# Patient Record
Sex: Male | Born: 1945 | Race: White | Hispanic: No | Marital: Married | Smoking: Former smoker
Health system: Southern US, Community
[De-identification: ages and names within clinical notes are randomized; demographics above are authoritative.]

## PROBLEM LIST (undated history)

## (undated) DIAGNOSIS — I350 Nonrheumatic aortic (valve) stenosis: Secondary | ICD-10-CM

## (undated) DIAGNOSIS — E669 Obesity, unspecified: Secondary | ICD-10-CM

## (undated) DIAGNOSIS — R011 Cardiac murmur, unspecified: Secondary | ICD-10-CM

## (undated) DIAGNOSIS — E7800 Pure hypercholesterolemia, unspecified: Secondary | ICD-10-CM

## (undated) DIAGNOSIS — Z0000 Encounter for general adult medical examination without abnormal findings: Secondary | ICD-10-CM

## (undated) DIAGNOSIS — L821 Other seborrheic keratosis: Secondary | ICD-10-CM

## (undated) DIAGNOSIS — H9313 Tinnitus, bilateral: Secondary | ICD-10-CM

## (undated) DIAGNOSIS — I447 Left bundle-branch block, unspecified: Secondary | ICD-10-CM

## (undated) DIAGNOSIS — D696 Thrombocytopenia, unspecified: Secondary | ICD-10-CM

## (undated) DIAGNOSIS — L03116 Cellulitis of left lower limb: Secondary | ICD-10-CM

## (undated) DIAGNOSIS — G459 Transient cerebral ischemic attack, unspecified: Secondary | ICD-10-CM

## (undated) DIAGNOSIS — I10 Essential (primary) hypertension: Secondary | ICD-10-CM

## (undated) DIAGNOSIS — M4820 Kissing spine, site unspecified: Secondary | ICD-10-CM

## (undated) DIAGNOSIS — R600 Localized edema: Secondary | ICD-10-CM

## (undated) DIAGNOSIS — Z23 Encounter for immunization: Secondary | ICD-10-CM

## (undated) DIAGNOSIS — M545 Low back pain: Secondary | ICD-10-CM

## (undated) DIAGNOSIS — I872 Venous insufficiency (chronic) (peripheral): Secondary | ICD-10-CM

## (undated) HISTORY — DX: Encounter for immunization: Z23

## (undated) HISTORY — DX: Left bundle-branch block, unspecified: I44.7

## (undated) HISTORY — DX: Essential (primary) hypertension: I10

## (undated) HISTORY — DX: Transient cerebral ischemic attack, unspecified: G45.9

## (undated) HISTORY — DX: Encounter for general adult medical examination without abnormal findings: Z00.00

## (undated) HISTORY — DX: Tinnitus, bilateral: H93.13

## (undated) HISTORY — PX: ESOPHAGOGASTRODUODENOSCOPY: SHX1529

## (undated) HISTORY — DX: Venous insufficiency (chronic) (peripheral): I87.2

## (undated) HISTORY — DX: Thrombocytopenia, unspecified: D69.6

## (undated) HISTORY — DX: Other seborrheic keratosis: L82.1

## (undated) HISTORY — DX: Pure hypercholesterolemia, unspecified: E78.00

## (undated) HISTORY — PX: CORONARY ANGIOPLASTY: SHX604

## (undated) HISTORY — DX: Cardiac murmur, unspecified: R01.1

## (undated) HISTORY — PX: CHOLECYSTECTOMY: SHX55

## (undated) HISTORY — DX: Cellulitis of left lower limb: L03.116

## (undated) HISTORY — DX: Localized edema: R60.0

## (undated) HISTORY — DX: Kissing spine, site unspecified: M48.20

## (undated) HISTORY — DX: Low back pain: M54.5

## (undated) HISTORY — DX: Nonrheumatic aortic (valve) stenosis: I35.0

## (undated) HISTORY — DX: Obesity, unspecified: E66.9

## (undated) HISTORY — PX: CARDIAC CATHETERIZATION: SHX172

---

## 2015-05-24 DIAGNOSIS — E7800 Pure hypercholesterolemia, unspecified: Secondary | ICD-10-CM

## 2015-05-24 HISTORY — DX: Pure hypercholesterolemia, unspecified: E78.00

## 2015-08-10 DIAGNOSIS — I350 Nonrheumatic aortic (valve) stenosis: Secondary | ICD-10-CM | POA: Insufficient documentation

## 2015-08-10 DIAGNOSIS — E66812 Obesity, class 2: Secondary | ICD-10-CM

## 2015-08-10 DIAGNOSIS — I872 Venous insufficiency (chronic) (peripheral): Secondary | ICD-10-CM

## 2015-08-10 DIAGNOSIS — I351 Nonrheumatic aortic (valve) insufficiency: Secondary | ICD-10-CM

## 2015-08-10 DIAGNOSIS — E669 Obesity, unspecified: Secondary | ICD-10-CM

## 2015-08-10 HISTORY — DX: Obesity, class 2: E66.812

## 2015-08-10 HISTORY — DX: Venous insufficiency (chronic) (peripheral): I87.2

## 2015-08-10 HISTORY — DX: Nonrheumatic aortic (valve) stenosis: I35.0

## 2015-08-10 HISTORY — DX: Nonrheumatic aortic (valve) insufficiency: I35.1

## 2015-08-10 HISTORY — DX: Obesity, unspecified: E66.9

## 2015-08-13 DIAGNOSIS — I10 Essential (primary) hypertension: Secondary | ICD-10-CM

## 2015-08-13 HISTORY — DX: Essential (primary) hypertension: I10

## 2015-08-22 DIAGNOSIS — M545 Low back pain: Secondary | ICD-10-CM | POA: Insufficient documentation

## 2015-08-22 DIAGNOSIS — M4820 Kissing spine, site unspecified: Secondary | ICD-10-CM

## 2015-08-22 DIAGNOSIS — M5459 Other low back pain: Secondary | ICD-10-CM | POA: Insufficient documentation

## 2015-08-22 DIAGNOSIS — R600 Localized edema: Secondary | ICD-10-CM

## 2015-08-22 DIAGNOSIS — I447 Left bundle-branch block, unspecified: Secondary | ICD-10-CM | POA: Insufficient documentation

## 2015-08-22 DIAGNOSIS — H9313 Tinnitus, bilateral: Secondary | ICD-10-CM

## 2015-08-22 HISTORY — DX: Localized edema: R60.0

## 2015-08-22 HISTORY — DX: Kissing spine, site unspecified: M48.20

## 2015-08-22 HISTORY — DX: Other low back pain: M54.59

## 2015-08-22 HISTORY — DX: Tinnitus, bilateral: H93.13

## 2015-08-22 HISTORY — DX: Left bundle-branch block, unspecified: I44.7

## 2016-06-02 DIAGNOSIS — Z0000 Encounter for general adult medical examination without abnormal findings: Secondary | ICD-10-CM

## 2016-06-02 HISTORY — DX: Encounter for general adult medical examination without abnormal findings: Z00.00

## 2016-12-19 DIAGNOSIS — G459 Transient cerebral ischemic attack, unspecified: Secondary | ICD-10-CM | POA: Insufficient documentation

## 2016-12-19 HISTORY — DX: Transient cerebral ischemic attack, unspecified: G45.9

## 2017-03-27 DIAGNOSIS — Z23 Encounter for immunization: Secondary | ICD-10-CM

## 2017-03-27 DIAGNOSIS — L821 Other seborrheic keratosis: Secondary | ICD-10-CM

## 2017-03-27 HISTORY — DX: Other seborrheic keratosis: L82.1

## 2017-03-27 HISTORY — DX: Encounter for immunization: Z23

## 2017-04-29 DIAGNOSIS — R011 Cardiac murmur, unspecified: Secondary | ICD-10-CM | POA: Insufficient documentation

## 2017-04-29 DIAGNOSIS — L03116 Cellulitis of left lower limb: Secondary | ICD-10-CM

## 2017-04-29 DIAGNOSIS — D696 Thrombocytopenia, unspecified: Secondary | ICD-10-CM

## 2017-04-29 HISTORY — DX: Cardiac murmur, unspecified: R01.1

## 2017-04-29 HISTORY — DX: Thrombocytopenia, unspecified: D69.6

## 2017-04-29 HISTORY — DX: Cellulitis of left lower limb: L03.116

## 2017-05-04 ENCOUNTER — Encounter: Payer: Self-pay | Admitting: Cardiology

## 2017-05-04 ENCOUNTER — Ambulatory Visit: Payer: Medicare Other | Admitting: Cardiology

## 2017-05-04 VITALS — BP 150/86 | HR 53 | Ht 69.0 in | Wt 262.0 lb

## 2017-05-04 DIAGNOSIS — R0609 Other forms of dyspnea: Secondary | ICD-10-CM

## 2017-05-04 DIAGNOSIS — R011 Cardiac murmur, unspecified: Secondary | ICD-10-CM

## 2017-05-04 DIAGNOSIS — E785 Hyperlipidemia, unspecified: Secondary | ICD-10-CM

## 2017-05-04 DIAGNOSIS — R001 Bradycardia, unspecified: Secondary | ICD-10-CM

## 2017-05-04 DIAGNOSIS — I10 Essential (primary) hypertension: Secondary | ICD-10-CM | POA: Diagnosis not present

## 2017-05-04 DIAGNOSIS — I447 Left bundle-branch block, unspecified: Secondary | ICD-10-CM

## 2017-05-04 DIAGNOSIS — R0600 Dyspnea, unspecified: Secondary | ICD-10-CM

## 2017-05-04 HISTORY — DX: Dyspnea, unspecified: R06.00

## 2017-05-04 HISTORY — DX: Other forms of dyspnea: R06.09

## 2017-05-04 MED ORDER — PRAVASTATIN SODIUM 20 MG PO TABS: 20.0000 mg | ORAL_TABLET | Freq: Every evening | ORAL | 3 refills | Status: DC

## 2017-05-04 MED ORDER — CARVEDILOL 12.5 MG PO TABS: 6.2500 mg | ORAL_TABLET | Freq: Two times a day (BID) | ORAL | 3 refills | Status: DC

## 2017-05-04 NOTE — Progress Notes (Signed)
Cardiology Office Note:    Date:  05/04/2017   ID:  Craig Lawson, DOB [birth year], MRN [id number]  PCP:  Tarri Fuller, MD  Cardiologist:  Norman Herrlich, MD   Referring MD: Tarri Fuller, MD  ASSESSMENT:    1. Murmur   2. Bradycardia   3. LBBB (left bundle branch block)   4. Essential hypertension   5. DOE (dyspnea on exertion)   6. Hyperlipidemia, unspecified hyperlipidemia type    PLAN:    In order of problems listed above:  1. His exam is consistent with aortic stenosis moderate and there is discordance on the echocardiogram 2016 mild stenosis in September 2018 no aortic stenosis.  Because of symptoms he will undergo repeat echocardiogram. 2. He is symptomatic with exercise intolerance he has resting bradycardia heart rate of 49 his beta-blocker dose will be decreased by 50% and plan a Holter monitor in 2 weeks to assess heart rate.  If he remains bradycardic we will need to consider discontinuing a beta-blocker 3. At risk for left ventricular dysfunction CAD echocardiogram myocardial perfusion study ordered. 4. Discontinue Coreg 5. Ischemia evaluation is ordered with his exertional shortness of breath and exercise intolerance 6. I will switch him to a low-dose of a low intensity statin pravastatin daily with his severe residual elevated LDL on once weekly low-dose atorvastatin.  Next appointment 4 weeks   Medication Adjustments/Labs and Tests Ordered: Current medicines are reviewed at length with the patient today.  Concerns regarding medicines are outlined above.  Orders Placed This Encounter  Procedures  . Myocardial Perfusion Imaging  . Holter monitor - 48 hour  . EKG 12-Lead  . ECHOCARDIOGRAM COMPLETE   Meds ordered this encounter  Medications  . carvedilol (COREG) 12.5 MG tablet    Sig: Take 0.5 tablets (6.25 mg total) by mouth 2 (two) times daily with a meal.    Dispense:  60 tablet    Refill:  3  . pravastatin (PRAVACHOL) 20 MG tablet    Sig: Take 1  tablet (20 mg total) by mouth every evening.    Dispense:  30 tablet    Refill:  3     Chief Complaint  Patient presents with  . Establish Care    Aortic regurgitation  4 weeks  History of Present Illness:    Craig Lawson is a 72 y.o. male with a history of hypertension, LBBB and mild AR who is being seen today for the evaluation of valvular disease at the request of Tarri Fuller, MD.His echo in 2016 showed AV gradient, mild AS  He relates that he had a home visit with a nurse practitioner through his insurance she commented that his heart rate was slow and that his oxygen saturation was 90%.  He previously seen a cardiologist at [facility] 2016 and 2017 for mild aortic stenosis.  An echocardiogram in the last year at [facility] showed no gradient.  He relates he short of breath or more than usual activities such as climbing stairs but no edema orthopnea PND chest pain no recent palpitation or syncope.  He was admitted to the hospital in September for what was called TIA with transient aphasia and relates that afterwards he had a brief episode with dizziness that was similar but was brief and did not seek medical attention.  He has no history of congenital or rheumatic heart disease he thinks about 10 years ago he had a stress test performed Past Medical History:  Diagnosis Date  .  Aortic stenosis, mild 08/10/2015  . Bilateral leg edema 08/22/2015  . Cellulitis of left lower extremity 04/29/2017  . Essential hypertension 08/13/2015  . Heart murmur 04/29/2017  . Kissing, osteophytes 08/22/2015  . LBBB (left bundle branch block) 08/22/2015  . Lumbar facet joint pain 08/22/2015  . Medicare annual wellness visit, subsequent 06/02/2016  . Need for vaccination for Strep pneumoniae 03/27/2017  . Obesity, Class II, BMI 35-39.9 08/10/2015  . Pure hypercholesterolemia 05/24/2015  . Seborrheic keratoses 03/27/2017   Overview:  Of the right forehead.  . Thrombocytopenia (HCC) 04/29/2017    . TIA (transient ischemic attack) 12/19/2016  . 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 Venous insufficiency 08/10/2015    Past Surgical History:  Procedure Laterality Date  . CHOLECYSTECTOMY    . ESOPHAGOGASTRODUODENOSCOPY      Current Medications: Current Meds  Medication Sig  . aspirin 325 MG tablet Take by mouth.  . carvedilol (COREG) 12.5 MG tablet Take 0.5 tablets (6.25 mg total) by mouth 2 (two) times daily with a meal.  . hydrochlorothiazide (HYDRODIURIL) 25 MG tablet Take 25 mg by mouth.  . Multiple Vitamin (MULTIVITAMIN) tablet Take 1 tablet by mouth daily.  . [DISCONTINUED] atorvastatin (LIPITOR) 10 MG tablet Take one statin tab by mouth weekly.  . [DISCONTINUED] carvedilol (COREG) 12.5 MG tablet Take by mouth.     Allergies:   Statins he was unable to tolerate a high dose of a high intensity statin  Social History   Socioeconomic History  . Marital status: Married    Spouse name: None  . Number of children: None  . Years of education: None  . Highest education level: None  Social Needs  . Financial resource strain: None  . Food insecurity - worry: None  . Food insecurity - inability: None  . Transportation needs - medical: None  . Transportation needs - non-medical: None  Occupational History  . None  Tobacco Use  . Smoking status: Former Games developermoker  . Smokeless tobacco: Never Used  Substance and Sexual Activity  . Alcohol use: Yes    Alcohol/week: 4.2 oz    Types: 7 Glasses of wine per week  . Drug use: No  . Sexual activity: None  Other Topics Concern  . None  Social History Narrative  . None     Family History: The patient's family history includes Cancer in his mother; Diabetes in his brother and sister; Hypertension in his brother.  ROS:   Review of Systems  Constitution: Negative.  HENT: Negative.   Eyes: Negative.   Cardiovascular: Positive for dyspnea on exertion.  Respiratory: Positive for shortness of breath.   Endocrine: Negative.    Hematologic/Lymphatic: Negative.   Skin: Negative.   Musculoskeletal: Positive for back pain.  Gastrointestinal: Negative.   Genitourinary: Negative.   Neurological: Negative.   Psychiatric/Behavioral: Negative.   Allergic/Immunologic: Negative.    Please see the history of present illness.     All other systems reviewed and are negative.  EKGs/Labs/Other Studies Reviewed:    The following studies were reviewed today:  Echo TTE 12/20/16 EF 50-55% mild AR. AV Peak Velocity: 136 cm  Carotid duplex  12/19/16: Summary  1-39% stenosis bilaterally in the internal carotid arteries. No significant plaque noted.  MRI brain 12/19/16:  Result Impression  1. No acute intracranial infarct or other process identified. 2. Mild cerebral white matter changes, most likely related to mild chronic small vessel ischemic disease.    EKG:  EKG is  ordered today.  The  ekg ordered today demonstrates SRTH 49 BPM LBBB  Recent Labs: Bili 1.4, CMP otherwise nornmal No results found for requested labs within last 8760 hours.  Recent Lipid Panel 03/27/17: Chol 208, HDL 37, LDL 171 No results found for: CHOL, TRIG, HDL, CHOLHDL, VLDL, LDLCALC, LDLDIRECT  Physical Exam:    VS:  BP (!) 150/86 (BP Location: Left Arm, Patient Position: Sitting, Cuff Size: Normal)   Pulse (!) 53   Ht 5\' 9"  (1.753 m)   Wt 262 lb (118.8 kg)   SpO2 98%   BMI 38.69 kg/m     Wt Readings from Last 3 Encounters:  05/04/17 262 lb (118.8 kg)     GEN:  Well nourished, well developed in no acute distress HEENT: Normal NECK: No JVD; No carotid bruits LYMPHATICS: No lymphadenopathy CARDIAC: 3/6 SEM mid peaking, S2 split, rumble, radiated to carotids bilaterally no AR and full carotid upstroke RRR, no murmurs, rubs, gallops RESPIRATORY:  Clear to auscultation without rales, wheezing or rhonchi  ABDOMEN: Soft, non-tender, non-distended MUSCULOSKELETAL:  No edema; No deformity  SKIN: Warm and dry NEUROLOGIC:  Alert and  oriented x 3 PSYCHIATRIC:  Normal affect     Signed, Norman Herrlich, MD  05/04/2017 2:30 PM    [facility]

## 2017-05-04 NOTE — Patient Instructions (Addendum)
Medication Instructions:  Your physician has recommended you make the following change in your medication:  STOP atorvastatin START pravastatin 20 mg daily  DECREASE carvedilol to 6.25 mg twice daily  Labwork: None  Testing/Procedures: You had an EKG today.  Your physician has requested that you have an echocardiogram. Echocardiography is a painless test that uses sound waves to create images of your heart. It provides your doctor with information about the size and shape of your heart and how well your heart's chambers and valves are working. This procedure takes approximately one hour. There are no restrictions for this procedure.  Your physician has recommended that you wear a holter monitor. Holter monitors are medical devices that record the heart's electrical activity. Doctors most often use these monitors to diagnose arrhythmias. Arrhythmias are problems with the speed or rhythm of the heartbeat. The monitor is a small, portable device. You can wear one while you do your normal daily activities. This is usually used to diagnose what is causing palpitations/syncope (passing out). 48 hours.  Your physician has requested that you have a lexiscan myoview. For further information please visit [email or web address]. Please follow instruction sheet, as given.  Follow-Up: Your physician recommends that you schedule a follow-up appointment in: 4 weeks.  Any Other Special Instructions Will Be Listed Below (If Applicable).     If you need a refill on your cardiac medications before your next appointment, please call your pharmacy.

## 2017-05-11 ENCOUNTER — Telehealth (HOSPITAL_COMMUNITY): Payer: Self-pay | Admitting: *Deleted

## 2017-05-11 NOTE — Telephone Encounter (Signed)
Patient given detailed instructions per Myocardial Perfusion Study Information Sheet for the test on 05/13/17 at 0945. Patient notified to arrive 15 minutes early and that it is imperative to arrive on time for appointment to keep from having the test rescheduled.  If you need to cancel or reschedule your appointment, please call the office within 24 hours of your appointment. . Patient verbalized understanding.Craig Lawson    

## 2017-05-13 ENCOUNTER — Other Ambulatory Visit: Payer: Self-pay

## 2017-05-13 ENCOUNTER — Ambulatory Visit (HOSPITAL_COMMUNITY): Payer: Medicare Other | Attending: Cardiology

## 2017-05-13 ENCOUNTER — Ambulatory Visit (HOSPITAL_BASED_OUTPATIENT_CLINIC_OR_DEPARTMENT_OTHER): Payer: Medicare Other

## 2017-05-13 VITALS — Ht 69.0 in | Wt 262.0 lb

## 2017-05-13 DIAGNOSIS — R011 Cardiac murmur, unspecified: Secondary | ICD-10-CM | POA: Diagnosis not present

## 2017-05-13 DIAGNOSIS — R0609 Other forms of dyspnea: Secondary | ICD-10-CM

## 2017-05-13 DIAGNOSIS — R079 Chest pain, unspecified: Secondary | ICD-10-CM

## 2017-05-13 DIAGNOSIS — I10 Essential (primary) hypertension: Secondary | ICD-10-CM

## 2017-05-13 DIAGNOSIS — I350 Nonrheumatic aortic (valve) stenosis: Secondary | ICD-10-CM | POA: Insufficient documentation

## 2017-05-13 LAB — MYOCARDIAL PERFUSION IMAGING
LV dias vol: 182 mL (ref 62–150)
LV sys vol: 111 mL
Peak HR: 65 {beats}/min
RATE: 0.34
Rest HR: 47 {beats}/min
SDS: 4
SRS: 5
SSS: 9
TID: 1.06

## 2017-05-13 LAB — ECHOCARDIOGRAM COMPLETE
HEIGHTINCHES: 69 in
WEIGHTICAEL: 4192 [oz_av]

## 2017-05-13 MED ORDER — TECHNETIUM TC 99M TETROFOSMIN IV KIT
32.7000 | PACK | Freq: Once | INTRAVENOUS | Status: AC | PRN
  Administered 2017-05-13: 32.7 via INTRAVENOUS
  Filled 2017-05-13: qty 33

## 2017-05-13 MED ORDER — REGADENOSON 0.4 MG/5ML IV SOLN
0.4000 mg | Freq: Once | INTRAVENOUS | Status: AC
  Administered 2017-05-13: 0.4 mg via INTRAVENOUS

## 2017-05-13 MED ORDER — TECHNETIUM TC 99M TETROFOSMIN IV KIT
11.0000 | PACK | Freq: Once | INTRAVENOUS | Status: AC | PRN
  Administered 2017-05-13: 11 via INTRAVENOUS
  Filled 2017-05-13: qty 11

## 2017-05-18 ENCOUNTER — Ambulatory Visit: Payer: Medicare Other

## 2017-05-18 DIAGNOSIS — R001 Bradycardia, unspecified: Secondary | ICD-10-CM

## 2017-05-31 NOTE — Progress Notes (Signed)
Cardiology Office Note:    Date:  06/01/2017   ID:  [name], DOB 08/19/1945, MRN [id number]  PCP:  Tarri Fuller, MD  Cardiologist:  Norman Herrlich, MD    Referring MD: Tarri Fuller, MD    ASSESSMENT:    1. Bradycardia   2. Mild aortic stenosis   3. Mild aortic regurgitation   4. Essential hypertension   5. LBBB (left bundle branch block)    PLAN:    In order of problems listed above:  1. His Holter monitor confirms relative bradycardia he has malaise fatigue exercise intolerance 1 minute he has begin withdrawing carvedilol and switching to alternative therapy for hypertension.  In the past he relates blood pressure is controlled and felt well and ACE inhibitor prior to a cough.  He was start a high intensity ARB return for BP check and BMP in 1 week see me in 4 weeks.   2. Stable on recent echocardiogram 3. Stable and recent echocardiogram 4. Poorly controlled, continue HCTZ add ARB check BP and blood pressure in 1 week and attempt to withdraw and reduce his beta-blocker with malaise fatigue and exercise intolerance 5. Stable his myocardial perfusion study shows no ischemia echocardiogram shows normal left ventricular function.   Next appointment: 4 weeks   Medication Adjustments/Labs and Tests Ordered: Current medicines are reviewed at length with the patient today.  Concerns regarding medicines are outlined above.  No orders of the defined types were placed in this encounter.  No orders of the defined types were placed in this encounter.   Chief Complaint  Patient presents with  . Follow-up    after cardiac testing  . Bradycardia    LBBB AS and AR    History of Present Illness:    Craig Lawson is a 72 y.o. male with a hx of hyperlipidemia, hypertension, LBBB, sinus bradycardia  and mild As and mild AR last seen one month ago.. Compliance with diet, lifestyle and medications: Yes He continues to have malaise fatigue exercise intolerance and shortness of  breath when he walks more than a quarter of a mile.  Previously did well with lisinopril but developed a cough will continue to decrease and try to just discontinue his beta-blocker at a high intensity ARB for blood pressure control recheck blood pressure BMP in 1 week see me in 4 weeks and then afterwards of hypertension is at target and feeling well see yearly for his mild aortic valve disease.  He is intolerant of statins including low intensity with muscle pain and weakness. Past Medical History:  Diagnosis Date  . Aortic stenosis, mild 08/10/2015  . Bilateral leg edema 08/22/2015  . Cellulitis of left lower extremity 04/29/2017  . Essential hypertension 08/13/2015  . Heart murmur 04/29/2017  . Kissing, osteophytes 08/22/2015  . LBBB (left bundle branch block) 08/22/2015  . Lumbar facet joint pain 08/22/2015  . Medicare annual wellness visit, subsequent 06/02/2016  . Need for vaccination for Strep pneumoniae 03/27/2017  . Obesity, Class II, BMI 35-39.9 08/10/2015  . Pure hypercholesterolemia 05/24/2015  . Seborrheic keratoses 03/27/2017   Overview:  Of the right forehead.  . Thrombocytopenia (HCC) 04/29/2017  . TIA (transient ischemic attack) 12/19/2016  . 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 Venous insufficiency 08/10/2015    Past Surgical History:  Procedure Laterality Date  . CHOLECYSTECTOMY    . ESOPHAGOGASTRODUODENOSCOPY      Current Medications: Current Meds  Medication Sig  . aspirin 325 MG tablet Take 325 mg by mouth  daily.   . carvedilol (COREG) 12.5 MG tablet Take 0.5 tablets (6.25 mg total) by mouth 2 (two) times daily with a meal.  . hydrochlorothiazide (HYDRODIURIL) 25 MG tablet Take 25 mg by mouth daily.   . Multiple Vitamin (MULTIVITAMIN) tablet Take 1 tablet by mouth daily.     Allergies:   Statins   Social History   Socioeconomic History  . Marital status: Married    Spouse name: None  . Number of children: None  . Years of education: None  . Highest education level: None    Social Needs  . Financial resource strain: None  . Food insecurity - worry: None  . Food insecurity - inability: None  . Transportation needs - medical: None  . Transportation needs - non-medical: None  Occupational History  . None  Tobacco Use  . Smoking status: Former Games developer  . Smokeless tobacco: Never Used  Substance and Sexual Activity  . Alcohol use: Yes    Alcohol/week: 4.2 oz    Types: 7 Glasses of wine per week  . Drug use: No  . Sexual activity: None  Other Topics Concern  . None  Social History Narrative  . None     Family History: The patient's family history includes Cancer in his mother; Diabetes in his brother and sister; Hypertension in his brother. ROS:   Please see the history of present illness.    All other systems reviewed and are negative.  EKGs/Labs/Other Studies Reviewed:    The following studies were reviewed today:  Echo TTE 05/13/17:  - Left ventricle: The cavity size was normal. Wall thickness was   increased in a pattern of moderate LVH. Systolic function was   normal. The estimated ejection fraction was in the range of 60%   to 65%. Wall motion was normal; there were no regional wall   motion abnormalities. Doppler parameters are consistent with   abnormal left ventricular relaxation (grade 1 diastolic   dysfunction). - Aortic valve: Mildly to moderately calcified annulus. Moderately   thickened, moderately calcified leaflets. There was mild   stenosis. There was trivial regurgitation.  MPI:  Perfusion Summary Small anterseptal infarct in setting of LBBB no ischemia Diffuse hypokinesis EF estimated at 39% suggest echo/MRI correlation  Overall Study Impression Myocardial perfusion is abnormal. This is an intermediate risk study. Overall left ventricular systolic function was abnormal. LV cavity size is mildly enlarged. Nuclear stress EF: 39%. The left ventricular ejection fraction is moderately decreased (30-44%).    Holter  monitor: Date of test:                 05/18/2017 Duration of test:           48 hours Indication:                    Bradycardia Ordering physician:       Dr. Watt Climes Interpreting physician:   Dr. Dulce Sellar Baseline rhythm: Sinus rhythm wide QRS 160 ms appears left bundle branch block Minimum heart rate 43 average heart rate 53 maximal heart rate 95 bpm Atrial arrhythmia: 309 single APCs are seen with 3 brief runs and rates of 90-104 bpm the longest 10 beats in duration.  There were no episodes of atrial fibrillation Ventricular arrhythmia: 28 single PVCs Conduction abnormality: Sinus bradycardia slowest sustained rate 46 bpm.  There are no episodes of sinus node or AV nodal block QT interval: Normal 467 ms Symptoms: None Conclusion: Sinus bradycardia left bundle branch block  and occasional atrial and ventricular premature beats.  Recent Labs: No results found for requested labs within last 8760 hours.  Recent Lipid Panel No results found for: CHOL, TRIG, HDL, CHOLHDL, VLDL, LDLCALC, LDLDIRECT  Physical Exam:    VS:  BP (!) 168/92 (BP Location: Right Arm, Patient Position: Sitting, Cuff Size: Large)   Pulse (!) 51   Ht 5\' 9"  (1.753 m)   Wt 260 lb (117.9 kg)   SpO2 97%   BMI 38.40 kg/m     Wt Readings from Last 3 Encounters:  06/01/17 260 lb (117.9 kg)  05/13/17 262 lb (118.8 kg)  05/04/17 262 lb (118.8 kg)     GEN:  Well nourished, well developed in no acute distress HEENT: Normal NECK: No JVD; No carotid bruits LYMPHATICS: No lymphadenopathy CARDIAC: RRR, no murmurs, rubs, gallops RESPIRATORY:  Clear to auscultation without rales, wheezing or rhonchi  ABDOMEN: Soft, non-tender, non-distended MUSCULOSKELETAL:  No edema; No deformity  SKIN: Warm and dry NEUROLOGIC:  Alert and oriented x 3 PSYCHIATRIC:  Normal affect    Signed, Norman HerrlichBrian Feras Gardella, MD  06/01/2017 4:35 PM    [facility]

## 2017-06-01 ENCOUNTER — Ambulatory Visit: Payer: Medicare Other | Admitting: Cardiology

## 2017-06-01 ENCOUNTER — Encounter: Payer: Self-pay | Admitting: Cardiology

## 2017-06-01 VITALS — BP 168/92 | HR 51 | Ht 69.0 in | Wt 260.0 lb

## 2017-06-01 DIAGNOSIS — I350 Nonrheumatic aortic (valve) stenosis: Secondary | ICD-10-CM | POA: Insufficient documentation

## 2017-06-01 DIAGNOSIS — I351 Nonrheumatic aortic (valve) insufficiency: Secondary | ICD-10-CM

## 2017-06-01 DIAGNOSIS — I447 Left bundle-branch block, unspecified: Secondary | ICD-10-CM | POA: Diagnosis not present

## 2017-06-01 DIAGNOSIS — R001 Bradycardia, unspecified: Secondary | ICD-10-CM | POA: Insufficient documentation

## 2017-06-01 DIAGNOSIS — I10 Essential (primary) hypertension: Secondary | ICD-10-CM | POA: Diagnosis not present

## 2017-06-01 HISTORY — DX: Nonrheumatic aortic (valve) stenosis: I35.0

## 2017-06-01 HISTORY — DX: Bradycardia, unspecified: R00.1

## 2017-06-01 MED ORDER — TELMISARTAN 40 MG PO TABS: 40.0000 mg | ORAL_TABLET | Freq: Every day | ORAL | 2 refills | Status: DC

## 2017-06-01 MED ORDER — CARVEDILOL 12.5 MG PO TABS: 6.2500 mg | ORAL_TABLET | Freq: Every day | ORAL | 3 refills | Status: DC

## 2017-06-01 NOTE — Patient Instructions (Signed)
Medication Instructions:  Your physician has recommended you make the following change in your medication:  Reduce: carvedilol 12.5mg  to 0.5 tablet daily=6.25mg  daily Start: Telmisartan 40mg  one tablet daily  Labwork: Your physician recommends that you return for lab work in: 1 week-BMP   Testing/Procedures: NONE  Follow-Up: Your physician recommends that you schedule a follow-up appointment in: 4 weeks  Return to office for BP check and BMP in one week.   Any Other Special Instructions Will Be Listed Below (If Applicable).     If you need a refill on your cardiac medications before your next appointment, please call your pharmacy.

## 2017-06-01 NOTE — Addendum Note (Signed)
Addended by: Lamona CurlOUTH, Umar Patmon H on: 06/01/2017 04:54 PM   Modules accepted: Orders

## 2017-06-08 ENCOUNTER — Ambulatory Visit (INDEPENDENT_AMBULATORY_CARE_PROVIDER_SITE_OTHER): Payer: Medicare Other | Admitting: Cardiology

## 2017-06-08 VITALS — BP 152/82 | HR 59

## 2017-06-08 DIAGNOSIS — I10 Essential (primary) hypertension: Secondary | ICD-10-CM | POA: Diagnosis not present

## 2017-06-08 NOTE — Progress Notes (Signed)
Pt comes to the office today for BP and BMP check per Dr Dulce SellarMunley after recent medication changes.  Pt was advised on 06-01-17 to reduce carvedilol 12.5 mg one tablet everyday to 6.25mg  0.5 tablet daily and to start telmisartan 40mg  one tablet daily. Pt has been checking his BP at home, and states that he hasn't seen much improvement since medication changes have been made.  Pt does not have any complaints at the time of appt.   Per Dr Kirtland BouchardK pt should have BMP drawn today and if labs are good he will increase telmisartan. Pt verbalized understanding.

## 2017-06-09 ENCOUNTER — Encounter: Payer: Self-pay | Admitting: Cardiology

## 2017-06-09 LAB — BASIC METABOLIC PANEL
BUN/Creatinine Ratio: 14 (ref 10–24)
BUN: 12 mg/dL (ref 8–27)
CALCIUM: 9.5 mg/dL (ref 8.6–10.2)
CHLORIDE: 97 mmol/L (ref 96–106)
CO2: 26 mmol/L (ref 20–29)
Creatinine, Ser: 0.86 mg/dL (ref 0.76–1.27)
GFR calc Af Amer: 100 mL/min/{1.73_m2} (ref >59)
GFR, EST NON AFRICAN AMERICAN: 87 mL/min/{1.73_m2} (ref >59)
Glucose: 97 mg/dL (ref 65–99)
POTASSIUM: 4.3 mmol/L (ref 3.5–5.2)
Sodium: 139 mmol/L (ref 134–144)

## 2017-06-10 ENCOUNTER — Telehealth: Payer: Self-pay

## 2017-06-10 MED ORDER — TELMISARTAN 40 MG PO TABS: 40.0000 mg | ORAL_TABLET | Freq: Two times a day (BID) | ORAL | 2 refills | Status: DC

## 2017-06-10 NOTE — Telephone Encounter (Signed)
Pt notified of normal lab results.  Pt advised to increase telmisartan 40mg  to 80mg  daily.  Pt will monitor BP at home, and keep flup appt as planned for 06-29-17.  Pt verbalized understanding.

## 2017-06-29 ENCOUNTER — Encounter: Payer: Self-pay | Admitting: Cardiology

## 2017-06-29 ENCOUNTER — Ambulatory Visit: Payer: Medicare Other | Admitting: Cardiology

## 2017-06-29 VITALS — BP 150/70 | HR 76 | Ht 69.0 in | Wt 261.1 lb

## 2017-06-29 DIAGNOSIS — I10 Essential (primary) hypertension: Secondary | ICD-10-CM | POA: Diagnosis not present

## 2017-06-29 DIAGNOSIS — R001 Bradycardia, unspecified: Secondary | ICD-10-CM | POA: Diagnosis not present

## 2017-06-29 MED ORDER — DILTIAZEM HCL ER COATED BEADS 240 MG PO CP24: 240.0000 mg | ORAL_CAPSULE | Freq: Every day | ORAL | 3 refills | Status: DC

## 2017-06-29 NOTE — Progress Notes (Signed)
Cardiology Office Note:    Date:  06/29/2017   ID:  Craig Lawson, DOB 10/07/1945, MRN [id number]  PCP:  Tarri FullerEscajeda, Richard, MD  Cardiologist:  Norman HerrlichBrian Rosaline Ezekiel, MD    Referring MD: Tarri FullerEscajeda, Richard, MD    ASSESSMENT:    1. Essential hypertension   2. Bradycardia    PLAN:    In order of problems listed above:  1. BP is not at target on withdrawal carvedilol with his bradycardia weakness place him on low-dose Cardizem continue to monitor home blood pressure and heart rate and plan to see back in the office in 1 year and follow-up for his valvular heart disease. 2. Improved heart rates are greater than 55-60 bpm   Next appointment: One year   Medication Adjustments/Labs and Tests Ordered: Current medicines are reviewed at length with the patient today.  Concerns regarding medicines are outlined above.  No orders of the defined types were placed in this encounter.  No orders of the defined types were placed in this encounter.   Chief Complaint  Patient presents with  . Hypertension  . Follow-up    with bradycardia    History of Present Illness:    Craig Lawson is a 72 y.o. male with a hx of hyperlipidemia, hypertension, LBBB, sinus bradycardia  and mild As and mild AR last seen 06/01/17.  ASSESSMENT:    06/01/17   1. Bradycardia   2. Mild aortic stenosis   3. Mild aortic regurgitation   4. Essential hypertension   5. LBBB (left bundle branch block)    PLAN:    1.   His Holter monitor confirms relative bradycardia he has malaise fatigue exercise intolerance and he has begin withdrawing carvedilol and switching to alternative therapy for hypertension.  In the past he relates blood pressure is controlled and felt well and ACE inhibitor prior to a cough.  He was start a high intensity ARB return for BP check and BMP in 1 week see me in 4 weeks.   3. Stable on recent echocardiogram 4. Stable and recent echocardiogram 5. Poorly controlled, continue HCTZ add ARB check BP and  blood pressure in 1 week and attempt to withdraw and reduce his beta-blocker with malaise fatigue and exercise intolerance 6. Stable his myocardial perfusion study shows no ischemia echocardiogram shows normal left ventricular function.   Compliance with diet, lifestyle and medications: yes Some blood pressures with a small cuff which is 8 mmHg higher than a large cuff in my office runs 150-160.  He feels improved heart rates have been above 55-60 bpm and will plan to discontinue Cardizem and place him on a calcium channel blocker in the fast past he could not tolerate amlodipine with edema and will use a low-dose Cardizem which is rate less sinus node suppressant  THAN carvedilol.  He has not had exercise intolerance chest pain palpitation or syncope. Past Medical History:  Diagnosis Date  . Aortic stenosis, mild 08/10/2015  . Bilateral leg edema 08/22/2015  . Cellulitis of left lower extremity 04/29/2017  . Essential hypertension 08/13/2015  . Heart murmur 04/29/2017  . Kissing, osteophytes 08/22/2015  . LBBB (left bundle branch block) 08/22/2015  . Lumbar facet joint pain 08/22/2015  . Medicare annual wellness visit, subsequent 06/02/2016  . Need for vaccination for Strep pneumoniae 03/27/2017  . Obesity, Class II, BMI 35-39.9 08/10/2015  . Pure hypercholesterolemia 05/24/2015  . Seborrheic keratoses 03/27/2017   Overview:  Of the right forehead.  . Thrombocytopenia (HCC) 04/29/2017  . TIA (  transient ischemic attack) 12/19/2016  . 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 Venous insufficiency 08/10/2015    Past Surgical History:  Procedure Laterality Date  . CHOLECYSTECTOMY    . ESOPHAGOGASTRODUODENOSCOPY      Current Medications: No outpatient medications have been marked as taking for the 06/29/17 encounter (Office Visit) with Baldo Daub, MD.     Allergies:   Statins   Social History   Socioeconomic History  . Marital status: Married    Spouse name: Not on file  . Number of children: Not on  file  . Years of education: Not on file  . Highest education level: Not on file  Social Needs  . Financial resource strain: Not on file  . Food insecurity - worry: Not on file  . Food insecurity - inability: Not on file  . Transportation needs - medical: Not on file  . Transportation needs - non-medical: Not on file  Occupational History  . Not on file  Tobacco Use  . Smoking status: Former Games developer  . Smokeless tobacco: Never Used  Substance and Sexual Activity  . Alcohol use: Yes    Alcohol/week: 4.2 oz    Types: 7 Glasses of wine per week  . Drug use: No  . Sexual activity: Not on file  Other Topics Concern  . Not on file  Social History Narrative  . Not on file     Family History: The patient's family history includes Cancer in his mother; Diabetes in his brother and sister; Hypertension in his brother. ROS:   Please see the history of present illness.    All other systems reviewed and are negative.  EKGs/Labs/Other Studies Reviewed:    The following studies were reviewed todaY  Recent Labs: 06/08/2017: BUN 12; Creatinine, Ser 0.86; Potassium 4.3; Sodium 139  Recent Lipid Panel No results found for: CHOL, TRIG, HDL, CHOLHDL, VLDL, LDLCALC, LDLDIRECT  Physical Exam:    VS:  There were no vitals taken for this visit.    Wt Readings from Last 3 Encounters:  06/01/17 260 lb (117.9 kg)  05/13/17 262 lb (118.8 kg)  05/04/17 262 lb (118.8 kg)     GEN:  Well nourished, well developed in no acute distress HEENT: Normal NECK: No JVD; No carotid bruits LYMPHATICS: No lymphadenopathy CARDIAC: RRR, no murmurs, rubs, gallops RESPIRATORY:  Clear to auscultation without rales, wheezing or rhonchi  ABDOMEN: Soft, non-tender, non-distended MUSCULOSKELETAL:  No edema; No deformity  SKIN: Warm and dry NEUROLOGIC:  Alert and oriented x 3 PSYCHIATRIC:  Normal affect    Signed, Norman Herrlich, MD  06/29/2017 3:44 PM    [facility]

## 2017-06-29 NOTE — Patient Instructions (Signed)
Medication Instructions:  Your physician has recommended you make the following change in your medication:  STOP carvedilol (Coreg) START diltiazem (Cardizem) 240 mg daily  Labwork: None  Testing/Procedures: None  Follow-Up: Your physician wants you to follow-up in: 1 year. You will receive a reminder letter in the mail two months in advance. If you don't receive a letter, please call our office to schedule the follow-up appointment.  Any Other Special Instructions Will Be Listed Below (If Applicable).     If you need a refill on your cardiac medications before your next appointment, please call your pharmacy.

## 2017-06-30 ENCOUNTER — Telehealth: Payer: Self-pay | Admitting: Cardiology

## 2017-06-30 MED ORDER — TELMISARTAN 40 MG PO TABS: 40.0000 mg | ORAL_TABLET | Freq: Two times a day (BID) | ORAL | 3 refills | Status: DC

## 2017-06-30 NOTE — Telephone Encounter (Signed)
Refill sent for telmisartan 40 mg twice daily to [facility] for 90 day supply. No further questions.

## 2017-06-30 NOTE — Telephone Encounter (Signed)
Patient has several questions regarding the Telmasartin 40mg  and he is concerned about the next time he gets filled and Dr Dulce SellarMunley doubled up on the dose and now he will be out sooner and using up #90 refill so can we please call in new directions and new script to the [facility] mail order pharmacy this time!! Please call patient .

## 2017-10-22 DIAGNOSIS — I2510 Atherosclerotic heart disease of native coronary artery without angina pectoris: Secondary | ICD-10-CM

## 2017-10-22 HISTORY — DX: Atherosclerotic heart disease of native coronary artery without angina pectoris: I25.10

## 2017-10-22 HISTORY — DX: Morbid (severe) obesity due to excess calories: E66.01

## 2017-10-30 DIAGNOSIS — Z8673 Personal history of transient ischemic attack (TIA), and cerebral infarction without residual deficits: Secondary | ICD-10-CM

## 2017-10-30 HISTORY — DX: Personal history of transient ischemic attack (TIA), and cerebral infarction without residual deficits: Z86.73

## 2017-11-30 DIAGNOSIS — I214 Non-ST elevation (NSTEMI) myocardial infarction: Secondary | ICD-10-CM | POA: Insufficient documentation

## 2017-11-30 DIAGNOSIS — E789 Disorder of lipoprotein metabolism, unspecified: Secondary | ICD-10-CM | POA: Insufficient documentation

## 2017-11-30 DIAGNOSIS — Z789 Other specified health status: Secondary | ICD-10-CM

## 2017-11-30 HISTORY — DX: Other specified health status: Z78.9

## 2017-11-30 HISTORY — DX: Disorder of lipoprotein metabolism, unspecified: E78.9

## 2017-11-30 HISTORY — DX: Non-ST elevation (NSTEMI) myocardial infarction: I21.4

## 2017-12-02 ENCOUNTER — Other Ambulatory Visit: Payer: Self-pay

## 2017-12-02 DIAGNOSIS — G4733 Obstructive sleep apnea (adult) (pediatric): Secondary | ICD-10-CM

## 2017-12-02 DIAGNOSIS — Z9989 Dependence on other enabling machines and devices: Secondary | ICD-10-CM

## 2017-12-02 HISTORY — DX: Obstructive sleep apnea (adult) (pediatric): G47.33

## 2017-12-02 HISTORY — DX: Dependence on other enabling machines and devices: Z99.89

## 2017-12-03 ENCOUNTER — Encounter: Payer: Self-pay | Admitting: Cardiology

## 2017-12-03 ENCOUNTER — Ambulatory Visit: Payer: Medicare Other | Admitting: Cardiology

## 2017-12-03 VITALS — BP 148/74 | HR 48 | Ht 69.0 in | Wt 251.0 lb

## 2017-12-03 DIAGNOSIS — I351 Nonrheumatic aortic (valve) insufficiency: Secondary | ICD-10-CM

## 2017-12-03 DIAGNOSIS — E7800 Pure hypercholesterolemia, unspecified: Secondary | ICD-10-CM

## 2017-12-03 DIAGNOSIS — I447 Left bundle-branch block, unspecified: Secondary | ICD-10-CM

## 2017-12-03 DIAGNOSIS — I350 Nonrheumatic aortic (valve) stenosis: Secondary | ICD-10-CM

## 2017-12-03 DIAGNOSIS — I25118 Atherosclerotic heart disease of native coronary artery with other forms of angina pectoris: Secondary | ICD-10-CM | POA: Diagnosis not present

## 2017-12-03 DIAGNOSIS — I10 Essential (primary) hypertension: Secondary | ICD-10-CM | POA: Diagnosis not present

## 2017-12-03 DIAGNOSIS — I214 Non-ST elevation (NSTEMI) myocardial infarction: Secondary | ICD-10-CM

## 2017-12-03 MED ORDER — ASPIRIN EC 81 MG PO TBEC: 81.0000 mg | DELAYED_RELEASE_TABLET | Freq: Every day | ORAL | 3 refills | Status: DC

## 2017-12-03 MED ORDER — DILTIAZEM HCL ER COATED BEADS 240 MG PO CP24: 240.0000 mg | ORAL_CAPSULE | ORAL | 3 refills | Status: DC

## 2017-12-03 MED ORDER — CLOPIDOGREL BISULFATE 75 MG PO TABS: ORAL_TABLET | ORAL | 3 refills | Status: DC

## 2017-12-03 NOTE — Patient Instructions (Addendum)
Medication Instructions:  Your physician has recommended you make the following change in your medication:   STOP Brilinta on 01/19/2018  START Plavix 75 mg on 01/19/2018: The first two days of taking this medication, take 2 tablets (150 mg) each day. After the first two days, take 1 tablet (75 mg) daily.  DECREASE aspirin 81 mg daily  DECREASE diltiazem 240mg  daily to every other day  Labwork: Your physician recommends that you return for lab work today: CMP.   Testing/Procedures: You had an EKG today.   Follow-Up: Your physician wants you to follow-up in: 3 months. You will receive a reminder letter in the mail two months in advance. If you don't receive a letter, please call our office to schedule the follow-up appointment.   If you need a refill on your cardiac medications before your next appointment, please call your pharmacy.   Thank you for choosing [facility]! Mady GemmaCatherine Lockhart, RN [phone]     Clopidogrel tablets What is this medicine? CLOPIDOGREL (kloh PID oh grel) helps to prevent blood clots. This medicine is used to prevent heart attack, stroke, or other vascular events in people who are at high risk. This medicine may be used for other purposes; ask your health care provider or pharmacist if you have questions. COMMON BRAND NAME(S): Plavix What should I tell my health care provider before I take this medicine? They need to know if you have any of the following conditions: -bleeding disorders -bleeding in the brain -having surgery -history of stomach bleeding -an unusual or allergic reaction to clopidogrel, other medicines, foods, dyes, or preservatives -pregnant or trying to get pregnant -breast-feeding How should I use this medicine? Take this medicine by mouth with a glass of water. Follow the directions on the prescription label. You may take this medicine with or without food. If it upsets your stomach, take it with food. Take your medicine at  regular intervals. Do not take it more often than directed. Do not stop taking except on your doctor's advice. A special MedGuide will be given to you by the pharmacist with each prescription and refill. Be sure to read this information carefully each time. Talk to your pediatrician regarding the use of this medicine in children. Special care may be needed. Overdosage: If you think you have taken too much of this medicine contact a poison control center or emergency room at once. NOTE: This medicine is only for you. Do not share this medicine with others. What if I miss a dose? If you miss a dose, take it as soon as you can. If it is almost time for your next dose, take only that dose. Do not take double or extra doses. What may interact with this medicine? Do not take this medicine with the following medications: -dasabuvir; ombitasvir; paritaprevir; ritonavir -defibrotide This medicine may also interact with the following medications: -antiviral medicines for HIV or AIDS -aspirin -certain medicines for depression like citalopram, fluoxetine, fluvoxamine -certain medicines for fungal infections like ketoconazole, fluconazole, voriconazole -certain medicines for seizures like felbamate, oxcarbazepine, phenytoin -certain medicines for stomach problems like cimetidine, omeprazole, esomeprazole -certain medicines that treat or prevent blood clots like warfarin, enoxaparin, dalteparin, apixaban, dabigatran, rivaroxaban, ticlopidine -chloramphenicol -cilostazol -fluvastatin -isoniazid -modafinil -nicardipine -NSAIDS, medicines for pain and inflammation, like ibuprofen or naproxen -quinine -repaglinide -tamoxifen -tolbutamide -topiramate -torsemide This list may not describe all possible interactions. Give your health care provider a list of all the medicines, herbs, non-prescription drugs, or dietary supplements you use. Also tell them  if you smoke, drink alcohol, or use illegal drugs.  Some items may interact with your medicine. What should I watch for while using this medicine? Visit your doctor or health care professional for regular check ups. Do not stop taking your medicine unless your doctor tells you to. Notify your doctor or health care professional and seek emergency treatment if you develop breathing problems; changes in vision; chest pain; severe, sudden headache; pain, swelling, warmth in the leg; trouble speaking; sudden numbness or weakness of the face, arm or leg. These can be signs that your condition has gotten worse. If you are going to have surgery or dental work, tell your doctor or health care professional that you are taking this medicine. Certain genetic factors may reduce the effect of this medicine. Your doctor may use genetic tests to determine treatment. What side effects may I notice from receiving this medicine? Side effects that you should report to your doctor or health care professional as soon as possible: -allergic reactions like skin rash, itching or hives, swelling of the face, lips, or tongue -signs and symptoms of bleeding such as bloody or black, tarry stools; red or dark-brown urine; spitting up blood or brown material that looks like coffee grounds; red spots on the skin; unusual bruising or bleeding from the eye, gums, or nose -signs and symptoms of a blood clot such as breathing problems; changes in vision; chest pain; severe, sudden headache; pain, swelling, warmth in the leg; trouble speaking; sudden numbness or weakness of the face, arm or leg Side effects that usually do not require medical attention (report to your doctor or health care professional if they continue or are bothersome): -constipation -diarrhea -headache -upset stomach This list may not describe all possible side effects. Call your doctor for medical advice about side effects. You may report side effects to FDA at 1-800-FDA-1088. Where should I keep my medicine? Keep  out of the reach of children. Store at room temperature of 59 to 86 degrees F (15 to 30 degrees C). Throw away any unused medicine after the expiration date. NOTE: This sheet is a summary. It may not cover all possible information. If you have questions about this medicine, talk to your doctor, pharmacist, or health care provider.  2018 Elsevier/Gold Standard (2015-01-11 10:00:44)

## 2017-12-03 NOTE — Progress Notes (Signed)
Cardiology Office Note:    Date:  12/03/2017   ID:  Craig Lawson, DOB September 30, 1945, MRN [id number]  PCP:  Tarri Fuller, MD  Cardiologist:  Norman Herrlich, MD    Referring MD: Tarri Fuller, MD    ASSESSMENT:    1. Coronary artery disease of native artery of native heart with stable angina pectoris (HCC)   2. Mild aortic stenosis   3. Mild aortic regurgitation   4. Essential hypertension   5. LBBB (left bundle branch block)   6. Non-ST elevation myocardial infarction (NSTEMI), subendocardial infarction, subsequent episode of care (HCC)   7. Pure hypercholesterolemia    PLAN:    In order of problems listed above:  1. Stable after recent non-ST segment elevation MI and multivessel PCI.  At this time I would not advise elective bypass surgery will continue Brilinta for 3 months and because of financial stress we will transition to clopidogrel with loading.  He will decrease his aspirin 81 mg daily I will discuss with our CT surgeons at the next section meeting I feel confident that [facility] can meet the needs of the patient [employer] religion undergoing heart surgery.  I offered cardiac rehabilitation he declined he is already going [facility] the exercise program [facility] 2. Stable plan to recheck echocardiogram January 2020 3. See above same plan 4. Stable blood pressure target 5. Stable pattern 6. Recent PCI and stent continue dual antiplatelet therapy 7. Check CMP and lipid profile goal LDL less than 70   Next appointment: 3 months   Medication Adjustments/Labs and Tests Ordered: Current medicines are reviewed at length with the patient today.  Concerns regarding medicines are outlined above.  No orders of the defined types were placed in this encounter.  No orders of the defined types were placed in this encounter.   No chief complaint on file.   History of Present Illness:    Craig Lawson is a 72 y.o. male with a hx of hyperlipidemia,  hypertension, LBBB, sinus bradycardia  and mild As and mild AR  last seen by me 06/29/2017.  At that time because of bradycardia and malaise his beta-blocker dose was decreased and withdrawn.  In the interim he was admitted to [facility] 10/19/2017 with chest pain non-ST segment elevation MI and underwent left heart catheterization which showed severe stenosis of both marginal branch left circumflex and posterior descending artery right coronary artery with PCI and stent of each with drug-eluting stent.  Chart review peak troponin 0 0.185 approximately 4-5 times top normal renal function was normal CBC was normal  Compliance with diet, lifestyle and medications: Yes  He returns to my care seeking opinion regarding advisability of coronary bypass surgery electively and if need his hospitals that are familiar with [employer] and cardiac surgery.  Since his interventions he has had no angina dyspnea palpitation or syncope.  He recently had intervention for marginal posterior descending artery drug-eluting stent.  He is having difficulty affording Brilinta.  Past Medical History:  Diagnosis Date  . Aortic stenosis, mild 08/10/2015  . Bilateral leg edema 08/22/2015  . Cellulitis of left lower extremity 04/29/2017  . Essential hypertension 08/13/2015  . Heart murmur 04/29/2017  . Kissing, osteophytes 08/22/2015  . LBBB (left bundle branch block) 08/22/2015  . Lumbar facet joint pain 08/22/2015  . Medicare annual wellness visit, subsequent 06/02/2016  . Need for vaccination for Strep pneumoniae 03/27/2017  . Obesity, Class II, BMI 35-39.9 08/10/2015  .  Pure hypercholesterolemia 05/24/2015  . Seborrheic keratoses 03/27/2017   Overview:  Of the right forehead.  . Thrombocytopenia (HCC) 04/29/2017  . TIA (transient ischemic attack) 12/19/2016  . 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 Venous insufficiency 08/10/2015    Past Surgical History:  Procedure Laterality Date  . CHOLECYSTECTOMY     . ESOPHAGOGASTRODUODENOSCOPY      Current Medications: No outpatient medications have been marked as taking for the 12/03/17 encounter (Appointment) with Baldo DaubMunley, Jae Bruck J, MD.     Allergies:   Statins   Social History   Socioeconomic History  . Marital status: Married    Spouse name: Not on file  . Number of children: Not on file  . Years of education: Not on file  . Highest education level: Not on file  Occupational History  . Not on file  Social Needs  . Financial resource strain: Not on file  . Food insecurity:    Worry: Not on file    Inability: Not on file  . Transportation needs:    Medical: Not on file    Non-medical: Not on file  Tobacco Use  . Smoking status: Former Games developermoker  . Smokeless tobacco: Never Used  Substance and Sexual Activity  . Alcohol use: Yes    Alcohol/week: 7.0 standard drinks    Types: 7 Glasses of wine per week  . Drug use: No  . Sexual activity: Not on file  Lifestyle  . Physical activity:    Days per week: Not on file    Minutes per session: Not on file  . Stress: Not on file  Relationships  . Social connections:    Talks on phone: Not on file    Gets together: Not on file    Attends religious service: Not on file    Active member of club or organization: Not on file    Attends meetings of clubs or organizations: Not on file    Relationship status: Not on file  Other Topics Concern  . Not on file  Social History Narrative  . Not on file     Family History: The patient's family history includes Cancer in his mother; Diabetes in his brother and sister; Hypertension in his brother. ROS:   Please see the history of present illness.    All other systems reviewed and are negative.  EKGs/Labs/Other Studies Reviewed:    The following studies were reviewed today:  EKG:  EKG ordered today.  The ekg ordered today demonstrates sinus rhythm no ischemic changes Cardiogram 0 10/21/2017 left ventricular function is described as grossly  normal EF 50 to 55% mild aortic stenosis and mild mitral regurgitation. Recent Labs: 06/08/2017: BUN 12; Creatinine, Ser 0.86; Potassium 4.3; Sodium 139  Recent Lipid Panel   11/02/2017 cholesterol 182 HDL 44 LDL cholesterol 143 No results found for: CHOL, TRIG, HDL, CHOLHDL, VLDL, LDLCALC, LDLDIRECT  Physical Exam:    VS:  There were no vitals taken for this visit.    Wt Readings from Last 3 Encounters:  06/29/17 261 lb 1.9 oz (118.4 kg)  06/01/17 260 lb (117.9 kg)  05/13/17 262 lb (118.8 kg)     GEN:  Well nourished, well developed in no acute distress HEENT: Normal NECK: No JVD; No carotid bruits LYMPHATICS: No lymphadenopathy CARDIAC: RRR, no murmurs, rubs, gallops RESPIRATORY:  Clear to auscultation without rales, wheezing or rhonchi  ABDOMEN: Soft, non-tender, non-distended MUSCULOSKELETAL:  No edema; No deformity  SKIN: Warm and dry NEUROLOGIC:  Alert and oriented  x 3 PSYCHIATRIC:  Normal affect    Signed, Norman HerrlichBrian Adolph Clutter, MD  12/03/2017 8:44 AM    [facility]

## 2017-12-04 ENCOUNTER — Other Ambulatory Visit: Payer: Self-pay | Admitting: *Deleted

## 2017-12-04 DIAGNOSIS — E782 Mixed hyperlipidemia: Secondary | ICD-10-CM | POA: Insufficient documentation

## 2017-12-04 DIAGNOSIS — E7800 Pure hypercholesterolemia, unspecified: Secondary | ICD-10-CM

## 2017-12-04 HISTORY — DX: Mixed hyperlipidemia: E78.2

## 2017-12-04 LAB — COMPREHENSIVE METABOLIC PANEL
ALBUMIN: 4.8 g/dL (ref 3.5–4.8)
ALT: 33 IU/L (ref 0–44)
AST: 23 IU/L (ref 0–40)
Albumin/Globulin Ratio: 1.8 (ref 1.2–2.2)
Alkaline Phosphatase: 33 IU/L — ABNORMAL LOW (ref 39–117)
BUN / CREAT RATIO: 14 (ref 10–24)
BUN: 14 mg/dL (ref 8–27)
Bilirubin Total: 0.6 mg/dL (ref 0.0–1.2)
CALCIUM: 9.9 mg/dL (ref 8.6–10.2)
CO2: 27 mmol/L (ref 20–29)
CREATININE: 1 mg/dL (ref 0.76–1.27)
Chloride: 100 mmol/L (ref 96–106)
GFR calc Af Amer: 86 mL/min/{1.73_m2} (ref >59)
GFR, EST NON AFRICAN AMERICAN: 74 mL/min/{1.73_m2} (ref >59)
GLOBULIN, TOTAL: 2.6 g/dL (ref 1.5–4.5)
Glucose: 59 mg/dL — ABNORMAL LOW (ref 65–99)
Potassium: 4.3 mmol/L (ref 3.5–5.2)
SODIUM: 141 mmol/L (ref 134–144)
TOTAL PROTEIN: 7.4 g/dL (ref 6.0–8.5)

## 2017-12-04 NOTE — Progress Notes (Signed)
lipi

## 2017-12-05 LAB — LIPID PANEL
CHOL/HDL RATIO: 4.4 ratio (ref 0.0–5.0)
Cholesterol, Total: 200 mg/dL — ABNORMAL HIGH (ref 100–199)
HDL: 45 mg/dL (ref >39)
LDL CALC: 119 mg/dL — AB (ref 0–99)
Triglycerides: 180 mg/dL — ABNORMAL HIGH (ref 0–149)
VLDL Cholesterol Cal: 36 mg/dL (ref 5–40)

## 2017-12-05 LAB — SPECIMEN STATUS REPORT

## 2017-12-07 ENCOUNTER — Telehealth: Payer: Self-pay

## 2017-12-07 DIAGNOSIS — I25118 Atherosclerotic heart disease of native coronary artery with other forms of angina pectoris: Secondary | ICD-10-CM

## 2017-12-07 DIAGNOSIS — E7800 Pure hypercholesterolemia, unspecified: Secondary | ICD-10-CM

## 2017-12-07 MED ORDER — EZETIMIBE 10 MG PO TABS: 10.0000 mg | ORAL_TABLET | Freq: Every day | ORAL | 1 refills | Status: DC

## 2017-12-07 NOTE — Telephone Encounter (Signed)
Patient advised of lipid results with elevated LDL per Dr Dulce SellarMunley. Patient advised to start Zetia 10mg  one tablet daily, and recheck lipids in one month.   Rx sent to pharmacy.  Lab ordered entered.  Patient verbalized understanding, and agrees to plan.

## 2017-12-07 NOTE — Telephone Encounter (Signed)
-----   Message from Baldo DaubBrian J Munley, MD sent at 12/05/2017  9:42 AM EDT ----- LDL is very elevated  Add zetia to statin   Recheck 32month

## 2018-03-05 NOTE — Progress Notes (Signed)
Cardiology Office Note:    Date:  03/08/2018   ID:  Craig Lawson, DOB Sep 02, 1945, MRN [id number]  PCP:  Tarri Fuller, MD  Cardiologist:  Norman Herrlich, MD    Referring MD: Tarri Fuller, MD    ASSESSMENT:    1. Coronary artery disease of native artery of native heart with stable angina pectoris (HCC)   2. Essential hypertension   3. LBBB (left bundle branch block)   4. Mild aortic stenosis   5. Pure hypercholesterolemia   6. Bradycardia    PLAN:    In order of problems listed above:  1. CAD is stable he is having no anginal discomfort compliant with dual antiplatelet therapy and would continue the same.  He is under the impression he need an echocardiogram I looked at his records and it was just done in July at this time I do not think he requires an ischemia evaluation either perfusion imaging or repeat coronary angiogram. 2. Stable repeat blood pressure by me 130/80 3. Stable on recent EKG 4. Stable clinical 5. Continue with statin recheck lipids  6. Stable   Next appointment: 6 months   Medication Adjustments/Labs and Tests Ordered: Current medicines are reviewed at length with the patient today.  Concerns regarding medicines are outlined above.  No orders of the defined types were placed in this encounter.  No orders of the defined types were placed in this encounter.   Chief Complaint  Patient presents with  . Follow-up  . Coronary Artery Disease  . Hypertension  . Hyperlipidemia    History of Present Illness:    Craig Lawson is a 72 y.o. male with a hx of hyperlipidemia, hypertension, LBBB, sinus bradycardia  and mild As and mild AR  last seen 12/03/17.  In the interim he was admitted to [facility] 10/19/2017 with chest pain non-ST segment elevation MI and underwent left heart catheterization which showed severe stenosis of both marginal branch left circumflex and posterior descending artery right coronary artery with PCI  and stent of each with drug-eluting stent.  Chart review peak troponin 0 0.185 approximately 4-5 times top normal renal function was normal CBC was normal   Compliance with diet, lifestyle and medications: Yes    Unfortunately been having heel pain and is not exercising at the same level he is taken both aspirin and clopidogrel as dual antiplatelet therapy and is taken Zetia for lipid-lowering we will recheck his statins today he has had no angina dyspnea palpitation or syncope. Past Medical History:  Diagnosis Date  . Aortic stenosis, mild 08/10/2015  . Bilateral leg edema 08/22/2015  . Cellulitis of left lower extremity 04/29/2017  . Essential hypertension 08/13/2015  . Heart murmur 04/29/2017  . Kissing, osteophytes 08/22/2015  . LBBB (left bundle branch block) 08/22/2015  . Lumbar facet joint pain 08/22/2015  . Medicare annual wellness visit, subsequent 06/02/2016  . Need for vaccination for Strep pneumoniae 03/27/2017  . Obesity, Class II, BMI 35-39.9 08/10/2015  . Pure hypercholesterolemia 05/24/2015  . Seborrheic keratoses 03/27/2017   Overview:  Of the right forehead.  . Thrombocytopenia (HCC) 04/29/2017  . TIA (transient ischemic attack) 12/19/2016  . 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 Venous insufficiency 08/10/2015    Past Surgical History:  Procedure Laterality Date  . CARDIAC CATHETERIZATION    . CHOLECYSTECTOMY    . CORONARY ANGIOPLASTY    . ESOPHAGOGASTRODUODENOSCOPY      Current Medications: Current Meds  Medication Sig  . aspirin  EC 81 MG tablet Take 1 tablet (81 mg total) by mouth daily.  . clopidogrel (PLAVIX) 75 MG tablet Take 75 mg by mouth daily.  Marland Kitchen. diltiazem (CARDIZEM CD) 240 MG 24 hr capsule Take 1 capsule (240 mg total) by mouth every other day.  . ezetimibe (ZETIA) 10 MG tablet Take 1 tablet (10 mg total) by mouth daily.  . hydrochlorothiazide (HYDRODIURIL) 25 MG tablet Take 25 mg by mouth daily.   Marland Kitchen. lisinopril (PRINIVIL,ZESTRIL) 20 MG tablet Take 20 mg by mouth daily.        Allergies:   Statins; Isosorbide; and Latex   Social History   Socioeconomic History  . Marital status: Married    Spouse name: Not on file  . Number of children: Not on file  . Years of education: Not on file  . Highest education level: Not on file  Occupational History  . Not on file  Social Needs  . Financial resource strain: Not on file  . Food insecurity:    Worry: Not on file    Inability: Not on file  . Transportation needs:    Medical: Not on file    Non-medical: Not on file  Tobacco Use  . Smoking status: Former Games developermoker  . Smokeless tobacco: Never Used  Substance and Sexual Activity  . Alcohol use: Yes    Alcohol/week: 7.0 standard drinks    Types: 7 Glasses of wine per week  . Drug use: No  . Sexual activity: Not on file  Lifestyle  . Physical activity:    Days per week: Not on file    Minutes per session: Not on file  . Stress: Not on file  Relationships  . Social connections:    Talks on phone: Not on file    Gets together: Not on file    Attends religious service: Not on file    Active member of club or organization: Not on file    Attends meetings of clubs or organizations: Not on file    Relationship status: Not on file  Other Topics Concern  . Not on file  Social History Narrative  . Not on file     Family History: The patient's family history includes Cancer in his mother; Diabetes in his brother and sister; Hypertension in his brother. ROS:   Please see the history of present illness.    All other systems reviewed and are negative.  EKGs/Labs/Other Studies Reviewed:    The following studies were reviewed today:   Recent Labs: 12/03/2017: ALT 33; BUN 14; Creatinine, Ser 1.00; Potassium 4.3; Sodium 141  Recent Lipid Panel    Component Value Date/Time   CHOL 200 (H) 12/03/2017 1443   TRIG 180 (H) 12/03/2017 1443   HDL 45 12/03/2017 1443   CHOLHDL 4.4 12/03/2017 1443   LDLCALC 119 (H) 12/03/2017 1443    Physical Exam:    VS:   BP (!) 148/80 (BP Location: Right Arm, Patient Position: Sitting, Cuff Size: Large)   Pulse (!) 58   Ht 5\' 9"  (1.753 m)   Wt 253 lb (114.8 kg)   SpO2 95%   BMI 37.36 kg/m     Wt Readings from Last 3 Encounters:  03/08/18 253 lb (114.8 kg)  12/03/17 251 lb (113.9 kg)  06/29/17 261 lb 1.9 oz (118.4 kg)     GEN:  Well nourished, well developed in no acute distress HEENT: Normal NECK: No JVD; No carotid bruits LYMPHATICS: No lymphadenopathy CARDIAC: RRR, no murmurs, rubs, gallops  RESPIRATORY:  Clear to auscultation without rales, wheezing or rhonchi  ABDOMEN: Soft, non-tender, non-distended MUSCULOSKELETAL:  No edema; No deformity  SKIN: Warm and dry NEUROLOGIC:  Alert and oriented x 3 PSYCHIATRIC:  Normal affect    Signed, Norman Herrlich, MD  03/08/2018 8:53 AM    [facility]

## 2018-03-08 ENCOUNTER — Ambulatory Visit (INDEPENDENT_AMBULATORY_CARE_PROVIDER_SITE_OTHER): Payer: Medicare Other | Admitting: Cardiology

## 2018-03-08 ENCOUNTER — Encounter: Payer: Self-pay | Admitting: Cardiology

## 2018-03-08 VITALS — BP 148/80 | HR 58 | Ht 69.0 in | Wt 253.0 lb

## 2018-03-08 DIAGNOSIS — R001 Bradycardia, unspecified: Secondary | ICD-10-CM

## 2018-03-08 DIAGNOSIS — I447 Left bundle-branch block, unspecified: Secondary | ICD-10-CM | POA: Diagnosis not present

## 2018-03-08 DIAGNOSIS — I25118 Atherosclerotic heart disease of native coronary artery with other forms of angina pectoris: Secondary | ICD-10-CM | POA: Diagnosis not present

## 2018-03-08 DIAGNOSIS — E7800 Pure hypercholesterolemia, unspecified: Secondary | ICD-10-CM

## 2018-03-08 DIAGNOSIS — I350 Nonrheumatic aortic (valve) stenosis: Secondary | ICD-10-CM | POA: Diagnosis not present

## 2018-03-08 DIAGNOSIS — I10 Essential (primary) hypertension: Secondary | ICD-10-CM

## 2018-03-08 MED ORDER — NITROGLYCERIN 0.4 MG SL SUBL: 0.4000 mg | SUBLINGUAL_TABLET | SUBLINGUAL | 6 refills | Status: DC | PRN

## 2018-03-08 NOTE — Patient Instructions (Addendum)
Medication Instructions:  Your physician has recommended you make the following change in your medication:   START: Nitroglycerin 0.4 mg 1 tablet sublingual (under the tongue) as needed for chest pains.  When having chest pain, stop what you are doing and sit down. Take 1 nitro, wait 5 minutes. Still having chest pain, take 1 nitro, wait 5 minutes. Still having chest pain, take 1 nitro, dial 911. Total of 3 nitro in 15 minutes.   Dr Dulce Sellar would like you to get 150 minutes of activity a week.   If you need a refill on your cardiac medications before your next appointment, please call your pharmacy.   Lab work: You will have lab work today:  Lipid If you have labs (blood work) drawn today and your tests are completely normal, you will receive your results only by: Marland Kitchen MyChart Message (if you have MyChart) OR . A paper copy in the mail If you have any lab test that is abnormal or we need to change your treatment, we will call you to review the results.  Testing/Procedures: none  Follow-Up: At [facility], you and your health needs are our priority.  As part of our continuing mission to provide you with exceptional heart care, we have created designated Provider Care Teams.  These Care Teams include your primary Cardiologist (physician) and Advanced Practice Providers (APPs -  Physician Assistants and Nurse Practitioners) who all work together to provide you with the care you need, when you need it. You will need a follow up appointment in 6 months.  Please call our office 2 months in advance to schedule this appointment.    Nitroglycerin sublingual tablets What is this medicine? NITROGLYCERIN (nye troe GLI ser in) is a type of vasodilator. It relaxes blood vessels, increasing the blood and oxygen supply to your heart. This medicine is used to relieve chest pain caused by angina. It is also used to prevent chest pain before activities like climbing stairs, going outdoors in cold weather,  or sexual activity. This medicine may be used for other purposes; ask your health care provider or pharmacist if you have questions. COMMON BRAND NAME(S): Nitroquick, Nitrostat, Nitrotab What should I tell my health care provider before I take this medicine? They need to know if you have any of these conditions: -anemia -head injury, recent stroke, or bleeding in the brain -liver disease -previous heart attack -an unusual or allergic reaction to nitroglycerin, other medicines, foods, dyes, or preservatives -pregnant or trying to get pregnant -breast-feeding How should I use this medicine? Take this medicine by mouth as needed. At the first sign of an angina attack (chest pain or tightness) place one tablet under your tongue. You can also take this medicine 5 to 10 minutes before an event likely to produce chest pain. Follow the directions on the prescription label. Let the tablet dissolve under the tongue. Do not swallow whole. Replace the dose if you accidentally swallow it. It will help if your mouth is not dry. Saliva around the tablet will help it to dissolve more quickly. Do not eat or drink, smoke or chew tobacco while a tablet is dissolving. If you are not better within 5 minutes after taking ONE dose of nitroglycerin, call 9-1-1 immediately to seek emergency medical care. Do not take more than 3 nitroglycerin tablets over 15 minutes. If you take this medicine often to relieve symptoms of angina, your doctor or health care professional may provide you with different instructions to manage your symptoms.  If symptoms do not go away after following these instructions, it is important to call 9-1-1 immediately. Do not take more than 3 nitroglycerin tablets over 15 minutes. Talk to your pediatrician regarding the use of this medicine in children. Special care may be needed. Overdosage: If you think you have taken too much of this medicine contact a poison control center or emergency room at  once. NOTE: This medicine is only for you. Do not share this medicine with others. What if I miss a dose? This does not apply. This medicine is only used as needed. What may interact with this medicine? Do not take this medicine with any of the following medications: -certain migraine medicines like ergotamine and dihydroergotamine (DHE) -medicines used to treat erectile dysfunction like sildenafil, tadalafil, and vardenafil -riociguat This medicine may also interact with the following medications: -alteplase -aspirin -heparin -medicines for high blood pressure -medicines for mental depression -other medicines used to treat angina -phenothiazines like chlorpromazine, mesoridazine, prochlorperazine, thioridazine This list may not describe all possible interactions. Give your health care provider a list of all the medicines, herbs, non-prescription drugs, or dietary supplements you use. Also tell them if you smoke, drink alcohol, or use illegal drugs. Some items may interact with your medicine. What should I watch for while using this medicine? Tell your doctor or health care professional if you feel your medicine is no longer working. Keep this medicine with you at all times. Sit or lie down when you take your medicine to prevent falling if you feel dizzy or faint after using it. Try to remain calm. This will help you to feel better faster. If you feel dizzy, take several deep breaths and lie down with your feet propped up, or bend forward with your head resting between your knees. You may get drowsy or dizzy. Do not drive, use machinery, or do anything that needs mental alertness until you know how this drug affects you. Do not stand or sit up quickly, especially if you are an older patient. This reduces the risk of dizzy or fainting spells. Alcohol can make you more drowsy and dizzy. Avoid alcoholic drinks. Do not treat yourself for coughs, colds, or pain while you are taking this medicine  without asking your doctor or health care professional for advice. Some ingredients may increase your blood pressure. What side effects may I notice from receiving this medicine? Side effects that you should report to your doctor or health care professional as soon as possible: -blurred vision -dry mouth -skin rash -sweating -the feeling of extreme pressure in the head -unusually weak or tired Side effects that usually do not require medical attention (report to your doctor or health care professional if they continue or are bothersome): -flushing of the face or neck -headache -irregular heartbeat, palpitations -nausea, vomiting This list may not describe all possible side effects. Call your doctor for medical advice about side effects. You may report side effects to FDA at 1-800-FDA-1088. Where should I keep my medicine? Keep out of the reach of children. Store at room temperature between 20 and 25 degrees C (68 and 77 degrees F). Store in Retail buyeroriginal container. Protect from light and moisture. Keep tightly closed. Throw away any unused medicine after the expiration date. NOTE: This sheet is a summary. It may not cover all possible information. If you have questions about this medicine, talk to your doctor, pharmacist, or health care provider.  2018 Elsevier/Gold Standard (2013-02-03 17:57:36)

## 2018-03-09 LAB — LIPID PANEL
CHOLESTEROL TOTAL: 192 mg/dL (ref 100–199)
Chol/HDL Ratio: 5.1 ratio — ABNORMAL HIGH (ref 0.0–5.0)
HDL: 38 mg/dL — ABNORMAL LOW (ref >39)
LDL Calculated: 116 mg/dL — ABNORMAL HIGH (ref 0–99)
Triglycerides: 188 mg/dL — ABNORMAL HIGH (ref 0–149)
VLDL CHOLESTEROL CAL: 38 mg/dL (ref 5–40)

## 2018-03-11 ENCOUNTER — Telehealth: Payer: Self-pay

## 2018-03-11 NOTE — Telephone Encounter (Signed)
-----   Message from Baldo DaubBrian J Munley, MD sent at 03/09/2018  8:17 AM EST ----- Not at [employer], will he try a less potent statin with zetia, pravastatin 20 mg day or if not refer to lipid clinic for PCSK9 repatha 140 mg every 2 weeks

## 2018-03-11 NOTE — Telephone Encounter (Signed)
Patients wife Randa EvensJoAnne notified of lab results and recommendations from Dr Dulce SellarMunley. She will speak with patient and he will call back with his plan.  Will await patients return call.

## 2018-03-12 MED ORDER — PRAVASTATIN SODIUM 20 MG PO TABS: 20.0000 mg | ORAL_TABLET | Freq: Every day | ORAL | 3 refills | Status: DC

## 2018-03-12 NOTE — Telephone Encounter (Signed)
Spoke with patient regarding medications. Patient wishes to try pravastatin 20 mg daily in addition to zetia 10 mg daily. Patient is very hesitant to try another statin as he has developed muscle aches in the past but is agreeable. Advised patient to contact our office with any concerns. Patient does not have any interest in repatha. Prescription has been sent. No further questions.

## 2018-09-09 ENCOUNTER — Ambulatory Visit: Payer: Medicare Other | Admitting: Cardiology

## 2018-09-17 ENCOUNTER — Telehealth: Payer: Self-pay | Admitting: Cardiology

## 2018-09-17 NOTE — Telephone Encounter (Signed)
Virtual Visit Pre-Appointment Phone Call  "(Name), I am calling you today to discuss your upcoming appointment. We are currently trying to limit exposure to the virus that causes COVID-19 by seeing patients at home rather than in the office."  1. "What is the BEST phone number to call the day of the visit?" - include this in appointment notes  2. Do you have or have access to (through a family member/friend) a smartphone with video capability that we can use for your visit?" a. If yes - list this number in appt notes as cell (if different from BEST phone #) and list the appointment type as a VIDEO visit in appointment notes b. If no - list the appointment type as a PHONE visit in appointment notes  3. Confirm consent - "In the setting of the current Covid19 crisis, you are scheduled for a (phone or video) visit with your provider on (date) at (time).  Just as we do with many in-office visits, in order for you to participate in this visit, we must obtain consent.  If you'd like, I can send this to your mychart (if signed up) or email for you to review.  Otherwise, I can obtain your verbal consent now.  All virtual visits are billed to your insurance company just like a normal visit would be.  By agreeing to a virtual visit, we'd like you to understand that the technology does not allow for your provider to perform an examination, and thus may limit your provider's ability to fully assess your condition. If your provider identifies any concerns that need to be evaluated in person, we will make arrangements to do so.  Finally, though the technology is pretty good, we cannot assure that it will always work on either your or our end, and in the setting of a video visit, we may have to convert it to a phone-only visit.  In either situation, we cannot ensure that we have a secure connection.  Are you willing to proceed?" STAFF: Did the patient verbally acknowledge consent to telehealth visit? Document  YES/NO here: YES  4. Advise patient to be prepared - "Two hours prior to your appointment, go ahead and check your blood pressure, pulse, oxygen saturation, and your weight (if you have the equipment to check those) and write them all down. When your visit starts, your provider will ask you for this information. If you have an Apple Watch or Kardia device, please plan to have heart rate information ready on the day of your appointment. Please have a pen and paper handy nearby the day of the visit as well."  5. Give patient instructions for MyChart download to smartphone OR Doximity/Doxy.me as below if video visit (depending on what platform provider is using)  6. Inform patient they will receive a phone call 15 minutes prior to their appointment time (may be from unknown caller ID) so they should be prepared to answer    TELEPHONE CALL NOTE  Craig Lawson has been deemed a candidate for a follow-up tele-health visit to limit community exposure during the Covid-19 pandemic. I spoke with the patient via phone to ensure availability of phone/video source, confirm preferred email & phone number, and discuss instructions and expectations.  I reminded Craig RosserOscar Harbach to be prepared with any vital sign and/or heart rhythm information that could potentially be obtained via home monitoring, at the time of his visit. I reminded Craig RosserOscar Tryon to expect a phone call prior to his visit.  Samella Parr 09/17/2018 1:46 PM   INSTRUCTIONS FOR DOWNLOADING THE MYCHART APP TO SMARTPHONE  - The patient must first make sure to have activated MyChart and know their login information - If Apple, go to [employer] and type in MyChart in the search bar and download the app. If Android, ask patient to go to [employer] and type in [place] in the search bar and download the app. The app is free but as with any other app downloads, their phone may require them to verify saved payment information or Apple/Android password.  -  The patient will need to then log into the app with their MyChart username and password, and select [facility] as their healthcare provider to link the account. When it is time for your visit, go to the MyChart app, find appointments, and click Begin Video Visit. Be sure to Select Allow for your device to access the Microphone and Camera for your visit. You will then be connected, and your provider will be with you shortly.  **If they have any issues connecting, or need assistance please contact MyChart service desk [phone])**  **If using a computer, in order to ensure the best quality for their visit they will need to use either of the following Internet Browsers: [employer] or Google Chrome**  IF USING DOXIMITY or DOXY.ME - The patient will receive a link just prior to their visit by text.     FULL LENGTH CONSENT FOR TELE-HEALTH VISIT   I hereby voluntarily request, consent and authorize [facility] and its employed or contracted physicians, physician assistants, nurse practitioners or other licensed health care professionals (the Practitioner), to provide me with telemedicine health care services (the Services") as deemed necessary by the treating Practitioner. I acknowledge and consent to receive the Services by the Practitioner via telemedicine. I understand that the telemedicine visit will involve communicating with the Practitioner through live audiovisual communication technology and the disclosure of certain medical information by electronic transmission. I acknowledge that I have been given the opportunity to request an in-person assessment or other available alternative prior to the telemedicine visit and am voluntarily participating in the telemedicine visit.  I understand that I have the right to withhold or withdraw my consent to the use of telemedicine in the course of my care at any time, without affecting my right to future care or treatment, and that the  Practitioner or I may terminate the telemedicine visit at any time. I understand that I have the right to inspect all information obtained and/or recorded in the course of the telemedicine visit and may receive copies of available information for a reasonable fee.  I understand that some of the potential risks of receiving the Services via telemedicine include:   Delay or interruption in medical evaluation due to technological equipment failure or disruption;  Information transmitted may not be sufficient (e.g. poor resolution of images) to allow for appropriate medical decision making by the Practitioner; and/or   In rare instances, security protocols could fail, causing a breach of personal health information.  Furthermore, I acknowledge that it is my responsibility to provide information about my medical history, conditions and care that is complete and accurate to the best of my ability. I acknowledge that Practitioner's advice, recommendations, and/or decision may be based on factors not within their control, such as incomplete or inaccurate data provided by me or distortions of diagnostic images or specimens that may result from electronic transmissions. I understand that the practice  of medicine is not an Chief Strategy Officer and that Practitioner makes no warranties or guarantees regarding treatment outcomes. I acknowledge that I will receive a copy of this consent concurrently upon execution via email to the email address I last provided but may also request a printed copy by calling the office of [facility].    I understand that my insurance will be billed for this visit.   I have read or had this consent read to me.  I understand the contents of this consent, which adequately explains the benefits and risks of the Services being provided via telemedicine.   I have been provided ample opportunity to ask questions regarding this consent and the Services and have had my questions answered to my  satisfaction.  I give my informed consent for the services to be provided through the use of telemedicine in my medical care  By participating in this telemedicine visit I agree to the above.

## 2018-09-19 NOTE — Progress Notes (Signed)
Virtual Visit via Telephone Note   This visit type was conducted due to national recommendations for restrictions regarding the COVID-19 Pandemic (e.g. social distancing) in an effort to limit this patient's exposure and mitigate transmission in our community.  Due to his co-morbid illnesses, this patient is at least at moderate risk for complications without adequate follow up.  This format is felt to be most appropriate for this patient at this time.  The patient did not have access to video technology/had technical difficulties with video requiring transitioning to audio format only (telephone).  All issues noted in this document were discussed and addressed.  No physical exam could be performed with this format.  Please refer to the patient's chart for his  consent to telehealth for [facility].   Date:  09/20/2018   ID:  Craig Lawson, DOB [birth year], MRN [id number]  Patient Location: Home Provider Location: Office  PCP:  Rubie Maid, MD  Cardiologist:  Shirlee More, MD  Electrophysiologist:  None   Evaluation Performed:  Follow-Up Visit  Chief Complaint:  FU for CAD  History of Present Illness:    Rockford Leinen is a 73 y.o. male with a history of CAD nonSTEMI 10/20/27 with PCI and stent of OM and RCA last seen by me 03/08/18.  Labs 08/31/18 via Care Everywhere show:  Total cholesterol 179 Triglycerides 166 HDL 40 LDL 132  K 4.2 Creatinine 0.85 GFR 86  The patient does not have symptoms concerning for COVID-19 infection (fever, chills, cough, or new shortness of breath).   He is poorly statin intolerant presently is taking pravastatin 1 day a week he tolerates Zetia residual LDL is above 100 and I asked him to increase his statin to 2 times a week and is hesitant to accept PCSK9 due to finances.  He has had no angina syncope shortness of breath but he has intermittent palpitations several days a week.  For further evaluation will utilize a 1 week ZIO monitor.  He is  approaching the one-year anniversary of his PCI multivessel I have asked to remain on long-term dual antiplatelet therapy.  He is having no angina on current medical treatment   Past Medical History:  Diagnosis Date  . Aortic stenosis, mild 08/10/2015  . Bilateral leg edema 08/22/2015  . Cellulitis of left lower extremity 04/29/2017  . Essential hypertension 08/13/2015  . Heart murmur 04/29/2017  . Kissing, osteophytes 08/22/2015  . LBBB (left bundle branch block) 08/22/2015  . Lumbar facet joint pain 08/22/2015  . Medicare annual wellness visit, subsequent 06/02/2016  . Need for vaccination for Strep pneumoniae 03/27/2017  . Obesity, Class II, BMI 35-39.9 08/10/2015  . Pure hypercholesterolemia 05/24/2015  . Seborrheic keratoses 03/27/2017   Overview:  Of the right forehead.  . Thrombocytopenia ([place]) 04/29/2017  . TIA (transient ischemic attack) 12/19/2016  . 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 Venous insufficiency 08/10/2015   Past Surgical History:  Procedure Laterality Date  . CARDIAC CATHETERIZATION    . CHOLECYSTECTOMY    . CORONARY ANGIOPLASTY    . ESOPHAGOGASTRODUODENOSCOPY       Current Meds  Medication Sig  . aspirin EC 81 MG tablet Take 1 tablet (81 mg total) by mouth daily.  . clopidogrel (PLAVIX) 75 MG tablet Take 75 mg by mouth daily.  Marland Kitchen diltiazem (CARDIZEM CD) 240 MG 24 hr capsule Take 1 capsule (240 mg total) by mouth every other day.  . ezetimibe (ZETIA) 10 MG tablet Take 1 tablet (10 mg total) by mouth  daily.  . hydrochlorothiazide (HYDRODIURIL) 25 MG tablet Take 25 mg by mouth daily.   Marland Kitchen lisinopril (PRINIVIL,ZESTRIL) 20 MG tablet Take 20 mg by mouth daily.   . nitroGLYCERIN (NITROSTAT) 0.4 MG SL tablet Place 1 tablet (0.4 mg total) under the tongue every 5 (five) minutes as needed for chest pain.  . pravastatin (PRAVACHOL) 20 MG tablet Take 1 tablet (20 mg total) by mouth daily. (Patient taking differently: Take 20 mg by mouth. Takes one time a week)     Allergies:   Statins;  Isosorbide; and Latex   Social History   Tobacco Use  . Smoking status: Former Research scientist (life sciences)  . Smokeless tobacco: Never Used  Substance Use Topics  . Alcohol use: Yes    Alcohol/week: 7.0 standard drinks    Types: 7 Glasses of wine per week  . Drug use: No     Family Hx: The patient's family history includes Cancer in his mother; Diabetes in his brother and sister; Hypertension in his brother.  ROS:   Please see the history of present illness.     All other systems reviewed and are negative.   Prior CV studies:   The following studies were reviewed today:  TTE 10/21/17:  Summary The left ventricle was not well visualized. Grossly normal left ventricular function. Ejection fraction estimated at 50-55%. Mild to moderate concentric left ventricular hypertrophy Mild aortic stenosis. Mild mitral regurgitation. Aortic gradient higher than last report P/M 21/14 mm Hg.  Left heart cath 10/20/17: Coronary angio. Diagnostic and therapeutic Summary: Severe stenosis of the Marginal Branch, PDA Interventional Summary Successful PCI / 2.25 X 23 Xience Drug Eluting Stent of the mid Posterior Descending Coronary Artery. Successful PCI / 3.25 X 18 Xience Drug Eluting Stent of the proximal 2nd Obtuse Marginal Coronary Artery. Interventional Recommendations Medical therapy for CAD Risk factor reduction, Angina Dual anti-platelet therapy with Ticagrelor and Aspirin 81 mg for at least 6 months is recommended. Angiographic Findings Cardiac Arteries and Lesion Findings LMCA: Lesion on LMCA: 5% stenosis 6 mm length . LAD: Lesion on Prox LAD: 70% stenosis 23 mm length . Lesion on 1st Diag: 60% stenosis 10 mm length . LCx: Lesion on 2nd Ob Marg: Proximal subsection.95% stenosis 16 mm length reduced to 0%. Pre procedure TIMI III flow was noted. Post Procedure TIMI III flow was present. Good run off was present. The lesion was diagnosed as Moderate Risk (B). The lesion was heavily  calcified.The lesion showed mild angulation. Devices used - Abbott 0.014" x 190cm BMW J-Tip PTCI Guidewire. Number of passes: 1. - 2.25 mm X 15 mm Trek RX. 1 inflation(s) to a max pressure of: 10 atm. - 3.0 mm X 15 mm Trek RX. 2 inflation(s) to a max pressure of: 12 atm. - 2.75x60m [place] Euphora. 1 inflation(s) to a max pressure of: 16 atm. - 3.25 mm x 18 mm Xience Sierra DES Stent. (Primary Device)Length: 18 mm. 2 inflation(s) to a max pressure of: 14 atm. Lesion on Prox CX: 30% stenosis 11 mm length . RCA: Lesion on R PDA: Proximal subsection.99% stenosis 21 mm length reduced to 0%. Pre procedure TIMI I flow was noted. Post Procedure TIMI III flow was present. Good run off was present. The lesion was diagnosed as High Risk (C). Devices used - 2UconDes Stent. (Primary Device)Length: 23 mm. Ramus: Lesion on Ramus: 90% stenosis 7 mm length .Poor run off was present. Comments:The vessel is small in caliber.   Labs/Other Tests and Data Reviewed:  EKG:  No ECG reviewed.  Recent Labs: 12/03/2017: ALT 33; BUN 14; Creatinine, Ser 1.00; Potassium 4.3; Sodium 141   Recent Lipid Panel Lab Results  Component Value Date/Time   CHOL 192 03/08/2018 09:37 AM   TRIG 188 (H) 03/08/2018 09:37 AM   HDL 38 (L) 03/08/2018 09:37 AM   CHOLHDL 5.1 (H) 03/08/2018 09:37 AM   LDLCALC 116 (H) 03/08/2018 09:37 AM    Wt Readings from Last 3 Encounters:  09/20/18 253 lb (114.8 kg)  03/08/18 253 lb (114.8 kg)  12/03/17 251 lb (113.9 kg)     Objective:    Vital Signs:  Ht 5' 9" (1.753 m)   Wt 253 lb (114.8 kg)   BMI 37.36 kg/m    VITAL SIGNS:  reviewed GEN:  no acute distress  ASSESSMENT & PLAN:    1. Coronary artery disease stable on current medical treatment after multivessel PCI July 2019 continue current therapy including his dual antiplatelet and lipid-lowering with a minimal dose of statin and Zetia.  At this time I do not think he requires an ischemia evaluation on [place] Heart Association class I 2. Mild aortic stenosis and regurgitation consider follow-up echocardiogram in 1 year 3. Hypertension stable blood pressure last week with his PCP 120/79 continue diuretic ACE calcium channel blocker 4. Left bundle branch block check EKG next visit 5. Hyperlipidemia poorly controlled he will increase his statin and if he remains greater than 100 revisit the issue of PCSK9 inhibition  COVID-19 Education: The signs and symptoms of COVID-19 were discussed with the patient and how to seek care for testing (follow up with PCP or arrange E-visit).  The importance of social distancing was discussed today.  Time:   Today, I have spent 20 minutes with the patient with telehealth technology discussing the above problems.     Medication Adjustments/Labs and Tests Ordered: Current medicines are reviewed at length with the patient today.  Concerns regarding medicines are outlined above.   Tests Ordered: No orders of the defined types were placed in this encounter.   Medication Changes: No orders of the defined types were placed in this encounter.   Disposition:  Follow up in 6 week(s)  Signed, Shirlee More, MD  09/20/2018 1:42 PM    [facility]

## 2018-09-20 ENCOUNTER — Encounter: Payer: Self-pay | Admitting: Cardiology

## 2018-09-20 ENCOUNTER — Other Ambulatory Visit: Payer: Self-pay

## 2018-09-20 ENCOUNTER — Telehealth (INDEPENDENT_AMBULATORY_CARE_PROVIDER_SITE_OTHER): Payer: Medicare Other | Admitting: Cardiology

## 2018-09-20 VITALS — Ht 69.0 in | Wt 253.0 lb

## 2018-09-20 DIAGNOSIS — I350 Nonrheumatic aortic (valve) stenosis: Secondary | ICD-10-CM

## 2018-09-20 DIAGNOSIS — I25118 Atherosclerotic heart disease of native coronary artery with other forms of angina pectoris: Secondary | ICD-10-CM

## 2018-09-20 DIAGNOSIS — I351 Nonrheumatic aortic (valve) insufficiency: Secondary | ICD-10-CM

## 2018-09-20 DIAGNOSIS — E7800 Pure hypercholesterolemia, unspecified: Secondary | ICD-10-CM

## 2018-09-20 DIAGNOSIS — Z7189 Other specified counseling: Secondary | ICD-10-CM

## 2018-09-20 DIAGNOSIS — I447 Left bundle-branch block, unspecified: Secondary | ICD-10-CM

## 2018-09-20 DIAGNOSIS — R002 Palpitations: Secondary | ICD-10-CM

## 2018-09-20 DIAGNOSIS — I10 Essential (primary) hypertension: Secondary | ICD-10-CM

## 2018-09-20 MED ORDER — PRAVASTATIN SODIUM 20 MG PO TABS: ORAL_TABLET | ORAL | 2 refills | Status: DC

## 2018-09-20 NOTE — Patient Instructions (Addendum)
Medication Instructions:  CHANGE: Pravastatin to 20 mg two times per week  If you need a refill on your cardiac medications before your next appointment, please call your pharmacy.   Lab work: None If you have labs (blood work) drawn today and your tests are completely normal, you will receive your results only by: Marland Kitchen MyChart Message (if you have MyChart) OR . A paper copy in the mail If you have any lab test that is abnormal or we need to change your treatment, we will call you to review the results.  Testing/Procedures: Your physician has recommended that you wear a ZIO monitor. ZIO monitors are medical devices that record the heart's electrical activity. Doctors most often use these monitors to diagnose arrhythmias. Arrhythmias are problems with the speed or rhythm of the heartbeat. The monitor is a small, portable device. You can wear one while you do your normal daily activities. This is usually used to diagnose what is causing palpitations/syncope (passing out).  WEAR 7 days  Follow-Up: At [facility], you and your health needs are our priority.  As part of our continuing mission to provide you with exceptional heart care, we have created designated Provider Care Teams.  These Care Teams include your primary Cardiologist (physician) and Advanced Practice Providers (APPs -  Physician Assistants and Nurse Practitioners) who all work together to provide you with the care you need, when you need it. You will need a follow up appointment in 6 weeks.  Any Other Special Instructions Will Be Listed Below (If Applicable).

## 2018-10-06 ENCOUNTER — Telehealth: Payer: Self-pay | Admitting: Cardiology

## 2018-10-06 NOTE — Telephone Encounter (Signed)
Patient states on 09/20/2018  Dr Bettina Gavia said he was going to have a monitor sent to him to wear for 7 days but he has nt received it.  Can you check the status of this monitor and call the patient back [phone]

## 2018-10-06 NOTE — Telephone Encounter (Signed)
Spoke with pt about order for monitor. Pt was never enrolled for monitor which was ordered on 6/1 so I verified address and enrolled pt today.

## 2018-10-12 ENCOUNTER — Other Ambulatory Visit (INDEPENDENT_AMBULATORY_CARE_PROVIDER_SITE_OTHER): Payer: Medicare Other

## 2018-10-12 ENCOUNTER — Other Ambulatory Visit: Payer: Self-pay | Admitting: Cardiology

## 2018-10-12 DIAGNOSIS — R002 Palpitations: Secondary | ICD-10-CM

## 2018-10-13 NOTE — Telephone Encounter (Signed)
Plavix refill sent

## 2018-10-14 ENCOUNTER — Telehealth: Payer: Self-pay | Admitting: Cardiology

## 2018-10-15 ENCOUNTER — Telehealth: Payer: Self-pay | Admitting: Cardiology

## 2018-10-15 NOTE — Telephone Encounter (Signed)
Heart monitor company called to say patient fell and his monitor came off. He could not get it to stay on so he mailed back to company. He got three days of the monitor done though.

## 2018-11-08 NOTE — Progress Notes (Signed)
Cardiology Office Note:    Date:  11/09/2018   ID:  Craig Lawson, DOB 08-09-1945, MRN [id number]  PCP:  Craig Maid, MD  Cardiologist:  Craig More, MD    Referring MD: Craig Maid, MD    ASSESSMENT:    1. Coronary artery disease of native artery of native heart with stable angina pectoris ([place])   2. Essential hypertension   3. Pure hypercholesterolemia   4. LBBB (left bundle branch block)   5. Mild aortic stenosis   6. Mild aortic regurgitation   7. Sinus bradycardia    PLAN:    In order of problems listed above:  1. Stable CAD continue medical treatment he had multivessel PCI a year ago continue dual antiplatelet and check myocardial perfusion study for residual ischemia.  If he had high risk markers and favor repeat left heart catheterization and appropriate revascularization 2. Stable hypertension with sinus bradycardia discontinue rate limiting calcium channel blocker start ARB 3. Recheck lipid profile he takes a minimum dose low intensity statin and Zetia and he declines PCSK9 4. Stable pattern today's EKG 5. He has known aortic valve disease recheck echocardiogram 6. Discontinue Cardizem   Next appointment: 6 months   Medication Adjustments/Labs and Tests Ordered: Current medicines are reviewed at length with the patient today.  Concerns regarding medicines are outlined above.  No orders of the defined types were placed in this encounter.  No orders of the defined types were placed in this encounter.   No chief complaint on file.   History of Present Illness:    Craig Lawson is a 73 y.o. male with a hx of CAD nonSTEMI 10/20/27 with PCI and stent of OM and RCA mild AS/AR, hypertesnion, hyperlipidemia and LBBB  last seen 09/20/2018.  Unfortunately with COVID-19 is less active and still walks daily with his wife.  He has had no exertional angina dyspnea syncope or TIA he is bothered with home heart rates less than 50 bpm and palpitation at times today  sinus bradycardia 49.  His rate limiting calcium channel blocker and switch to an ARB for antihypertensive therapy recheck renal function and lipid profile.  Again discussed PCSK9 therapy and he declines predominantly due to cost.  With his underlying multivessel CAD and aortic valve disease he will undergo myocardial perfusion study echocardiogram.  He will continue dual antiplatelet therapy has had no bleeding complication Compliance with diet, lifestyle and medications: Yes Past Medical History:  Diagnosis Date  . Aortic stenosis, mild 08/10/2015  . Bilateral leg edema 08/22/2015  . Cellulitis of left lower extremity 04/29/2017  . Essential hypertension 08/13/2015  . Heart murmur 04/29/2017  . Kissing, osteophytes 08/22/2015  . LBBB (left bundle branch block) 08/22/2015  . Lumbar facet joint pain 08/22/2015  . Medicare annual wellness visit, subsequent 06/02/2016  . Need for vaccination for Strep pneumoniae 03/27/2017  . Obesity, Class II, BMI 35-39.9 08/10/2015  . Pure hypercholesterolemia 05/24/2015  . Seborrheic keratoses 03/27/2017   Overview:  Of the right forehead.  . Thrombocytopenia ([place]) 04/29/2017  . TIA (transient ischemic attack) 12/19/2016  . 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 Venous insufficiency 08/10/2015    Past Surgical History:  Procedure Laterality Date  . CARDIAC CATHETERIZATION    . CHOLECYSTECTOMY    . CORONARY ANGIOPLASTY    . ESOPHAGOGASTRODUODENOSCOPY      Current Medications: Current Meds  Medication Sig  . aspirin EC 81 MG tablet Take 1 tablet (81 mg total) by mouth daily.  . clopidogrel (PLAVIX)  75 MG tablet Take 75 mg by mouth daily.  Marland Kitchen. diltiazem (CARDIZEM CD) 240 MG 24 hr capsule Take 1 capsule (240 mg total) by mouth every other day.  . ezetimibe (ZETIA) 10 MG tablet Take 1 tablet (10 mg total) by mouth daily.  . hydrochlorothiazide (HYDRODIURIL) 25 MG tablet Take 25 mg by mouth daily.   Marland Kitchen. lisinopril (PRINIVIL,ZESTRIL) 20 MG tablet Take 20 mg by mouth daily.   .  nitroGLYCERIN (NITROSTAT) 0.4 MG SL tablet Place 1 tablet (0.4 mg total) under the tongue every 5 (five) minutes as needed for chest pain.  . pravastatin (PRAVACHOL) 20 MG tablet Take 2 times per week     Allergies:   Statins, Isosorbide, and Latex   Social History   Socioeconomic History  . Marital status: Married    Spouse name: Not on file  . Number of children: Not on file  . Years of education: Not on file  . Highest education level: Not on file  Occupational History  . Not on file  Social Needs  . Financial resource strain: Not on file  . Food insecurity    Worry: Not on file    Inability: Not on file  . Transportation needs    Medical: Not on file    Non-medical: Not on file  Tobacco Use  . Smoking status: Former Smoker    Types: Cigarettes    Quit date: 1973    Years since quitting: 47.5  . Smokeless tobacco: Never Used  Substance and Sexual Activity  . Alcohol use: Yes    Alcohol/week: 7.0 standard drinks    Types: 7 Glasses of wine per week    Comment: per week  . Drug use: No  . Sexual activity: Not on file  Lifestyle  . Physical activity    Days per week: Not on file    Minutes per session: Not on file  . Stress: Not on file  Relationships  . Social Musicianconnections    Talks on phone: Not on file    Gets together: Not on file    Attends religious service: Not on file    Active member of club or organization: Not on file    Attends meetings of clubs or organizations: Not on file    Relationship status: Not on file  Other Topics Concern  . Not on file  Social History Narrative  . Not on file     Family History: The patient's family history includes Cancer in his mother; Diabetes in his brother and sister; Hypertension in his brother. ROS:   Please see the history of present illness.    All other systems reviewed and are negative.  EKGs/Labs/Other Studies Reviewed:    The following studies were reviewed today:  EKG:  EKG ordered today and  personally reviewed.  The ekg ordered today demonstrates sinus bradycardia 49 BPM left bundle branch block  Labs 08/31/18 via Care Everywhere show:  Total cholesterol 179 Triglycerides 166 HDL 40 LDL 132 K 4.2 Creatinine 0.85 GFR 86  Recent Lipid Panel    Component Value Date/Time   CHOL 192 03/08/2018 0937   TRIG 188 (H) 03/08/2018 0937   HDL 38 (L) 03/08/2018 0937   CHOLHDL 5.1 (H) 03/08/2018 0937   LDLCALC 116 (H) 03/08/2018 16100937    Physical Exam:    VS:  BP 134/74 (BP Location: Right Arm, Patient Position: Sitting, Cuff Size: Large)   Pulse (!) 49   Temp 98.2 F (36.8 C)   Ht  5\' 9"  (1.753 m)   Wt 255 lb 12.8 oz (116 kg)   SpO2 96%   BMI 37.78 kg/m     Wt Readings from Last 3 Encounters:  11/09/18 255 lb 12.8 oz (116 kg)  09/20/18 253 lb (114.8 kg)  03/08/18 253 lb (114.8 kg)     GEN:  Well nourished, well developed in no acute distress HEENT: Normal NECK: No JVD; No carotid bruits LYMPHATICS: No lymphadenopathy CARDIAC: RRR, grade 2/6 systolic ejection murmur aortic area mid peaking S2 normal murmurs, rubs, gallops RESPIRATORY:  Clear to auscultation without rales, wheezing or rhonchi  ABDOMEN: Soft, non-tender, non-distended MUSCULOSKELETAL:  No edema; No deformity  SKIN: Warm and dry NEUROLOGIC:  Alert and oriented x 3 PSYCHIATRIC:  Normal affect    Signed, Norman HerrlichBrian Asuna Peth, MD  11/09/2018 10:24 AM    [facility]

## 2018-11-09 ENCOUNTER — Ambulatory Visit (INDEPENDENT_AMBULATORY_CARE_PROVIDER_SITE_OTHER): Payer: Medicare Other | Admitting: Cardiology

## 2018-11-09 ENCOUNTER — Encounter: Payer: Self-pay | Admitting: *Deleted

## 2018-11-09 ENCOUNTER — Other Ambulatory Visit: Payer: Self-pay

## 2018-11-09 ENCOUNTER — Encounter: Payer: Self-pay | Admitting: Cardiology

## 2018-11-09 VITALS — BP 134/74 | HR 49 | Temp 98.2°F | Ht 69.0 in | Wt 255.8 lb

## 2018-11-09 DIAGNOSIS — E7800 Pure hypercholesterolemia, unspecified: Secondary | ICD-10-CM | POA: Diagnosis not present

## 2018-11-09 DIAGNOSIS — I351 Nonrheumatic aortic (valve) insufficiency: Secondary | ICD-10-CM

## 2018-11-09 DIAGNOSIS — I25118 Atherosclerotic heart disease of native coronary artery with other forms of angina pectoris: Secondary | ICD-10-CM | POA: Diagnosis not present

## 2018-11-09 DIAGNOSIS — I447 Left bundle-branch block, unspecified: Secondary | ICD-10-CM

## 2018-11-09 DIAGNOSIS — I10 Essential (primary) hypertension: Secondary | ICD-10-CM

## 2018-11-09 DIAGNOSIS — R001 Bradycardia, unspecified: Secondary | ICD-10-CM

## 2018-11-09 DIAGNOSIS — I350 Nonrheumatic aortic (valve) stenosis: Secondary | ICD-10-CM

## 2018-11-09 MED ORDER — LOSARTAN POTASSIUM 50 MG PO TABS: 50.0000 mg | ORAL_TABLET | Freq: Every day | ORAL | 2 refills | Status: DC

## 2018-11-09 NOTE — Addendum Note (Signed)
Addended by: Particia Nearing B on: 11/09/2018 10:48 AM   Modules accepted: Orders

## 2018-11-09 NOTE — Patient Instructions (Addendum)
Medication Instructions:  Your physician recommends that you continue on your current medications as directed. Please refer to the Current Medication list given to you today.  If you need a refill on your cardiac medications before your next appointment, please call your pharmacy.   Lab work: Your physician recommends that you return for lab work in: TODAY CMP   If you have labs (blood work) drawn today and your tests are completely normal, you will receive your results only by: Marland Kitchen. MyChart Message (if you have MyChart) OR . A paper copy in the mail If you have any lab test that is abnormal or we need to change your treatment, we will call you to review the results.  Testing/Procedures: Your physician has requested that you have a lexiscan myoview. For further information please visit [email or web address]. Please follow instruction sheet, as given.  Your physician has requested that you have an echocardiogram. Echocardiography is a painless test that uses sound waves to create images of your heart. It provides your doctor with information about the size and shape of your heart and how well your heart's chambers and valves are working. This procedure takes approximately one hour. There are no restrictions for this procedure.    Follow-Up: At [facility], you and your health needs are our priority.  As part of our continuing mission to provide you with exceptional heart care, we have created designated Provider Care Teams.  These Care Teams include your primary Cardiologist (physician) and Advanced Practice Providers (APPs -  Physician Assistants and Nurse Practitioners) who all work together to provide you with the care you need, when you need it. You will need a follow up appointment in 3 months.  Any Other Special Instructions Will Be Listed Below (If Applicable).   Cardiac Nuclear Scan A cardiac nuclear scan is a test that is done to check the flow of blood to your heart. It is done  when you are resting and when you are exercising. The test looks for problems such as:  Not enough blood reaching a portion of the heart.  The heart muscle not working as it should. You may need this test if:  You have heart disease.  You have had lab results that are not normal.  You have had heart surgery or a balloon procedure to open up blocked arteries (angioplasty).  You have chest pain.  You have shortness of breath. In this test, a special dye (tracer) is put into your bloodstream. The tracer will travel to your heart. A camera will then take pictures of your heart to see how the tracer moves through your heart. This test is usually done at a hospital and takes 2-4 hours. Tell a doctor about:  Any allergies you have.  All medicines you are taking, including vitamins, herbs, eye drops, creams, and over-the-counter medicines.  Any problems you or family members have had with anesthetic medicines.  Any blood disorders you have.  Any surgeries you have had.  Any medical conditions you have.  Whether you are pregnant or may be pregnant. What are the risks? Generally, this is a safe test. However, problems may occur, such as:  Serious chest pain and heart attack. This is only a risk if the stress portion of the test is done.  Rapid heartbeat.  A feeling of warmth in your chest. This feeling usually does not last long.  Allergic reaction to the tracer. What happens before the test?  Ask your doctor about changing or stopping your  normal medicines. This is important.  Follow instructions from your doctor about what you cannot eat or drink.  Remove your jewelry on the day of the test. What happens during the test?  An IV tube will be inserted into one of your veins.  Your doctor will give you a small amount of tracer through the IV tube.  You will wait for 20-40 minutes while the tracer moves through your bloodstream.  Your heart will be monitored with an  electrocardiogram (ECG).  You will lie down on an exam table.  Pictures of your heart will be taken for about 15-20 minutes.  You may also have a stress test. For this test, one of these things may be done: ? You will be asked to exercise on a treadmill or a stationary bike. ? You will be given medicines that will make your heart work harder. This is done if you are unable to exercise.  When blood flow to your heart has peaked, a tracer will again be given through the IV tube.  After 20-40 minutes, you will get back on the exam table. More pictures will be taken of your heart.  Depending on the tracer that is used, more pictures may need to be taken 3-4 hours later.  Your IV tube will be removed when the test is over. The test may vary among doctors and hospitals. What happens after the test?  Ask your doctor: ? Whether you can return to your normal schedule, including diet, activities, and medicines. ? Whether you should drink more fluids. This will help to remove the tracer from your body. Drink enough fluid to keep your pee (urine) pale yellow.  Ask your doctor, or the department that is doing the test: ? When will my results be ready? ? How will I get my results? Summary  A cardiac nuclear scan is a test that is done to check the flow of blood to your heart.  Tell your doctor whether you are pregnant or may be pregnant.  Before the test, ask your doctor about changing or stopping your normal medicines. This is important.  Ask your doctor whether you can return to your normal activities. You may be asked to drink more fluids. This information is not intended to replace advice given to you by your health care provider. Make sure you discuss any questions you have with your health care provider. Document Released: 09/21/2017 Document Revised: 07/28/2018 Document Reviewed: 09/21/2017 Elsevier Patient Education  [place].   Echocardiogram An echocardiogram is a  procedure that uses painless sound waves (ultrasound) to produce an image of the heart. Images from an echocardiogram can provide important information about:  Signs of coronary artery disease (CAD).  Aneurysm detection. An aneurysm is a weak or damaged part of an artery wall that bulges out from the normal force of blood pumping through the body.  Heart size and shape. Changes in the size or shape of the heart can be associated with certain conditions, including heart failure, aneurysm, and CAD.  Heart muscle function.  Heart valve function.  Signs of a past heart attack.  Fluid buildup around the heart.  Thickening of the heart muscle.  A tumor or infectious growth around the heart valves. Tell a health care provider about:  Any allergies you have.  All medicines you are taking, including vitamins, herbs, eye drops, creams, and over-the-counter medicines.  Any blood disorders you have.  Any surgeries you have had.  Any medical conditions you  have.  Whether you are pregnant or may be pregnant. What are the risks? Generally, this is a safe procedure. However, problems may occur, including:  Allergic reaction to dye (contrast) that may be used during the procedure. What happens before the procedure? No specific preparation is needed. You may eat and drink normally. What happens during the procedure?   An IV tube may be inserted into one of your veins.  You may receive contrast through this tube. A contrast is an injection that improves the quality of the pictures from your heart.  A gel will be applied to your chest.  A wand-like tool (transducer) will be moved over your chest. The gel will help to transmit the sound waves from the transducer.  The sound waves will harmlessly bounce off of your heart to allow the heart images to be captured in real-time motion. The images will be recorded on a computer. The procedure may vary among health care providers and hospitals.  What happens after the procedure?  You may return to your normal, everyday life, including diet, activities, and medicines, unless your health care provider tells you not to do that. Summary  An echocardiogram is a procedure that uses painless sound waves (ultrasound) to produce an image of the heart.  Images from an echocardiogram can provide important information about the size and shape of your heart, heart muscle function, heart valve function, and fluid buildup around your heart.  You do not need to do anything to prepare before this procedure. You may eat and drink normally.  After the echocardiogram is completed, you may return to your normal, everyday life, unless your health care provider tells you not to do that. This information is not intended to replace advice given to you by your health care provider. Make sure you discuss any questions you have with your health care provider. Document Released: 04/04/2000 Document Revised: 07/29/2018 Document Reviewed: 05/10/2016 Elsevier Patient Education  2020 [employer]

## 2018-11-10 ENCOUNTER — Telehealth: Payer: Self-pay

## 2018-11-10 DIAGNOSIS — E785 Hyperlipidemia, unspecified: Secondary | ICD-10-CM

## 2018-11-10 DIAGNOSIS — I25118 Atherosclerotic heart disease of native coronary artery with other forms of angina pectoris: Secondary | ICD-10-CM

## 2018-11-10 LAB — COMPREHENSIVE METABOLIC PANEL
ALT: 32 IU/L (ref 0–44)
AST: 25 IU/L (ref 0–40)
Albumin/Globulin Ratio: 2.1 (ref 1.2–2.2)
Albumin: 4.6 g/dL (ref 3.7–4.7)
Alkaline Phosphatase: 26 IU/L — ABNORMAL LOW (ref 39–117)
BUN/Creatinine Ratio: 18 (ref 10–24)
BUN: 16 mg/dL (ref 8–27)
Bilirubin Total: 0.7 mg/dL (ref 0.0–1.2)
CO2: 22 mmol/L (ref 20–29)
Calcium: 9.7 mg/dL (ref 8.6–10.2)
Chloride: 99 mmol/L (ref 96–106)
Creatinine, Ser: 0.91 mg/dL (ref 0.76–1.27)
GFR calc Af Amer: 96 mL/min/{1.73_m2} (ref >59)
GFR calc non Af Amer: 83 mL/min/{1.73_m2} (ref >59)
Globulin, Total: 2.2 g/dL (ref 1.5–4.5)
Glucose: 99 mg/dL (ref 65–99)
Potassium: 4.5 mmol/L (ref 3.5–5.2)
Sodium: 141 mmol/L (ref 134–144)
Total Protein: 6.8 g/dL (ref 6.0–8.5)

## 2018-11-10 NOTE — Telephone Encounter (Signed)
-----   Message from Richardo Priest, MD sent at 11/10/2018  7:44 AM EDT ----- Normal or stable result  He should also have a lipid profile significantly run off the specimen

## 2018-11-10 NOTE — Telephone Encounter (Signed)
Patient notified that CMP results are normal per Dr Bettina Gavia, and that he would like to add lipid profile.  Patient agrees and verbalized understanding. Lipid added.

## 2018-11-11 ENCOUNTER — Other Ambulatory Visit (HOSPITAL_BASED_OUTPATIENT_CLINIC_OR_DEPARTMENT_OTHER): Payer: Medicare Other

## 2018-11-11 ENCOUNTER — Other Ambulatory Visit: Payer: Self-pay

## 2018-11-11 ENCOUNTER — Ambulatory Visit (HOSPITAL_BASED_OUTPATIENT_CLINIC_OR_DEPARTMENT_OTHER)
Admission: RE | Admit: 2018-11-11 | Discharge: 2018-11-11 | Disposition: A | Discharge status: Home / Self Care | Payer: Medicare Other | Source: Ambulatory Visit | Attending: Cardiology | Admitting: Cardiology

## 2018-11-11 DIAGNOSIS — I351 Nonrheumatic aortic (valve) insufficiency: Secondary | ICD-10-CM | POA: Diagnosis present

## 2018-11-11 DIAGNOSIS — I350 Nonrheumatic aortic (valve) stenosis: Secondary | ICD-10-CM

## 2018-11-11 NOTE — Progress Notes (Signed)
  Echocardiogram 2D Echocardiogram has been performed.  Craig Lawson 11/11/2018, 1:55 PM

## 2018-11-16 ENCOUNTER — Telehealth: Payer: Self-pay

## 2018-11-16 DIAGNOSIS — E785 Hyperlipidemia, unspecified: Secondary | ICD-10-CM

## 2018-11-16 NOTE — Telephone Encounter (Signed)
Patient informed that lipid results were not able to be added on to CMP as there was not enough blood from original draw.  Patient will return to office for lipids to be drawn.  Patient verbalized understanding and agreed to plan.

## 2018-11-17 LAB — LIPID PANEL
Chol/HDL Ratio: 5.3 ratio — ABNORMAL HIGH (ref 0.0–5.0)
Cholesterol, Total: 189 mg/dL (ref 100–199)
HDL: 36 mg/dL — ABNORMAL LOW (ref >39)
LDL Calculated: 82 mg/dL (ref 0–99)
Triglycerides: 353 mg/dL — ABNORMAL HIGH (ref 0–149)
VLDL Cholesterol Cal: 71 mg/dL — ABNORMAL HIGH (ref 5–40)

## 2018-11-22 ENCOUNTER — Encounter (HOSPITAL_COMMUNITY): Payer: Medicare Other

## 2018-11-22 MED ORDER — ICOSAPENT ETHYL 1 G PO CAPS: 1.0000 | ORAL_CAPSULE | Freq: Two times a day (BID) | ORAL | 0 refills | Status: DC

## 2018-11-22 NOTE — Telephone Encounter (Signed)
Called patient and discussed lipid panel results. Patient informed that Dr. Bettina Gavia advised he start taking vascepa 1 gram twice daily. Patient is agreeable and verbalized understanding. Prescription sent to [facility] as requested. Patient will return to the [facility] office for repeat lab work 6 weeks after starting vascepa. No appointment needed, patient will fast beforehand. No further questions.

## 2018-11-22 NOTE — Addendum Note (Signed)
Addended by: Austin Miles on: 11/22/2018 02:11 PM   Modules accepted: Orders

## 2018-11-30 ENCOUNTER — Telehealth (HOSPITAL_COMMUNITY): Payer: Self-pay | Admitting: *Deleted

## 2018-11-30 NOTE — Telephone Encounter (Signed)
Patient given detailed instructions per Myocardial Perfusion Study Information Sheet for the test on 12/07/18. Patient notified to arrive 15 minutes early and that it is imperative to arrive on time for appointment to keep from having the test rescheduled.  If you need to cancel or reschedule your appointment, please call the office within 24 hours of your appointment. . Patient verbalized understanding.Maureen Duesing Jacqueline    

## 2018-12-03 LAB — LIPID PANEL

## 2018-12-03 LAB — SPECIMEN STATUS REPORT

## 2018-12-07 ENCOUNTER — Ambulatory Visit (INDEPENDENT_AMBULATORY_CARE_PROVIDER_SITE_OTHER): Payer: Medicare Other

## 2018-12-07 ENCOUNTER — Other Ambulatory Visit: Payer: Self-pay

## 2018-12-07 DIAGNOSIS — I25118 Atherosclerotic heart disease of native coronary artery with other forms of angina pectoris: Secondary | ICD-10-CM

## 2018-12-07 LAB — MYOCARDIAL PERFUSION IMAGING
LV dias vol: 120 mL (ref 62–150)
LV sys vol: 58 mL
Peak HR: 72 {beats}/min
Rest HR: 50 {beats}/min
SDS: 0
SRS: 9
SSS: 9
TID: 1.08

## 2018-12-07 MED ORDER — TECHNETIUM TC 99M TETROFOSMIN IV KIT
32.7000 | PACK | Freq: Once | INTRAVENOUS | Status: AC | PRN
  Administered 2018-12-07: 32.7 via INTRAVENOUS

## 2018-12-07 MED ORDER — REGADENOSON 0.4 MG/5ML IV SOLN
0.4000 mg | Freq: Once | INTRAVENOUS | Status: AC
  Administered 2018-12-07: 0.4 mg via INTRAVENOUS

## 2018-12-07 MED ORDER — TECHNETIUM TC 99M TETROFOSMIN IV KIT
9.8000 | PACK | Freq: Once | INTRAVENOUS | Status: AC | PRN
  Administered 2018-12-07: 9.8 via INTRAVENOUS

## 2019-01-10 ENCOUNTER — Other Ambulatory Visit: Payer: Self-pay | Admitting: Cardiology

## 2019-01-12 ENCOUNTER — Other Ambulatory Visit: Payer: Self-pay | Admitting: Cardiology

## 2019-01-15 LAB — LIPID PANEL
Chol/HDL Ratio: 5.1 ratio — ABNORMAL HIGH (ref 0.0–5.0)
Cholesterol, Total: 182 mg/dL (ref 100–199)
HDL: 36 mg/dL — ABNORMAL LOW (ref >39)
LDL Chol Calc (NIH): 110 mg/dL — ABNORMAL HIGH (ref 0–99)
Triglycerides: 208 mg/dL — ABNORMAL HIGH (ref 0–149)
VLDL Cholesterol Cal: 36 mg/dL (ref 5–40)

## 2019-02-11 ENCOUNTER — Other Ambulatory Visit: Payer: Self-pay | Admitting: Cardiology

## 2019-02-14 ENCOUNTER — Telehealth: Payer: Self-pay | Admitting: Cardiology

## 2019-02-14 ENCOUNTER — Other Ambulatory Visit: Payer: Self-pay

## 2019-02-14 ENCOUNTER — Telehealth: Payer: Medicare Other | Admitting: Family

## 2019-02-14 NOTE — Telephone Encounter (Signed)
Patient informed that diltiazem was discontinued by Dr. Bettina Gavia during his last office visit on 11/09/2018 and replaced with losartan 50 mg daily due to sinus bradycardia. Patient advised to continue taking lisinopril 20 mg daily and losartan 50 mg daily. Patient verbalized understanding.   Patient states that he was never called for his virtual visit scheduled with Laurann Montana, NP today at 2:45 pm. Patient has been rescheduled for an office visit with Dr. Bettina Gavia on Friday, 02/18/2019, at 8:05 am in the [place] office. Patient is agreeable. No further questions.

## 2019-02-14 NOTE — Telephone Encounter (Signed)
Patient on losartan and lisinipril, should he be on both and what about his diltiazem?

## 2019-02-17 NOTE — Progress Notes (Signed)
Cardiology Office Note:    Date:  02/18/2019   ID:  Craig Lawson, DOB 11-10-1945, MRN [id number]  PCP:  Tarri Fuller, MD  Cardiologist:  Norman Herrlich, MD    Referring MD: Tarri Fuller, MD    ASSESSMENT:    1. Coronary artery disease of native artery of native heart with stable angina pectoris (HCC)   2. Essential hypertension   3. Pure hypercholesterolemia   4. Mild aortic stenosis    PLAN:    In order of problems listed above:  1. Stable CAD more than 1 year from PCI and stent.  I asked her to remain on dual antiplatelet therapy reviewed his myocardial perfusion study that is reassuring. 2. Stable hypertension continue current treatment 3. Stable he is on a complex regimen including Zetia and a minimum dose of pravastatin he tolerates I think clinically has had a good response we discussed the option of PCSK9 but he is concerned about financial limitations and his isolated case and pleased with the outcome continue the same 4. Stable aortic stenosis remains mild   Next appointment: 6 months   Medication Adjustments/Labs and Tests Ordered: Current medicines are reviewed at length with the patient today.  Concerns regarding medicines are outlined above.  No orders of the defined types were placed in this encounter.  No orders of the defined types were placed in this encounter.   Chief Complaint  Patient presents with  . Follow-up    for  . Coronary Artery Disease  . Hyperlipidemia  . Aortic Stenosis    History of Present Illness:    Craig Lawson is a 73 y.o. male with a hx of  CAD nonSTEMI 10/20/27 with PCI and stent of OM and RCA mild AS/AR, hypertension, hyperlipidemia and LBBB  last seen 11/08/2017.  He is poorly statin intolerant and takes a combination of low-dose pravastatin 2 days/week and Zetia. Compliance with diet, lifestyle and medications: Yes  [name] was seen by me in the office he has some complaints of dysesthesia that could be neuropathy  offered him referral to neurology for nerve conduction studies perhaps spine imaging and he declines.  He is careful with COVID-19 practices all the precautionary measures he has had no exposure and has had no edema shortness of breath chest pain orthopnea palpitation or syncope.  I reviewed his myocardial perfusion study and echocardiogram with the patient showing stable CAD and aortic stenosis  Cardiac cath 11/01/2018: Diagnostic Summary Severe stenosis of the Marginal Branch, PDA Interventional Summary Successful PCI / 2.25 X 23 Xience Drug Eluting Stent of the mid Posterior Descending Coronary Artery. Successful PCI / 3.25 X 18 Xience Drug Eluting Stent of the proximal 2nd Obtuse Marginal Coronary Artery. Cardiac Arteries and Lesion Findings LMCA: Lesion on LMCA: 5% stenosis 6 mm length . LAD: Lesion on Prox LAD: 70% stenosis 23 mm length . Lesion on 1st Diag: 60% stenosis 10 mm length . LCx: Lesion on 2nd Ob Marg: Proximal subsection.95% stenosis 16 mm lengthreduced to 0%. Pre procedure TIMI III flow was noted. Post Procedure TIMI III flow was present. Good run off was present. The lesion was diagnosed as Moderate Risk (B). The lesion was heavily calcified.The lesion showed mildangulation.  Echo, 11/11/2018:  1. The left ventricle has normal systolic function, with an ejection fraction of 55-60%. The cavity size was normal. There is moderate concentric left ventricular hypertrophy. Left ventricular diastolic Doppler parameters are consistent with impaired  relaxation. No evidence of left ventricular regional wall motion abnormalities.  2. The right ventricle has normal systolic function. The cavity was normal. There is no increase in right ventricular wall thickness.  3. The mitral valve is degenerative. There is mild mitral annular calcification present. No evidence of mitral valve stenosis with a calculated valve area of 1.36 cm.  4. The aortic valve is tricuspid. Mild  thickening of the aortic valve. Mild calcification of the aortic valve. Aortic valve regurgitation is mild by color flow Doppler. Mild stenosis of the aortic valve.  5. The aorta is normal in size and structure.  6. The aortic root, ascending aorta and aortic arch are normal in size and structure.  MPI, 12/07/2018: Study Highlights  The left ventricular ejection fraction is mildly decreased (45-54%).  Nuclear stress EF: 51%.  There was no ST segment deviation noted during stress.  The study is normal.  This is a low risk study.     Past Medical History:  Diagnosis Date  . Aortic stenosis, mild 08/10/2015  . Bilateral leg edema 08/22/2015  . Cellulitis of left lower extremity 04/29/2017  . Essential hypertension 08/13/2015  . Heart murmur 04/29/2017  . Kissing, osteophytes 08/22/2015  . LBBB (left bundle branch block) 08/22/2015  . Lumbar facet joint pain 08/22/2015  . Medicare annual wellness visit, subsequent 06/02/2016  . Need for vaccination for Strep pneumoniae 03/27/2017  . Obesity, Class II, BMI 35-39.9 08/10/2015  . Pure hypercholesterolemia 05/24/2015  . Seborrheic keratoses 03/27/2017   Overview:  Of the right forehead.  . Thrombocytopenia (HCC) 04/29/2017  . TIA (transient ischemic attack) 12/19/2016  . 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 Venous insufficiency 08/10/2015    Past Surgical History:  Procedure Laterality Date  . CARDIAC CATHETERIZATION    . CHOLECYSTECTOMY    . CORONARY ANGIOPLASTY    . ESOPHAGOGASTRODUODENOSCOPY      Current Medications: No outpatient medications have been marked as taking for the 02/18/19 encounter (Office Visit) with Baldo DaubMunley, Brian J, MD.     Allergies:   Statins, Isosorbide, and Latex   Social History   Socioeconomic History  . Marital status: Married    Spouse name: Not on file  . Number of children: Not on file  . Years of education: Not on file  . Highest education level: Not on file  Occupational History  . Not on file  Social  Needs  . Financial resource strain: Not on file  . Food insecurity    Worry: Not on file    Inability: Not on file  . Transportation needs    Medical: Not on file    Non-medical: Not on file  Tobacco Use  . Smoking status: Former Smoker    Types: Cigarettes    Quit date: 1973    Years since quitting: 47.8  . Smokeless tobacco: Never Used  Substance and Sexual Activity  . Alcohol use: Yes    Alcohol/week: 7.0 standard drinks    Types: 7 Glasses of wine per week    Comment: per week  . Drug use: No  . Sexual activity: Not on file  Lifestyle  . Physical activity    Days per week: Not on file    Minutes per session: Not on file  . Stress: Not on file  Relationships  . Social Musicianconnections    Talks on phone: Not on file    Gets together: Not on file    Attends religious service: Not on file    Active member of club or organization: Not on file  Attends meetings of clubs or organizations: Not on file    Relationship status: Not on file  Other Topics Concern  . Not on file  Social History Narrative  . Not on file     Family History: The patient's family history includes Cancer in his mother; Diabetes in his brother and sister; Hypertension in his brother. ROS:   Please see the history of present illness.    All other systems reviewed and are negative.  EKGs/Labs/Other Studies Reviewed:    The following studies were reviewed today:    Recent Labs: 11/09/2018: ALT 32; BUN 16; Creatinine, Ser 0.91; Potassium 4.5; Sodium 141  Recent Lipid Panel    Component Value Date/Time   CHOL 182 01/14/2019 1030   TRIG 208 (H) 01/14/2019 1030   HDL 36 (L) 01/14/2019 1030   CHOLHDL 5.1 (H) 01/14/2019 1030   LDLCALC 110 (H) 01/14/2019 1030    Physical Exam:    VS:  Ht 5\' 9"  (1.753 m)   Wt 255 lb (115.7 kg)   BMI 37.66 kg/m     Wt Readings from Last 3 Encounters:  02/18/19 255 lb (115.7 kg)  12/07/18 255 lb (115.7 kg)  11/09/18 255 lb 12.8 oz (116 kg)     GEN:  Well  nourished, well developed in no acute distress HEENT: Normal NECK: No JVD; No carotid bruits LYMPHATICS: No lymphadenopathy CARDIAC: RRR, no murmurs, rubs, gallops RESPIRATORY:  Clear to auscultation without rales, wheezing or rhonchi  ABDOMEN: Soft, non-tender, non-distended MUSCULOSKELETAL:  No edema; No deformity  SKIN: Warm and dry NEUROLOGIC:  Alert and oriented x 3 PSYCHIATRIC:  Normal affect    Signed, Shirlee More, MD  02/18/2019 8:14 AM    [place]

## 2019-02-18 ENCOUNTER — Ambulatory Visit (INDEPENDENT_AMBULATORY_CARE_PROVIDER_SITE_OTHER): Payer: Medicare Other | Admitting: Cardiology

## 2019-02-18 ENCOUNTER — Other Ambulatory Visit: Payer: Self-pay

## 2019-02-18 ENCOUNTER — Encounter: Payer: Self-pay | Admitting: Cardiology

## 2019-02-18 VITALS — BP 120/80 | HR 46 | Ht 69.0 in | Wt 255.0 lb

## 2019-02-18 DIAGNOSIS — I10 Essential (primary) hypertension: Secondary | ICD-10-CM

## 2019-02-18 DIAGNOSIS — I25118 Atherosclerotic heart disease of native coronary artery with other forms of angina pectoris: Secondary | ICD-10-CM | POA: Diagnosis not present

## 2019-02-18 DIAGNOSIS — E7800 Pure hypercholesterolemia, unspecified: Secondary | ICD-10-CM | POA: Diagnosis not present

## 2019-02-18 DIAGNOSIS — I350 Nonrheumatic aortic (valve) stenosis: Secondary | ICD-10-CM | POA: Diagnosis not present

## 2019-02-18 NOTE — Patient Instructions (Signed)
Medication Instructions:  Your physician recommends that you continue on your current medications as directed. Please refer to the Current Medication list given to you today.  *If you need a refill on your cardiac medications before your next appointment, please call your pharmacy*  Lab Work: None  If you have labs (blood work) drawn today and your tests are completely normal, you will receive your results only by: . MyChart Message (if you have MyChart) OR . A paper copy in the mail If you have any lab test that is abnormal or we need to change your treatment, we will call you to review the results.  Testing/Procedures: None  Follow-Up: At [facility], you and your health needs are our priority.  As part of our continuing mission to provide you with exceptional heart care, we have created designated Provider Care Teams.  These Care Teams include your primary Cardiologist (physician) and Advanced Practice Providers (APPs -  Physician Assistants and Nurse Practitioners) who all work together to provide you with the care you need, when you need it.  Your next appointment:   6 month(s)  The format for your next appointment:   In Person  Provider:   Brian Munley, MD   

## 2019-02-22 NOTE — Telephone Encounter (Signed)
Please advise if patient can take losartan and lisinopril together as prescribed. Thanks!

## 2019-02-22 NOTE — Telephone Encounter (Signed)
Please call patient, pharmacy states his losartan should not be taken with lisinipril.

## 2019-02-23 MED ORDER — LOSARTAN POTASSIUM 50 MG PO TABS: 50.0000 mg | ORAL_TABLET | Freq: Every day | ORAL | 1 refills | Status: DC

## 2019-02-23 NOTE — Telephone Encounter (Signed)
No stop lisinopril

## 2019-02-23 NOTE — Telephone Encounter (Signed)
Patient informed to stop taking lisinopril and continue taking losartan 50 mg daily per Dr. Bettina Gavia. Refill for losartan has been sent to [facility] as requested. Patient verbalized understanding. No further questions.

## 2019-02-23 NOTE — Addendum Note (Signed)
Addended by: Austin Miles on: 02/23/2019 11:54 AM   Modules accepted: Orders

## 2019-03-16 ENCOUNTER — Other Ambulatory Visit: Payer: Self-pay | Admitting: Cardiology

## 2019-04-01 ENCOUNTER — Other Ambulatory Visit: Payer: Self-pay | Admitting: Cardiology

## 2019-07-01 ENCOUNTER — Telehealth: Payer: Self-pay | Admitting: Cardiology

## 2019-07-01 ENCOUNTER — Other Ambulatory Visit: Payer: Self-pay | Admitting: Cardiology

## 2019-07-01 NOTE — Telephone Encounter (Signed)
Craig Lawson I cannot add anybody on Monday I am likely going to have to cancel my appointments as I have a urgent dental in the afternoon

## 2019-07-01 NOTE — Telephone Encounter (Signed)
Pt c/o Shortness Of Breath: STAT if SOB developed within the last 24 hours or pt is noticeably SOB on the phone  1. Are you currently SOB (can you hear that pt is SOB on the phone)? no  2. How long have you been experiencing SOB? About 3 weeks   3. Are you SOB when sitting or when up moving around? Up and moving around. It will resolve itself once he sits and rests for a while  4. Are you currently experiencing any other symptoms? Dizziness when he is SOB, fatigueBurning sensation when he breathes   The pt had stents put in about 1.5 years ago

## 2019-07-01 NOTE — Telephone Encounter (Signed)
Returned call to Pt.  Made appt with Dr. Servando Salina on March 16 at 8:20 am.  Pt appreciative of appt.    Gave phone number to office [phone] advised on call 24 hours a day if Pt has any further concerns.

## 2019-07-01 NOTE — Telephone Encounter (Signed)
Returned call to Pt.  Per Pt he is having worsening shortness of breath with exertion.  States if he walks very slow he can walk a little ways before he has SOB.  States if he walks briskly he has to stop immediately to catch his breath.  He also is having chest burning with the sob.  Per Pt these symptoms are similar to when he was found to need stents during previous catheterization.  Pt states he was told during his last cath that he "needed surgery" but because he is a Jehovah's Witness and cannot except blood products he declined surgery and instead accepted some stents.  Will forward to Dr. Marya Amsler to schedule Pt office visit.

## 2019-07-05 ENCOUNTER — Other Ambulatory Visit: Payer: Self-pay

## 2019-07-05 ENCOUNTER — Encounter: Payer: Self-pay | Admitting: Cardiology

## 2019-07-05 ENCOUNTER — Ambulatory Visit (INDEPENDENT_AMBULATORY_CARE_PROVIDER_SITE_OTHER): Payer: Medicare Other | Admitting: Cardiology

## 2019-07-05 VITALS — BP 124/82 | HR 51 | Ht 69.0 in | Wt 258.0 lb

## 2019-07-05 DIAGNOSIS — E669 Obesity, unspecified: Secondary | ICD-10-CM

## 2019-07-05 DIAGNOSIS — I25118 Atherosclerotic heart disease of native coronary artery with other forms of angina pectoris: Secondary | ICD-10-CM

## 2019-07-05 DIAGNOSIS — R0602 Shortness of breath: Secondary | ICD-10-CM | POA: Diagnosis not present

## 2019-07-05 DIAGNOSIS — I10 Essential (primary) hypertension: Secondary | ICD-10-CM | POA: Diagnosis not present

## 2019-07-05 DIAGNOSIS — Z9989 Dependence on other enabling machines and devices: Secondary | ICD-10-CM

## 2019-07-05 DIAGNOSIS — G459 Transient cerebral ischemic attack, unspecified: Secondary | ICD-10-CM | POA: Diagnosis not present

## 2019-07-05 DIAGNOSIS — I350 Nonrheumatic aortic (valve) stenosis: Secondary | ICD-10-CM

## 2019-07-05 DIAGNOSIS — R600 Localized edema: Secondary | ICD-10-CM

## 2019-07-05 DIAGNOSIS — G4733 Obstructive sleep apnea (adult) (pediatric): Secondary | ICD-10-CM

## 2019-07-05 MED ORDER — FUROSEMIDE 20 MG PO TABS: 20.0000 mg | ORAL_TABLET | Freq: Every day | ORAL | 0 refills | Status: DC

## 2019-07-05 MED ORDER — RANOLAZINE ER 500 MG PO TB12: 500.0000 mg | ORAL_TABLET | Freq: Two times a day (BID) | ORAL | 1 refills | Status: DC

## 2019-07-05 MED ORDER — POTASSIUM CHLORIDE CRYS ER 20 MEQ PO TBCR: 20.0000 meq | EXTENDED_RELEASE_TABLET | Freq: Every day | ORAL | 0 refills | Status: DC

## 2019-07-05 NOTE — Patient Instructions (Signed)
Medication Instructions:  Your physician has recommended you make the following change in your medication:    START: Lasix 20 mg daily (for next 2 weeks)  START: Potassium 20 meq daily (for next 2 weeks)  START: Ranexa 500 mg twice daily   *If you need a refill on your cardiac medications before your next appointment, please call your pharmacy*   Lab Work: Your physician recommends that you return for lab work today: bmp, mg, cbc, bnp, lipids   If you have labs (blood work) drawn today and your tests are completely normal, you will receive your results only by: Marland Kitchen MyChart Message (if you have MyChart) OR . A paper copy in the mail If you have any lab test that is abnormal or we need to change your treatment, we will call you to review the results.   Testing/Procedures: Your physician has requested that you have an echocardiogram. Echocardiography is a painless test that uses sound waves to create images of your heart. It provides your doctor with information about the size and shape of your heart and how well your heart's chambers and valves are working. This procedure takes approximately one hour. There are no restrictions for this procedure.     Follow-Up: At [facility], you and your health needs are our priority.  As part of our continuing mission to provide you with exceptional heart care, we have created designated Provider Care Teams.  These Care Teams include your primary Cardiologist (physician) and Advanced Practice Providers (APPs -  Physician Assistants and Nurse Practitioners) who all work together to provide you with the care you need, when you need it.  We recommend signing up for the patient portal called "MyChart".  Sign up information is provided on this After Visit Summary.  MyChart is used to connect with patients for Virtual Visits (Telemedicine).  Patients are able to view lab/test results, encounter notes, upcoming appointments, etc.  Non-urgent messages can be  sent to your provider as well.   To learn more about what you can do with MyChart, go to [email or web address].    Your next appointment:   2 week(s)  The format for your next appointment:   In Person  Provider:   Berniece Salines, DO   Other Instructions   Echocardiogram An echocardiogram is a procedure that uses painless sound waves (ultrasound) to produce an image of the heart. Images from an echocardiogram can provide important information about:  Signs of coronary artery disease (CAD).  Aneurysm detection. An aneurysm is a weak or damaged part of an artery wall that bulges out from the normal force of blood pumping through the body.  Heart size and shape. Changes in the size or shape of the heart can be associated with certain conditions, including heart failure, aneurysm, and CAD.  Heart muscle function.  Heart valve function.  Signs of a past heart attack.  Fluid buildup around the heart.  Thickening of the heart muscle.  A tumor or infectious growth around the heart valves. Tell a health care provider about:  Any allergies you have.  All medicines you are taking, including vitamins, herbs, eye drops, creams, and over-the-counter medicines.  Any blood disorders you have.  Any surgeries you have had.  Any medical conditions you have.  Whether you are pregnant or may be pregnant. What are the risks? Generally, this is a safe procedure. However, problems may occur, including:  Allergic reaction to dye (contrast) that may be used during the procedure. What happens before  the procedure? No specific preparation is needed. You may eat and drink normally. What happens during the procedure?   An IV tube may be inserted into one of your veins.  You may receive contrast through this tube. A contrast is an injection that improves the quality of the pictures from your heart.  A gel will be applied to your chest.  A wand-like tool (transducer) will be moved over  your chest. The gel will help to transmit the sound waves from the transducer.  The sound waves will harmlessly bounce off of your heart to allow the heart images to be captured in real-time motion. The images will be recorded on a computer. The procedure may vary among health care providers and hospitals. What happens after the procedure?  You may return to your normal, everyday life, including diet, activities, and medicines, unless your health care provider tells you not to do that. Summary  An echocardiogram is a procedure that uses painless sound waves (ultrasound) to produce an image of the heart.  Images from an echocardiogram can provide important information about the size and shape of your heart, heart muscle function, heart valve function, and fluid buildup around your heart.  You do not need to do anything to prepare before this procedure. You may eat and drink normally.  After the echocardiogram is completed, you may return to your normal, everyday life, unless your health care provider tells you not to do that. This information is not intended to replace advice given to you by your  health care provider. Make sure you discuss any questions you have  with your health care provider. Document Revised: 07/29/2018 Document Reviewed: 05/10/2016 Elsevier Patient Education  [place].  Ranolazine tablets, extended release What is this medicine? RANOLAZINE (ra NOE la zeen) is a heart medicine. It is used to treat chronic chest pain (angina). This medicine must be taken regularly. It will not relieve an acute episode of chest pain. This medicine may be used for other purposes; ask your health care provider or pharmacist if you have questions. COMMON BRAND NAME(S): Ranexa What should I tell my health care provider before I take this medicine? They need to know if you have any of these conditions:  heart disease  irregular heartbeat  kidney disease  liver disease  low  levels of potassium or magnesium in the blood  an unusual or allergic reaction to ranolazine, other medicines, foods, dyes, or preservatives  pregnant or trying to get pregnant  breast-feeding How should I use this medicine? Take this medicine by mouth with a glass of water. Follow the directions on the prescription label. Do not cut, crush, or chew this medicine. Take with or without food. Do not take this medication with grapefruit juice. Take your doses at regular intervals. Do not take your medicine more often then directed. Talk to your pediatrician regarding the use of this medicine in children. Special care may be needed. Overdosage: If you think you have taken too much of this medicine contact a poison control center or emergency room at once. NOTE: This medicine is only for you. Do not share this medicine with others. What if I miss a dose? If you miss a dose, take it as soon as you can. If it is almost time for your next dose, take only that dose. Do not take double or extra doses. What may interact with this medicine? Do not take this medicine with any of the following medications:  antivirals for  HIV or AIDS  cerivastatin  certain antibiotics like chloramphenicol, clarithromycin, dalfopristin; quinupristin, isoniazid, rifabutin, rifampin, rifapentine  certain medicines used for cancer like imatinib, nilotinib  certain medicines for fungal infections like fluconazole, itraconazole, ketoconazole, posaconazole, voriconazole  certain medicines for irregular heart beat like dronedarone  certain medicines for seizures like carbamazepine, fosphenytoin, oxcarbazepine, phenobarbital, phenytoin  cisapride  conivaptan  cyclosporine  grapefruit or grapefruit juice  lumacaftor; ivacaftor  nefazodone  pimozide  quinacrine  St John's wort  thioridazine This medicine may also interact with the following medications:  alfuzosin  certain medicines for depression,  anxiety, or psychotic disturbances like bupropion, citalopram, fluoxetine, fluphenazine, paroxetine, perphenazine, risperidone, sertraline, trifluoperazine  certain medicines for cholesterol like atorvastatin, lovastatin, simvastatin  certain medicines for stomach problems like octreotide, palonosetron, prochlorperazine  eplerenone  ergot alkaloids like dihydroergotamine, ergonovine, ergotamine, methylergonovine  metformin  nicardipine  other medicines that prolong the QT interval (cause an abnormal heart rhythm) like dofetilide, ziprasidone  sirolimus  tacrolimus This list may not describe all possible interactions. Give your health care provider a list of all the medicines, herbs, non-prescription drugs, or dietary supplements you use. Also tell them if you smoke, drink alcohol, or use illegal drugs. Some items may interact with your medicine. What should I watch for while using this medicine? Visit your doctor for regular check ups. Tell your doctor or healthcare professional if your symptoms do not start to get better or if they get worse. This medicine will not relieve an acute attack of angina or chest pain. This medicine can change your heart rhythm. Your health care provider may check your heart rhythm by ordering an electrocardiogram (ECG) while you are taking this medicine. You may get drowsy or dizzy. Do not drive, use machinery, or do anything that needs mental alertness until you know how this medicine affects you. Do not stand or sit up quickly, especially if you are an older patient. This reduces the risk of dizzy or fainting spells. Alcohol may interfere with the effect of this medicine. Avoid alcoholic drinks. If you are scheduled for any medical or dental procedure, tell your healthcare provider that you are taking this medicine. This medicine can interact with other medicines used during surgery. What side effects may I notice from receiving this medicine? Side effects  that you should report to your doctor or health care professional as soon as possible:  allergic reactions like skin rash, itching or hives, swelling of the face, lips, or tongue  breathing problems  changes in vision  fast, irregular or pounding heartbeat  feeling faint or lightheaded, falls  low or high blood pressure  numbness or tingling feelings  ringing in the ears  tremor or shakiness  slow heartbeat (fewer than 50 beats per minute)  swelling of the legs or feet Side effects that usually do not require medical attention (report to your doctor or health care professional if they continue or are bothersome):  constipation  drowsy  dry mouth  headache  nausea or vomiting  stomach upset This list may not describe all possible side effects. Call your doctor for medical advice about side effects. You may report side effects to FDA at 1-800-FDA-1088. Where should I keep my medicine? Keep out of the reach of children. Store at room temperature between 15 and 30 degrees C (59 and 86 degrees F). Throw away any unused medicine after the expiration date. NOTE: This sheet is a summary. It may not cover all possible information. If you have questions  about this medicine, talk to your doctor, pharmacist, or health care provider.  2020 Elsevier/Gold Standard (2018-03-30 09:18:49)  Furosemide Oral Tablets What is this medicine? FUROSEMIDE (fyoor [place] se mide) is a diuretic. It helps you make more urine and to lose salt and excess water from your body. It treats swelling from heart, kidney, or liver disease. It also treats high blood pressure. This medicine may be used for other purposes; ask your health care provider or pharmacist if you have questions. COMMON BRAND NAME(S): Active-Medicated Specimen Kit, Delone, Diuscreen, Lasix, RX Specimen Collection Kit, Specimen Collection Kit, URINX Medicated Specimen Collection What should I tell my health care provider before I take this  medicine? They need to know if you have any of these conditions:  abnormal blood electrolytes  diarrhea or vomiting  gout  heart disease  kidney disease, small amounts of urine, or difficulty passing urine  liver disease  thyroid disease  an unusual or allergic reaction to furosemide, sulfa drugs, other medicines, foods, dyes, or preservatives  pregnant or trying to get pregnant  breast-feeding How should I use this medicine? Take this drug by mouth. Take it as directed on the prescription label at the same time every day. You can take it with or without food. If it upsets your stomach, take it with food. Keep taking it unless your health care provider tells you to stop. Talk to your health care provider about the use of this drug in children. Special care may be needed. Overdosage: If you think you have taken too much of this medicine contact a poison control center or emergency room at once. NOTE: This medicine is only for you. Do not share this medicine with others. What if I miss a dose? If you miss a dose, take it as soon as you can. If it is almost time for your next dose, take only that dose. Do not take double or extra doses. What may interact with this medicine?  aspirin and aspirin-like medicines  certain antibiotics  chloral hydrate  cisplatin  cyclosporine  digoxin  diuretics  laxatives  lithium  medicines for blood pressure  medicines that relax muscles for surgery  methotrexate  NSAIDs, medicines for pain and inflammation like ibuprofen, naproxen, or indomethacin  phenytoin  steroid medicines like prednisone or cortisone  sucralfate  thyroid hormones This list may not describe all possible interactions. Give your health care provider a list of all the medicines, herbs, non-prescription drugs, or dietary supplements you use. Also tell them if you smoke, drink alcohol, or use illegal drugs. Some items may interact with your medicine. What  should I watch for while using this medicine? Visit your doctor or health care provider for regular checks on your progress. Check your blood pressure regularly. Ask your doctor or health care provider what your blood pressure should be, and when you should contact him or her. If you are a diabetic, check your blood sugar as directed. This medicine may cause serious skin reactions. They can happen weeks to months after starting the medicine. Contact your health care provider right away if you notice fevers or flu-like symptoms with a rash. The rash may be red or purple and then turn into blisters or peeling of the skin. Or, you might notice a red rash with swelling of the face, lips or lymph nodes in your neck or under your arms. You may need to be on a special diet while taking this medicine. Check with your doctor. Also, ask how  many glasses of fluid you need to drink a day. You must not get dehydrated. You may get drowsy or dizzy. Do not drive, use machinery, or do anything that needs mental alertness until you know how this drug affects you. Do not stand or sit up quickly, especially if you are an older patient. This reduces the risk of dizzy or fainting spells. Alcohol can make you more drowsy and dizzy. Avoid alcoholic drinks. This medicine can make you more sensitive to the sun. Keep out of the sun. If you cannot avoid being in the sun, wear protective clothing and use sunscreen. Do not use sun lamps or tanning beds/booths. What side effects may I notice from receiving this medicine? Side effects that you should report to your doctor or health care professional as soon as possible:  blood in urine or stools  dry mouth  fever or chills  hearing loss or ringing in the ears  irregular heartbeat  muscle pain or weakness, cramps  rash, fever, and swollen lymph nodes  redness, blistering, peeling or loosening of the skin, including inside the mouth  skin rash  stomach upset, pain, or  nausea  tingling or numbness in the hands or feet  unusually weak or tired  vomiting or diarrhea  yellowing of the eyes or skin Side effects that usually do not require medical attention (report to your doctor or health care professional if they continue or are bothersome):  headache  loss of appetite  unusual bleeding or bruising This list may not describe all possible side effects. Call your doctor for medical advice about side effects. You may report side effects to FDA at 1-800-FDA-1088. Where should I keep my medicine? Keep out of the reach of children and pets. Store at room temperature between 20 and 25 degrees C (68 and 77 degrees F). Protect from light and moisture. Keep the container tightly closed. Throw away any unused drug after the expiration date. NOTE: This sheet is a summary. It may not cover all possible information. If you have questions about this medicine, talk to your doctor, pharmacist, or health care provider.  2020 Elsevier/Gold Standard (2018-11-23 18:01:32)

## 2019-07-05 NOTE — Progress Notes (Signed)
Cardiology Office Note:    Date:  07/05/2019   ID:  [name], DOB 04-25-1945, MRN [id number]  PCP:  Rubie Maid, MD  Cardiologist:  Shirlee More, MD  Electrophysiologist:  None   Referring MD: Rubie Maid, MD   Chief Complaint  Patient presents with  . Shortness of Breath    History of Present Illness:    Demetri Goshert is a 74 y.o. male with a hx of CAD nonSTEMI 10/20/27 with PCI and stent of OM and RCAmild AS/AR, hypertension, hyperlipidemia and LBBB  last seen on October 30,2020 by Dr. Bettina Gavia.  At the time of his visit he was stable from a coronary disease standpoint.  No changes were made in his medication.  The patient requested to be seen today as he has been experiencing significant shortness of breath with exertion.  He tells me since he started giving him these he feels that he has a safety.  He notes that he not karaoke at home and once he starts taking his Felisa Bonier he gets which is new for him.  Patient tells me that he has also been experiencing intermittent chest pain with his shortness of breath.  He notes that it has not been bad enough for him to take because whenever he experiences a dull sensation which is substernal once he rests it improves.  No other complaints at this time.  Past Medical History:  Diagnosis Date  . Aortic stenosis, mild 08/10/2015  . Bilateral leg edema 08/22/2015  . Bradycardia 06/01/2017  . Cellulitis of left lower extremity 04/29/2017  . Coronary artery disease involving native coronary artery 10/22/2017  . DOE (dyspnea on exertion) 05/04/2017  . Essential hypertension 08/13/2015  . Heart murmur 04/29/2017  . History of transient ischemic attack (TIA) 10/30/2017  . Kissing, osteophytes 08/22/2015  . LBBB (left bundle branch block) 08/22/2015  . Lipid disorder 11/30/2017  . Lumbar facet joint pain 08/22/2015  . Medicare annual wellness visit, subsequent 06/02/2016  . Mild aortic regurgitation 08/10/2015  . Mild aortic stenosis 06/01/2017  . Morbid  obesity ([place]) 10/22/2017  . Murmur 04/29/2017  . Need for vaccination for Strep pneumoniae 03/27/2017  . Non-ST elevation myocardial infarction (NSTEMI), subendocardial infarction, subsequent episode of care ([place]) 11/30/2017  . Obesity, Class II, BMI 35-39.9 08/10/2015  . OSA on CPAP 12/02/2017  . Pure hypercholesterolemia 05/24/2015  . Seborrheic keratoses 03/27/2017   Overview:  Of the right forehead.  . Statin intolerance 11/30/2017  . Thrombocytopenia ([place]) 04/29/2017  . TIA (transient ischemic attack) 12/19/2016  . 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 Venous insufficiency 08/10/2015    Past Surgical History:  Procedure Laterality Date  . CARDIAC CATHETERIZATION    . CHOLECYSTECTOMY    . CORONARY ANGIOPLASTY    . ESOPHAGOGASTRODUODENOSCOPY      Current Medications: Current Meds  Medication Sig  . aspirin EC 81 MG tablet Take 1 tablet (81 mg total) by mouth daily.  . clopidogrel (PLAVIX) 75 MG tablet TAKE 1 TABLET BY MOUTH  DAILY  . ezetimibe (ZETIA) 10 MG tablet Take 1 tablet (10 mg total) by mouth daily.  . hydrochlorothiazide (HYDRODIURIL) 25 MG tablet Take 25 mg by mouth daily.   Marland Kitchen losartan (COZAAR) 50 MG tablet TAKE 1 TABLET BY MOUTH  DAILY  . nitroGLYCERIN (NITROSTAT) 0.4 MG SL tablet Place 1 tablet (0.4 mg total) under the tongue every 5 (five) minutes as needed for chest pain.  . pravastatin (PRAVACHOL) 20 MG tablet TAKE 1 TABLET BY MOUTH 2  TIMES PER WEEK     Allergies:   Statins, Isosorbide, and Latex   Social History   Socioeconomic History  . Marital status: Married    Spouse name: Not on file  . Number of children: Not on file  . Years of education: Not on file  . Highest education level: Not on file  Occupational History  . Not on file  Tobacco Use  . Smoking status: Former Smoker    Types: Cigarettes    Quit date: 1973    Years since quitting: 48.2  . Smokeless tobacco: Never Used  Substance and Sexual Activity  . Alcohol use: Yes    Alcohol/week: 7.0 standard  drinks    Types: 7 Glasses of wine per week    Comment: per week  . Drug use: No  . Sexual activity: Not on file  Other Topics Concern  . Not on file  Social History Narrative  . Not on file   Social Determinants of Health   Financial Resource Strain:   . Difficulty of Paying Living Expenses:   Food Insecurity:   . Worried About Charity fundraiser in the Last Year:   . Arboriculturist in the Last Year:   Transportation Needs:   . Film/video editor (Medical):   Marland Kitchen Lack of Transportation (Non-Medical):   Physical Activity:   . Days of Exercise per Week:   . Minutes of Exercise per Session:   Stress:   . Feeling of Stress :   Social Connections:   . Frequency of Communication with Friends and Family:   . Frequency of Social Gatherings with Friends and Family:   . Attends Religious Services:   . Active Member of Clubs or Organizations:   . Attends Archivist Meetings:   Marland Kitchen Marital Status:      Family History: The patient's family history includes Cancer in his mother; Diabetes in his brother and sister; Hypertension in his brother.  ROS:   Review of Systems  Constitution: Negative for decreased appetite, fever and weight gain.  HENT: Negative for congestion, ear discharge, hoarse voice and sore throat.   Eyes: Negative for discharge, redness, vision loss in right eye and visual halos.  Cardiovascular: Reports chest pain and dyspnea on exertion.  Negative for leg swelling, orthopnea and palpitations.  Respiratory: Negative for cough, hemoptysis, shortness of breath and snoring.   Endocrine: Negative for heat intolerance and polyphagia.  Hematologic/Lymphatic: Negative for bleeding problem. Does not bruise/bleed easily.  Skin: Negative for flushing, nail changes, rash and suspicious lesions.  Musculoskeletal: Negative for arthritis, joint pain, muscle cramps, myalgias, neck pain and stiffness.  Gastrointestinal: Negative for abdominal pain, bowel incontinence,  diarrhea and excessive appetite.  Genitourinary: Negative for decreased libido, genital sores and incomplete emptying.  Neurological: Negative for brief paralysis, focal weakness, headaches and loss of balance.  Psychiatric/Behavioral: Negative for altered mental status, depression and suicidal ideas.  Allergic/Immunologic: Negative for HIV exposure and persistent infections.    EKGs/Labs/Other Studies Reviewed:    The following studies were reviewed today:   EKG:  The ekg ordered today demonstrates sinus bradycardia, heart rate 51 bpm, normal deviation with left bundle branch blocks seen compared to EKG done in October 2020.  Cardiac cath 11/01/2018: Diagnostic Summary Severe stenosis of the Marginal Branch, PDA Interventional Summary Successful PCI / 2.25 X 23 Xience Drug Eluting Stent of the mid Posterior Descending Coronary Artery. Successful PCI / 3.25 X 18 Xience Drug Eluting Stent of the  proximal 2nd Obtuse Marginal Coronary Artery. Cardiac Arteries and Lesion Findings LMCA: Lesion on LMCA: 5% stenosis 6 mm length . LAD: Lesion on Prox LAD: 70% stenosis 23 mm length . Lesion on 1st Diag: 60% stenosis 10 mm length . LCx: Lesion on 2nd Ob Marg: Proximal subsection.95% stenosis 16 mm lengthreduced to 0%. Pre procedure TIMI III flow was noted. Post Procedure TIMI III flow was present. Good run off was present. The lesion was diagnosed as Moderate Risk (B). The lesion was heavily calcified.The lesion showed mildangulation.  Echo, 11/11/2018: 1. The left ventricle has normal systolic function, with an ejection fraction of 55-60%. The cavity size was normal. There is moderate concentric left ventricular hypertrophy. Left ventricular diastolic Doppler parameters are consistent with impaired  relaxation. No evidence of left ventricular regional wall motion abnormalities. 2. The right ventricle has normal systolic function. The cavity was normal. There is no increase in  right ventricular wall thickness. 3. The mitral valve is degenerative. There is mild mitral annular calcification present. No evidence of mitral valve stenosis with a calculated valve area of 1.36 cm. 4. The aortic valve is tricuspid. Mild thickening of the aortic valve. Mild calcification of the aortic valve. Aortic valve regurgitation is mild by color flow Doppler. Mild stenosis of the aortic valve. 5. The aorta is normal in size and structure. 6. The aortic root, ascending aorta and aortic arch are normal in size and structure.  MPI, 12/07/2018: Study Highlights  The left ventricular ejection fraction is mildly decreased (45-54%).  Nuclear stress EF: 51%.  There was no ST segment deviation noted during stress.  The study is normal.  This is a low risk study.     Recent Labs: 11/09/2018: ALT 32; BUN 16; Creatinine, Ser 0.91; Potassium 4.5; Sodium 141  Recent Lipid Panel    Component Value Date/Time   CHOL 182 01/14/2019 1030   TRIG 208 (H) 01/14/2019 1030   HDL 36 (L) 01/14/2019 1030   CHOLHDL 5.1 (H) 01/14/2019 1030   LDLCALC 110 (H) 01/14/2019 1030    Physical Exam:    VS:  BP 124/82 (BP Location: Left Arm, Patient Position: Sitting, Cuff Size: Normal)   Pulse (!) 51   Ht _0  (1.753 m)   Wt 258 lb (117 kg)   SpO2 94%   BMI 38.10 kg/m     Wt Readings from Last 3 Encounters:  07/05/19 258 lb (117 kg)  02/18/19 255 lb (115.7 kg)  12/07/18 255 lb (115.7 kg)     GEN: Well nourished, well developed in no acute distress HEENT: Normal NECK: No JVD; No carotid bruits LYMPHATICS: No lymphadenopathy CARDIAC: S1S2 noted,RRR,4/6 mid-to-late ejection systolic murmur radiating to bilateral carotids, rubs, gallops RESPIRATORY:  Clear to auscultation without rales, wheezing or rhonchi  ABDOMEN: Soft, non-tender, non-distended, +bowel sounds, no guarding. EXTREMITIES: bilateral +1 pretibial edema, No cyanosis, no clubbing MUSCULOSKELETAL:  No deformity  SKIN: Warm  and dry NEUROLOGIC:  Alert and oriented x 3, non-focal PSYCHIATRIC:  Normal affect, good insight  ASSESSMENT:    1. Coronary artery disease of native artery of native heart with stable angina pectoris ([place])   2. Shortness of breath   3. Essential hypertension   4. TIA (transient ischemic attack)   5. Mild aortic stenosis   6. OSA on CPAP   7. Bilateral leg edema   8. Obesity, Class II, BMI 35-39.9    PLAN:    Shortness of breath-this could be multifactorial with his clinical exam showing bilateral +  1 leg edema I am going to start the patient on Lasix 20 mg daily for the next 2 weeks.  In addition blood work will be performed today to include BMP, BNP and CBC.  Repeat echocardiogram will be scheduled to assess his mild aortic stenosis for any progression as his physical exam today suggest a mid-to-late ejection systolic murmur.   With his history of coronary artery disease status post PCI feels good experiencing intermittent chest pain which is concerning for angina I am going to start the patient on Ranexa 500 mg twice daily, beta-blocker but he is significantly bradycardic.  Continue his aspirin 81 mg daily, and Plavix 75 mg daily.  His recent stress test which was done in August 2020 showed no evidence of ischemia.  He will follow up in 2 weeks and if he is not getting any relief and continues to have worsening symptoms- will continue pursue a left heart catheterization.   Hyperlipidemia-his established regimen is Zetia and pravastatin will continue this for now.  Repeat lipid profile.  Obesity-the patient understands the need to lose weight with diet and exercise. We have discussed specific strategies for this.  OSA- continue with cpap.  The patient is in agreement with the above plan. The patient left the office in stable condition.  The patient will follow up in 2 weeks with Dr. Bettina Gavia.   Medication Adjustments/Labs and Tests Ordered: Current medicines are reviewed at length with  the patient today.  Concerns regarding medicines are outlined above.  Orders Placed This Encounter  Procedures  . Basic metabolic panel  . Magnesium  . CBC  . Pro b natriuretic peptide (BNP)  . Lipid panel  . EKG 12-Lead  . ECHOCARDIOGRAM COMPLETE   Meds ordered this encounter  Medications  . furosemide (LASIX) 20 MG tablet    Sig: Take 1 tablet (20 mg total) by mouth daily.    Dispense:  14 tablet    Refill:  0  . potassium chloride SA (KLOR-CON) 20 MEQ tablet    Sig: Take 1 tablet (20 mEq total) by mouth daily.    Dispense:  14 tablet    Refill:  0  . ranolazine (RANEXA) 500 MG 12 hr tablet    Sig: Take 1 tablet (500 mg total) by mouth 2 (two) times daily.    Dispense:  60 tablet    Refill:  1    Patient Instructions  Medication Instructions:  Your physician has recommended you make the following change in your medication:    START: Lasix 20 mg daily (for next 2 weeks)  START: Potassium 20 meq daily (for next 2 weeks)  START: Ranexa 500 mg twice daily   *If you need a refill on your cardiac medications before your next appointment, please call your pharmacy*   Lab Work: Your physician recommends that you return for lab work today: bmp, mg, cbc, bnp, lipids   If you have labs (blood work) drawn today and your tests are completely normal, you will receive your results only by: Marland Kitchen MyChart Message (if you have MyChart) OR . A paper copy in the mail If you have any lab test that is abnormal or we need to change your treatment, we will call you to review the results.   Testing/Procedures: Your physician has requested that you have an echocardiogram. Echocardiography is a painless test that uses sound waves to create images of your heart. It provides your doctor with information about the size and shape of your heart  and how well your heart's chambers and valves are working. This procedure takes approximately one hour. There are no restrictions for this  procedure.     Follow-Up: At [facility], you and your health needs are our priority.  As part of our continuing mission to provide you with exceptional heart care, we have created designated Provider Care Teams.  These Care Teams include your primary Cardiologist (physician) and Advanced Practice Providers (APPs -  Physician Assistants and Nurse Practitioners) who all work together to provide you with the care you need, when you need it.  We recommend signing up for the patient portal called "MyChart".  Sign up information is provided on this After Visit Summary.  MyChart is used to connect with patients for Virtual Visits (Telemedicine).  Patients are able to view lab/test results, encounter notes, upcoming appointments, etc.  Non-urgent messages can be sent to your provider as well.   To learn more about what you can do with MyChart, go to [email or web address].    Your next appointment:   2 week(s)  The format for your next appointment:   In Person  Provider:   Berniece Salines, DO   Other Instructions   Echocardiogram An echocardiogram is a procedure that uses painless sound waves (ultrasound) to produce an image of the heart. Images from an echocardiogram can provide important information about:  Signs of coronary artery disease (CAD).  Aneurysm detection. An aneurysm is a weak or damaged part of an artery wall that bulges out from the normal force of blood pumping through the body.  Heart size and shape. Changes in the size or shape of the heart can be associated with certain conditions, including heart failure, aneurysm, and CAD.  Heart muscle function.  Heart valve function.  Signs of a past heart attack.  Fluid buildup around the heart.  Thickening of the heart muscle.  A tumor or infectious growth around the heart valves. Tell a health care provider about:  Any allergies you have.  All medicines you are taking, including vitamins, herbs, eye drops, creams,  and over-the-counter medicines.  Any blood disorders you have.  Any surgeries you have had.  Any medical conditions you have.  Whether you are pregnant or may be pregnant. What are the risks? Generally, this is a safe procedure. However, problems may occur, including:  Allergic reaction to dye (contrast) that may be used during the procedure. What happens before the procedure? No specific preparation is needed. You may eat and drink normally. What happens during the procedure?   An IV tube may be inserted into one of your veins.  You may receive contrast through this tube. A contrast is an injection that improves the quality of the pictures from your heart.  A gel will be applied to your chest.  A wand-like tool (transducer) will be moved over your chest. The gel will help to transmit the sound waves from the transducer.  The sound waves will harmlessly bounce off of your heart to allow the heart images to be captured in real-time motion. The images will be recorded on a computer. The procedure may vary among health care providers and hospitals. What happens after the procedure?  You may return to your normal, everyday life, including diet, activities, and medicines, unless your health care provider tells you not to do that. Summary  An echocardiogram is a procedure that uses painless sound waves (ultrasound) to produce an image of the heart.  Images from an echocardiogram can provide important  information about the size and shape of your heart, heart muscle function, heart valve function, and fluid buildup around your heart.  You do not need to do anything to prepare before this procedure. You may eat and drink normally.  After the echocardiogram is completed, you may return to your normal, everyday life, unless your health care provider tells you not to do that. This information is not intended to replace advice given to you by your  health care provider. Make sure you  discuss any questions you have  with your health care provider. Document Revised: 07/29/2018 Document Reviewed: 05/10/2016 Elsevier Patient Education  [place].  Ranolazine tablets, extended release What is this medicine? RANOLAZINE (ra NOE la zeen) is a heart medicine. It is used to treat chronic chest pain (angina). This medicine must be taken regularly. It will not relieve an acute episode of chest pain. This medicine may be used for other purposes; ask your health care provider or pharmacist if you have questions. COMMON BRAND NAME(S): Ranexa What should I tell my health care provider before I take this medicine? They need to know if you have any of these conditions:  heart disease  irregular heartbeat  kidney disease  liver disease  low levels of potassium or magnesium in the blood  an unusual or allergic reaction to ranolazine, other medicines, foods, dyes, or preservatives  pregnant or trying to get pregnant  breast-feeding How should I use this medicine? Take this medicine by mouth with a glass of water. Follow the directions on the prescription label. Do not cut, crush, or chew this medicine. Take with or without food. Do not take this medication with grapefruit juice. Take your doses at regular intervals. Do not take your medicine more often then directed. Talk to your pediatrician regarding the use of this medicine in children. Special care may be needed. Overdosage: If you think you have taken too much of this medicine contact a poison control center or emergency room at once. NOTE: This medicine is only for you. Do not share this medicine with others. What if I miss a dose? If you miss a dose, take it as soon as you can. If it is almost time for your next dose, take only that dose. Do not take double or extra doses. What may interact with this medicine? Do not take this medicine with any of the following medications:  antivirals for HIV or  AIDS  cerivastatin  certain antibiotics like chloramphenicol, clarithromycin, dalfopristin; quinupristin, isoniazid, rifabutin, rifampin, rifapentine  certain medicines used for cancer like imatinib, nilotinib  certain medicines for fungal infections like fluconazole, itraconazole, ketoconazole, posaconazole, voriconazole  certain medicines for irregular heart beat like dronedarone  certain medicines for seizures like carbamazepine, fosphenytoin, oxcarbazepine, phenobarbital, phenytoin  cisapride  conivaptan  cyclosporine  grapefruit or grapefruit juice  lumacaftor; ivacaftor  nefazodone  pimozide  quinacrine  St John's wort  thioridazine This medicine may also interact with the following medications:  alfuzosin  certain medicines for depression, anxiety, or psychotic disturbances like bupropion, citalopram, fluoxetine, fluphenazine, paroxetine, perphenazine, risperidone, sertraline, trifluoperazine  certain medicines for cholesterol like atorvastatin, lovastatin, simvastatin  certain medicines for stomach problems like octreotide, palonosetron, prochlorperazine  eplerenone  ergot alkaloids like dihydroergotamine, ergonovine, ergotamine, methylergonovine  metformin  nicardipine  other medicines that prolong the QT interval (cause an abnormal heart rhythm) like dofetilide, ziprasidone  sirolimus  tacrolimus This list may not describe all possible interactions. Give your health care provider a list of all  the medicines, herbs, non-prescription drugs, or dietary supplements you use. Also tell them if you smoke, drink alcohol, or use illegal drugs. Some items may interact with your medicine. What should I watch for while using this medicine? Visit your doctor for regular check ups. Tell your doctor or healthcare professional if your symptoms do not start to get better or if they get worse. This medicine will not relieve an acute attack of angina or chest  pain. This medicine can change your heart rhythm. Your health care provider may check your heart rhythm by ordering an electrocardiogram (ECG) while you are taking this medicine. You may get drowsy or dizzy. Do not drive, use machinery, or do anything that needs mental alertness until you know how this medicine affects you. Do not stand or sit up quickly, especially if you are an older patient. This reduces the risk of dizzy or fainting spells. Alcohol may interfere with the effect of this medicine. Avoid alcoholic drinks. If you are scheduled for any medical or dental procedure, tell your healthcare provider that you are taking this medicine. This medicine can interact with other medicines used during surgery. What side effects may I notice from receiving this medicine? Side effects that you should report to your doctor or health care professional as soon as possible:  allergic reactions like skin rash, itching or hives, swelling of the face, lips, or tongue  breathing problems  changes in vision  fast, irregular or pounding heartbeat  feeling faint or lightheaded, falls  low or high blood pressure  numbness or tingling feelings  ringing in the ears  tremor or shakiness  slow heartbeat (fewer than 50 beats per minute)  swelling of the legs or feet Side effects that usually do not require medical attention (report to your doctor or health care professional if they continue or are bothersome):  constipation  drowsy  dry mouth  headache  nausea or vomiting  stomach upset This list may not describe all possible side effects. Call your doctor for medical advice about side effects. You may report side effects to FDA at 1-800-FDA-1088. Where should I keep my medicine? Keep out of the reach of children. Store at room temperature between 15 and 30 degrees C (59 and 86 degrees F). Throw away any unused medicine after the expiration date. NOTE: This sheet is a summary. It may not  cover all possible information. If you have questions about this medicine, talk to your doctor, pharmacist, or health care provider.  2020 Elsevier/Gold Standard (2018-03-30 09:18:49)  Furosemide Oral Tablets What is this medicine? FUROSEMIDE (fyoor [place] se mide) is a diuretic. It helps you make more urine and to lose salt and excess water from your body. It treats swelling from heart, kidney, or liver disease. It also treats high blood pressure. This medicine may be used for other purposes; ask your health care provider or pharmacist if you have questions. COMMON BRAND NAME(S): Active-Medicated Specimen Kit, Delone, Diuscreen, Lasix, RX Specimen Collection Kit, Specimen Collection Kit, URINX Medicated Specimen Collection What should I tell my health care provider before I take this medicine? They need to know if you have any of these conditions:  abnormal blood electrolytes  diarrhea or vomiting  gout  heart disease  kidney disease, small amounts of urine, or difficulty passing urine  liver disease  thyroid disease  an unusual or allergic reaction to furosemide, sulfa drugs, other medicines, foods, dyes, or preservatives  pregnant or trying to get pregnant  breast-feeding How  should I use this medicine? Take this drug by mouth. Take it as directed on the prescription label at the same time every day. You can take it with or without food. If it upsets your stomach, take it with food. Keep taking it unless your health care provider tells you to stop. Talk to your health care provider about the use of this drug in children. Special care may be needed. Overdosage: If you think you have taken too much of this medicine contact a poison control center or emergency room at once. NOTE: This medicine is only for you. Do not share this medicine with others. What if I miss a dose? If you miss a dose, take it as soon as you can. If it is almost time for your next dose, take only that dose. Do  not take double or extra doses. What may interact with this medicine?  aspirin and aspirin-like medicines  certain antibiotics  chloral hydrate  cisplatin  cyclosporine  digoxin  diuretics  laxatives  lithium  medicines for blood pressure  medicines that relax muscles for surgery  methotrexate  NSAIDs, medicines for pain and inflammation like ibuprofen, naproxen, or indomethacin  phenytoin  steroid medicines like prednisone or cortisone  sucralfate  thyroid hormones This list may not describe all possible interactions. Give your health care provider a list of all the medicines, herbs, non-prescription drugs, or dietary supplements you use. Also tell them if you smoke, drink alcohol, or use illegal drugs. Some items may interact with your medicine. What should I watch for while using this medicine? Visit your doctor or health care provider for regular checks on your progress. Check your blood pressure regularly. Ask your doctor or health care provider what your blood pressure should be, and when you should contact him or her. If you are a diabetic, check your blood sugar as directed. This medicine may cause serious skin reactions. They can happen weeks to months after starting the medicine. Contact your health care provider right away if you notice fevers or flu-like symptoms with a rash. The rash may be red or purple and then turn into blisters or peeling of the skin. Or, you might notice a red rash with swelling of the face, lips or lymph nodes in your neck or under your arms. You may need to be on a special diet while taking this medicine. Check with your doctor. Also, ask how many glasses of fluid you need to drink a day. You must not get dehydrated. You may get drowsy or dizzy. Do not drive, use machinery, or do anything that needs mental alertness until you know how this drug affects you. Do not stand or sit up quickly, especially if you are an older patient. This  reduces the risk of dizzy or fainting spells. Alcohol can make you more drowsy and dizzy. Avoid alcoholic drinks. This medicine can make you more sensitive to the sun. Keep out of the sun. If you cannot avoid being in the sun, wear protective clothing and use sunscreen. Do not use sun lamps or tanning beds/booths. What side effects may I notice from receiving this medicine? Side effects that you should report to your doctor or health care professional as soon as possible:  blood in urine or stools  dry mouth  fever or chills  hearing loss or ringing in the ears  irregular heartbeat  muscle pain or weakness, cramps  rash, fever, and swollen lymph nodes  redness, blistering, peeling or loosening of the  skin, including inside the mouth  skin rash  stomach upset, pain, or nausea  tingling or numbness in the hands or feet  unusually weak or tired  vomiting or diarrhea  yellowing of the eyes or skin Side effects that usually do not require medical attention (report to your doctor or health care professional if they continue or are bothersome):  headache  loss of appetite  unusual bleeding or bruising This list may not describe all possible side effects. Call your doctor for medical advice about side effects. You may report side effects to FDA at 1-800-FDA-1088. Where should I keep my medicine? Keep out of the reach of children and pets. Store at room temperature between 20 and 25 degrees C (68 and 77 degrees F). Protect from light and moisture. Keep the container tightly closed. Throw away any unused drug after the expiration date. NOTE: This sheet is a summary. It may not cover all possible information. If you have questions about this medicine, talk to your doctor, pharmacist, or health care provider.  2020 [employer]/Gold Standard (2018-11-23 18:01:32)      Adopting a Healthy Lifestyle.  Know what a healthy weight is for you (roughly BMI <25) and aim to maintain this    Aim for 7+ servings of fruits and vegetables daily   65-80+ fluid ounces of water or unsweet tea for healthy kidneys   Limit to max 1 drink of alcohol per day; avoid smoking/tobacco   Limit animal fats in diet for cholesterol and heart health - choose grass fed whenever available   Avoid highly processed foods, and foods high in saturated/trans fats   Aim for low stress - take time to unwind and care for your mental health   Aim for 150 min of moderate intensity exercise weekly for heart health, and weights twice weekly for bone health   Aim for 7-9 hours of sleep daily   When it comes to diets, agreement about the perfect plan isnt easy to find, even among the experts. Experts at the [employer] developed an idea known as the Healthy Eating Plate. Just imagine a plate divided into logical, healthy portions.   The emphasis is on diet quality:   Load up on vegetables and fruits - one-half of your plate: Aim for color and variety, and remember that potatoes dont count.   Go for whole grains - one-quarter of your plate: Whole wheat, barley, wheat berries, quinoa, oats, brown rice, and foods made with them. If you want pasta, go with whole wheat pasta.   Protein power - one-quarter of your plate: Fish, chicken, beans, and nuts are all healthy, versatile protein sources. Limit red meat.   The diet, however, does go beyond the plate, offering a few other suggestions.   Use healthy plant oils, such as olive, canola, soy, corn, sunflower and peanut. Check the labels, and avoid partially hydrogenated oil, which have unhealthy trans fats.   If youre thirsty, drink water. Coffee and tea are good in moderation, but skip sugary drinks and limit milk and dairy products to one or two daily servings.   The type of carbohydrate in the diet is more important than the amount. Some sources of carbohydrates, such as vegetables, fruits, whole grains, and beans-are healthier than  others.   Finally, stay active  Signed, Berniece Salines, DO  07/05/2019 9:29 AM    [facility]

## 2019-07-06 LAB — MAGNESIUM: Magnesium: 2 mg/dL (ref 1.6–2.3)

## 2019-07-06 LAB — LIPID PANEL
Chol/HDL Ratio: 4.4 ratio (ref 0.0–5.0)
Cholesterol, Total: 189 mg/dL (ref 100–199)
HDL: 43 mg/dL (ref >39)
LDL Chol Calc (NIH): 118 mg/dL — ABNORMAL HIGH (ref 0–99)
Triglycerides: 156 mg/dL — ABNORMAL HIGH (ref 0–149)
VLDL Cholesterol Cal: 28 mg/dL (ref 5–40)

## 2019-07-06 LAB — CBC
Hematocrit: 51 % (ref 37.5–51.0)
Hemoglobin: 17.6 g/dL (ref 13.0–17.7)
MCH: 31.2 pg (ref 26.6–33.0)
MCHC: 34.5 g/dL (ref 31.5–35.7)
MCV: 90 fL (ref 79–97)
Platelets: 150 10*3/uL (ref 150–450)
RBC: 5.65 x10E6/uL (ref 4.14–5.80)
RDW: 14.1 % (ref 11.6–15.4)
WBC: 6.6 10*3/uL (ref 3.4–10.8)

## 2019-07-06 LAB — BASIC METABOLIC PANEL
BUN/Creatinine Ratio: 15 (ref 10–24)
BUN: 13 mg/dL (ref 8–27)
CO2: 26 mmol/L (ref 20–29)
Calcium: 9.7 mg/dL (ref 8.6–10.2)
Chloride: 99 mmol/L (ref 96–106)
Creatinine, Ser: 0.88 mg/dL (ref 0.76–1.27)
GFR calc Af Amer: 97 mL/min/{1.73_m2} (ref >59)
GFR calc non Af Amer: 84 mL/min/{1.73_m2} (ref >59)
Glucose: 91 mg/dL (ref 65–99)
Potassium: 3.9 mmol/L (ref 3.5–5.2)
Sodium: 140 mmol/L (ref 134–144)

## 2019-07-06 LAB — PRO B NATRIURETIC PEPTIDE: NT-Pro BNP: 128 pg/mL (ref 0–486)

## 2019-07-25 ENCOUNTER — Ambulatory Visit: Payer: Medicare Other | Admitting: Cardiology

## 2019-07-25 ENCOUNTER — Other Ambulatory Visit: Payer: Self-pay

## 2019-07-25 ENCOUNTER — Encounter: Payer: Self-pay | Admitting: Cardiology

## 2019-07-25 VITALS — BP 136/84 | HR 56 | Ht 69.0 in | Wt 255.0 lb

## 2019-07-25 DIAGNOSIS — I25118 Atherosclerotic heart disease of native coronary artery with other forms of angina pectoris: Secondary | ICD-10-CM | POA: Diagnosis not present

## 2019-07-25 DIAGNOSIS — I200 Unstable angina: Secondary | ICD-10-CM | POA: Diagnosis not present

## 2019-07-25 DIAGNOSIS — G459 Transient cerebral ischemic attack, unspecified: Secondary | ICD-10-CM | POA: Diagnosis not present

## 2019-07-25 DIAGNOSIS — I10 Essential (primary) hypertension: Secondary | ICD-10-CM | POA: Diagnosis not present

## 2019-07-25 DIAGNOSIS — E669 Obesity, unspecified: Secondary | ICD-10-CM

## 2019-07-25 NOTE — Addendum Note (Signed)
Addended by: Thomasene Ripple on: 07/25/2019 09:48 PM   Modules accepted: Orders, SmartSet

## 2019-07-25 NOTE — Progress Notes (Signed)
Cardiology Office Note:    Date:  07/25/2019   ID:  Craig Lawson, DOB 07/07/1945, MRN [id number]  PCP:  Escajeda, Richard, MD  Cardiologist:  Brian Munley, MD  Electrophysiologist:  None   Referring MD: Escajeda, Richard, MD   Chief Complaint  Patient presents with  . Follow-up    History of Present Illness:     Craig Lawsonis a 74 y.o.malewith a hx of CAD nonSTEMI 10/20/27 with PCI and stent of OM and RCAmild AS/AR, hypertension, hyperlipidemia and LBBB. I did see the patient 1 July 05, 2019 at that time he reported that he has been experiencing significant shortness of breath on exertion.  He noted that he did experience some intermittent chest pain with this.  He described it as a dull sensation of left-sided chest pain which started every time he exerted himself and then resolves with rest. At that visit, I started the patient on antianginal with renaxa, due to bilateral leg edema started lasix with potassium supplement and ask him to follow up in 2 weeks. A TTE was also ordered to assess his Aortic stenosis.   Today he reports that he has been taking his medication as prescribed and feels that he is getting worse. He stated " it feels similar to when I get my last stent. I am worried as I do not want to end up like the last time when I started to experienced symptoms sitting". No other complaints at this time.    Past Medical History:  Diagnosis Date  . Aortic stenosis, mild 08/10/2015  . Bilateral leg edema 08/22/2015  . Bradycardia 06/01/2017  . Cellulitis of left lower extremity 04/29/2017  . Coronary artery disease involving native coronary artery 10/22/2017  . DOE (dyspnea on exertion) 05/04/2017  . Essential hypertension 08/13/2015  . Heart murmur 04/29/2017  . History of transient ischemic attack (TIA) 10/30/2017  . Kissing, osteophytes 08/22/2015  . LBBB (left bundle branch block) 08/22/2015  . Lipid disorder 11/30/2017  . Lumbar facet joint pain 08/22/2015  . Medicare annual  wellness visit, subsequent 06/02/2016  . Mild aortic regurgitation 08/10/2015  . Mild aortic stenosis 06/01/2017  . Morbid obesity (HCC) 10/22/2017  . Murmur 04/29/2017  . Need for vaccination for Strep pneumoniae 03/27/2017  . Non-ST elevation myocardial infarction (NSTEMI), subendocardial infarction, subsequent episode of care (HCC) 11/30/2017  . Obesity, Class II, BMI 35-39.9 08/10/2015  . OSA on CPAP 12/02/2017  . Pure hypercholesterolemia 05/24/2015  . Seborrheic keratoses 03/27/2017   Overview:  Of the right forehead.  . Statin intolerance 11/30/2017  . Thrombocytopenia (HCC) 04/29/2017  . TIA (transient ischemic attack) 12/19/2016  . 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 Venous insufficiency 08/10/2015    Past Surgical History:  Procedure Laterality Date  . CARDIAC CATHETERIZATION    . CHOLECYSTECTOMY    . CORONARY ANGIOPLASTY    . ESOPHAGOGASTRODUODENOSCOPY      Current Medications: Current Meds  Medication Sig  . aspirin EC 81 MG tablet Take 1 tablet (81 mg total) by mouth daily.  . clopidogrel (PLAVIX) 75 MG tablet TAKE 1 TABLET BY MOUTH  DAILY  . ezetimibe (ZETIA) 10 MG tablet Take 1 tablet (10 mg total) by mouth daily.  . furosemide (LASIX) 20 MG tablet Take 1 tablet (20 mg total) by mouth daily.  . hydrochlorothiazide (HYDRODIURIL) 25 MG tablet Take 25 mg by mouth daily.   . losartan (COZAAR) 50 MG tablet TAKE 1 TABLET BY MOUTH  DAILY  . nitroGLYCERIN (NITROSTAT) 0.4 MG   SL tablet Place 1 tablet (0.4 mg total) under the tongue every 5 (five) minutes as needed for chest pain.  . potassium chloride SA (KLOR-CON) 20 MEQ tablet Take 1 tablet (20 mEq total) by mouth daily.  . pravastatin (PRAVACHOL) 20 MG tablet TAKE 1 TABLET BY MOUTH 2  TIMES PER WEEK  . ranolazine (RANEXA) 500 MG 12 hr tablet Take 1 tablet (500 mg total) by mouth 2 (two) times daily.     Allergies:   Statins, Isosorbide, and Latex   Social History   Socioeconomic History  . Marital status: Married    Spouse name:  Not on file  . Number of children: Not on file  . Years of education: Not on file  . Highest education level: Not on file  Occupational History  . Not on file  Tobacco Use  . Smoking status: Former Smoker    Types: Cigarettes    Quit date: 1973    Years since quitting: 48.2  . Smokeless tobacco: Never Used  Substance and Sexual Activity  . Alcohol use: Yes    Alcohol/week: 7.0 standard drinks    Types: 7 Glasses of wine per week    Comment: per week  . Drug use: No  . Sexual activity: Not on file  Other Topics Concern  . Not on file  Social History Narrative  . Not on file   Social Determinants of Health   Financial Resource Strain:   . Difficulty of Paying Living Expenses:   Food Insecurity:   . Worried About Programme researcher, broadcasting/film/video in the Last Year:   . Barista in the Last Year:   Transportation Needs:   . Freight forwarder (Medical):   Marland Kitchen Lack of Transportation (Non-Medical):   Physical Activity:   . Days of Exercise per Week:   . Minutes of Exercise per Session:   Stress:   . Feeling of Stress :   Social Connections:   . Frequency of Communication with Friends and Family:   . Frequency of Social Gatherings with Friends and Family:   . Attends Religious Services:   . Active Member of Clubs or Organizations:   . Attends Banker Meetings:   Marland Kitchen Marital Status:      Family History: The patient's family history includes Cancer in his mother; Diabetes in his brother and sister; Hypertension in his brother.  ROS:   Review of Systems  Constitution: Negative for decreased appetite, fever and weight gain.  HENT: Negative for congestion, ear discharge, hoarse voice and sore throat.   Eyes: Negative for discharge, redness, vision loss in right eye and visual halos.  Cardiovascular: Reports chest pain, dyspnea on exertion.  Negative for, leg swelling, orthopnea and palpitations.  Respiratory: Negative for cough, hemoptysis, shortness of breath and  snoring.   Endocrine: Negative for heat intolerance and polyphagia.  Hematologic/Lymphatic: Negative for bleeding problem. Does not bruise/bleed easily.  Skin: Negative for flushing, nail changes, rash and suspicious lesions.  Musculoskeletal: Negative for arthritis, joint pain, muscle cramps, myalgias, neck pain and stiffness.  Gastrointestinal: Negative for abdominal pain, bowel incontinence, diarrhea and excessive appetite.  Genitourinary: Negative for decreased libido, genital sores and incomplete emptying.  Neurological: Negative for brief paralysis, focal weakness, headaches and loss of balance.  Psychiatric/Behavioral: Negative for altered mental status, depression and suicidal ideas.  Allergic/Immunologic: Negative for HIV exposure and persistent infections.    EKGs/Labs/Other Studies Reviewed:    The following studies were reviewed today:  EKG:  None today  Cardiac cath 11/01/2018: Diagnostic Summary Severe stenosis of the Marginal Branch, PDA Interventional Summary Successful PCI / 2.25 X 23 Xience Drug Eluting Stent of the mid Posterior Descending Coronary Artery. Successful PCI / 3.25 X 18 Xience Drug Eluting Stent of the proximal 2nd Obtuse Marginal Coronary Artery. Cardiac Arteries and Lesion Findings LMCA: Lesion on LMCA: 5% stenosis 6 mm length . LAD: Lesion on Prox LAD: 70% stenosis 23 mm length . Lesion on 1st Diag: 60% stenosis 10 mm length . LCx: Lesion on 2nd Ob Marg: Proximal subsection.95% stenosis 16 mm lengthreduced to 0%. Pre procedure TIMI III flow was noted. Post Procedure TIMI III flow was present. Good run off was present. The lesion was diagnosed as Moderate Risk (B). The lesion was heavily calcified.The lesion showed mildangulation.  Echo, 11/11/2018: 1. The left ventricle has normal systolic function, with an ejection fraction of 55-60%. The cavity size was normal. There is moderate concentric left ventricular hypertrophy. Left  ventricular diastolic Doppler parameters are consistent with impaired  relaxation. No evidence of left ventricular regional wall motion abnormalities. 2. The right ventricle has normal systolic function. The cavity was normal. There is no increase in right ventricular wall thickness. 3. The mitral valve is degenerative. There is mild mitral annular calcification present. No evidence of mitral valve stenosiswith a calculated valve area of 1.36 cm. 4. The aortic valve is tricuspid. Mild thickening of the aortic valve. Mild calcification of the aortic valve. Aortic valve regurgitation is mild by color flow Doppler. Mild stenosis of the aortic valve. 5. The aorta is normal in size and structure. 6. The aortic root, ascending aorta and aortic arch are normal in size and structure.  MPI, 12/07/2018: Study Highlights  The left ventricular ejection fraction is mildly decreased (45-54%).  Nuclear stress EF: 51%.  There was no ST segment deviation noted during stress.  The study is normal.  This is a low risk study.     Recent Labs: 11/09/2018: ALT 32 07/05/2019: BUN 13; Creatinine, Ser 0.88; Hemoglobin 17.6; Magnesium 2.0; NT-Pro BNP 128; Platelets 150; Potassium 3.9; Sodium 140  Recent Lipid Panel    Component Value Date/Time   CHOL 189 07/05/2019 0853   TRIG 156 (H) 07/05/2019 0853   HDL 43 07/05/2019 0853   CHOLHDL 4.4 07/05/2019 0853   LDLCALC 118 (H) 07/05/2019 0853    Physical Exam:    VS:  BP 136/84 (BP Location: Right Arm, Patient Position: Sitting, Cuff Size: Normal)   Pulse (!) 56   Ht 5\' 9"  (1.753 m)   Wt 255 lb (115.7 kg)   SpO2 94%   BMI 37.66 kg/m     Wt Readings from Last 3 Encounters:  07/25/19 255 lb (115.7 kg)  07/05/19 258 lb (117 kg)  02/18/19 255 lb (115.7 kg)     GEN: Well nourished, well developed in no acute distress HEENT: Normal NECK: No JVD; No carotid bruits LYMPHATICS: No lymphadenopathy CARDIAC: S1S2 noted,RRR, no murmurs, rubs,  gallops RESPIRATORY:  Clear to auscultation without rales, wheezing or rhonchi  ABDOMEN: Soft, non-tender, non-distended, +bowel sounds, no guarding. EXTREMITIES: No edema, No cyanosis, no clubbing MUSCULOSKELETAL:  No deformity  SKIN: Warm and dry NEUROLOGIC:  Alert and oriented x 3, non-focal PSYCHIATRIC:  Normal affect, good insight  ASSESSMENT:    1. Coronary artery disease of native artery of native heart with stable angina pectoris ([facility])   2. Unstable angina ([facility])   3. Essential hypertension   4. TIA (transient  ischemic attack)   5. Obesity, Class II, BMI 35-39.9    PLAN:     1. He is not responding to the antianginal and getting worse. I would like to assess for progression of CAD.  At this time I want to pursue an left heart catheterization.  The patient understands that risks include but are not limited to stroke (1 in 1000), death (1 in 1000), kidney failure [usually temporary] (1 in 500), bleeding (1 in 200), allergic reaction [possibly serious] (1 in 200), and agrees to proceed.  2. Hypertension - I will continue his current antihypertensive regimen.   3. Hyperlipidemia - continue current statin dose with zetia.  4. Obesity - continue lifestyle modification - limited with exercise due to shortness of breath and chest pain.   5. TIA - continue with aspirin and statin.   The patient is in agreement with the above plan. The patient left the office in stable condition.  The patient will follow up in 2 weeks.   Medication Adjustments/Labs and Tests Ordered: Current medicines are reviewed at length with the patient today.  Concerns regarding medicines are outlined above.  Orders Placed This Encounter  Procedures  . Basic metabolic panel  . CBC  . ECHOCARDIOGRAM COMPLETE   No orders of the defined types were placed in this encounter.   Patient Instructions  Medication Instructions:  Your physician recommends that you continue on your current medications as  directed. Please refer to the Current Medication list given to you today.  *If you need a refill on your cardiac medications before your next appointment, please call your pharmacy*   Lab Work: TODAY:  BMET & CBC  If you have labs (blood work) drawn today and your tests are completely normal, you will receive your results only by: Marland Kitchen MyChart Message (if you have MyChart) OR . A paper copy in the mail If you have any lab test that is abnormal or we need to change your treatment, we will call you to review the results.   Testing/Procedures: Your physician has requested that you have a cardiac catheterization. Cardiac catheterization is used to diagnose and/or treat various heart conditions. Doctors may recommend this procedure for a number of different reasons. The most common reason is to evaluate chest pain. Chest pain can be a symptom of coronary artery disease (CAD), and cardiac catheterization can show whether plaque is narrowing or blocking your heart's arteries. This procedure is also used to evaluate the valves, as well as measure the blood flow and oxygen levels in different parts of your heart. For further information please visit [email or web address] Please follow instruction sheet, BELOW.     [facility] AT [place] [address] [place] [address] Dept: [phone] Loc: [phone]  Roxie Gueye  07/25/2019  You are scheduled for a Cardiac Catheterization on Thursday, April 8 with Dr. Lance Muss.  1. Please arrive at the [facility] (Main Entrance A) at [facility]: [address] [place], [place] [address] at 5:30 AM (This time is two hours before your procedure to ensure your preparation). Free valet parking service is available.   Special note: Every effort is made to have your procedure done on time. Please understand that emergencies sometimes delay scheduled procedures.  2. Diet: Do  not eat solid foods after midnight.  The patient may have clear liquids until 5am upon the day of the procedure.  3. Labs: You will need to have blood  drawn on TODAY  4. Medication instructions in preparation for your procedure:   Contrast Allergy: No  GO TO [facility] ON TOMORROW, 07/26/2019 FOR COVID TESTING AT 11:45.  ONCE YOU HAVE BEEN SWABBED, YOU NEED TO RETURN HOME AND QUARANTINE UNTIL AFTER YOUR PROCEDURE.  Stop taking, Lasix (Furosemide)  and HTCZ (Hydrochlorothiazide) Thursday, April 8,    On the morning of your procedure, take your Plavix/Clopidogrel and any morning medicines NOT listed above.  You may use sips of water.  5. Plan for one night stay--bring personal belongings. 6. Bring a current list of your medications and current insurance cards. 7. You MUST have a responsible person to drive you home. 8. Someone MUST be with you the first 24 hours after you arrive home or your discharge will be delayed. 9. Please wear clothes that are easy to get on and off and wear slip-on shoes.  Thank you for allowing us to care for you!   -- [facility]     Follow-Up: At [facility], you and your health needs are our priority.  As part of our continuing mission to provide you with exceptional heart care, we have created designated Provider Care Teams.  These Care Teams include your primary Cardiologist (physician) and Advanced Practice Providers (APPs -  Physician Assistants and Nurse Practitioners) who all work together to provide you with the care you need, when you need it.  We recommend signing up for the patient portal called "MyChart".  Sign up information is provided on this After Visit Summary.  MyChart is used to connect with patients for Virtual Visits (Telemedicine).  Patients are able to view lab/test results, encounter notes, upcoming appointments, etc.  Non-urgent messages can be sent to your provider as well.   To learn more  about what you can do with MyChart, go to [email or web address].    Your next appointment:   2 week(s)  08/12/19 AT 2:15  The format for your next appointment:   In Person  Provider:   Thomasene RippleKardie Odilia Damico, DO   Other Instructions     Adopting a Healthy Lifestyle.  Know what a healthy weight is for you (roughly BMI <25) and aim to maintain this   Aim for 7+ servings of fruits and vegetables daily   65-80+ fluid ounces of water or unsweet tea for healthy kidneys   Limit to max 1 drink of alcohol per day; avoid smoking/tobacco   Limit animal fats in diet for cholesterol and heart health - choose grass fed whenever available   Avoid highly processed foods, and foods high in saturated/trans fats   Aim for low stress - take time to unwind and care for your mental health   Aim for 150 min of moderate intensity exercise weekly for heart health, and weights twice weekly for bone health   Aim for 7-9 hours of sleep daily   When it comes to diets, agreement about the perfect plan isnt easy to find, even among the experts. Experts at the [employer] developed an idea known as the Healthy Eating Plate. Just imagine a plate divided into logical, healthy portions.   The emphasis is on diet quality:   Load up on vegetables and fruits - one-half of your plate: Aim for color and variety, and remember that potatoes dont count.   Go for whole grains - one-quarter of your plate: Whole wheat, barley, wheat berries, quinoa, oats, brown rice, and foods made with them. If you  want pasta, go with whole wheat pasta.   Protein power - one-quarter of your plate: Fish, chicken, beans, and nuts are all healthy, versatile protein sources. Limit red meat.   The diet, however, does go beyond the plate, offering a few other suggestions.   Use healthy plant oils, such as olive, canola, soy, corn, sunflower and peanut. Check the labels, and avoid partially hydrogenated oil, which have  unhealthy trans fats.   If youre thirsty, drink water. Coffee and tea are good in moderation, but skip sugary drinks and limit milk and dairy products to one or two daily servings.   The type of carbohydrate in the diet is more important than the amount. Some sources of carbohydrates, such as vegetables, fruits, whole grains, and beans-are healthier than others.   Finally, stay active  Signed, Thomasene Ripple, DO  07/25/2019 9:44 PM    [facility]

## 2019-07-25 NOTE — H&P (View-Only) (Signed)
Cardiology Office Note:    Date:  07/25/2019   ID:  Craig Lawson, DOB 07/10/1945, MRN [id number]  PCP:  Escajeda, Richard, MD  Cardiologist:  Brian Munley, MD  Electrophysiologist:  None   Referring MD: Escajeda, Richard, MD   Chief Complaint  Patient presents with  . Follow-up    History of Present Illness:     Craig Lawsonis a 74 y.o.malewith a hx of CAD nonSTEMI 10/20/27 with PCI and stent of OM and RCAmild AS/AR, hypertension, hyperlipidemia and LBBB. I did see the patient 1 July 05, 2019 at that time he reported that he has been experiencing significant shortness of breath on exertion.  He noted that he did experience some intermittent chest pain with this.  He described it as a dull sensation of left-sided chest pain which started every time he exerted himself and then resolves with rest. At that visit, I started the patient on antianginal with renaxa, due to bilateral leg edema started lasix with potassium supplement and ask him to follow up in 2 weeks. A TTE was also ordered to assess his Aortic stenosis.   Today he reports that he has been taking his medication as prescribed and feels that he is getting worse. He stated " it feels similar to when I get my last stent. I am worried as I do not want to end up like the last time when I started to experienced symptoms sitting". No other complaints at this time.    Past Medical History:  Diagnosis Date  . Aortic stenosis, mild 08/10/2015  . Bilateral leg edema 08/22/2015  . Bradycardia 06/01/2017  . Cellulitis of left lower extremity 04/29/2017  . Coronary artery disease involving native coronary artery 10/22/2017  . DOE (dyspnea on exertion) 05/04/2017  . Essential hypertension 08/13/2015  . Heart murmur 04/29/2017  . History of transient ischemic attack (TIA) 10/30/2017  . Kissing, osteophytes 08/22/2015  . LBBB (left bundle branch block) 08/22/2015  . Lipid disorder 11/30/2017  . Lumbar facet joint pain 08/22/2015  . Medicare annual  wellness visit, subsequent 06/02/2016  . Mild aortic regurgitation 08/10/2015  . Mild aortic stenosis 06/01/2017  . Morbid obesity (HCC) 10/22/2017  . Murmur 04/29/2017  . Need for vaccination for Strep pneumoniae 03/27/2017  . Non-ST elevation myocardial infarction (NSTEMI), subendocardial infarction, subsequent episode of care (HCC) 11/30/2017  . Obesity, Class II, BMI 35-39.9 08/10/2015  . OSA on CPAP 12/02/2017  . Pure hypercholesterolemia 05/24/2015  . Seborrheic keratoses 03/27/2017   Overview:  Of the right forehead.  . Statin intolerance 11/30/2017  . Thrombocytopenia (HCC) 04/29/2017  . TIA (transient ischemic attack) 12/19/2016  . 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 Venous insufficiency 08/10/2015    Past Surgical History:  Procedure Laterality Date  . CARDIAC CATHETERIZATION    . CHOLECYSTECTOMY    . CORONARY ANGIOPLASTY    . ESOPHAGOGASTRODUODENOSCOPY      Current Medications: Current Meds  Medication Sig  . aspirin EC 81 MG tablet Take 1 tablet (81 mg total) by mouth daily.  . clopidogrel (PLAVIX) 75 MG tablet TAKE 1 TABLET BY MOUTH  DAILY  . ezetimibe (ZETIA) 10 MG tablet Take 1 tablet (10 mg total) by mouth daily.  . furosemide (LASIX) 20 MG tablet Take 1 tablet (20 mg total) by mouth daily.  . hydrochlorothiazide (HYDRODIURIL) 25 MG tablet Take 25 mg by mouth daily.   . losartan (COZAAR) 50 MG tablet TAKE 1 TABLET BY MOUTH  DAILY  . nitroGLYCERIN (NITROSTAT) 0.4 MG   SL tablet Place 1 tablet (0.4 mg total) under the tongue every 5 (five) minutes as needed for chest pain.  . potassium chloride SA (KLOR-CON) 20 MEQ tablet Take 1 tablet (20 mEq total) by mouth daily.  . pravastatin (PRAVACHOL) 20 MG tablet TAKE 1 TABLET BY MOUTH 2  TIMES PER WEEK  . ranolazine (RANEXA) 500 MG 12 hr tablet Take 1 tablet (500 mg total) by mouth 2 (two) times daily.     Allergies:   Statins, Isosorbide, and Latex   Social History   Socioeconomic History  . Marital status: Married    Spouse name:  Not on file  . Number of children: Not on file  . Years of education: Not on file  . Highest education level: Not on file  Occupational History  . Not on file  Tobacco Use  . Smoking status: Former Smoker    Types: Cigarettes    Quit date: 1973    Years since quitting: 48.2  . Smokeless tobacco: Never Used  Substance and Sexual Activity  . Alcohol use: Yes    Alcohol/week: 7.0 standard drinks    Types: 7 Glasses of wine per week    Comment: per week  . Drug use: No  . Sexual activity: Not on file  Other Topics Concern  . Not on file  Social History Narrative  . Not on file   Social Determinants of Health   Financial Resource Strain:   . Difficulty of Paying Living Expenses:   Food Insecurity:   . Worried About Programme researcher, broadcasting/film/video in the Last Year:   . Barista in the Last Year:   Transportation Needs:   . Freight forwarder (Medical):   Marland Kitchen Lack of Transportation (Non-Medical):   Physical Activity:   . Days of Exercise per Week:   . Minutes of Exercise per Session:   Stress:   . Feeling of Stress :   Social Connections:   . Frequency of Communication with Friends and Family:   . Frequency of Social Gatherings with Friends and Family:   . Attends Religious Services:   . Active Member of Clubs or Organizations:   . Attends Banker Meetings:   Marland Kitchen Marital Status:      Family History: The patient's family history includes Cancer in his mother; Diabetes in his brother and sister; Hypertension in his brother.  ROS:   Review of Systems  Constitution: Negative for decreased appetite, fever and weight gain.  HENT: Negative for congestion, ear discharge, hoarse voice and sore throat.   Eyes: Negative for discharge, redness, vision loss in right eye and visual halos.  Cardiovascular: Reports chest pain, dyspnea on exertion.  Negative for, leg swelling, orthopnea and palpitations.  Respiratory: Negative for cough, hemoptysis, shortness of breath and  snoring.   Endocrine: Negative for heat intolerance and polyphagia.  Hematologic/Lymphatic: Negative for bleeding problem. Does not bruise/bleed easily.  Skin: Negative for flushing, nail changes, rash and suspicious lesions.  Musculoskeletal: Negative for arthritis, joint pain, muscle cramps, myalgias, neck pain and stiffness.  Gastrointestinal: Negative for abdominal pain, bowel incontinence, diarrhea and excessive appetite.  Genitourinary: Negative for decreased libido, genital sores and incomplete emptying.  Neurological: Negative for brief paralysis, focal weakness, headaches and loss of balance.  Psychiatric/Behavioral: Negative for altered mental status, depression and suicidal ideas.  Allergic/Immunologic: Negative for HIV exposure and persistent infections.    EKGs/Labs/Other Studies Reviewed:    The following studies were reviewed today:  EKG:  None today  Cardiac cath 11/01/2018: Diagnostic Summary Severe stenosis of the Marginal Branch, PDA Interventional Summary Successful PCI / 2.25 X 23 Xience Drug Eluting Stent of the mid Posterior Descending Coronary Artery. Successful PCI / 3.25 X 18 Xience Drug Eluting Stent of the proximal 2nd Obtuse Marginal Coronary Artery. Cardiac Arteries and Lesion Findings LMCA: Lesion on LMCA: 5% stenosis 6 mm length . LAD: Lesion on Prox LAD: 70% stenosis 23 mm length . Lesion on 1st Diag: 60% stenosis 10 mm length . LCx: Lesion on 2nd Ob Marg: Proximal subsection.95% stenosis 16 mm lengthreduced to 0%. Pre procedure TIMI III flow was noted. Post Procedure TIMI III flow was present. Good run off was present. The lesion was diagnosed as Moderate Risk (B). The lesion was heavily calcified.The lesion showed mildangulation.  Echo, 11/11/2018: 1. The left ventricle has normal systolic function, with an ejection fraction of 55-60%. The cavity size was normal. There is moderate concentric left ventricular hypertrophy. Left  ventricular diastolic Doppler parameters are consistent with impaired  relaxation. No evidence of left ventricular regional wall motion abnormalities. 2. The right ventricle has normal systolic function. The cavity was normal. There is no increase in right ventricular wall thickness. 3. The mitral valve is degenerative. There is mild mitral annular calcification present. No evidence of mitral valve stenosiswith a calculated valve area of 1.36 cm. 4. The aortic valve is tricuspid. Mild thickening of the aortic valve. Mild calcification of the aortic valve. Aortic valve regurgitation is mild by color flow Doppler. Mild stenosis of the aortic valve. 5. The aorta is normal in size and structure. 6. The aortic root, ascending aorta and aortic arch are normal in size and structure.  MPI, 12/07/2018: Study Highlights  The left ventricular ejection fraction is mildly decreased (45-54%).  Nuclear stress EF: 51%.  There was no ST segment deviation noted during stress.  The study is normal.  This is a low risk study.     Recent Labs: 11/09/2018: ALT 32 07/05/2019: BUN 13; Creatinine, Ser 0.88; Hemoglobin 17.6; Magnesium 2.0; NT-Pro BNP 128; Platelets 150; Potassium 3.9; Sodium 140  Recent Lipid Panel    Component Value Date/Time   CHOL 189 07/05/2019 0853   TRIG 156 (H) 07/05/2019 0853   HDL 43 07/05/2019 0853   CHOLHDL 4.4 07/05/2019 0853   LDLCALC 118 (H) 07/05/2019 0853    Physical Exam:    VS:  BP 136/84 (BP Location: Right Arm, Patient Position: Sitting, Cuff Size: Normal)   Pulse (!) 56   Ht 5\' 9"  (1.753 m)   Wt 255 lb (115.7 kg)   SpO2 94%   BMI 37.66 kg/m     Wt Readings from Last 3 Encounters:  07/25/19 255 lb (115.7 kg)  07/05/19 258 lb (117 kg)  02/18/19 255 lb (115.7 kg)     GEN: Well nourished, well developed in no acute distress HEENT: Normal NECK: No JVD; No carotid bruits LYMPHATICS: No lymphadenopathy CARDIAC: S1S2 noted,RRR, no murmurs, rubs,  gallops RESPIRATORY:  Clear to auscultation without rales, wheezing or rhonchi  ABDOMEN: Soft, non-tender, non-distended, +bowel sounds, no guarding. EXTREMITIES: No edema, No cyanosis, no clubbing MUSCULOSKELETAL:  No deformity  SKIN: Warm and dry NEUROLOGIC:  Alert and oriented x 3, non-focal PSYCHIATRIC:  Normal affect, good insight  ASSESSMENT:    1. Coronary artery disease of native artery of native heart with stable angina pectoris ([facility])   2. Unstable angina ([facility])   3. Essential hypertension   4. TIA (transient  ischemic attack)   5. Obesity, Class II, BMI 35-39.9    PLAN:     1. He is not responding to the antianginal and getting worse. I would like to assess for progression of CAD.  At this time I want to pursue an left heart catheterization.  The patient understands that risks include but are not limited to stroke (1 in 1000), death (1 in 1000), kidney failure [usually temporary] (1 in 500), bleeding (1 in 200), allergic reaction [possibly serious] (1 in 200), and agrees to proceed.  2. Hypertension - I will continue his current antihypertensive regimen.   3. Hyperlipidemia - continue current statin dose with zetia.  4. Obesity - continue lifestyle modification - limited with exercise due to shortness of breath and chest pain.   5. TIA - continue with aspirin and statin.   The patient is in agreement with the above plan. The patient left the office in stable condition.  The patient will follow up in 2 weeks.   Medication Adjustments/Labs and Tests Ordered: Current medicines are reviewed at length with the patient today.  Concerns regarding medicines are outlined above.  Orders Placed This Encounter  Procedures  . Basic metabolic panel  . CBC  . ECHOCARDIOGRAM COMPLETE   No orders of the defined types were placed in this encounter.   Patient Instructions  Medication Instructions:  Your physician recommends that you continue on your current medications as  directed. Please refer to the Current Medication list given to you today.  *If you need a refill on your cardiac medications before your next appointment, please call your pharmacy*   Lab Work: TODAY:  BMET & CBC  If you have labs (blood work) drawn today and your tests are completely normal, you will receive your results only by: Marland Kitchen MyChart Message (if you have MyChart) OR . A paper copy in the mail If you have any lab test that is abnormal or we need to change your treatment, we will call you to review the results.   Testing/Procedures: Your physician has requested that you have a cardiac catheterization. Cardiac catheterization is used to diagnose and/or treat various heart conditions. Doctors may recommend this procedure for a number of different reasons. The most common reason is to evaluate chest pain. Chest pain can be a symptom of coronary artery disease (CAD), and cardiac catheterization can show whether plaque is narrowing or blocking your heart's arteries. This procedure is also used to evaluate the valves, as well as measure the blood flow and oxygen levels in different parts of your heart. For further information please visit [email or web address] Please follow instruction sheet, BELOW.     [facility] [place] [address] [place] [address] Dept: [phone] Loc: [phone]  Roxie Gueye  07/25/2019  You are scheduled for a Cardiac Catheterization on Thursday, April 8 with Dr. Lance Muss.  1. Please arrive at the [facility] (Main Entrance A) at [facility]: [address] [place], [place] [address] at 5:30 AM (This time is two hours before your procedure to ensure your preparation). Free valet parking service is available.   Special note: Every effort is made to have your procedure done on time. Please understand that emergencies sometimes delay scheduled procedures.  2. Diet: Do  not eat solid foods after midnight.  The patient may have clear liquids until 5am upon the day of the procedure.  3. Labs: You will need to have blood  drawn on TODAY  4. Medication instructions in preparation for your procedure:   Contrast Allergy: No  GO TO [facility] ON TOMORROW, 07/26/2019 FOR COVID TESTING AT 11:45.  ONCE YOU HAVE BEEN SWABBED, YOU NEED TO RETURN HOME AND QUARANTINE UNTIL AFTER YOUR PROCEDURE.  Stop taking, Lasix (Furosemide)  and HTCZ (Hydrochlorothiazide) Thursday, April 8,    On the morning of your procedure, take your Plavix/Clopidogrel and any morning medicines NOT listed above.  You may use sips of water.  5. Plan for one night stay--bring personal belongings. 6. Bring a current list of your medications and current insurance cards. 7. You MUST have a responsible person to drive you home. 8. Someone MUST be with you the first 24 hours after you arrive home or your discharge will be delayed. 9. Please wear clothes that are easy to get on and off and wear slip-on shoes.  Thank you for allowing us to care for you!   -- [facility]     Follow-Up: At [facility], you and your health needs are our priority.  As part of our continuing mission to provide you with exceptional heart care, we have created designated Provider Care Teams.  These Care Teams include your primary Cardiologist (physician) and Advanced Practice Providers (APPs -  Physician Assistants and Nurse Practitioners) who all work together to provide you with the care you need, when you need it.  We recommend signing up for the patient portal called "MyChart".  Sign up information is provided on this After Visit Summary.  MyChart is used to connect with patients for Virtual Visits (Telemedicine).  Patients are able to view lab/test results, encounter notes, upcoming appointments, etc.  Non-urgent messages can be sent to your provider as well.   To learn more  about what you can do with MyChart, go to [email or web address].    Your next appointment:   2 week(s)  08/12/19 AT 2:15  The format for your next appointment:   In Person  Provider:   Thomasene RippleKardie Zamya Culhane, DO   Other Instructions     Adopting a Healthy Lifestyle.  Know what a healthy weight is for you (roughly BMI <25) and aim to maintain this   Aim for 7+ servings of fruits and vegetables daily   65-80+ fluid ounces of water or unsweet tea for healthy kidneys   Limit to max 1 drink of alcohol per day; avoid smoking/tobacco   Limit animal fats in diet for cholesterol and heart health - choose grass fed whenever available   Avoid highly processed foods, and foods high in saturated/trans fats   Aim for low stress - take time to unwind and care for your mental health   Aim for 150 min of moderate intensity exercise weekly for heart health, and weights twice weekly for bone health   Aim for 7-9 hours of sleep daily   When it comes to diets, agreement about the perfect plan isnt easy to find, even among the experts. Experts at the [employer] developed an idea known as the Healthy Eating Plate. Just imagine a plate divided into logical, healthy portions.   The emphasis is on diet quality:   Load up on vegetables and fruits - one-half of your plate: Aim for color and variety, and remember that potatoes dont count.   Go for whole grains - one-quarter of your plate: Whole wheat, barley, wheat berries, quinoa, oats, brown rice, and foods made with them. If you  want pasta, go with whole wheat pasta.   Protein power - one-quarter of your plate: Fish, chicken, beans, and nuts are all healthy, versatile protein sources. Limit red meat.   The diet, however, does go beyond the plate, offering a few other suggestions.   Use healthy plant oils, such as olive, canola, soy, corn, sunflower and peanut. Check the labels, and avoid partially hydrogenated oil, which have  unhealthy trans fats.   If youre thirsty, drink water. Coffee and tea are good in moderation, but skip sugary drinks and limit milk and dairy products to one or two daily servings.   The type of carbohydrate in the diet is more important than the amount. Some sources of carbohydrates, such as vegetables, fruits, whole grains, and beans-are healthier than others.   Finally, stay active  Signed, Thomasene Ripple, DO  07/25/2019 9:44 PM    [facility]

## 2019-07-25 NOTE — H&P (View-Only) (Signed)
Cardiology Office Note:    Date:  07/25/2019   ID:  Craig Lawson, DOB 12/18/1945, MRN [id number]  PCP:  Tarri Fuller, MD  Cardiologist:  Norman Herrlich, MD  Electrophysiologist:  None   Referring MD: Tarri Fuller, MD   Chief Complaint  Patient presents with  . Follow-up    History of Present Illness:     Craig Lawson a 74 y.o.malewith a hx of CAD nonSTEMI 10/20/27 with PCI and stent of OM and RCAmild AS/AR, hypertension, hyperlipidemia and LBBB. I did see the patient 1 July 05, 2019 at that time he reported that he has been experiencing significant shortness of breath on exertion.  He noted that he did experience some intermittent chest pain with this.  He described it as a dull sensation of left-sided chest pain which started every time he exerted himself and then resolves with rest. At that visit, I started the patient on antianginal with renaxa, due to bilateral leg edema started lasix with potassium supplement and ask him to follow up in 2 weeks. A TTE was also ordered to assess his Aortic stenosis.   Today he reports that he has been taking his medication as prescribed and feels that he is getting worse. He stated " it feels similar to when I get my last stent. I am worried as I do not want to end up like the last time when I started to experienced symptoms sitting". No other complaints at this time.    Past Medical History:  Diagnosis Date  . Aortic stenosis, mild 08/10/2015  . Bilateral leg edema 08/22/2015  . Bradycardia 06/01/2017  . Cellulitis of left lower extremity 04/29/2017  . Coronary artery disease involving native coronary artery 10/22/2017  . DOE (dyspnea on exertion) 05/04/2017  . Essential hypertension 08/13/2015  . Heart murmur 04/29/2017  . History of transient ischemic attack (TIA) 10/30/2017  . Kissing, osteophytes 08/22/2015  . LBBB (left bundle branch block) 08/22/2015  . Lipid disorder 11/30/2017  . Lumbar facet joint pain 08/22/2015  . Medicare annual  wellness visit, subsequent 06/02/2016  . Mild aortic regurgitation 08/10/2015  . Mild aortic stenosis 06/01/2017  . Morbid obesity (HCC) 10/22/2017  . Murmur 04/29/2017  . Need for vaccination for Strep pneumoniae 03/27/2017  . Non-ST elevation myocardial infarction (NSTEMI), subendocardial infarction, subsequent episode of care (HCC) 11/30/2017  . Obesity, Class II, BMI 35-39.9 08/10/2015  . OSA on CPAP 12/02/2017  . Pure hypercholesterolemia 05/24/2015  . Seborrheic keratoses 03/27/2017   Overview:  Of the right forehead.  . Statin intolerance 11/30/2017  . Thrombocytopenia (HCC) 04/29/2017  . TIA (transient ischemic attack) 12/19/2016  . 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 Venous insufficiency 08/10/2015    Past Surgical History:  Procedure Laterality Date  . CARDIAC CATHETERIZATION    . CHOLECYSTECTOMY    . CORONARY ANGIOPLASTY    . ESOPHAGOGASTRODUODENOSCOPY      Current Medications: Current Meds  Medication Sig  . aspirin EC 81 MG tablet Take 1 tablet (81 mg total) by mouth daily.  . clopidogrel (PLAVIX) 75 MG tablet TAKE 1 TABLET BY MOUTH  DAILY  . ezetimibe (ZETIA) 10 MG tablet Take 1 tablet (10 mg total) by mouth daily.  . furosemide (LASIX) 20 MG tablet Take 1 tablet (20 mg total) by mouth daily.  . hydrochlorothiazide (HYDRODIURIL) 25 MG tablet Take 25 mg by mouth daily.   Marland Kitchen losartan (COZAAR) 50 MG tablet TAKE 1 TABLET BY MOUTH  DAILY  . nitroGLYCERIN (NITROSTAT) 0.4 MG  SL tablet Place 1 tablet (0.4 mg total) under the tongue every 5 (five) minutes as needed for chest pain.  . potassium chloride SA (KLOR-CON) 20 MEQ tablet Take 1 tablet (20 mEq total) by mouth daily.  . pravastatin (PRAVACHOL) 20 MG tablet TAKE 1 TABLET BY MOUTH 2  TIMES PER WEEK  . ranolazine (RANEXA) 500 MG 12 hr tablet Take 1 tablet (500 mg total) by mouth 2 (two) times daily.     Allergies:   Statins, Isosorbide, and Latex   Social History   Socioeconomic History  . Marital status: Married    Spouse name:  Not on file  . Number of children: Not on file  . Years of education: Not on file  . Highest education level: Not on file  Occupational History  . Not on file  Tobacco Use  . Smoking status: Former Smoker    Types: Cigarettes    Quit date: 1973    Years since quitting: 48.2  . Smokeless tobacco: Never Used  Substance and Sexual Activity  . Alcohol use: Yes    Alcohol/week: 7.0 standard drinks    Types: 7 Glasses of wine per week    Comment: per week  . Drug use: No  . Sexual activity: Not on file  Other Topics Concern  . Not on file  Social History Narrative  . Not on file   Social Determinants of Health   Financial Resource Strain:   . Difficulty of Paying Living Expenses:   Food Insecurity:   . Worried About Programme researcher, broadcasting/film/video in the Last Year:   . Barista in the Last Year:   Transportation Needs:   . Freight forwarder (Medical):   Marland Kitchen Lack of Transportation (Non-Medical):   Physical Activity:   . Days of Exercise per Week:   . Minutes of Exercise per Session:   Stress:   . Feeling of Stress :   Social Connections:   . Frequency of Communication with Friends and Family:   . Frequency of Social Gatherings with Friends and Family:   . Attends Religious Services:   . Active Member of Clubs or Organizations:   . Attends Banker Meetings:   Marland Kitchen Marital Status:      Family History: The patient's family history includes Cancer in his mother; Diabetes in his brother and sister; Hypertension in his brother.  ROS:   Review of Systems  Constitution: Negative for decreased appetite, fever and weight gain.  HENT: Negative for congestion, ear discharge, hoarse voice and sore throat.   Eyes: Negative for discharge, redness, vision loss in right eye and visual halos.  Cardiovascular: Reports chest pain, dyspnea on exertion.  Negative for, leg swelling, orthopnea and palpitations.  Respiratory: Negative for cough, hemoptysis, shortness of breath and  snoring.   Endocrine: Negative for heat intolerance and polyphagia.  Hematologic/Lymphatic: Negative for bleeding problem. Does not bruise/bleed easily.  Skin: Negative for flushing, nail changes, rash and suspicious lesions.  Musculoskeletal: Negative for arthritis, joint pain, muscle cramps, myalgias, neck pain and stiffness.  Gastrointestinal: Negative for abdominal pain, bowel incontinence, diarrhea and excessive appetite.  Genitourinary: Negative for decreased libido, genital sores and incomplete emptying.  Neurological: Negative for brief paralysis, focal weakness, headaches and loss of balance.  Psychiatric/Behavioral: Negative for altered mental status, depression and suicidal ideas.  Allergic/Immunologic: Negative for HIV exposure and persistent infections.    EKGs/Labs/Other Studies Reviewed:    The following studies were reviewed today:  EKG:  None today  Cardiac cath 11/01/2018: Diagnostic Summary Severe stenosis of the Marginal Branch, PDA Interventional Summary Successful PCI / 2.25 X 23 Xience Drug Eluting Stent of the mid Posterior Descending Coronary Artery. Successful PCI / 3.25 X 18 Xience Drug Eluting Stent of the proximal 2nd Obtuse Marginal Coronary Artery. Cardiac Arteries and Lesion Findings LMCA: Lesion on LMCA: 5% stenosis 6 mm length . LAD: Lesion on Prox LAD: 70% stenosis 23 mm length . Lesion on 1st Diag: 60% stenosis 10 mm length . LCx: Lesion on 2nd Ob Marg: Proximal subsection.95% stenosis 16 mm lengthreduced to 0%. Pre procedure TIMI III flow was noted. Post Procedure TIMI III flow was present. Good run off was present. The lesion was diagnosed as Moderate Risk (B). The lesion was heavily calcified.The lesion showed mildangulation.  Echo, 11/11/2018: 1. The left ventricle has normal systolic function, with an ejection fraction of 55-60%. The cavity size was normal. There is moderate concentric left ventricular hypertrophy. Left  ventricular diastolic Doppler parameters are consistent with impaired  relaxation. No evidence of left ventricular regional wall motion abnormalities. 2. The right ventricle has normal systolic function. The cavity was normal. There is no increase in right ventricular wall thickness. 3. The mitral valve is degenerative. There is mild mitral annular calcification present. No evidence of mitral valve stenosiswith a calculated valve area of 1.36 cm. 4. The aortic valve is tricuspid. Mild thickening of the aortic valve. Mild calcification of the aortic valve. Aortic valve regurgitation is mild by color flow Doppler. Mild stenosis of the aortic valve. 5. The aorta is normal in size and structure. 6. The aortic root, ascending aorta and aortic arch are normal in size and structure.  MPI, 12/07/2018: Study Highlights  The left ventricular ejection fraction is mildly decreased (45-54%).  Nuclear stress EF: 51%.  There was no ST segment deviation noted during stress.  The study is normal.  This is a low risk study.     Recent Labs: 11/09/2018: ALT 32 07/05/2019: BUN 13; Creatinine, Ser 0.88; Hemoglobin 17.6; Magnesium 2.0; NT-Pro BNP 128; Platelets 150; Potassium 3.9; Sodium 140  Recent Lipid Panel    Component Value Date/Time   CHOL 189 07/05/2019 0853   TRIG 156 (H) 07/05/2019 0853   HDL 43 07/05/2019 0853   CHOLHDL 4.4 07/05/2019 0853   LDLCALC 118 (H) 07/05/2019 0853    Physical Exam:    VS:  BP 136/84 (BP Location: Right Arm, Patient Position: Sitting, Cuff Size: Normal)   Pulse (!) 56   Ht 5\' 9"  (1.753 m)   Wt 255 lb (115.7 kg)   SpO2 94%   BMI 37.66 kg/m     Wt Readings from Last 3 Encounters:  07/25/19 255 lb (115.7 kg)  07/05/19 258 lb (117 kg)  02/18/19 255 lb (115.7 kg)     GEN: Well nourished, well developed in no acute distress HEENT: Normal NECK: No JVD; No carotid bruits LYMPHATICS: No lymphadenopathy CARDIAC: S1S2 noted,RRR, no murmurs, rubs,  gallops RESPIRATORY:  Clear to auscultation without rales, wheezing or rhonchi  ABDOMEN: Soft, non-tender, non-distended, +bowel sounds, no guarding. EXTREMITIES: No edema, No cyanosis, no clubbing MUSCULOSKELETAL:  No deformity  SKIN: Warm and dry NEUROLOGIC:  Alert and oriented x 3, non-focal PSYCHIATRIC:  Normal affect, good insight  ASSESSMENT:    1. Coronary artery disease of native artery of native heart with stable angina pectoris ([place])   2. Unstable angina ([place])   3. Essential hypertension   4. TIA (transient  ischemic attack)   5. Obesity, Class II, BMI 35-39.9    PLAN:     1. He is not responding to the antianginal and getting worse. I would like to assess for progression of CAD.  At this time I want to pursue an left heart catheterization.  The patient understands that risks include but are not limited to stroke (1 in 1000), death (1 in 1000), kidney failure [usually temporary] (1 in 500), bleeding (1 in 200), allergic reaction [possibly serious] (1 in 200), and agrees to proceed.  2. Hypertension - I will continue his current antihypertensive regimen.   3. Hyperlipidemia - continue current statin dose with zetia.  4. Obesity - continue lifestyle modification - limited with exercise due to shortness of breath and chest pain.   5. TIA - continue with aspirin and statin.   The patient is in agreement with the above plan. The patient left the office in stable condition.  The patient will follow up in 2 weeks.   Medication Adjustments/Labs and Tests Ordered: Current medicines are reviewed at length with the patient today.  Concerns regarding medicines are outlined above.  Orders Placed This Encounter  Procedures  . Basic metabolic panel  . CBC  . ECHOCARDIOGRAM COMPLETE   No orders of the defined types were placed in this encounter.   Patient Instructions  Medication Instructions:  Your physician recommends that you continue on your current medications as  directed. Please refer to the Current Medication list given to you today.  *If you need a refill on your cardiac medications before your next appointment, please call your pharmacy*   Lab Work: TODAY:  BMET & CBC  If you have labs (blood work) drawn today and your tests are completely normal, you will receive your results only by: Marland Kitchen MyChart Message (if you have MyChart) OR . A paper copy in the mail If you have any lab test that is abnormal or we need to change your treatment, we will call you to review the results.   Testing/Procedures: Your physician has requested that you have a cardiac catheterization. Cardiac catheterization is used to diagnose and/or treat various heart conditions. Doctors may recommend this procedure for a number of different reasons. The most common reason is to evaluate chest pain. Chest pain can be a symptom of coronary artery disease (CAD), and cardiac catheterization can show whether plaque is narrowing or blocking your heart's arteries. This procedure is also used to evaluate the valves, as well as measure the blood flow and oxygen levels in different parts of your heart. For further information please visit [email or web address] Please follow instruction sheet, BELOW.     [facility] CARDIOVASCULAR DIVISION [facility] AT [place] [address] [place] [address] Dept: [phone] Loc: [phone]  Roxie Gueye  07/25/2019  You are scheduled for a Cardiac Catheterization on Thursday, April 8 with Dr. Lance Muss.  1. Please arrive at the [facility] (Main Entrance A) at [facility]: [address] [place], [place] [address] at 5:30 AM (This time is two hours before your procedure to ensure your preparation). Free valet parking service is available.   Special note: Every effort is made to have your procedure done on time. Please understand that emergencies sometimes delay scheduled procedures.  2. Diet: Do  not eat solid foods after midnight.  The patient may have clear liquids until 5am upon the day of the procedure.  3. Labs: You will need to have blood  drawn on TODAY  4. Medication instructions in preparation for your procedure:   Contrast Allergy: No  GO TO [facility] ON TOMORROW, 07/26/2019 FOR COVID TESTING AT 11:45.  ONCE YOU HAVE BEEN SWABBED, YOU NEED TO RETURN HOME AND QUARANTINE UNTIL AFTER YOUR PROCEDURE.  Stop taking, Lasix (Furosemide)  and HTCZ (Hydrochlorothiazide) Thursday, April 8,    On the morning of your procedure, take your Plavix/Clopidogrel and any morning medicines NOT listed above.  You may use sips of water.  5. Plan for one night stay--bring personal belongings. 6. Bring a current list of your medications and current insurance cards. 7. You MUST have a responsible person to drive you home. 8. Someone MUST be with you the first 24 hours after you arrive home or your discharge will be delayed. 9. Please wear clothes that are easy to get on and off and wear slip-on shoes.  Thank you for allowing us to care for you!   -- [facility]     Follow-Up: At [facility], you and your health needs are our priority.  As part of our continuing mission to provide you with exceptional heart care, we have created designated Provider Care Teams.  These Care Teams include your primary Cardiologist (physician) and Advanced Practice Providers (APPs -  Physician Assistants and Nurse Practitioners) who all work together to provide you with the care you need, when you need it.  We recommend signing up for the patient portal called "MyChart".  Sign up information is provided on this After Visit Summary.  MyChart is used to connect with patients for Virtual Visits (Telemedicine).  Patients are able to view lab/test results, encounter notes, upcoming appointments, etc.  Non-urgent messages can be sent to your provider as well.   To learn more  about what you can do with MyChart, go to [email or web address].    Your next appointment:   2 week(s)  08/12/19 AT 2:15  The format for your next appointment:   In Person  Provider:   Thomasene RippleKardie Uriyah Massimo, DO   Other Instructions     Adopting a Healthy Lifestyle.  Know what a healthy weight is for you (roughly BMI <25) and aim to maintain this   Aim for 7+ servings of fruits and vegetables daily   65-80+ fluid ounces of water or unsweet tea for healthy kidneys   Limit to max 1 drink of alcohol per day; avoid smoking/tobacco   Limit animal fats in diet for cholesterol and heart health - choose grass fed whenever available   Avoid highly processed foods, and foods high in saturated/trans fats   Aim for low stress - take time to unwind and care for your mental health   Aim for 150 min of moderate intensity exercise weekly for heart health, and weights twice weekly for bone health   Aim for 7-9 hours of sleep daily   When it comes to diets, agreement about the perfect plan isnt easy to find, even among the experts. Experts at the [employer] developed an idea known as the Healthy Eating Plate. Just imagine a plate divided into logical, healthy portions.   The emphasis is on diet quality:   Load up on vegetables and fruits - one-half of your plate: Aim for color and variety, and remember that potatoes dont count.   Go for whole grains - one-quarter of your plate: Whole wheat, barley, wheat berries, quinoa, oats, brown rice, and foods made with them. If you  want pasta, go with whole wheat pasta.   Protein power - one-quarter of your plate: Fish, chicken, beans, and nuts are all healthy, versatile protein sources. Limit red meat.   The diet, however, does go beyond the plate, offering a few other suggestions.   Use healthy plant oils, such as olive, canola, soy, corn, sunflower and peanut. Check the labels, and avoid partially hydrogenated oil, which have  unhealthy trans fats.   If youre thirsty, drink water. Coffee and tea are good in moderation, but skip sugary drinks and limit milk and dairy products to one or two daily servings.   The type of carbohydrate in the diet is more important than the amount. Some sources of carbohydrates, such as vegetables, fruits, whole grains, and beans-are healthier than others.   Finally, stay active  Signed, Thomasene Ripple, DO  07/25/2019 9:44 PM    [facility]

## 2019-07-25 NOTE — Patient Instructions (Signed)
Medication Instructions:  Your physician recommends that you continue on your current medications as directed. Please refer to the Current Medication list given to you today.  *If you need a refill on your cardiac medications before your next appointment, please call your pharmacy*   Lab Work: TODAY:  BMET & CBC  If you have labs (blood work) drawn today and your tests are completely normal, you will receive your results only by: Marland Kitchen MyChart Message (if you have MyChart) OR . A paper copy in the mail If you have any lab test that is abnormal or we need to change your treatment, we will call you to review the results.   Testing/Procedures: Your physician has requested that you have a cardiac catheterization. Cardiac catheterization is used to diagnose and/or treat various heart conditions. Doctors may recommend this procedure for a number of different reasons. The most common reason is to evaluate chest pain. Chest pain can be a symptom of coronary artery disease (CAD), and cardiac catheterization can show whether plaque is narrowing or blocking your heart's arteries. This procedure is also used to evaluate the valves, as well as measure the blood flow and oxygen levels in different parts of your heart. For further information please visit [email or web address] Please follow instruction sheet, BELOW.     [facility] CARDI[facility]ASCULAR DIVISION [facility] AT [place] [address] [place] [address] Dept: [phone] Loc: [phone]  Craig Lawson  07/25/2019  You are scheduled for a Cardiac Catheterization on Thursday, April 8 with Dr. Lance Muss.  1. Please arrive at the [facility] (Main Entrance A) at [facility]: [address] [place], [place] [address] at 5:30 AM (This time is two hours before your procedure to ensure your preparation). Free valet parking service is available.   Special note: Every effort is made to have your procedure  done on time. Please understand that emergencies sometimes delay scheduled procedures.  2. Diet: Do not eat solid foods after midnight.  The patient may have clear liquids until 5am upon the day of the procedure.  3. Labs: You will need to have blood drawn on TODAY  4. Medication instructions in preparation for your procedure:   Contrast Allergy: No  GO TO [facility] ON TOMORROW, 07/26/2019 FOR COVID TESTING AT 11:45.  ONCE YOU HAVE BEEN SWABBED, YOU NEED TO RETURN HOME AND QUARANTINE UNTIL AFTER YOUR PROCEDURE.  Stop taking, Lasix (Furosemide)  and HTCZ (Hydrochlorothiazide) Thursday, April 8,    On the morning of your procedure, take your Plavix/Clopidogrel and any morning medicines NOT listed above.  You may use sips of water.  5. Plan for one night stay--bring personal belongings. 6. Bring a current list of your medications and current insurance cards. 7. You MUST have a responsible person to drive you home. 8. Someone MUST be with you the first 24 hours after you arrive home or your discharge will be delayed. 9. Please wear clothes that are easy to get on and off and wear slip-on shoes.  Thank you for allowing [place] to care for you!   -- [facility]     Follow-Up: At [facility], you and your health needs are our priority.  As part of our continuing mission to provide you with exceptional heart care, we have created designated Provider Care Teams.  These Care Teams include your primary Cardiologist (physician) and Advanced Practice Providers (APPs -  Physician Assistants and Nurse Practitioners) who all work  together to provide you with the care you need, when you need it.  We recommend signing up for the patient portal called "MyChart".  Sign up information is provided on this After Visit Summary.  MyChart is used to connect with patients for Virtual Visits (Telemedicine).  Patients are able to view lab/test results, encounter notes,  upcoming appointments, etc.  Non-urgent messages can be sent to your provider as well.   To learn more about what you can do with MyChart, go to [email or web address].    Your next appointment:   2 week(s)  08/12/19 AT 2:15  The format for your next appointment:   In Person  Provider:   Berniece Salines, DO   Other Instructions

## 2019-07-26 ENCOUNTER — Other Ambulatory Visit (HOSPITAL_COMMUNITY)
Admission: RE | Admit: 2019-07-26 | Discharge: 2019-07-26 | Disposition: A | Discharge status: Home / Self Care | Payer: Medicare Other | Source: Ambulatory Visit | Attending: Interventional Cardiology | Admitting: Interventional Cardiology

## 2019-07-26 DIAGNOSIS — Z20822 Contact with and (suspected) exposure to covid-19: Secondary | ICD-10-CM | POA: Diagnosis not present

## 2019-07-26 DIAGNOSIS — Z01812 Encounter for preprocedural laboratory examination: Secondary | ICD-10-CM | POA: Diagnosis present

## 2019-07-26 LAB — CBC
Hematocrit: 48.8 % (ref 37.5–51.0)
Hemoglobin: 17.3 g/dL (ref 13.0–17.7)
MCH: 31.7 pg (ref 26.6–33.0)
MCHC: 35.5 g/dL (ref 31.5–35.7)
MCV: 90 fL (ref 79–97)
Platelets: 166 10*3/uL (ref 150–450)
RBC: 5.45 x10E6/uL (ref 4.14–5.80)
RDW: 14 % (ref 11.6–15.4)
WBC: 7.5 10*3/uL (ref 3.4–10.8)

## 2019-07-26 LAB — BASIC METABOLIC PANEL
BUN/Creatinine Ratio: 12 (ref 10–24)
BUN: 12 mg/dL (ref 8–27)
CO2: 22 mmol/L (ref 20–29)
Calcium: 10 mg/dL (ref 8.6–10.2)
Chloride: 101 mmol/L (ref 96–106)
Creatinine, Ser: 0.97 mg/dL (ref 0.76–1.27)
GFR calc Af Amer: 88 mL/min/{1.73_m2} (ref >59)
GFR calc non Af Amer: 76 mL/min/{1.73_m2} (ref >59)
Glucose: 77 mg/dL (ref 65–99)
Potassium: 4.3 mmol/L (ref 3.5–5.2)
Sodium: 142 mmol/L (ref 134–144)

## 2019-07-26 LAB — SARS CORONAVIRUS 2 (TAT 6-24 HRS): SARS Coronavirus 2: NEGATIVE

## 2019-07-27 ENCOUNTER — Telehealth: Payer: Self-pay | Admitting: *Deleted

## 2019-07-27 NOTE — Telephone Encounter (Signed)
Pt contacted pre-catheterization scheduled at [facility] for: Thursday July 28, 2019 7:30 AM Verified arrival time and place: [facility] Main Entrance A [facility]) at: 5:30 AM   No solid food after midnight prior to cath, clear liquids until 5 AM day of procedure.  Hold: Lasix-AM of procedure HCTZ-HS prior to procedure KCl-pt states not currently taking  Except hold medications AM meds can be  taken pre-cath with sip of water including: ASA 81 mg Plavix 75 mg  Confirmed patient has responsible adult to drive home post procedure and observe 24 hours after arriving home: yes  Currently, due to Covid-19 pandemic, only one person will be allowed with patient. Must be the same person for patient's entire stay and will be required to wear a mask. They will be asked to wait in the waiting room for the duration of the patient's stay.  Patients are required to wear a mask when they enter the hospital.     COVID-19 Pre-Screening Questions:  . In the past 7 to 10 days have you had a cough,  shortness of breath, headache, congestion, fever (100 or greater) body aches, chills, sore throat, or sudden loss of taste or sense of smell? no . Have you been around anyone with known Covid 19 in the past 7-10 days? no . Have you been around anyone who is awaiting Covid 19 test results in the past 7 to 10 days? no . Have you been around anyone who has mentioned symptoms of Covid 19 within the past 7 to 10 days? no   Reviewed procedure/mask/visitor instructions, COVID-19 screening questions with patient.

## 2019-07-28 ENCOUNTER — Other Ambulatory Visit: Payer: Self-pay

## 2019-07-28 ENCOUNTER — Encounter (HOSPITAL_COMMUNITY)
Admission: RE | Disposition: A | Discharge status: Home / Self Care | Payer: Medicare Other | Source: Home / Self Care | Attending: Interventional Cardiology

## 2019-07-28 ENCOUNTER — Ambulatory Visit (HOSPITAL_COMMUNITY)
Admission: RE | Admit: 2019-07-28 | Discharge: 2019-07-29 | Disposition: A | Discharge status: Home / Self Care | Payer: Medicare Other | Attending: Interventional Cardiology | Admitting: Interventional Cardiology

## 2019-07-28 DIAGNOSIS — I25110 Atherosclerotic heart disease of native coronary artery with unstable angina pectoris: Secondary | ICD-10-CM | POA: Insufficient documentation

## 2019-07-28 DIAGNOSIS — I351 Nonrheumatic aortic (valve) insufficiency: Secondary | ICD-10-CM | POA: Diagnosis present

## 2019-07-28 DIAGNOSIS — I200 Unstable angina: Secondary | ICD-10-CM

## 2019-07-28 DIAGNOSIS — I350 Nonrheumatic aortic (valve) stenosis: Secondary | ICD-10-CM | POA: Insufficient documentation

## 2019-07-28 DIAGNOSIS — Z87891 Personal history of nicotine dependence: Secondary | ICD-10-CM | POA: Insufficient documentation

## 2019-07-28 DIAGNOSIS — Z9861 Coronary angioplasty status: Secondary | ICD-10-CM | POA: Diagnosis present

## 2019-07-28 DIAGNOSIS — I25118 Atherosclerotic heart disease of native coronary artery with other forms of angina pectoris: Secondary | ICD-10-CM | POA: Diagnosis not present

## 2019-07-28 DIAGNOSIS — E785 Hyperlipidemia, unspecified: Secondary | ICD-10-CM | POA: Insufficient documentation

## 2019-07-28 DIAGNOSIS — G4733 Obstructive sleep apnea (adult) (pediatric): Secondary | ICD-10-CM | POA: Insufficient documentation

## 2019-07-28 DIAGNOSIS — Z79899 Other long term (current) drug therapy: Secondary | ICD-10-CM | POA: Insufficient documentation

## 2019-07-28 DIAGNOSIS — I447 Left bundle-branch block, unspecified: Secondary | ICD-10-CM | POA: Insufficient documentation

## 2019-07-28 DIAGNOSIS — Z8673 Personal history of transient ischemic attack (TIA), and cerebral infarction without residual deficits: Secondary | ICD-10-CM | POA: Insufficient documentation

## 2019-07-28 DIAGNOSIS — I252 Old myocardial infarction: Secondary | ICD-10-CM | POA: Diagnosis not present

## 2019-07-28 DIAGNOSIS — I2510 Atherosclerotic heart disease of native coronary artery without angina pectoris: Secondary | ICD-10-CM | POA: Diagnosis present

## 2019-07-28 DIAGNOSIS — I872 Venous insufficiency (chronic) (peripheral): Secondary | ICD-10-CM | POA: Insufficient documentation

## 2019-07-28 DIAGNOSIS — I10 Essential (primary) hypertension: Secondary | ICD-10-CM | POA: Diagnosis not present

## 2019-07-28 DIAGNOSIS — Z7982 Long term (current) use of aspirin: Secondary | ICD-10-CM | POA: Insufficient documentation

## 2019-07-28 DIAGNOSIS — E7800 Pure hypercholesterolemia, unspecified: Secondary | ICD-10-CM | POA: Insufficient documentation

## 2019-07-28 DIAGNOSIS — Z955 Presence of coronary angioplasty implant and graft: Secondary | ICD-10-CM

## 2019-07-28 DIAGNOSIS — Z6836 Body mass index (BMI) 36.0-36.9, adult: Secondary | ICD-10-CM | POA: Diagnosis not present

## 2019-07-28 HISTORY — PX: INTRAVASCULAR ULTRASOUND/IVUS: CATH118244

## 2019-07-28 HISTORY — PX: CORONARY BALLOON ANGIOPLASTY: CATH118233

## 2019-07-28 HISTORY — DX: Atherosclerotic heart disease of native coronary artery without angina pectoris: I25.10

## 2019-07-28 HISTORY — PX: LEFT HEART CATH AND CORONARY ANGIOGRAPHY: CATH118249

## 2019-07-28 LAB — POCT ACTIVATED CLOTTING TIME
Activated Clotting Time: 296 seconds
Activated Clotting Time: 235 seconds
Activated Clotting Time: 307 seconds

## 2019-07-28 LAB — NO BLOOD PRODUCTS

## 2019-07-28 SURGERY — LEFT HEART CATH AND CORONARY ANGIOGRAPHY
Anesthesia: LOCAL

## 2019-07-28 MED ORDER — POTASSIUM CHLORIDE CRYS ER 20 MEQ PO TBCR
20.0000 meq | EXTENDED_RELEASE_TABLET | Freq: Every day | ORAL | Status: DC
  Administered 2019-07-28 – 2019-07-29 (×2): 20 meq via ORAL
  Filled 2019-07-28 (×2): qty 1

## 2019-07-28 MED ORDER — SODIUM CHLORIDE 0.9 % WEIGHT BASED INFUSION: 1.0000 mL/kg/h | INTRAVENOUS | Status: DC

## 2019-07-28 MED ORDER — ASPIRIN EC 81 MG PO TBEC
81.0000 mg | DELAYED_RELEASE_TABLET | Freq: Every day | ORAL | Status: DC
  Administered 2019-07-29: 11:00:00 81 mg via ORAL
  Filled 2019-07-28: qty 1

## 2019-07-28 MED ORDER — CLOPIDOGREL BISULFATE 75 MG PO TABS: 75.0000 mg | ORAL_TABLET | Freq: Every day | ORAL | Status: DC

## 2019-07-28 MED ORDER — LIDOCAINE HCL (PF) 1 % IJ SOLN
INTRAMUSCULAR | Status: AC
  Filled 2019-07-28: qty 30

## 2019-07-28 MED ORDER — SODIUM CHLORIDE 0.9 % IV SOLN: 250.0000 mL | INTRAVENOUS | Status: DC | PRN

## 2019-07-28 MED ORDER — ONDANSETRON HCL 4 MG/2ML IJ SOLN: 4.0000 mg | Freq: Four times a day (QID) | INTRAMUSCULAR | Status: DC | PRN

## 2019-07-28 MED ORDER — HYDRALAZINE HCL 20 MG/ML IJ SOLN: 10.0000 mg | INTRAMUSCULAR | Status: AC | PRN

## 2019-07-28 MED ORDER — FENTANYL CITRATE (PF) 100 MCG/2ML IJ SOLN
INTRAMUSCULAR | Status: AC
  Filled 2019-07-28: qty 2

## 2019-07-28 MED ORDER — SODIUM CHLORIDE 0.9 % WEIGHT BASED INFUSION
3.0000 mL/kg/h | INTRAVENOUS | Status: DC
  Administered 2019-07-28: 06:00:00 3 mL/kg/h via INTRAVENOUS

## 2019-07-28 MED ORDER — MIDAZOLAM HCL 2 MG/2ML IJ SOLN
INTRAMUSCULAR | Status: DC | PRN
  Administered 2019-07-28 (×3): 1 mg via INTRAVENOUS
  Administered 2019-07-28: 2 mg via INTRAVENOUS

## 2019-07-28 MED ORDER — ASPIRIN 81 MG PO CHEW: 81.0000 mg | CHEWABLE_TABLET | Freq: Every day | ORAL | Status: DC

## 2019-07-28 MED ORDER — HEPARIN (PORCINE) IN NACL 1000-0.9 UT/500ML-% IV SOLN
INTRAVENOUS | Status: DC | PRN
  Administered 2019-07-28 (×2): 500 mL

## 2019-07-28 MED ORDER — NITROGLYCERIN 0.4 MG SL SUBL: 0.4000 mg | SUBLINGUAL_TABLET | SUBLINGUAL | Status: DC | PRN

## 2019-07-28 MED ORDER — LIDOCAINE HCL (PF) 1 % IJ SOLN
INTRAMUSCULAR | Status: DC | PRN
  Administered 2019-07-28: 2 mL

## 2019-07-28 MED ORDER — ACETAMINOPHEN 325 MG PO TABS: 650.0000 mg | ORAL_TABLET | ORAL | Status: DC | PRN

## 2019-07-28 MED ORDER — HEPARIN SODIUM (PORCINE) 1000 UNIT/ML IJ SOLN
INTRAMUSCULAR | Status: AC
  Filled 2019-07-28: qty 1

## 2019-07-28 MED ORDER — SODIUM CHLORIDE 0.9% FLUSH: 3.0000 mL | INTRAVENOUS | Status: DC | PRN

## 2019-07-28 MED ORDER — MIDAZOLAM HCL 2 MG/2ML IJ SOLN
INTRAMUSCULAR | Status: AC
  Filled 2019-07-28: qty 2

## 2019-07-28 MED ORDER — SODIUM CHLORIDE 0.9% FLUSH: 3.0000 mL | Freq: Two times a day (BID) | INTRAVENOUS | Status: DC

## 2019-07-28 MED ORDER — IOHEXOL 350 MG/ML SOLN
INTRAVENOUS | Status: DC | PRN
  Administered 2019-07-28: 10:00:00 130 mL

## 2019-07-28 MED ORDER — LOSARTAN POTASSIUM 50 MG PO TABS
50.0000 mg | ORAL_TABLET | Freq: Every day | ORAL | Status: DC
  Administered 2019-07-28: 50 mg via ORAL
  Filled 2019-07-28: qty 1

## 2019-07-28 MED ORDER — HYDROCHLOROTHIAZIDE 25 MG PO TABS
25.0000 mg | ORAL_TABLET | Freq: Every day | ORAL | Status: DC
  Administered 2019-07-28: 25 mg via ORAL
  Filled 2019-07-28: qty 1

## 2019-07-28 MED ORDER — FUROSEMIDE 20 MG PO TABS
20.0000 mg | ORAL_TABLET | Freq: Every day | ORAL | Status: DC
  Administered 2019-07-28 – 2019-07-29 (×2): 20 mg via ORAL
  Filled 2019-07-28 (×2): qty 1

## 2019-07-28 MED ORDER — FENTANYL CITRATE (PF) 100 MCG/2ML IJ SOLN
INTRAMUSCULAR | Status: DC | PRN
  Administered 2019-07-28 (×4): 25 ug via INTRAVENOUS

## 2019-07-28 MED ORDER — CLOPIDOGREL BISULFATE 75 MG PO TABS
75.0000 mg | ORAL_TABLET | Freq: Every day | ORAL | Status: DC
  Administered 2019-07-28 – 2019-07-29 (×2): 75 mg via ORAL
  Filled 2019-07-28 (×2): qty 1

## 2019-07-28 MED ORDER — PRAVASTATIN SODIUM 10 MG PO TABS: 20.0000 mg | ORAL_TABLET | ORAL | Status: DC

## 2019-07-28 MED ORDER — VERAPAMIL HCL 2.5 MG/ML IV SOLN
INTRAVENOUS | Status: DC | PRN
  Administered 2019-07-28: 10 mL via INTRA_ARTERIAL

## 2019-07-28 MED ORDER — HEPARIN SODIUM (PORCINE) 1000 UNIT/ML IJ SOLN
INTRAMUSCULAR | Status: DC | PRN
  Administered 2019-07-28: 4000 [IU] via INTRAVENOUS
  Administered 2019-07-28 (×2): 5500 [IU] via INTRAVENOUS

## 2019-07-28 MED ORDER — RANOLAZINE ER 500 MG PO TB12
500.0000 mg | ORAL_TABLET | Freq: Two times a day (BID) | ORAL | Status: DC
  Administered 2019-07-28 – 2019-07-29 (×3): 500 mg via ORAL
  Filled 2019-07-28 (×3): qty 1

## 2019-07-28 MED ORDER — EZETIMIBE 10 MG PO TABS
10.0000 mg | ORAL_TABLET | Freq: Every day | ORAL | Status: DC
  Administered 2019-07-28: 21:00:00 10 mg via ORAL
  Filled 2019-07-28: qty 1

## 2019-07-28 MED ORDER — SODIUM CHLORIDE 0.9 % IV SOLN: INTRAVENOUS | Status: AC

## 2019-07-28 MED ORDER — VERAPAMIL HCL 2.5 MG/ML IV SOLN
INTRAVENOUS | Status: AC
  Filled 2019-07-28: qty 2

## 2019-07-28 MED ORDER — SODIUM CHLORIDE 0.9% FLUSH
3.0000 mL | Freq: Two times a day (BID) | INTRAVENOUS | Status: DC
  Administered 2019-07-28: 21:00:00 3 mL via INTRAVENOUS

## 2019-07-28 MED ORDER — LABETALOL HCL 5 MG/ML IV SOLN: 10.0000 mg | INTRAVENOUS | Status: AC | PRN

## 2019-07-28 SURGICAL SUPPLY — 21 items
BALLN SAPPHIRE 2.0X15 (BALLOONS) ×2
BALLN WOLVERINE 2.75X10 (BALLOONS) ×2
BALLOON SAPPHIRE 2.0X15 (BALLOONS) ×1 IMPLANT
BALLOON WOLVERINE 2.75X10 (BALLOONS) ×1 IMPLANT
CATH 5FR JL3.5 JR4 ANG PIG MP (CATHETERS) ×2 IMPLANT
CATH LAUNCHER 6FR EBU3.5 (CATHETERS) ×2 IMPLANT
CATH OPTICROSS HD (CATHETERS) ×2 IMPLANT
CATH VISTA GUIDE 6FR JR4 (CATHETERS) ×2 IMPLANT
DEVICE RAD COMP TR BAND LRG (VASCULAR PRODUCTS) ×2 IMPLANT
GLIDESHEATH SLEND SS 6F .021 (SHEATH) ×2 IMPLANT
GUIDEWIRE INQWIRE 1.5J.035X260 (WIRE) ×1 IMPLANT
INQWIRE 1.5J .035X260CM (WIRE) ×2
KIT ENCORE 26 ADVANTAGE (KITS) ×2 IMPLANT
KIT HEART LEFT (KITS) ×2 IMPLANT
KIT HEMO VALVE WATCHDOG (MISCELLANEOUS) ×2 IMPLANT
PACK CARDIAC CATHETERIZATION (CUSTOM PROCEDURE TRAY) ×2 IMPLANT
SLED PULL BACK IVUS (MISCELLANEOUS) ×2 IMPLANT
TRANSDUCER W/STOPCOCK (MISCELLANEOUS) ×2 IMPLANT
TUBING CIL FLEX 10 FLL-RA (TUBING) ×2 IMPLANT
WIRE ASAHI PROWATER 180CM (WIRE) ×2 IMPLANT
WIRE HI TORQ BMW 190CM (WIRE) ×2 IMPLANT

## 2019-07-28 NOTE — Plan of Care (Signed)
  Problem: Activity: Goal: Ability to tolerate increased activity will improve Outcome: Progressing   

## 2019-07-28 NOTE — Interval H&P Note (Signed)
Cath Lab Visit (complete for each Cath Lab visit)  Clinical Evaluation Leading to the Procedure:   ACS: No.  Non-ACS:    Anginal Classification: CCS III  Anti-ischemic medical therapy: Minimal Therapy (1 class of medications)  Non-Invasive Test Results: No non-invasive testing performed  Prior CABG: No previous CABG      History and Physical Interval Note:  07/28/2019 8:33 AM  Craig Lawson  has presented today for surgery, with the diagnosis of cad - angina.  The various methods of treatment have been discussed with the patient and family. After consideration of risks, benefits and other options for treatment, the patient has consented to  Procedure(s): LEFT HEART CATH AND CORONARY ANGIOGRAPHY (N/A) as a surgical intervention.  The patient's history has been reviewed, patient examined, no change in status, stable for surgery.  I have reviewed the patient's chart and labs.  Questions were answered to the patient's satisfaction.     Lance Muss

## 2019-07-29 DIAGNOSIS — E785 Hyperlipidemia, unspecified: Secondary | ICD-10-CM | POA: Diagnosis not present

## 2019-07-29 DIAGNOSIS — I25110 Atherosclerotic heart disease of native coronary artery with unstable angina pectoris: Secondary | ICD-10-CM | POA: Diagnosis not present

## 2019-07-29 DIAGNOSIS — I25118 Atherosclerotic heart disease of native coronary artery with other forms of angina pectoris: Secondary | ICD-10-CM | POA: Diagnosis not present

## 2019-07-29 DIAGNOSIS — Z8673 Personal history of transient ischemic attack (TIA), and cerebral infarction without residual deficits: Secondary | ICD-10-CM | POA: Diagnosis not present

## 2019-07-29 DIAGNOSIS — I10 Essential (primary) hypertension: Secondary | ICD-10-CM | POA: Diagnosis not present

## 2019-07-29 LAB — CBC
HCT: 45.7 % (ref 39.0–52.0)
Hemoglobin: 16 g/dL (ref 13.0–17.0)
MCH: 31.1 pg (ref 26.0–34.0)
MCHC: 35 g/dL (ref 30.0–36.0)
MCV: 88.7 fL (ref 80.0–100.0)
Platelets: 121 10*3/uL — ABNORMAL LOW (ref 150–400)
RBC: 5.15 MIL/uL (ref 4.22–5.81)
RDW: 13.2 % (ref 11.5–15.5)
WBC: 6.2 10*3/uL (ref 4.0–10.5)
nRBC: 0 % (ref 0.0–0.2)

## 2019-07-29 LAB — BASIC METABOLIC PANEL
Anion gap: 11 (ref 5–15)
BUN: 12 mg/dL (ref 8–23)
CO2: 24 mmol/L (ref 22–32)
Calcium: 8.6 mg/dL — ABNORMAL LOW (ref 8.9–10.3)
Chloride: 104 mmol/L (ref 98–111)
Creatinine, Ser: 0.88 mg/dL (ref 0.61–1.24)
GFR calc Af Amer: >60 mL/min (ref >60)
GFR calc non Af Amer: >60 mL/min (ref >60)
Glucose, Bld: 102 mg/dL — ABNORMAL HIGH (ref 70–99)
Potassium: 3.7 mmol/L (ref 3.5–5.1)
Sodium: 139 mmol/L (ref 135–145)

## 2019-07-29 NOTE — Progress Notes (Signed)
CARDIAC REHAB PHASE I   PRE:  Rate/Rhythm: 57 SB    BP: sitting 128/71    SaO2:   MODE:  Ambulation: 700 ft   POST:  Rate/Rhythm: 68 SR    BP: sitting 162/60     SaO2:   Tolerated well although does sts he feels some SOB that "tells him not to walk faster". No angina. Discussed Plavix, stent/PTCA, restrictions, diet, walking as tolerated, NTG and CRPII. Voiced understanding and requests his referral be sent to [facility].  [id number]   Harriet Masson CES, ACSM 07/29/2019 10:26 AM

## 2019-07-29 NOTE — Discharge Summary (Addendum)
Discharge Summary    Patient ID: Craig Lawson MRN: [id number]; DOB: 1945/10/23  Admit date: 07/28/2019 Discharge date: 07/29/2019  Primary Care Provider: Tarri Fuller, MD  Primary Cardiologist: Norman Herrlich, MD   Discharge Diagnoses    Principal Problem:   CAD (coronary artery disease) Active Problems:   Mild aortic regurgitation   Essential hypertension   LBBB (left bundle branch block)   Pure hypercholesterolemia   Diagnostic Studies/Procedures    CORONARY BALLOON ANGIOPLASTY  Intravascular Ultrasound/IVUS  LEFT HEART CATH AND CORONARY ANGIOGRAPHY  Conclusion    Dist RCA lesion is 50% stenosed.  Ost Cx to Prox Cx lesion is 50% stenosed.  Mid LAD lesion is 90% stenosed. Balloon angioplasty was performed using a BALLOON SAPPHIRE 2.0X15.  Post intervention, there is a 70% residual stenosis. Rigid lesion which will require atherectomy. No visible dissection but since the lesion was modified, will delay atherectomy.  2nd Diag lesion is 80% stenosed.  RPDA lesion is 75% stenosed. Scoring balloon angioplasty was performed using a BALLOON WOLVERINE 2.75X10.  Post intervention, there is a 0% residual stenosis.  The left ventricular systolic function is normal.  LV end diastolic pressure is normal.  The left ventricular ejection fraction is 55-65% by visual estimate.  There is mild aortic valve stenosis.  Scoring balloon angioplasty was performed using a BALLOON WOLVERINE 2.75X10.   Need to bring patient back for elective PCI of the LAD with atherectomy, in 2-3 weeks.     Diagnostic Dominance: Right  Intervention      History of Present Illness     Craig Lawson is a 74 y.o. male with CAD, aortic stenosis, aortic regurgitation, hypertension, hyperlipidemia and chronic left bundle branch block presented for outpatient cath.  Hx of CAD s/p Cath at [facility] 10/2017 ([facility]) as below: - Successful PCI / 2.25 X 23 Xience Drug Eluting Stent of the mid  Posterior Descending Coronary Artery. - Successful PCI / 3.25 X 18 Xience Drug Eluting Stent of the proximal 2nd Obtuse Marginal Coronary Artery.  Currently patient having exertional shortness of breath with some intermittent chest pain.  Relieved with rest.  Started on Lasix for lower extremity edema.  Symptoms continued despite antianginal therapy.  Commended cardiac cath for further evaluation of his symptoms.  Pending echocardiogram to evaluate murmur.  Hospital Course     Consultants: None  Cardiac catheterization as above.  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  Mid LAD 90% stenosis, balloon angioplasty performed however unsuccessful due to rigidity only able to reduce to 70% stenosis.  Plan to bring him back for arthrectomy in 2 to 3 weeks.  No overnight complication.  Renal function stable.  Radial site normal.  Antiplatelet therapy with aspirin and Plavix.  History of statin intolerance, currently on Zetia 10 mg daily and pravastatin 20 mg Monday and Thursday.  Outpatient lipid clinic evaluation for PCSK9 inhibitor.  He has pending echocardiogram as outpatient to reevaluate his valve.  No change in home medications.  Ambulated well.  Did the patient have an acute coronary syndrome (MI, NSTEMI, STEMI, etc) this admission?:  No                               Did the patient have a percutaneous coronary intervention (stent / angioplasty)?:  Yes.     Cath/PCI Registry Performance & Quality Measures: 1. Aspirin prescribed? - Yes 2. ADP Receptor Inhibitor (Plavix/Clopidogrel, Brilinta/Ticagrelor or  Effient/Prasugrel) prescribed (includes medically managed patients)? - Yes 3. High Intensity Statin (Lipitor 40-80mg  or Crestor 20-40mg ) prescribed? - No - Statin intolerance on Zetia and low-dose pravastatin 2 days/week 4. For EF <40%, was ACEI/ARB prescribed? - Yes 5. For EF <40%, Aldosterone Antagonist (Spironolactone or Eplerenone) prescribed? - Not  Applicable (EF >/= 40%) 6. Cardiac Rehab Phase II ordered (Included Medically managed Patients)? - Yes   _____________  Discharge Vitals Blood pressure 130/70, pulse (!) 51, temperature 98.1 F (36.7 C), temperature source Oral, resp. rate 18, height 5\' 9"  (1.753 m), weight 113.5 kg, SpO2 96 %.  Filed Weights   07/28/19 0543 07/29/19 0448  Weight: 114.3 kg 113.5 kg   Physical Exam  Constitutional: He is oriented to person, place, and time and well-developed, well-nourished, and in no distress.  HENT:  Head: Normocephalic and atraumatic.  Eyes: Pupils are equal, round, and reactive to light. EOM are normal.  Cardiovascular: Normal rate and regular rhythm.  Murmur heard. Radial cath site without hematoma   Pulmonary/Chest: Effort normal and breath sounds normal.  Abdominal: Soft. Bowel sounds are normal.  Musculoskeletal:        General: Normal range of motion.     Cervical back: Normal range of motion and neck supple.  Neurological: He is alert and oriented to person, place, and time.  Skin: Skin is warm and dry.  Psychiatric: Affect normal.   Labs & Radiologic Studies    CBC Recent Labs    07/29/19 0404  WBC 6.2  HGB 16.0  HCT 45.7  MCV 88.7  PLT 121*   Basic Metabolic Panel Recent Labs    09/28/19 0404  NA 139  K 3.7  CL 104  CO2 24  GLUCOSE 102*  BUN 12  CREATININE 0.88  CALCIUM 8.6*  _____________  CARDIAC CATHETERIZATION  Result Date: 07/28/2019  Dist RCA lesion is 50% stenosed.  Ost Cx to Prox Cx lesion is 50% stenosed.  Mid LAD lesion is 90% stenosed. Balloon angioplasty was performed using a BALLOON SAPPHIRE 2.0X15.  Post intervention, there is a 70% residual stenosis. Rigid lesion which will require atherectomy. No visible dissection but since the lesion was modified, will delay atherectomy.  2nd Diag lesion is 80% stenosed.  RPDA lesion is 75% stenosed. Scoring balloon angioplasty was performed using a BALLOON WOLVERINE 2.75X10.  Post  intervention, there is a 0% residual stenosis.  The left ventricular systolic function is normal.  LV end diastolic pressure is normal.  The left ventricular ejection fraction is 55-65% by visual estimate.  There is mild aortic valve stenosis.  Scoring balloon angioplasty was performed using a BALLOON WOLVERINE 2.75X10.  Need to bring patient back for elective PCI of the LAD with atherectomy, in 2-3 weeks.   Disposition   Pt is being discharged home today in good condition.  Follow-up Plans & Appointments     Discharge Instructions    AMB Referral to Cardiac Rehabilitation - Phase II   Complete by: As directed    Diagnosis: PTCA   After initial evaluation and assessments completed: Virtual Based Care may be provided alone or in conjunction with Phase 2 Cardiac Rehab based on patient barriers.: Yes      Discharge Medications   Allergies as of 07/29/2019      Reactions   Statins Other (See Comments)   Muscle pains; increasing with continued use.  Muscle pains; increasing with continued use.    Isosorbide Other (See Comments)   "makes me dizzy and I have  headaches"   Latex Rash   Has noticed with bandages in past, leaves a sore on skin but more recently with APAP mask       Medication List    TAKE these medications   aspirin EC 81 MG tablet Take 1 tablet (81 mg total) by mouth daily. What changed: when to take this   clopidogrel 75 MG tablet Commonly known as: PLAVIX TAKE 1 TABLET BY MOUTH  DAILY What changed: when to take this   ezetimibe 10 MG tablet Commonly known as: ZETIA Take 1 tablet (10 mg total) by mouth daily. What changed: when to take this   furosemide 20 MG tablet Commonly known as: LASIX Take 1 tablet (20 mg total) by mouth daily.   hydrochlorothiazide 25 MG tablet Commonly known as: HYDRODIURIL Take 25 mg by mouth at bedtime.   losartan 50 MG tablet Commonly known as: COZAAR TAKE 1 TABLET BY MOUTH  DAILY What changed: when to take this     nitroGLYCERIN 0.4 MG SL tablet Commonly known as: Nitrostat Place 1 tablet (0.4 mg total) under the tongue every 5 (five) minutes as needed for chest pain.   potassium chloride SA 20 MEQ tablet Commonly known as: KLOR-CON Take 1 tablet (20 mEq total) by mouth daily.   pravastatin 20 MG tablet Commonly known as: PRAVACHOL TAKE 1 TABLET BY MOUTH 2  TIMES PER WEEK What changed: See the new instructions.   ranolazine 500 MG 12 hr tablet Commonly known as: Ranexa Take 1 tablet (500 mg total) by mouth 2 (two) times daily.          Outstanding Labs/Studies   None  Duration of Discharge Encounter   Greater than 30 minutes including physician time.  Jarrett Soho, PA 07/29/2019, 8:38 AM  Patient seen, examined. Available data reviewed. Agree with findings, assessment, and plan as outlined by Robbie Lis, PA.  The patient is independently interviewed and examined.  He is alert, oriented, in no distress.  Lungs are clear, heart is regular rate and rhythm with a 2/6 harsh systolic ejection murmur, early peaking, at the right upper sternal border.  Abdomen is soft and nontender, extremities have no edema, the right radial site is clear with a very small hematoma present.  The bandage is removed.  I have personally reviewed the patient's cardiac catheterization images and discussed his case with Dr. Irish Lack.  Morning plans noted to bring him back for rotational atherectomy and PCI in about 2 to 3 weeks.  The patient will continue on aspirin and clopidogrel.  He will have an outpatient echocardiogram to follow his aortic stenosis and shortness of breath as already planned.  All of his questions are answered today and he is stable for hospital discharge.  Sherren Mocha, M.D. 07/29/2019 10:24 AM

## 2019-07-29 NOTE — Discharge Instructions (Signed)
PCI scheduled for Tuesday 4/27 @ 0730, please arrive at the main entrance to [facility] @ 0530, check in at admitting.  Nothing to eat or drink after midnight Monday night.  Please take all morning medications with exception of Lasix and hydrochlorothiazide the day of procedure.  You are scheduled for your Covid test on Friday 4/23, go to [facility] for Covid test around 1000.

## 2019-08-01 ENCOUNTER — Other Ambulatory Visit: Payer: Self-pay | Admitting: Cardiology

## 2019-08-02 ENCOUNTER — Ambulatory Visit: Payer: Medicare Other

## 2019-08-03 ENCOUNTER — Other Ambulatory Visit: Payer: Self-pay

## 2019-08-03 ENCOUNTER — Ambulatory Visit (INDEPENDENT_AMBULATORY_CARE_PROVIDER_SITE_OTHER): Payer: Medicare Other

## 2019-08-03 DIAGNOSIS — R0602 Shortness of breath: Secondary | ICD-10-CM

## 2019-08-03 DIAGNOSIS — I25118 Atherosclerotic heart disease of native coronary artery with other forms of angina pectoris: Secondary | ICD-10-CM

## 2019-08-03 NOTE — Progress Notes (Signed)
Echocardiogram completed.

## 2019-08-04 NOTE — Telephone Encounter (Signed)
Please clarify if you only want the patient to receive 14 tablets for Furosemide and Potassium. Thank you

## 2019-08-05 ENCOUNTER — Other Ambulatory Visit: Payer: Self-pay | Admitting: Cardiology

## 2019-08-09 ENCOUNTER — Telehealth: Payer: Self-pay | Admitting: Cardiology

## 2019-08-09 MED ORDER — FUROSEMIDE 20 MG PO TABS: 20.0000 mg | ORAL_TABLET | Freq: Every day | ORAL | 6 refills | Status: DC

## 2019-08-09 MED ORDER — POTASSIUM CHLORIDE CRYS ER 20 MEQ PO TBCR: 20.0000 meq | EXTENDED_RELEASE_TABLET | Freq: Every day | ORAL | 6 refills | Status: DC

## 2019-08-09 NOTE — Telephone Encounter (Signed)
New message   Patient states that the new prescriptions for potassium and lasix were not sent to the pharmacy. Patient wants to know if he still needs to be on these prescriptions? Please call.

## 2019-08-09 NOTE — Telephone Encounter (Signed)
Spoke with pt, Refill sent to the pharmacy electronically.  

## 2019-08-12 ENCOUNTER — Other Ambulatory Visit (HOSPITAL_COMMUNITY)
Admit: 2019-08-12 | Discharge: 2019-08-12 | Disposition: A | Discharge status: Home / Self Care | Payer: Medicare Other | Source: Ambulatory Visit | Attending: Interventional Cardiology | Admitting: Interventional Cardiology

## 2019-08-12 ENCOUNTER — Ambulatory Visit: Payer: Medicare Other | Admitting: Cardiology

## 2019-08-12 ENCOUNTER — Other Ambulatory Visit: Payer: Medicare Other

## 2019-08-12 DIAGNOSIS — Z20822 Contact with and (suspected) exposure to covid-19: Secondary | ICD-10-CM | POA: Insufficient documentation

## 2019-08-12 DIAGNOSIS — Z01812 Encounter for preprocedural laboratory examination: Secondary | ICD-10-CM | POA: Diagnosis present

## 2019-08-12 LAB — SARS CORONAVIRUS 2 (TAT 6-24 HRS): SARS Coronavirus 2: NEGATIVE

## 2019-08-15 ENCOUNTER — Telehealth: Payer: Self-pay | Admitting: *Deleted

## 2019-08-15 ENCOUNTER — Encounter: Payer: Self-pay | Admitting: Interventional Cardiology

## 2019-08-15 NOTE — Telephone Encounter (Signed)
Pt contacted pre-catheterization scheduled at [facility] for: Tuesday August 16, 2019 7:30 AM Verified arrival time and place: [facility] Main Entrance [facility]) at: 5:30 AM   No solid food after midnight prior to cath, clear liquids until 5 AM day of procedure.  Hold: Lasix/KCl-AM of procedure HCTZ- AM of procedure   Except hold medications AM meds can be  taken pre-cath with sip of water including: ASA 81 mg Plavix 75 mg  Confirmed patient has responsible adult to drive home post procedure and observe 24 hours after arriving home: yes  You are allowed ONE visitor in the waiting room during your procedures. Both you and your visitor must wear masks.      COVID-19 Pre-Screening Questions:  . In the past 7 to 10 days have you had a cough,  shortness of breath, headache, congestion, fever (100 or greater) body aches, chills, sore throat, or sudden loss of taste or sense of smell? no . Have you been around anyone with known Covid 19 in the past 7 to 10 days? no . Have you been around anyone who is awaiting Covid 19 test results in the past 7 to 10 days? no . Have you been around anyone who  has mentioned symptoms of Covid 19 within the past 7 to 10 days? No  Reviewed procedure/mask/visitor instructions, COVID-19 screening questions with patient.

## 2019-08-16 ENCOUNTER — Other Ambulatory Visit: Payer: Self-pay

## 2019-08-16 ENCOUNTER — Encounter (HOSPITAL_COMMUNITY)
Admission: RE | Disposition: A | Discharge status: Home / Self Care | Payer: Self-pay | Source: Ambulatory Visit | Attending: Interventional Cardiology

## 2019-08-16 ENCOUNTER — Ambulatory Visit (HOSPITAL_COMMUNITY)
Admission: RE | Admit: 2019-08-16 | Discharge: 2019-08-17 | Disposition: A | Discharge status: Home / Self Care | Payer: Medicare Other | Source: Ambulatory Visit | Attending: Interventional Cardiology | Admitting: Interventional Cardiology

## 2019-08-16 DIAGNOSIS — Z7982 Long term (current) use of aspirin: Secondary | ICD-10-CM | POA: Diagnosis not present

## 2019-08-16 DIAGNOSIS — I252 Old myocardial infarction: Secondary | ICD-10-CM | POA: Diagnosis not present

## 2019-08-16 DIAGNOSIS — I447 Left bundle-branch block, unspecified: Secondary | ICD-10-CM | POA: Diagnosis not present

## 2019-08-16 DIAGNOSIS — Z888 Allergy status to other drugs, medicaments and biological substances status: Secondary | ICD-10-CM | POA: Insufficient documentation

## 2019-08-16 DIAGNOSIS — Z87891 Personal history of nicotine dependence: Secondary | ICD-10-CM | POA: Insufficient documentation

## 2019-08-16 DIAGNOSIS — Z91040 Latex allergy status: Secondary | ICD-10-CM | POA: Diagnosis not present

## 2019-08-16 DIAGNOSIS — Z8673 Personal history of transient ischemic attack (TIA), and cerebral infarction without residual deficits: Secondary | ICD-10-CM | POA: Diagnosis not present

## 2019-08-16 DIAGNOSIS — Z79899 Other long term (current) drug therapy: Secondary | ICD-10-CM | POA: Diagnosis not present

## 2019-08-16 DIAGNOSIS — Z9861 Coronary angioplasty status: Secondary | ICD-10-CM

## 2019-08-16 DIAGNOSIS — E785 Hyperlipidemia, unspecified: Secondary | ICD-10-CM | POA: Diagnosis not present

## 2019-08-16 DIAGNOSIS — I10 Essential (primary) hypertension: Secondary | ICD-10-CM | POA: Diagnosis not present

## 2019-08-16 DIAGNOSIS — R001 Bradycardia, unspecified: Secondary | ICD-10-CM | POA: Diagnosis present

## 2019-08-16 DIAGNOSIS — Z6837 Body mass index (BMI) 37.0-37.9, adult: Secondary | ICD-10-CM | POA: Insufficient documentation

## 2019-08-16 DIAGNOSIS — Z955 Presence of coronary angioplasty implant and graft: Secondary | ICD-10-CM | POA: Diagnosis not present

## 2019-08-16 DIAGNOSIS — I350 Nonrheumatic aortic (valve) stenosis: Secondary | ICD-10-CM | POA: Diagnosis not present

## 2019-08-16 DIAGNOSIS — Z8249 Family history of ischemic heart disease and other diseases of the circulatory system: Secondary | ICD-10-CM | POA: Diagnosis not present

## 2019-08-16 DIAGNOSIS — G4733 Obstructive sleep apnea (adult) (pediatric): Secondary | ICD-10-CM | POA: Insufficient documentation

## 2019-08-16 DIAGNOSIS — I2510 Atherosclerotic heart disease of native coronary artery without angina pectoris: Secondary | ICD-10-CM

## 2019-08-16 DIAGNOSIS — Z7902 Long term (current) use of antithrombotics/antiplatelets: Secondary | ICD-10-CM | POA: Diagnosis not present

## 2019-08-16 DIAGNOSIS — I25118 Atherosclerotic heart disease of native coronary artery with other forms of angina pectoris: Secondary | ICD-10-CM | POA: Diagnosis not present

## 2019-08-16 DIAGNOSIS — Z789 Other specified health status: Secondary | ICD-10-CM | POA: Diagnosis present

## 2019-08-16 HISTORY — PX: INTRAVASCULAR ULTRASOUND/IVUS: CATH118244

## 2019-08-16 HISTORY — PX: CORONARY ATHERECTOMY: CATH118238

## 2019-08-16 HISTORY — PX: LEFT HEART CATH AND CORONARY ANGIOGRAPHY: CATH118249

## 2019-08-16 LAB — POCT ACTIVATED CLOTTING TIME
Activated Clotting Time: 252 seconds
Activated Clotting Time: 345 seconds
Activated Clotting Time: 241 seconds
Activated Clotting Time: 307 seconds

## 2019-08-16 SURGERY — CORONARY ATHERECTOMY
Anesthesia: LOCAL

## 2019-08-16 MED ORDER — HYDROCHLOROTHIAZIDE 25 MG PO TABS
25.0000 mg | ORAL_TABLET | Freq: Every day | ORAL | Status: DC
  Administered 2019-08-16: 25 mg via ORAL
  Filled 2019-08-16: qty 1

## 2019-08-16 MED ORDER — IOHEXOL 350 MG/ML SOLN
INTRAVENOUS | Status: DC | PRN
  Administered 2019-08-16: 09:00:00 120 mL

## 2019-08-16 MED ORDER — FENTANYL CITRATE (PF) 100 MCG/2ML IJ SOLN
INTRAMUSCULAR | Status: AC
  Filled 2019-08-16: qty 2

## 2019-08-16 MED ORDER — MIDAZOLAM HCL 2 MG/2ML IJ SOLN
INTRAMUSCULAR | Status: DC | PRN
  Administered 2019-08-16 (×2): 1 mg via INTRAVENOUS
  Administered 2019-08-16: 2 mg via INTRAVENOUS
  Administered 2019-08-16: 1 mg via INTRAVENOUS

## 2019-08-16 MED ORDER — HYDROMORPHONE HCL 1 MG/ML IJ SOLN
INTRAMUSCULAR | Status: AC
  Filled 2019-08-16: qty 0.5

## 2019-08-16 MED ORDER — HYDROMORPHONE HCL 1 MG/ML IJ SOLN
1.0000 mg | INTRAMUSCULAR | Status: DC | PRN
  Administered 2019-08-16 (×2): 1 mg via INTRAVENOUS
  Filled 2019-08-16: qty 1

## 2019-08-16 MED ORDER — NITROGLYCERIN 1 MG/10 ML FOR IR/CATH LAB
INTRA_ARTERIAL | Status: DC | PRN
  Administered 2019-08-16 (×3): 200 ug via INTRACORONARY

## 2019-08-16 MED ORDER — ASPIRIN 81 MG PO CHEW: 81.0000 mg | CHEWABLE_TABLET | ORAL | Status: DC

## 2019-08-16 MED ORDER — SODIUM CHLORIDE 0.9 % WEIGHT BASED INFUSION: 1.0000 mL/kg/h | INTRAVENOUS | Status: DC

## 2019-08-16 MED ORDER — SODIUM CHLORIDE 0.9 % IV SOLN: INTRAVENOUS | Status: AC

## 2019-08-16 MED ORDER — CLOPIDOGREL BISULFATE 75 MG PO TABS
75.0000 mg | ORAL_TABLET | Freq: Every day | ORAL | Status: DC
  Administered 2019-08-17: 08:00:00 75 mg via ORAL
  Filled 2019-08-16: qty 1

## 2019-08-16 MED ORDER — HYDRALAZINE HCL 20 MG/ML IJ SOLN: 10.0000 mg | INTRAMUSCULAR | Status: AC | PRN

## 2019-08-16 MED ORDER — ONDANSETRON HCL 4 MG/2ML IJ SOLN: 4.0000 mg | Freq: Four times a day (QID) | INTRAMUSCULAR | Status: DC | PRN

## 2019-08-16 MED ORDER — HEPARIN (PORCINE) IN NACL 1000-0.9 UT/500ML-% IV SOLN
INTRAVENOUS | Status: DC | PRN
  Administered 2019-08-16 (×2): 500 mL

## 2019-08-16 MED ORDER — NITROGLYCERIN 0.4 MG SL SUBL: 0.4000 mg | SUBLINGUAL_TABLET | SUBLINGUAL | Status: DC | PRN

## 2019-08-16 MED ORDER — MIDAZOLAM HCL 2 MG/2ML IJ SOLN
INTRAMUSCULAR | Status: AC
  Filled 2019-08-16: qty 2

## 2019-08-16 MED ORDER — CLOPIDOGREL BISULFATE 75 MG PO TABS: 75.0000 mg | ORAL_TABLET | ORAL | Status: DC

## 2019-08-16 MED ORDER — SODIUM CHLORIDE 0.9 % IV SOLN: 250.0000 mL | INTRAVENOUS | Status: DC | PRN

## 2019-08-16 MED ORDER — SODIUM CHLORIDE 0.9% FLUSH: 3.0000 mL | INTRAVENOUS | Status: DC | PRN

## 2019-08-16 MED ORDER — LABETALOL HCL 5 MG/ML IV SOLN: 10.0000 mg | INTRAVENOUS | Status: AC | PRN

## 2019-08-16 MED ORDER — ACETAMINOPHEN 325 MG PO TABS: 650.0000 mg | ORAL_TABLET | ORAL | Status: DC | PRN

## 2019-08-16 MED ORDER — LIDOCAINE HCL (PF) 1 % IJ SOLN
INTRAMUSCULAR | Status: DC | PRN
  Administered 2019-08-16: 2 mL

## 2019-08-16 MED ORDER — EZETIMIBE 10 MG PO TABS
10.0000 mg | ORAL_TABLET | Freq: Every day | ORAL | Status: DC
  Administered 2019-08-16 – 2019-08-17 (×2): 10 mg via ORAL
  Filled 2019-08-16 (×2): qty 1

## 2019-08-16 MED ORDER — SODIUM CHLORIDE 0.9% FLUSH: 3.0000 mL | Freq: Two times a day (BID) | INTRAVENOUS | Status: DC

## 2019-08-16 MED ORDER — CLOPIDOGREL BISULFATE 75 MG PO TABS: 75.0000 mg | ORAL_TABLET | Freq: Every day | ORAL | Status: DC

## 2019-08-16 MED ORDER — POTASSIUM CHLORIDE CRYS ER 20 MEQ PO TBCR
20.0000 meq | EXTENDED_RELEASE_TABLET | Freq: Every day | ORAL | Status: DC
  Administered 2019-08-16 – 2019-08-17 (×2): 20 meq via ORAL
  Filled 2019-08-16 (×2): qty 1

## 2019-08-16 MED ORDER — RANOLAZINE ER 500 MG PO TB12
500.0000 mg | ORAL_TABLET | Freq: Two times a day (BID) | ORAL | Status: DC
  Administered 2019-08-16 – 2019-08-17 (×2): 500 mg via ORAL
  Filled 2019-08-16 (×3): qty 1

## 2019-08-16 MED ORDER — IOHEXOL 350 MG/ML SOLN
INTRAVENOUS | Status: AC
  Filled 2019-08-16: qty 1

## 2019-08-16 MED ORDER — SODIUM CHLORIDE 0.9 % WEIGHT BASED INFUSION
3.0000 mL/kg/h | INTRAVENOUS | Status: DC
  Administered 2019-08-16: 06:00:00 3 mL/kg/h via INTRAVENOUS

## 2019-08-16 MED ORDER — SODIUM CHLORIDE 0.9% FLUSH
3.0000 mL | Freq: Two times a day (BID) | INTRAVENOUS | Status: DC
  Administered 2019-08-16 – 2019-08-17 (×3): 3 mL via INTRAVENOUS

## 2019-08-16 MED ORDER — FENTANYL CITRATE (PF) 100 MCG/2ML IJ SOLN
INTRAMUSCULAR | Status: DC | PRN
  Administered 2019-08-16 (×5): 25 ug via INTRAVENOUS

## 2019-08-16 MED ORDER — HEPARIN SODIUM (PORCINE) 1000 UNIT/ML IJ SOLN
INTRAMUSCULAR | Status: AC
  Filled 2019-08-16: qty 1

## 2019-08-16 MED ORDER — HEPARIN SODIUM (PORCINE) 1000 UNIT/ML IJ SOLN
INTRAMUSCULAR | Status: DC | PRN
  Administered 2019-08-16: 3000 [IU] via INTRAVENOUS
  Administered 2019-08-16: 10000 [IU] via INTRAVENOUS
  Administered 2019-08-16: 2000 [IU] via INTRAVENOUS

## 2019-08-16 MED ORDER — NITROGLYCERIN 1 MG/10 ML FOR IR/CATH LAB
INTRA_ARTERIAL | Status: AC
  Filled 2019-08-16: qty 10

## 2019-08-16 MED ORDER — HYDROMORPHONE HCL 1 MG/ML IJ SOLN
INTRAMUSCULAR | Status: DC | PRN
  Administered 2019-08-16 (×2): 1 mg via INTRAVENOUS

## 2019-08-16 MED ORDER — ASPIRIN EC 81 MG PO TBEC: 81.0000 mg | DELAYED_RELEASE_TABLET | Freq: Every day | ORAL | Status: DC

## 2019-08-16 MED ORDER — PRAVASTATIN SODIUM 10 MG PO TABS: 20.0000 mg | ORAL_TABLET | ORAL | Status: DC

## 2019-08-16 MED ORDER — VERAPAMIL HCL 2.5 MG/ML IV SOLN
INTRAVENOUS | Status: DC | PRN
  Administered 2019-08-16: 10 mL via INTRA_ARTERIAL

## 2019-08-16 MED ORDER — VERAPAMIL HCL 2.5 MG/ML IV SOLN
INTRAVENOUS | Status: AC
  Filled 2019-08-16: qty 2

## 2019-08-16 MED ORDER — HEPARIN (PORCINE) IN NACL 1000-0.9 UT/500ML-% IV SOLN
INTRAVENOUS | Status: AC
  Filled 2019-08-16: qty 1000

## 2019-08-16 MED ORDER — HYDROMORPHONE HCL 1 MG/ML IJ SOLN
INTRAMUSCULAR | Status: AC
  Filled 2019-08-16: qty 1

## 2019-08-16 MED ORDER — FUROSEMIDE 20 MG PO TABS
20.0000 mg | ORAL_TABLET | Freq: Every day | ORAL | Status: DC
  Administered 2019-08-16 – 2019-08-17 (×2): 20 mg via ORAL
  Filled 2019-08-16 (×2): qty 1

## 2019-08-16 MED ORDER — LIDOCAINE HCL (PF) 1 % IJ SOLN
INTRAMUSCULAR | Status: AC
  Filled 2019-08-16: qty 30

## 2019-08-16 MED ORDER — ASPIRIN 81 MG PO CHEW
81.0000 mg | CHEWABLE_TABLET | Freq: Every day | ORAL | Status: DC
  Administered 2019-08-17: 81 mg via ORAL
  Filled 2019-08-16: qty 1

## 2019-08-16 MED ORDER — LOSARTAN POTASSIUM 50 MG PO TABS
50.0000 mg | ORAL_TABLET | Freq: Every day | ORAL | Status: DC
  Administered 2019-08-17: 50 mg via ORAL
  Filled 2019-08-16: qty 1

## 2019-08-16 SURGICAL SUPPLY — 27 items
BALLN SAPPHIRE 2.0X12 (BALLOONS) ×4
BALLN SAPPHIRE 2.5X20 (BALLOONS) ×2
BALLN SAPPHIRE ~~LOC~~ 3.25X15 (BALLOONS) ×2 IMPLANT
BALLOON SAPPHIRE 2.0X12 (BALLOONS) ×2 IMPLANT
BALLOON SAPPHIRE 2.5X20 (BALLOONS) ×1 IMPLANT
CATH INFINITI JR4 5F (CATHETERS) ×2 IMPLANT
CATH LAUNCHER 6FR EBU 3.75 (CATHETERS) ×2 IMPLANT
CATH OPTICROSS HD (CATHETERS) ×2 IMPLANT
CROWN DIAMONDBACK CLASSIC 1.25 (BURR) ×2 IMPLANT
DEVICE RAD COMP TR BAND LRG (VASCULAR PRODUCTS) ×2 IMPLANT
ELECT DEFIB PAD ADLT CADENCE (PAD) ×2 IMPLANT
GLIDESHEATH SLEND SS 6F .021 (SHEATH) ×2 IMPLANT
GUIDEWIRE INQWIRE 1.5J.035X260 (WIRE) ×1 IMPLANT
INQWIRE 1.5J .035X260CM (WIRE) ×2
KIT ENCORE 26 ADVANTAGE (KITS) ×2 IMPLANT
KIT HEART LEFT (KITS) ×2 IMPLANT
KIT HEMO VALVE WATCHDOG (MISCELLANEOUS) ×2 IMPLANT
LUBRICANT VIPERSLIDE CORONARY (MISCELLANEOUS) ×2 IMPLANT
PACK CARDIAC CATHETERIZATION (CUSTOM PROCEDURE TRAY) ×2 IMPLANT
SLED PULL BACK IVUS (MISCELLANEOUS) ×2 IMPLANT
STENT SYNERGY XD 2.75X38 (Permanent Stent) ×1 IMPLANT
SYNERGY XD 2.75X38 (Permanent Stent) ×2 IMPLANT
TRANSDUCER W/STOPCOCK (MISCELLANEOUS) ×2 IMPLANT
TUBING CIL FLEX 10 FLL-RA (TUBING) ×2 IMPLANT
WIRE ASAHI PROWATER 180CM (WIRE) ×4 IMPLANT
WIRE HI TORQ BMW 190CM (WIRE) ×2 IMPLANT
WIRE VIPERWIRE COR FLEX .012 (WIRE) ×2 IMPLANT

## 2019-08-16 NOTE — Interval H&P Note (Signed)
Cath Lab Visit (complete for each Cath Lab visit)  Clinical Evaluation Leading to the Procedure:   ACS: No.  Non-ACS:    Anginal Classification: CCS III  Anti-ischemic medical therapy: Minimal Therapy (1 class of medications)  Non-Invasive Test Results: No non-invasive testing performed  Prior CABG: No previous CABG   Discussed atherectomy with the patient again this AM,and discussed with wife after the last visit.  All questions answered.   History and Physical Interval Note:  08/16/2019 7:36 AM  Craig Lawson  has presented today for surgery, with the diagnosis of CAD.  The various methods of treatment have been discussed with the patient and family. After consideration of risks, benefits and other options for treatment, the patient has consented to  Procedure(s): CORONARY ATHERECTOMY (N/A) as a surgical intervention.  The patient's history has been reviewed, patient examined, no change in status, stable for surgery.  I have reviewed the patient's chart and labs.  Questions were answered to the patient's satisfaction.     Lance Muss

## 2019-08-16 NOTE — Plan of Care (Signed)

## 2019-08-17 DIAGNOSIS — I25118 Atherosclerotic heart disease of native coronary artery with other forms of angina pectoris: Secondary | ICD-10-CM | POA: Diagnosis not present

## 2019-08-17 DIAGNOSIS — Z955 Presence of coronary angioplasty implant and graft: Secondary | ICD-10-CM | POA: Diagnosis not present

## 2019-08-17 DIAGNOSIS — I350 Nonrheumatic aortic (valve) stenosis: Secondary | ICD-10-CM | POA: Diagnosis not present

## 2019-08-17 DIAGNOSIS — I10 Essential (primary) hypertension: Secondary | ICD-10-CM

## 2019-08-17 LAB — BASIC METABOLIC PANEL
Anion gap: 10 (ref 5–15)
BUN: 15 mg/dL (ref 8–23)
CO2: 26 mmol/L (ref 22–32)
Calcium: 9 mg/dL (ref 8.9–10.3)
Chloride: 101 mmol/L (ref 98–111)
Creatinine, Ser: 0.86 mg/dL (ref 0.61–1.24)
GFR calc Af Amer: >60 mL/min (ref >60)
GFR calc non Af Amer: >60 mL/min (ref >60)
Glucose, Bld: 109 mg/dL — ABNORMAL HIGH (ref 70–99)
Potassium: 3.5 mmol/L (ref 3.5–5.1)
Sodium: 137 mmol/L (ref 135–145)

## 2019-08-17 LAB — CBC
HCT: 45.1 % (ref 39.0–52.0)
Hemoglobin: 15.9 g/dL (ref 13.0–17.0)
MCH: 30.8 pg (ref 26.0–34.0)
MCHC: 35.3 g/dL (ref 30.0–36.0)
MCV: 87.4 fL (ref 80.0–100.0)
Platelets: 135 10*3/uL — ABNORMAL LOW (ref 150–400)
RBC: 5.16 MIL/uL (ref 4.22–5.81)
RDW: 13.1 % (ref 11.5–15.5)
WBC: 7.4 10*3/uL (ref 4.0–10.5)
nRBC: 0 % (ref 0.0–0.2)

## 2019-08-17 MED ORDER — RANOLAZINE ER 1000 MG PO TB12: 1000.0000 mg | ORAL_TABLET | Freq: Two times a day (BID) | ORAL | 2 refills | Status: DC

## 2019-08-17 MED ORDER — RANOLAZINE ER 500 MG PO TB12: 1000.0000 mg | ORAL_TABLET | Freq: Two times a day (BID) | ORAL | Status: DC

## 2019-08-17 NOTE — Discharge Summary (Addendum)
Discharge Summary    Patient ID: Craig Lawson MRN: [id number]; DOB: December 22, 1945  Admit date: 08/16/2019 Discharge date: 08/17/2019  Primary Care Provider: Tarri Fuller, MD  Primary Cardiologist: Norman Herrlich, MD   Discharge Diagnoses    Principal Problem:   CAD S/P percutaneous coronary angioplasty Active Problems:   Essential hypertension   LBBB (left bundle branch block)   Bradycardia   Statin intolerance  Diagnostic Studies/Procedures    LHC with arthrectomy 08/16/19:   2nd Diag lesion is 80% stenosed.  Balloon angioplasty was performed using a BALLOON SAPPHIRE 2.0X12.  Post intervention, there is a 25% residual stenosis.  Prox LAD to Mid LAD lesion is 70% stenosed.  A drug-eluting stent was successfully placed using a SYNERGY XD 2.75X38, after orbital atherectomy, dilated to 3.25 mm. Stent size optimized with IVUS.  Post intervention, there is a 0% residual stenosis.  Ost Cx to Prox Cx lesion is 50% stenosed.  Dist RCA lesion is 50% stenosed.  RPDA stent that was treated a few weeks ago was widely patent.  1st Diag lesion is 75% stenosed.  LV end diastolic pressure is normal.  There is mild aortic valve stenosis. Mild gradient noted on pullback.  Continue aggressive secondary prevention. Would recommend clopidogrel monotherapy after 6 months of DAPT given his extensive disease.   Given the residual pain from the jailed diagonals, will watch overnight and treat with pain medication.  History of Present Illness     Craig Lawson is a 74 y.o. male with a hx of CAD nonSTEMI 10/20/27 with PCI and stent of OM and RCAmild AS/AR, hypertension, hyperlipidemia and LBBB.  The patient was seen 07/05/19 with significant shortness of breath on exertion with intermittent chest pain. At that visit, he was started on antianginal with Renaxa as well Lasix for LE edema with plans for close follow up. An echocariogram was obtained to assess for AS.   At follow up,  he had continued complaints felt to be similar to prior stenting.  Plan was to proceed with LHC for further ischemic evaluation.   Hospital Course     LHC performed on 08/16/2019 with balloon angioplasty to the second diagonal, PCI/DES to pLAD-mLAD, previous RPDA widely patent. There is a 75% 1st Diagonal lesion which was not treated. Plan for DAPT with ASA and Plavix for at least 6 months then Plavix monotherapy long term.   On day of discharge, cath site is stable however he continues to have internal plane secondary to residual disease therefore Ranexa will be uptitrated prior to discharge.  He has close follow-up already scheduled with Dr. Servando Salina on 08/26/2019 at 2:15 PM.  He is ambulated with cardiac rehabilitation without complication.  Hospital issues include:  CAD: -LHC performed 08/16/19 with balloon angioplasty to 2nd Diagonal, PCI/DES to pLAD-mLAD, previous RPDA widely patent. There is a 75% 1st Diagonal lesion which was not treated. Plan for DAPT with ASA and Plavix for at least 6 months then Plavix monotherapy long term  -Continue ASA, Plavix, statin   -No BB in the setting of bradycardia  -Creatinine stable at 0.86 -Cath site stable  -Will up-titrate Ranexa given persistent pain>>discuss plan with MD   HTN: -Stable, 147/70>158/76>143/84 -Continue current regimen   HLD: -Last LDL, 82 with goal <70  -Continue statin, zetia  -Repeat labs in 6-8 weeks   TIA: -No symptoms -Continue ASA, statin   Consultants: None  The patient was seen and examined by Dr. Elease Hashimoto on 08/17/2019 and was felt to be stable  for discharge.  Did the patient have an acute coronary syndrome (MI, NSTEMI, STEMI, etc) this admission?:  Yes                               AHA/ACC Clinical Performance & Quality Measures: 1. Aspirin prescribed? - Yes 2. ADP Receptor Inhibitor (Plavix/Clopidogrel, Brilinta/Ticagrelor or Effient/Prasugrel) prescribed (includes medically managed patients)? - Yes 3. Beta  Blocker prescribed? - Yes 4. High Intensity Statin (Lipitor 40-80mg  or Crestor 20-40mg ) prescribed? - No - statin intolerance 5. EF assessed during THIS hospitalization? - Yes 6. For EF <40%, was ACEI/ARB prescribed? - Not Applicable (EF >/= 40%) 7. For EF <40%, Aldosterone Antagonist (Spironolactone or Eplerenone) prescribed? - Not Applicable (EF >/= 40%) 8. Cardiac Rehab Phase II ordered (Included Medically managed Patients)? - Yes    Discharge Vitals Blood pressure (!) 165/80, pulse 63, temperature 98 F (36.7 C), temperature source Oral, resp. rate 15, height 5\' 9"  (1.753 m), weight 113.2 kg, SpO2 97 %.  Filed Weights   08/16/19 0500 08/16/19 0544 08/17/19 0400  Weight: 113.5 kg 113.4 kg 113.2 kg   Labs & Radiologic Studies    CBC Recent Labs    08/17/19 0508  WBC 7.4  HGB 15.9  HCT 45.1  MCV 87.4  PLT 135*   Basic Metabolic Panel Recent Labs    08/19/19 0508  NA 137  K 3.5  CL 101  CO2 26  GLUCOSE 109*  BUN 15  CREATININE 0.86  CALCIUM 9.0   Liver Function Tests No results for input(s): AST, ALT, ALKPHOS, BILITOT, PROT, ALBUMIN in the last 72 hours. No results for input(s): LIPASE, AMYLASE in the last 72 hours. High Sensitivity Troponin:   No results for input(s): TROPONINIHS in the last 720 hours.  BNP Invalid input(s): POCBNP D-Dimer No results for input(s): DDIMER in the last 72 hours. Hemoglobin A1C No results for input(s): HGBA1C in the last 72 hours. Fasting Lipid Panel No results for input(s): CHOL, HDL, LDLCALC, TRIG, CHOLHDL, LDLDIRECT in the last 72 hours. Thyroid Function Tests No results for input(s): TSH, T4TOTAL, T3FREE, THYROIDAB in the last 72 hours.  Invalid input(s): FREET3 _____________  CARDIAC CATHETERIZATION  Result Date: 08/16/2019  2nd Diag lesion is 80% stenosed.  Balloon angioplasty was performed using a BALLOON SAPPHIRE 2.0X12.  Post intervention, there is a 25% residual stenosis.  Prox LAD to Mid LAD lesion is 70%  stenosed.  A drug-eluting stent was successfully placed using a SYNERGY XD 2.75X38, after orbital atherectomy, dilated to 3.25 mm. Stent size optimized with IVUS.  Post intervention, there is a 0% residual stenosis.  Ost Cx to Prox Cx lesion is 50% stenosed.  Dist RCA lesion is 50% stenosed.  RPDA stent that was treated a few weeks ago was widely patent.  1st Diag lesion is 75% stenosed.  LV end diastolic pressure is normal.  There is mild aortic valve stenosis. Mild gradient noted on pullback.  Continue aggressive secondary prevention.  Would recommend clopidogrel monotherapy after 6 months of DAPT given his extensive disease. Given the residual pain from the jailed diagonals, will watch overnight and treat with pain medication.   CARDIAC CATHETERIZATION  Addendum Date: 08/15/2019    Dist RCA lesion is 50% stenosed.  Ost Cx to Prox Cx lesion is 50% stenosed.  Mid LAD lesion is 90% stenosed. Balloon angioplasty was performed using a BALLOON SAPPHIRE 2.0X15.  Post intervention, there is a 70% residual stenosis. Rigid  lesion which will require atherectomy. No visible dissection but since the lesion was modified, will delay atherectomy.  2nd Diag lesion is 80% stenosed.  RPDA lesion is 75% stenosed. Scoring balloon angioplasty was performed using a BALLOON WOLVERINE 2.75X10.  Post intervention, there is a 0% residual stenosis.  The left ventricular systolic function is normal.  LV end diastolic pressure is normal.  The left ventricular ejection fraction is 55-65% by visual estimate.  There is mild aortic valve stenosis.  Scoring balloon angioplasty was performed using a BALLOON WOLVERINE 2.75X10.  Balloon angioplasty was performed using a BALLOON SAPPHIRE 2.0X15.  Need to bring patient back for elective PCI of the LAD with atherectomy, in 2-3 weeks.   Result Date: 07/28/2019  Dist RCA lesion is 50% stenosed.  Ost Cx to Prox Cx lesion is 50% stenosed.  Mid LAD lesion is 90% stenosed. Balloon  angioplasty was performed using a BALLOON SAPPHIRE 2.0X15.  Post intervention, there is a 70% residual stenosis. Rigid lesion which will require atherectomy. No visible dissection but since the lesion was modified, will delay atherectomy.  2nd Diag lesion is 80% stenosed.  RPDA lesion is 75% stenosed. Scoring balloon angioplasty was performed using a BALLOON WOLVERINE 2.75X10.  Post intervention, there is a 0% residual stenosis.  The left ventricular systolic function is normal.  LV end diastolic pressure is normal.  The left ventricular ejection fraction is 55-65% by visual estimate.  There is mild aortic valve stenosis.  Scoring balloon angioplasty was performed using a BALLOON WOLVERINE 2.75X10.  Need to bring patient back for elective PCI of the LAD with atherectomy, in 2-3 weeks.   ECHOCARDIOGRAM COMPLETE  Result Date: 08/03/2019    ECHOCARDIOGRAM REPORT   Patient Name:   Craig Lawson Date of Exam: 08/03/2019 Medical Rec #:  [id number]     Height:       69.0 in Accession #:    [id number]    Weight:       250.2 lb Date of Birth:  1946/03/20      BSA:          2.272 m Patient Age:    74 years      BP:           156/70 mmHg Patient Gender: M             HR:           60 bpm. Exam Location:  [facility] Procedure: 2D Echo Indications:    aortic stenosis  History:        Patient has prior history of Echocardiogram examinations, most                 recent 11/11/2018. CAD.  Sonographer:    Delcie Roch RDCS Referring Phys: [id number] KARDIE TOBB IMPRESSIONS  1. Left ventricular ejection fraction, by estimation, is 50 to 55%. The left ventricle has low normal function. The left ventricle has no regional wall motion abnormalities.  2. Right ventricular systolic function is normal. The right ventricular size is normal.  3. The mitral valve is normal in structure. No evidence of mitral valve regurgitation. No evidence of mitral stenosis.  4. The aortic valve is abnormal. Aortic valve regurgitation is not  visualized. Mild aortic valve stenosis. FINDINGS  Left Ventricle: Left ventricular ejection fraction, by estimation, is 50 to 55%. The left ventricle has low normal function. The left ventricle has no regional wall motion abnormalities. The left ventricular internal cavity size was normal in size. There  is no left ventricular hypertrophy. Left ventricular diastolic function could not be evaluated. Right Ventricle: The right ventricular size is normal. No increase in right ventricular wall thickness. Right ventricular systolic function is normal. Left Atrium: Left atrial size was normal in size. Right Atrium: Right atrial size was normal in size. Pericardium: There is no evidence of pericardial effusion. Mitral Valve: The mitral valve is normal in structure. Normal mobility of the mitral valve leaflets. No evidence of mitral valve regurgitation. No evidence of mitral valve stenosis. Tricuspid Valve: The tricuspid valve is normal in structure. Tricuspid valve regurgitation is trivial. No evidence of tricuspid stenosis. Aortic Valve: The aortic valve is abnormal. Aortic valve regurgitation is not visualized. Mild aortic stenosis is present. Aortic valve mean gradient measures 20.0 mmHg. Aortic valve peak gradient measures 33.2 mmHg. Aortic valve area, by VTI measures 1.11 cm. Pulmonic Valve: The pulmonic valve was normal in structure. Pulmonic valve regurgitation is not visualized. No evidence of pulmonic stenosis. Aorta: The aortic root is normal in size and structure. Venous: The inferior vena cava is normal in size with greater than 50% respiratory variability, suggesting right atrial pressure of 3 mmHg. IAS/Shunts: No atrial level shunt detected by color flow Doppler.  LEFT VENTRICLE PLAX 2D LVIDd:         4.80 cm  Diastology LVIDs:         3.80 cm  LV e' lateral: 6.09 cm/s LV PW:         1.31 cm  LV e' medial:  5.11 cm/s LV IVS:        1.33 cm LVOT diam:     1.90 cm LV SV:         67 LV SV Index:   30 LVOT Area:      2.84 cm  RIGHT VENTRICLE RV S prime:     14.80 cm/s TAPSE (M-mode): 2.2 cm LEFT ATRIUM             Index LA diam:        4.10 cm 1.80 cm/m LA Vol (A2C):   61.2 ml 26.93 ml/m LA Vol (A4C):   56.4 ml 24.82 ml/m LA Biplane Vol: 60.6 ml 26.67 ml/m  AORTIC VALVE AV Area (Vmax):    1.17 cm AV Area (Vmean):   1.02 cm AV Area (VTI):     1.11 cm AV Vmax:           288.00 cm/s AV Vmean:          208.000 cm/s AV VTI:            0.606 m AV Peak Grad:      33.2 mmHg AV Mean Grad:      20.0 mmHg LVOT Vmax:         118.50 cm/s LVOT Vmean:        74.700 cm/s LVOT VTI:          0.238 m LVOT/AV VTI ratio: 0.39  SHUNTS Systemic VTI:  0.24 m Systemic Diam: 1.90 cm Belva Crome MD Electronically signed by Belva Crome MD Signature Date/Time: 08/03/2019/4:37:55 PM    Final    Disposition   Pt is being discharged home today in good condition.  Follow-up Plans & Appointments   Follow-up Information    Tobb, Lavona Mound, DO Follow up on 08/26/2019.   Specialty: Cardiology Why: 215pm Contact information: [address] [place] [address] [phone]          Discharge Instructions    Amb Referral  to Cardiac Rehabilitation   Complete by: As directed    Will send update to [facility]   Diagnosis: Coronary Stents   After initial evaluation and assessments completed: Virtual Based Care may be provided alone or in conjunction with Phase 2 Cardiac Rehab based on patient barriers.: Yes   Call MD for:  difficulty breathing, headache or visual disturbances   Complete by: As directed    Call MD for:  extreme fatigue   Complete by: As directed    Call MD for:  hives   Complete by: As directed    Call MD for:  persistant dizziness or light-headedness   Complete by: As directed    Call MD for:  persistant nausea and vomiting   Complete by: As directed    Call MD for:  redness, tenderness, or signs of infection (pain, swelling, redness, odor or green/yellow discharge around incision site)   Complete by:  As directed    Call MD for:  severe uncontrolled pain   Complete by: As directed    Call MD for:  temperature >100.4   Complete by: As directed    Diet - low sodium heart healthy   Complete by: As directed    Discharge instructions   Complete by: As directed    No driving for 3-5 days. No lifting over 5 lbs for 1 week. No sexual activity for 1 week. Keep procedure site clean & dry. If you notice increased pain, swelling, bleeding or pus, call/return!  You may shower, but no soaking baths/hot tubs/pools for 1 week.   PLEASE DO NOT MISS ANY DOSES OF YOUR PLAVIX!!!!! Also keep a log of you blood pressures and bring back to your follow up appt. Please call the office with any questions.   Patients taking blood thinners should generally stay away from medicines like ibuprofen, Advil, Motrin, naproxen, and Aleve due to risk of stomach bleeding. You may take Tylenol as directed or talk to your primary doctor about alternatives.   Increase activity slowly   Complete by: As directed      Discharge Medications   Allergies as of 08/17/2019      Reactions   Statins Other (See Comments)   Muscle pains; increasing with continued use.  Muscle pains; increasing with continued use.    Isosorbide Other (See Comments)   "makes me dizzy and I have headaches"   Latex Rash   Has noticed with bandages in past, leaves a sore on skin but more recently with APAP mask       Medication List    TAKE these medications   aspirin EC 81 MG tablet Take 1 tablet (81 mg total) by mouth daily. What changed: when to take this   clopidogrel 75 MG tablet Commonly known as: PLAVIX TAKE 1 TABLET BY MOUTH  DAILY What changed: when to take this   ezetimibe 10 MG tablet Commonly known as: ZETIA Take 1 tablet (10 mg total) by mouth daily. What changed: when to take this   furosemide 20 MG tablet Commonly known as: LASIX Take 1 tablet (20 mg total) by mouth daily.   hydrochlorothiazide 25 MG tablet Commonly  known as: HYDRODIURIL Take 25 mg by mouth at bedtime.   losartan 50 MG tablet Commonly known as: COZAAR TAKE 1 TABLET BY MOUTH  DAILY What changed: when to take this   nitroGLYCERIN 0.4 MG SL tablet Commonly known as: Nitrostat Place 1 tablet (0.4 mg total) under the tongue every 5 (  five) minutes as needed for chest pain.   potassium chloride SA 20 MEQ tablet Commonly known as: KLOR-CON Take 1 tablet (20 mEq total) by mouth daily.   pravastatin 20 MG tablet Commonly known as: PRAVACHOL Take 1 tablet (20 mg total) by mouth 2 (two) times a week. Monday and Friday   ranolazine 1000 MG SR tablet Commonly known as: Ranexa Take 1 tablet (1,000 mg total) by mouth 2 (two) times daily. What changed:   medication strength  how much to take       Outstanding Labs/Studies   None   Duration of Discharge Encounter   Greater than 30 minutes including physician time.  Signed, Kathyrn Drown, NP 08/17/2019, 1:05 PM   Attending Note:   The patient was seen and examined.  Agree with assessment and plan as noted above.  Changes made to the above note as needed.  Patient seen and independently examined with Kathyrn Drown, NP .   We discussed all aspects of the encounter. I agree with the assessment and plan as stated above.  1.  CAD : s/p atherectomy  And  stenting of LAD . Doing well  On ASA and plavix .  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  Essential HTN:  Cont meds.   Follow up with Dr. Bettina Gavia   3.  Hyperlipidemia  Continue diet and exercise . Cont meds.    I have spent a total of 40 minutes with patient reviewing hospital  notes , telemetry, EKGs, labs and examining patient as well as establishing an assessment and plan that was discussed with the patient. > 50% of time was spent in direct patient care.    Thayer Headings, Brooke Bonito., MD, [facility] 08/22/2019, 9:08 PM [address]  [place] Pager [phone]

## 2019-08-17 NOTE — Progress Notes (Signed)
CARDIAC REHAB PHASE I   PRE:  Rate/Rhythm: 56 SB BBB  BP:  Supine:   Sitting: 144/76  Standing:    SaO2: 96%RA  MODE:  Ambulation: 970 ft   POST:  Rate/Rhythm: 73 SR BBB  BP:  Supine:   Sitting: 165/80  Standing:    SaO2: 96%RA 0800-0850 Pt stated he was having 5 on pain scale of chest discomfort. Similar to what he had coming in but not quite the same. We walked 970 ft and remained 5 or less. Pt stated walk took his mind off discomfort. Brief review of ed as pt seen by Cardiac Rehab a couple of weeks ago. Stressed importance of plavix with stent. Reviewed NTG use, gave heart healthy diet and ex guidelines. Pt stated he had already been contacted by [facility]. Will send update to let them know pt has had staged PCI. Pt knows to notify RN if chest discomfort worsens.   Luetta Nutting, RN BSN  08/17/2019 8:45 AM

## 2019-08-17 NOTE — Plan of Care (Signed)

## 2019-08-17 NOTE — Progress Notes (Addendum)
Progress Note  Patient Name: Craig Lawson Date of Encounter: 08/17/2019  Primary Cardiologist: Shirlee More, MD   Subjective   Continues to have chest pain. Will up titrate Ranexa. No SOB   Inpatient Medications    Scheduled Meds: . aspirin  81 mg Oral Daily  . clopidogrel  75 mg Oral Q breakfast  . ezetimibe  10 mg Oral Daily  . furosemide  20 mg Oral Daily  . hydrochlorothiazide  25 mg Oral QHS  . losartan  50 mg Oral Daily  . potassium chloride SA  20 mEq Oral Daily  . [START ON 08/18/2019] pravastatin  20 mg Oral Once per day on Mon Thu  . ranolazine  500 mg Oral BID  . sodium chloride flush  3 mL Intravenous Q12H   Continuous Infusions: . sodium chloride     PRN Meds: sodium chloride, acetaminophen, HYDROmorphone (DILAUDID) injection, nitroGLYCERIN, ondansetron (ZOFRAN) IV, sodium chloride flush   Vital Signs    Vitals:   08/16/19 2037 08/16/19 2143 08/17/19 0400 08/17/19 0401  BP: (!) 143/84 (!) 158/76  (!) 147/70  Pulse: (!) 59 (!) 57  (!) 56  Resp: 14 16  18   Temp: 98.2 F (36.8 C) 98.5 F (36.9 C)  98 F (36.7 C)  TempSrc: Oral Oral  Oral  SpO2: 96% 98%  97%  Weight:   113.2 kg   Height:        Intake/Output Summary (Last 24 hours) at 08/17/2019 0842 Last data filed at 08/17/2019 0402 Gross per 24 hour  Intake 1023.13 ml  Output 1400 ml  Net -376.87 ml   Filed Weights   08/16/19 0500 08/16/19 0544 08/17/19 0400  Weight: 113.5 kg 113.4 kg 113.2 kg   Physical Exam   General: Well developed, well nourished, NAD Neck: Negative for carotid bruits. No JVD Lungs:Clear to ausculation bilaterally. No wheezes, rales, or rhonchi. Breathing is unlabored. Cardiovascular: RRR with S1 S2. No murmurs Extremities: No edema. Radial pulses 2+ bilaterally Neuro: Alert and oriented. No focal deficits. No facial asymmetry. MAE spontaneously. Psych: Responds to questions appropriately with normal affect.    Labs    Chemistry Recent Labs  Lab  08/17/19 0508  NA 137  K 3.5  CL 101  CO2 26  GLUCOSE 109*  BUN 15  CREATININE 0.86  CALCIUM 9.0  GFRNONAA >60  GFRAA >60  ANIONGAP 10     Hematology Recent Labs  Lab 08/17/19 0508  WBC 7.4  RBC 5.16  HGB 15.9  HCT 45.1  MCV 87.4  MCH 30.8  MCHC 35.3  RDW 13.1  PLT 135*    Cardiac EnzymesNo results for input(s): TROPONINI in the last 168 hours. No results for input(s): TROPIPOC in the last 168 hours.   BNPNo results for input(s): BNP, PROBNP in the last 168 hours.   DDimer No results for input(s): DDIMER in the last 168 hours.   Radiology    CARDIAC CATHETERIZATION  Result Date: 08/16/2019  2nd Diag lesion is 80% stenosed.  Balloon angioplasty was performed using a BALLOON SAPPHIRE 2.0X12.  Post intervention, there is a 25% residual stenosis.  Prox LAD to Mid LAD lesion is 70% stenosed.  A drug-eluting stent was successfully placed using a SYNERGY XD 2.75X38, after orbital atherectomy, dilated to 3.25 mm. Stent size optimized with IVUS.  Post intervention, there is a 0% residual stenosis.  Ost Cx to Prox Cx lesion is 50% stenosed.  Dist RCA lesion is 50% stenosed.  RPDA  stent that was treated a few weeks ago was widely patent.  1st Diag lesion is 75% stenosed.  LV end diastolic pressure is normal.  There is mild aortic valve stenosis. Mild gradient noted on pullback.  Continue aggressive secondary prevention.  Would recommend clopidogrel monotherapy after 6 months of DAPT given his extensive disease. Given the residual pain from the jailed diagonals, will watch overnight and treat with pain medication.   Telemetry    08/17/19 NSR  - Personally Reviewed  ECG    No new tracing as of 08/17/19- Personally Reviewed  Cardiac Studies   LHC with arthrectomy 08/16/19:   2nd Diag lesion is 80% stenosed.  Balloon angioplasty was performed using a BALLOON SAPPHIRE 2.0X12.  Post intervention, there is a 25% residual stenosis.  Prox LAD to Mid LAD lesion is 70%  stenosed.  A drug-eluting stent was successfully placed using a SYNERGY XD 2.75X38, after orbital atherectomy, dilated to 3.25 mm. Stent size optimized with IVUS.  Post intervention, there is a 0% residual stenosis.  Ost Cx to Prox Cx lesion is 50% stenosed.  Dist RCA lesion is 50% stenosed.  RPDA stent that was treated a few weeks ago was widely patent.  1st Diag lesion is 75% stenosed.  LV end diastolic pressure is normal.  There is mild aortic valve stenosis. Mild gradient noted on pullback.   Continue aggressive secondary prevention.  Would recommend clopidogrel monotherapy after 6 months of DAPT given his extensive disease.   Given the residual pain from the jailed diagonals, will watch overnight and treat with pain medication.      Cardiac cath 11/01/2018: Diagnostic Summary Severe stenosis of the Marginal Branch, PDA Interventional Summary Successful PCI / 2.25 X 23 Xience Drug Eluting Stent of the mid Posterior Descending Coronary Artery. Successful PCI / 3.25 X 18 Xience Drug Eluting Stent of the proximal 2nd Obtuse Marginal Coronary Artery. Cardiac Arteries and Lesion Findings LMCA: Lesion on LMCA: 5% stenosis 6 mm length . LAD: Lesion on Prox LAD: 70% stenosis 23 mm length . Lesion on 1st Diag: 60% stenosis 10 mm length . LCx: Lesion on 2nd Ob Marg: Proximal subsection.95% stenosis 16 mm lengthreduced to 0%. Pre procedure TIMI III flow was noted. Post Procedure TIMI III flow was present. Good run off was present. The lesion was diagnosed as Moderate Risk (B). The lesion was heavily calcified.The lesion showed mildangulation.  Echo, 11/11/2018: 1. The left ventricle has normal systolic function, with an ejection fraction of 55-60%. The cavity size was normal. There is moderate concentric left ventricular hypertrophy. Left ventricular diastolic Doppler parameters are consistent with impaired  relaxation. No evidence of left ventricular regional wall  motion abnormalities. 2. The right ventricle has normal systolic function. The cavity was normal. There is no increase in right ventricular wall thickness. 3. The mitral valve is degenerative. There is mild mitral annular calcification present. No evidence of mitral valve stenosiswith a calculated valve area of 1.36 cm. 4. The aortic valve is tricuspid. Mild thickening of the aortic valve. Mild calcification of the aortic valve. Aortic valve regurgitation is mild by color flow Doppler. Mild stenosis of the aortic valve. 5. The aorta is normal in size and structure. 6. The aortic root, ascending aorta and aortic arch are normal in size and structure.  MPI, 12/07/2018: Study Highlights  The left ventricular ejection fraction is mildly decreased (45-54%).  Nuclear stress EF: 51%.  There was no ST segment deviation noted during stress.  The study is normal.  This is a low risk study.    Patient Profile     74 y.o. male with a hx of CAD nonSTEMI 10/20/27 with PCI and stent of OM and RCAmild AS/AR, hypertension, hyperlipidemia and LBBB  Assessment & Plan    1. CAD: -LHC performed 08/16/19 with balloon angioplasty to 2nd Diagonal, PCI/DES to pLAD-mLAD, previous RPDA widely patent. There is a 75% 1st Diagonal lesion which was not treated. Plan for DAPT with ASA and Plavix for at least 6 months then Plavix monotherapy long term  -Continue ASA, Plavix, statin   -No BB in the setting of bradycardia  -Creatinine stable at 0.86 -Cath site stable  -Will up-titrate Ranexa given persistent pain>>discuss plan with MD   2. HTN: -Stable, 147/70>158/76>143/84 -Continue current regimen   3. HLD: -Last LDL, 82 with goal <70  -Continue statin, zetia  -Repeat labs in 6-8 weeks   4. TIA: -No symptoms -Continue ASA, statin   Signed, Georgie Chard NP-C [facility] Pager: [phone] 08/17/2019, 8:42 AM     For questions or updates, please contact   Please consult [email or web address]  for contact info under Cardiology/STEMI.  Attending Note:   The patient was seen and examined.  Agree with assessment and plan as noted above.  Changes made to the above note as needed.  Patient seen and independently examined with Georgie Chard, NP .   We discussed all aspects of the encounter. I agree with the assessment and plan as stated above.  1.  CAD : s/p atherectomy  And  stenting of LAD . Doing well  On ASA and plavix .  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  Essential HTN:  Cont meds.   Follow up with Dr. Dulce Sellar   3.  Hyperlipidemia  Continue diet and exercise . Cont meds.    I have spent a total of 40 minutes with patient reviewing hospital  notes , telemetry, EKGs, labs and examining patient as well as establishing an assessment and plan that was discussed with the patient. > 50% of time was spent in direct patient care.    Vesta Mixer, Montez Hageman., MD, [facility] 08/17/2019, 11:19 AM [address]  [address] Office [phone] Pager [phone]

## 2019-08-21 ENCOUNTER — Other Ambulatory Visit: Payer: Self-pay | Admitting: Cardiology

## 2019-08-24 ENCOUNTER — Other Ambulatory Visit: Payer: Self-pay | Admitting: *Deleted

## 2019-08-24 MED ORDER — NITROGLYCERIN 0.4 MG SL SUBL: 0.4000 mg | SUBLINGUAL_TABLET | SUBLINGUAL | 6 refills | Status: DC | PRN

## 2019-08-24 NOTE — Telephone Encounter (Signed)
Received fax request for refill on Nitroglycerin. Rx sent to pharmacy for #30 and 6 refills.

## 2019-08-26 ENCOUNTER — Ambulatory Visit: Payer: Medicare Other | Admitting: Cardiology

## 2019-08-26 ENCOUNTER — Encounter: Payer: Self-pay | Admitting: Cardiology

## 2019-08-26 ENCOUNTER — Other Ambulatory Visit: Payer: Self-pay

## 2019-08-26 VITALS — BP 120/88 | HR 62 | Ht 69.0 in | Wt 254.0 lb

## 2019-08-26 DIAGNOSIS — I10 Essential (primary) hypertension: Secondary | ICD-10-CM

## 2019-08-26 DIAGNOSIS — G459 Transient cerebral ischemic attack, unspecified: Secondary | ICD-10-CM

## 2019-08-26 DIAGNOSIS — I350 Nonrheumatic aortic (valve) stenosis: Secondary | ICD-10-CM

## 2019-08-26 DIAGNOSIS — I25118 Atherosclerotic heart disease of native coronary artery with other forms of angina pectoris: Secondary | ICD-10-CM | POA: Diagnosis not present

## 2019-08-26 DIAGNOSIS — E669 Obesity, unspecified: Secondary | ICD-10-CM

## 2019-08-26 DIAGNOSIS — E782 Mixed hyperlipidemia: Secondary | ICD-10-CM | POA: Insufficient documentation

## 2019-08-26 MED ORDER — FUROSEMIDE 20 MG PO TABS: 20.0000 mg | ORAL_TABLET | ORAL | 3 refills | Status: DC

## 2019-08-26 MED ORDER — POTASSIUM CHLORIDE CRYS ER 20 MEQ PO TBCR: 20.0000 meq | EXTENDED_RELEASE_TABLET | ORAL | 3 refills | Status: DC

## 2019-08-26 NOTE — Patient Instructions (Addendum)
Medication Instructions:  1) Take Lasix every Tuesday- Thursday- Saturday   2) Take Potassium on the days you take Lasix   *If you need a refill on your cardiac medications before your next appointment, please call your pharmacy*   Lab Work: Bmp & Mag- today   If you have labs (blood work) drawn today and your tests are completely normal, you will receive your results only by: Marland Kitchen MyChart Message (if you have MyChart) OR . A paper copy in the mail If you have any lab test that is abnormal or we need to change your treatment, we will call you to review the results.   Testing/Procedures: None ordered    Follow-Up: At [facility], you and your health needs are our priority.  As part of our continuing mission to provide you with exceptional heart care, we have created designated Provider Care Teams.  These Care Teams include your primary Cardiologist (physician) and Advanced Practice Providers (APPs -  Physician Assistants and Nurse Practitioners) who all work together to provide you with the care you need, when you need it.  We recommend signing up for the patient portal called "MyChart".  Sign up information is provided on this After Visit Summary.  MyChart is used to connect with patients for Virtual Visits (Telemedicine).  Patients are able to view lab/test results, encounter notes, upcoming appointments, etc.  Non-urgent messages can be sent to your provider as well.   To learn more about what you can do with MyChart, go to [email or web address].    Your next appointment:   3 month(s)  The format for your next appointment:   In Person  Provider:   Thomasene Ripple, DO   Other Instructions None

## 2019-08-26 NOTE — Progress Notes (Signed)
Cardiology Office Note:    Date:  08/26/2019   ID:  Craig Lawson, DOB [birth year], MRN [id number]  PCP:  Tarri Fuller, MD  Cardiologist:  Norman Herrlich, MD  Electrophysiologist:  None   Referring MD: Tarri Fuller, MD   Chief Complaint  Patient presents with  . Follow-up    History of Present Illness:    Craig Lawson a 74 y.o.malewith a hx of CAD nonSTEMI 10/20/27 with PCI and stent of OM and RCAmild AS/AR, hypertension, hyperlipidemia and LBBB. I did see the patient 1 July 05, 2019 at that time he reported that he has been experiencing significant shortness of breath on exertion.  He noted that he did experience some intermittent chest pain with this.  He described it as a dull sensation of left-sided chest pain which started every time he exerted himself and then resolves with rest. At that visit, I started the patient on antianginal with renaxa, due to bilateral leg edema started lasix with potassium supplement and ask him to follow up in 2 weeks.    I did see the patient on July 25, 2019 at that time he was not responding to his antianginals therefore I recommended he undergo left heart catheterization.  His initial catheterization was on July 28, 2019 at that time he had a balloon angioplasty of his RPDA and his mid LAD which was 90%.  He was then scheduled for elective PCI with atherectomy 2 weeks later.  He did undergo his coronary atherectomy with PCI of his mid LAD with a Synergy XD drug-eluting stent size optimized with IVUS.  He was watched overnight and discharged home.  He is here today for follow-up visit, he has had some improvement with his PCI in Ranexa use.  He is inquiring if he can stop his Ranexa.  He has not yet started cardiac rehab.  Past Medical History:  Diagnosis Date  . Aortic stenosis, mild 08/10/2015  . Bilateral leg edema 08/22/2015  . Bradycardia 06/01/2017  . Cellulitis of left lower extremity 04/29/2017  . Coronary artery disease involving  native coronary artery 10/22/2017  . DOE (dyspnea on exertion) 05/04/2017  . Essential hypertension 08/13/2015  . Heart murmur 04/29/2017  . History of transient ischemic attack (TIA) 10/30/2017  . Kissing, osteophytes 08/22/2015  . LBBB (left bundle branch block) 08/22/2015  . Lipid disorder 11/30/2017  . Lumbar facet joint pain 08/22/2015  . Medicare annual wellness visit, subsequent 06/02/2016  . Mild aortic regurgitation 08/10/2015  . Mild aortic stenosis 06/01/2017  . Morbid obesity (HCC) 10/22/2017  . Murmur 04/29/2017  . Need for vaccination for Strep pneumoniae 03/27/2017  . Non-ST elevation myocardial infarction (NSTEMI), subendocardial infarction, subsequent episode of care (HCC) 11/30/2017  . Obesity, Class II, BMI 35-39.9 08/10/2015  . OSA on CPAP 12/02/2017  . Pure hypercholesterolemia 05/24/2015  . Seborrheic keratoses 03/27/2017   Overview:  Of the right forehead.  . Statin intolerance 11/30/2017  . Thrombocytopenia (HCC) 04/29/2017  . TIA (transient ischemic attack) 12/19/2016  . 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 Venous insufficiency 08/10/2015    Past Surgical History:  Procedure Laterality Date  . CARDIAC CATHETERIZATION    . CHOLECYSTECTOMY    . CORONARY ANGIOPLASTY    . CORONARY ATHERECTOMY N/A 08/16/2019   Procedure: CORONARY ATHERECTOMY;  Surgeon: Corky Crafts, MD;  [facility] INVASIVE CV LAB;  Service: Cardiovascular;  Laterality: N/A;  . CORONARY BALLOON ANGIOPLASTY N/A 07/28/2019   Procedure: CORONARY BALLOON ANGIOPLASTY;  Surgeon: Corky Crafts,  MD;  Location: [facility] INVASIVE CV LAB;  Service: Cardiovascular;  Laterality: N/A;  . ESOPHAGOGASTRODUODENOSCOPY    . INTRAVASCULAR ULTRASOUND/IVUS N/A 07/28/2019   Procedure: Intravascular Ultrasound/IVUS;  Surgeon: Corky Crafts, MD;  Location: [facility] INVASIVE CV LAB;  Service: Cardiovascular;  Laterality: N/A;  . INTRAVASCULAR ULTRASOUND/IVUS N/A 08/16/2019   Procedure: Intravascular Ultrasound/IVUS;  Surgeon: Corky Crafts, MD;  Location: [facility] INVASIVE CV LAB;  Service: Cardiovascular;  Laterality: N/A;  . LEFT HEART CATH AND CORONARY ANGIOGRAPHY N/A 07/28/2019   Procedure: LEFT HEART CATH AND CORONARY ANGIOGRAPHY;  Surgeon: Corky Crafts, MD;  Location: [facility] INVASIVE CV LAB;  Service: Cardiovascular;  Laterality: N/A;  . LEFT HEART CATH AND CORONARY ANGIOGRAPHY N/A 08/16/2019   Procedure: LEFT HEART CATH AND CORONARY ANGIOGRAPHY;  Surgeon: Corky Crafts, MD;  Location: [facility] INVASIVE CV LAB;  Service: Cardiovascular;  Laterality: N/A;    Current Medications: Current Meds  Medication Sig  . aspirin EC 81 MG tablet Take 81 mg by mouth at bedtime.  . clopidogrel (PLAVIX) 75 MG tablet Take 1 tablet (75 mg total) by mouth at bedtime.  Marland Kitchen ezetimibe (ZETIA) 10 MG tablet Take 10 mg by mouth at bedtime.  . furosemide (LASIX) 20 MG tablet Take 1 tablet (20 mg total) by mouth as directed. Take 1 tablet by mouth on Tuesdays, Thursdays & Saturdays  . hydrochlorothiazide (HYDRODIURIL) 25 MG tablet Take 25 mg by mouth at bedtime.   Marland Kitchen losartan (COZAAR) 50 MG tablet Take 50 mg by mouth at bedtime.  . nitroGLYCERIN (NITROSTAT) 0.4 MG SL tablet Place 1 tablet (0.4 mg total) under the tongue every 5 (five) minutes as needed for chest pain.  . potassium chloride SA (KLOR-CON) 20 MEQ tablet Take 1 tablet (20 mEq total) by mouth as directed.  . pravastatin (PRAVACHOL) 20 MG tablet Take 1 tablet (20 mg total) by mouth 2 (two) times a week. Monday and Friday  . ranolazine (RANEXA) 1000 MG SR tablet Take 1 tablet (1,000 mg total) by mouth 2 (two) times daily.  . [DISCONTINUED] aspirin EC 81 MG tablet Take 1 tablet (81 mg total) by mouth daily. (Patient taking differently: Take 81 mg by mouth at bedtime. )  . [DISCONTINUED] ezetimibe (ZETIA) 10 MG tablet Take 1 tablet (10 mg total) by mouth daily. (Patient taking differently: Take 10 mg by mouth at bedtime. )  . [DISCONTINUED] furosemide (LASIX) 20 MG tablet Take 1 tablet (20 mg  total) by mouth daily.  . [DISCONTINUED] losartan (COZAAR) 50 MG tablet TAKE 1 TABLET BY MOUTH  DAILY (Patient taking differently: Take 50 mg by mouth at bedtime. )  . [DISCONTINUED] potassium chloride SA (KLOR-CON) 20 MEQ tablet Take 1 tablet (20 mEq total) by mouth daily.     Allergies:   Statins, Isosorbide, and Latex   Social History   Socioeconomic History  . Marital status: Married    Spouse name: Not on file  . Number of children: Not on file  . Years of education: Not on file  . Highest education level: Not on file  Occupational History  . Not on file  Tobacco Use  . Smoking status: Former Smoker    Types: Cigarettes    Quit date: 1973    Years since quitting: 48.3  . Smokeless tobacco: Never Used  Substance and Sexual Activity  . Alcohol use: Yes    Alcohol/week: 7.0 standard drinks    Types: 7 Glasses of wine per week    Comment: per week  .  Drug use: No  . Sexual activity: Not on file  Other Topics Concern  . Not on file  Social History Narrative  . Not on file   Social Determinants of Health   Financial Resource Strain:   . Difficulty of Paying Living Expenses:   Food Insecurity:   . Worried About Charity fundraiser in the Last Year:   . Arboriculturist in the Last Year:   Transportation Needs:   . Film/video editor (Medical):   Marland Kitchen Lack of Transportation (Non-Medical):   Physical Activity:   . Days of Exercise per Week:   . Minutes of Exercise per Session:   Stress:   . Feeling of Stress :   Social Connections:   . Frequency of Communication with Friends and Family:   . Frequency of Social Gatherings with Friends and Family:   . Attends Religious Services:   . Active Member of Clubs or Organizations:   . Attends Archivist Meetings:   Marland Kitchen Marital Status:      Family History: The patient's family history includes Cancer in his mother; Diabetes in his brother and sister; Hypertension in his brother.  ROS:   Review of Systems    Constitution: Negative for decreased appetite, fever and weight gain.  HENT: Negative for congestion, ear discharge, hoarse voice and sore throat.   Eyes: Negative for discharge, redness, vision loss in right eye and visual halos.  Cardiovascular: Negative for chest pain, dyspnea on exertion, leg swelling, orthopnea and palpitations.  Respiratory: Negative for cough, hemoptysis, shortness of breath and snoring.   Endocrine: Negative for heat intolerance and polyphagia.  Hematologic/Lymphatic: Negative for bleeding problem. Does not bruise/bleed easily.  Skin: Negative for flushing, nail changes, rash and suspicious lesions.  Musculoskeletal: Negative for arthritis, joint pain, muscle cramps, myalgias, neck pain and stiffness.  Gastrointestinal: Negative for abdominal pain, bowel incontinence, diarrhea and excessive appetite.  Genitourinary: Negative for decreased libido, genital sores and incomplete emptying.  Neurological: Negative for brief paralysis, focal weakness, headaches and loss of balance.  Psychiatric/Behavioral: Negative for altered mental status, depression and suicidal ideas.  Allergic/Immunologic: Negative for HIV exposure and persistent infections.    EKGs/Labs/Other Studies Reviewed:    The following studies were reviewed today:   EKG: None today  TTE IMPRESSIONS 08/03/2019 1. Left ventricular ejection fraction, by estimation, is 50 to 55%. The  left ventricle has low normal function. The left ventricle has no regional  wall motion abnormalities.  2. Right ventricular systolic function is normal. The right ventricular  size is normal.  3. The mitral valve is normal in structure. No evidence of mitral valve  regurgitation. No evidence of mitral stenosis.  4. The aortic valve is abnormal. Aortic valve regurgitation is not  visualized. Mild aortic valve stenosis.    Left heart catheterization with coronary atherectomy August 16, 2019  2nd Diag lesion is 80%  stenosed.  Balloon angioplasty was performed using a BALLOON SAPPHIRE 2.0X12.  Post intervention, there is a 25% residual stenosis.  Prox LAD to Mid LAD lesion is 70% stenosed.  A drug-eluting stent was successfully placed using a SYNERGY XD 2.75X38, after orbital atherectomy, dilated to 3.25 mm. Stent size optimized with IVUS.  Post intervention, there is a 0% residual stenosis.  Ost Cx to Prox Cx lesion is 50% stenosed.  Dist RCA lesion is 50% stenosed.  RPDA stent that was treated a few weeks ago was widely patent.  1st Diag lesion is 75% stenosed.  LV end diastolic pressure is normal.  There is mild aortic valve stenosis. Mild gradient noted on pullback. Continue aggressive secondary prevention.  Would recommend clopidogrel monotherapy after 6 months of DAPT given his extensive disease.    LHC July 28, 2019  Dist RCA lesion is 50% stenosed.  Ost Cx to Prox Cx lesion is 50% stenosed.  Mid LAD lesion is 90% stenosed. Balloon angioplasty was performed using a BALLOON SAPPHIRE 2.0X15.  Post intervention, there is a 70% residual stenosis. Rigid lesion which will require atherectomy. No visible dissection but since the lesion was modified, will delay atherectomy.  2nd Diag lesion is 80% stenosed.  RPDA lesion is 75% stenosed. Scoring balloon angioplasty was performed using a BALLOON WOLVERINE 2.75X10.  Post intervention, there is a 0% residual stenosis.  The left ventricular systolic function is normal.  LV end diastolic pressure is normal.  The left ventricular ejection fraction is 55-65% by visual estimate.  There is mild aortic valve stenosis.  Scoring balloon angioplasty was performed using a BALLOON WOLVERINE 2.75X10.  Balloon angioplasty was performed using a BALLOON SAPPHIRE 2.0X15.   Need to bring patient back for elective PCI of the LAD with atherectomy, in 2-3 weeks.   Recent Labs: 11/09/2018: ALT 32 07/05/2019: Magnesium 2.0; NT-Pro BNP 128 08/17/2019:  BUN 15; Creatinine, Ser 0.86; Hemoglobin 15.9; Platelets 135; Potassium 3.5; Sodium 137  Recent Lipid Panel    Component Value Date/Time   CHOL 189 07/05/2019 0853   TRIG 156 (H) 07/05/2019 0853   HDL 43 07/05/2019 0853   CHOLHDL 4.4 07/05/2019 0853   LDLCALC 118 (H) 07/05/2019 0853    Physical Exam:    VS:  BP 120/88 (BP Location: Right Arm, Patient Position: Sitting, Cuff Size: Normal)   Pulse 62   Ht 5\' 9"  (1.753 m)   Wt 254 lb (115.2 kg)   SpO2 94%   BMI 37.51 kg/m     Wt Readings from Last 3 Encounters:  08/26/19 254 lb (115.2 kg)  08/17/19 249 lb 9.6 oz (113.2 kg)  07/29/19 250 lb 3.6 oz (113.5 kg)     GEN: Well nourished, well developed in no acute distress HEENT: Normal NECK: No JVD; No carotid bruits LYMPHATICS: No lymphadenopathy CARDIAC: S1S2 noted,RRR, no murmurs, rubs, gallops RESPIRATORY:  Clear to auscultation without rales, wheezing or rhonchi  ABDOMEN: Soft, non-tender, non-distended, +bowel sounds, no guarding. EXTREMITIES: Bilateral trace ankle edema, No cyanosis, no clubbing MUSCULOSKELETAL:  No deformity  SKIN: Warm and dry NEUROLOGIC:  Alert and oriented x 3, non-focal PSYCHIATRIC:  Normal affect, good insight  ASSESSMENT:    1. Coronary artery disease of native artery of native heart with stable angina pectoris (HCC)   2. Essential hypertension   3. TIA (transient ischemic attack)   4. Mild aortic stenosis   5. Mixed hyperlipidemia   6. Obesity (BMI 30-39.9)    PLAN:     Clinically his chest pain has improved frequently since his procedures and his recent stent.  He understand that he is going to be on antiplatelet therapy with aspirin and Plavix for the next 6 months.  We will continue the patient on pravastatin 20 mg daily along with Zetia 10 mg daily.  He is asking to stop his Ranexa, I have asked the patient that is okay to hold this since he has had stent placement and hopefully his revascularization have improved his ischemia.  He is  pending cardiac rehab and has his schedule for June 15.  For now we  discussed exercise as much as he can tolerate.  He does have bilateral leg edema but has improved tremendously we will continue patient on Lasix however at 20 mg Tuesday Thursday and Saturdays long with his potassium supplements.  His blood pressure is appropriate we will continue him on his losartan 50 mg daily with hydrochlorothiazide 25 mg daily.  The patient is in agreement with the above plan. The patient left the office in stable condition.  The patient will follow up with Dr. Dulce SellarMunley in 3 months.   Medication Adjustments/Labs and Tests Ordered: Current medicines are reviewed at length with the patient today.  Concerns regarding medicines are outlined above.  Orders Placed This Encounter  Procedures  . Basic metabolic panel  . Magnesium   Meds ordered this encounter  Medications  . furosemide (LASIX) 20 MG tablet    Sig: Take 1 tablet (20 mg total) by mouth as directed. Take 1 tablet by mouth on Tuesdays, Thursdays & Saturdays    Dispense:  15 tablet    Refill:  3  . potassium chloride SA (KLOR-CON) 20 MEQ tablet    Sig: Take 1 tablet (20 mEq total) by mouth as directed.    Dispense:  15 tablet    Refill:  3    Take 1 tablet by mouth on Tuesdays, Thursdays & Saturdays    Patient Instructions  Medication Instructions:  1) Take Lasix every Tuesday- Thursday- Saturday   2) Take Potassium on the days you take Lasix   *If you need a refill on your cardiac medications before your next appointment, please call your pharmacy*   Lab Work: Bmp & Mag- today   If you have labs (blood work) drawn today and your tests are completely normal, you will receive your results only by: Marland Kitchen. MyChart Message (if you have MyChart) OR . A paper copy in the mail If you have any lab test that is abnormal or we need to change your treatment, we will call you to review the results.   Testing/Procedures: None ordered     Follow-Up: At [facility], you and your health needs are our priority.  As part of our continuing mission to provide you with exceptional heart care, we have created designated Provider Care Teams.  These Care Teams include your primary Cardiologist (physician) and Advanced Practice Providers (APPs -  Physician Assistants and Nurse Practitioners) who all work together to provide you with the care you need, when you need it.  We recommend signing up for the patient portal called "MyChart".  Sign up information is provided on this After Visit Summary.  MyChart is used to connect with patients for Virtual Visits (Telemedicine).  Patients are able to view lab/test results, encounter notes, upcoming appointments, etc.  Non-urgent messages can be sent to your provider as well.   To learn more about what you can do with MyChart, go to [email or web address].    Your next appointment:   3 month(s)  The format for your next appointment:   In Person  Provider:   Thomasene RippleKardie Mae Denunzio, DO   Other Instructions None      Adopting a Healthy Lifestyle.  Know what a healthy weight is for you (roughly BMI <25) and aim to maintain this   Aim for 7+ servings of fruits and vegetables daily   65-80+ fluid ounces of water or unsweet tea for healthy kidneys   Limit to max 1 drink of alcohol per day; avoid smoking/tobacco   Limit animal fats in diet  for cholesterol and heart health - choose grass fed whenever available   Avoid highly processed foods, and foods high in saturated/trans fats   Aim for low stress - take time to unwind and care for your mental health   Aim for 150 min of moderate intensity exercise weekly for heart health, and weights twice weekly for bone health   Aim for 7-9 hours of sleep daily   When it comes to diets, agreement about the perfect plan isnt easy to find, even among the experts. Experts at the [facility] [employer] developed an idea known as the Healthy  Eating Plate. Just imagine a plate divided into logical, healthy portions.   The emphasis is on diet quality:   Load up on vegetables and fruits - one-half of your plate: Aim for color and variety, and remember that potatoes dont count.   Go for whole grains - one-quarter of your plate: Whole wheat, barley, wheat berries, quinoa, oats, brown rice, and foods made with them. If you want pasta, go with whole wheat pasta.   Protein power - one-quarter of your plate: Fish, chicken, beans, and nuts are all healthy, versatile protein sources. Limit red meat.   The diet, however, does go beyond the plate, offering a few other suggestions.   Use healthy plant oils, such as olive, canola, soy, corn, sunflower and peanut. Check the labels, and avoid partially hydrogenated oil, which have unhealthy trans fats.   If youre thirsty, drink water. Coffee and tea are good in moderation, but skip sugary drinks and limit milk and dairy products to one or two daily servings.   The type of carbohydrate in the diet is more important than the amount. Some sources of carbohydrates, such as vegetables, fruits, whole grains, and beans-are healthier than others.   Finally, stay active  Signed, Thomasene Ripple, DO  08/26/2019 2:58 PM    [facility]

## 2019-08-27 LAB — BASIC METABOLIC PANEL
BUN/Creatinine Ratio: 13 (ref 10–24)
BUN: 13 mg/dL (ref 8–27)
CO2: 26 mmol/L (ref 20–29)
Calcium: 9.6 mg/dL (ref 8.6–10.2)
Chloride: 98 mmol/L (ref 96–106)
Creatinine, Ser: 1.03 mg/dL (ref 0.76–1.27)
GFR calc Af Amer: 82 mL/min/{1.73_m2} (ref >59)
GFR calc non Af Amer: 71 mL/min/{1.73_m2} (ref >59)
Glucose: 88 mg/dL (ref 65–99)
Potassium: 4.2 mmol/L (ref 3.5–5.2)
Sodium: 138 mmol/L (ref 134–144)

## 2019-08-27 LAB — MAGNESIUM: Magnesium: 2.1 mg/dL (ref 1.6–2.3)

## 2019-08-29 ENCOUNTER — Telehealth: Payer: Self-pay

## 2019-08-29 NOTE — Telephone Encounter (Signed)
Left message on patients voicemail to please return our call.   

## 2019-08-29 NOTE — Telephone Encounter (Signed)
-----   Message from Thomasene Ripple, DO sent at 08/27/2019  2:20 PM EDT ----- Randie Heinz results of your lab result.  Stable results

## 2019-08-30 NOTE — Telephone Encounter (Signed)
Spoke with patient regarding results and recommendation.  Patient verbalizes understanding and is agreeable to plan of care. Advised patient to call back with any issues or concerns.  

## 2019-08-30 NOTE — Telephone Encounter (Signed)
Patient called back returning Morgan's call 

## 2019-08-31 ENCOUNTER — Telehealth: Payer: Self-pay

## 2019-08-31 MED ORDER — NITROGLYCERIN 0.4 MG SL SUBL: 0.4000 mg | SUBLINGUAL_TABLET | SUBLINGUAL | 2 refills | Status: DC | PRN

## 2019-08-31 NOTE — Telephone Encounter (Signed)
Refill sent for Nitroglycerin.

## 2019-09-02 DIAGNOSIS — G729 Myopathy, unspecified: Secondary | ICD-10-CM

## 2019-09-02 HISTORY — DX: Myopathy, unspecified: G72.9

## 2019-09-09 ENCOUNTER — Telehealth: Payer: Self-pay

## 2019-09-09 NOTE — Telephone Encounter (Signed)
Either should be ok. I don't really know the difference since the potassium dose is the same.

## 2019-09-26 ENCOUNTER — Ambulatory Visit: Payer: Medicare Other | Admitting: Cardiology

## 2019-11-01 ENCOUNTER — Other Ambulatory Visit: Payer: Self-pay | Admitting: Cardiology

## 2019-11-10 ENCOUNTER — Telehealth: Payer: Self-pay | Admitting: Cardiology

## 2019-11-10 NOTE — Telephone Encounter (Signed)
Melissa from [facility] called and wanted to speak with the doctor about some things that have happened while the patient has been in Cardiac Rehab. Please ask for Melissa when calling cardiac rehab

## 2019-11-10 NOTE — Telephone Encounter (Signed)
LVM for Melissa to return call to [facility] triage.   I will forward to Dr. Servando Salina to review his last cardiac rehab note from 7/22 and the below comments:  "Other Comments: Pt adjusting to class well with good attendance. Pt feels like since program he is able to walk further. Pt denies CP. Pt c/o tightness in chest. He states it "feels like a restriction on my chest. He has not discussed this with his doctor. He says the tightness comes on with exertion and most of the time relieves with rest, but every now and then it "lingers" and he "has learned to deal with it." I encouraged him that if he experiences it "lingering" to take a NTG tab to see if it relieves faster. I will also send a note to his cardiologist to make them aware. Reviewed medications with pt and no changes in last 30 days. Pt is able to play golf, but states he's still not back at level. "

## 2019-11-28 ENCOUNTER — Other Ambulatory Visit: Payer: Self-pay

## 2019-11-28 ENCOUNTER — Ambulatory Visit: Payer: Medicare Other | Admitting: Cardiology

## 2019-11-28 ENCOUNTER — Encounter: Payer: Self-pay | Admitting: Cardiology

## 2019-11-28 VITALS — BP 130/80 | HR 54 | Ht 69.0 in | Wt 251.0 lb

## 2019-11-28 DIAGNOSIS — I2510 Atherosclerotic heart disease of native coronary artery without angina pectoris: Secondary | ICD-10-CM | POA: Diagnosis not present

## 2019-11-28 DIAGNOSIS — I10 Essential (primary) hypertension: Secondary | ICD-10-CM

## 2019-11-28 DIAGNOSIS — Z9989 Dependence on other enabling machines and devices: Secondary | ICD-10-CM

## 2019-11-28 DIAGNOSIS — I351 Nonrheumatic aortic (valve) insufficiency: Secondary | ICD-10-CM

## 2019-11-28 DIAGNOSIS — E782 Mixed hyperlipidemia: Secondary | ICD-10-CM

## 2019-11-28 DIAGNOSIS — G459 Transient cerebral ischemic attack, unspecified: Secondary | ICD-10-CM

## 2019-11-28 DIAGNOSIS — E8881 Metabolic syndrome: Secondary | ICD-10-CM

## 2019-11-28 DIAGNOSIS — I447 Left bundle-branch block, unspecified: Secondary | ICD-10-CM

## 2019-11-28 DIAGNOSIS — E669 Obesity, unspecified: Secondary | ICD-10-CM

## 2019-11-28 DIAGNOSIS — G4733 Obstructive sleep apnea (adult) (pediatric): Secondary | ICD-10-CM

## 2019-11-28 DIAGNOSIS — I350 Nonrheumatic aortic (valve) stenosis: Secondary | ICD-10-CM

## 2019-11-28 MED ORDER — RANOLAZINE ER 1000 MG PO TB12: 1000.0000 mg | ORAL_TABLET | Freq: Two times a day (BID) | ORAL | 2 refills | Status: DC

## 2019-11-28 NOTE — Progress Notes (Addendum)
Cardiology Office Note:    Date:  11/28/2019   ID:  Craig Lawson, DOB Sep 24, 1945, MRN [id number]  PCP:  Tarri Fuller, MD  Cardiologist:  Norman Herrlich, MD  Electrophysiologist:  None   Referring MD: Tarri Fuller, MD   " I am having some chest pain"   History of Present Illness:    Craig Lawson a 74 y.o.malewith a hx of CAD status post recent PCI and coronary atherectomy with PCI of his mid LAD with a Synergy XD drug-eluting stent size optimized with IVUS, Mild AS/AR, hypertension, hyperlipidemia and LBBB.   I last saw the patient in May 2021 at that time he was status post PCI and therefore is requesting to stop his Ranexa.  He says he has been doing well taking his dual antiplatelet therapy with aspirin and Plavix.  But recently he has been experiencing some mild intermittent chest pain on exertion.  He denies any shortness of breath.    Past Medical History:  Diagnosis Date  . Aortic stenosis, mild 08/10/2015  . Bilateral leg edema 08/22/2015  . Bradycardia 06/01/2017  . Cellulitis of left lower extremity 04/29/2017  . Coronary artery disease involving native coronary artery 10/22/2017  . DOE (dyspnea on exertion) 05/04/2017  . Essential hypertension 08/13/2015  . Heart murmur 04/29/2017  . History of transient ischemic attack (TIA) 10/30/2017  . Kissing, osteophytes 08/22/2015  . LBBB (left bundle branch block) 08/22/2015  . Lipid disorder 11/30/2017  . Lumbar facet joint pain 08/22/2015  . Medicare annual wellness visit, subsequent 06/02/2016  . Mild aortic regurgitation 08/10/2015  . Mild aortic stenosis 06/01/2017  . Morbid obesity (HCC) 10/22/2017  . Murmur 04/29/2017  . Need for vaccination for Strep pneumoniae 03/27/2017  . Non-ST elevation myocardial infarction (NSTEMI), subendocardial infarction, subsequent episode of care (HCC) 11/30/2017  . Obesity, Class II, BMI 35-39.9 08/10/2015  . OSA on CPAP 12/02/2017  . Pure hypercholesterolemia 05/24/2015  . Seborrheic keratoses  03/27/2017   Overview:  Of the right forehead.  . Statin intolerance 11/30/2017  . Thrombocytopenia (HCC) 04/29/2017  . TIA (transient ischemic attack) 12/19/2016  . 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 Venous insufficiency 08/10/2015    Past Surgical History:  Procedure Laterality Date  . CARDIAC CATHETERIZATION    . CHOLECYSTECTOMY    . CORONARY ANGIOPLASTY    . CORONARY ATHERECTOMY N/A 08/16/2019   Procedure: CORONARY ATHERECTOMY;  Surgeon: Corky Crafts, MD;  Location: [facility] INVASIVE CV LAB;  Service: Cardiovascular;  Laterality: N/A;  . CORONARY BALLOON ANGIOPLASTY N/A 07/28/2019   Procedure: CORONARY BALLOON ANGIOPLASTY;  Surgeon: Corky Crafts, MD;  Location: [facility] INVASIVE CV LAB;  Service: Cardiovascular;  Laterality: N/A;  . ESOPHAGOGASTRODUODENOSCOPY    . INTRAVASCULAR ULTRASOUND/IVUS N/A 07/28/2019   Procedure: Intravascular Ultrasound/IVUS;  Surgeon: Corky Crafts, MD;  Location: [facility] INVASIVE CV LAB;  Service: Cardiovascular;  Laterality: N/A;  . INTRAVASCULAR ULTRASOUND/IVUS N/A 08/16/2019   Procedure: Intravascular Ultrasound/IVUS;  Surgeon: Corky Crafts, MD;  Location: [facility] INVASIVE CV LAB;  Service: Cardiovascular;  Laterality: N/A;  . LEFT HEART CATH AND CORONARY ANGIOGRAPHY N/A 07/28/2019   Procedure: LEFT HEART CATH AND CORONARY ANGIOGRAPHY;  Surgeon: Corky Crafts, MD;  Location: [facility] INVASIVE CV LAB;  Service: Cardiovascular;  Laterality: N/A;  . LEFT HEART CATH AND CORONARY ANGIOGRAPHY N/A 08/16/2019   Procedure: LEFT HEART CATH AND CORONARY ANGIOGRAPHY;  Surgeon: Corky Crafts, MD;  Location: [facility] INVASIVE CV LAB;  Service: Cardiovascular;  Laterality: N/A;  Current Medications: Current Meds  Medication Sig  . aspirin EC 81 MG tablet Take 81 mg by mouth at bedtime.  . clopidogrel (PLAVIX) 75 MG tablet Take 1 tablet (75 mg total) by mouth at bedtime.  Marland Kitchen ezetimibe (ZETIA) 10 MG tablet Take 10 mg by mouth at bedtime.  . furosemide (LASIX) 20 MG  tablet TAKE 1 TABLET BY MOUTH ON  TUESDAYS, THURSDAYS, AND  SATURDAYS AS DIRECTED  . hydrochlorothiazide (HYDRODIURIL) 25 MG tablet Take 25 mg by mouth at bedtime.   Marland Kitchen losartan (COZAAR) 50 MG tablet Take 50 mg by mouth at bedtime.  . nitroGLYCERIN (NITROSTAT) 0.4 MG SL tablet Place 1 tablet (0.4 mg total) under the tongue every 5 (five) minutes as needed for chest pain.  . potassium chloride SA (KLOR-CON) 20 MEQ tablet Take 1 tablet by mouth once daily  . pravastatin (PRAVACHOL) 20 MG tablet Take 1 tablet (20 mg total) by mouth 2 (two) times a week. Monday and Friday  . ranolazine (RANEXA) 1000 MG SR tablet Take 1 tablet (1,000 mg total) by mouth 2 (two) times daily.  . [DISCONTINUED] furosemide (LASIX) 20 MG tablet Take 1 tablet by mouth once daily  . [DISCONTINUED] ranolazine (RANEXA) 1000 MG SR tablet Take 1 tablet (1,000 mg total) by mouth 2 (two) times daily.     Allergies:   Statins, Isosorbide, and Latex   Social History   Socioeconomic History  . Marital status: Married    Spouse name: Not on file  . Number of children: Not on file  . Years of education: Not on file  . Highest education level: Not on file  Occupational History  . Not on file  Tobacco Use  . Smoking status: Former Smoker    Types: Cigarettes    Quit date: 1973    Years since quitting: 48.6  . Smokeless tobacco: Never Used  Vaping Use  . Vaping Use: Never used  Substance and Sexual Activity  . Alcohol use: Yes    Alcohol/week: 7.0 standard drinks    Types: 7 Glasses of wine per week    Comment: per week  . Drug use: No  . Sexual activity: Not on file  Other Topics Concern  . Not on file  Social History Narrative  . Not on file   Social Determinants of Health   Financial Resource Strain:   . Difficulty of Paying Living Expenses:   Food Insecurity:   . Worried About Programme researcher, broadcasting/film/video in the Last Year:   . Barista in the Last Year:   Transportation Needs:   . Freight forwarder  (Medical):   Marland Kitchen Lack of Transportation (Non-Medical):   Physical Activity:   . Days of Exercise per Week:   . Minutes of Exercise per Session:   Stress:   . Feeling of Stress :   Social Connections:   . Frequency of Communication with Friends and Family:   . Frequency of Social Gatherings with Friends and Family:   . Attends Religious Services:   . Active Member of Clubs or Organizations:   . Attends Banker Meetings:   Marland Kitchen Marital Status:      Family History: The patient's family history includes Cancer in his mother; Diabetes in his brother and sister; Hypertension in his brother.  ROS:   Review of Systems  Constitution: Negative for decreased appetite, fever and weight gain.  HENT: Negative for congestion, ear discharge, hoarse voice and sore throat.   Eyes:  Negative for discharge, redness, vision loss in right eye and visual halos.  Cardiovascular: Negative for chest pain, dyspnea on exertion, leg swelling, orthopnea and palpitations.  Respiratory: Negative for cough, hemoptysis, shortness of breath and snoring.   Endocrine: Negative for heat intolerance and polyphagia.  Hematologic/Lymphatic: Negative for bleeding problem. Does not bruise/bleed easily.  Skin: Negative for flushing, nail changes, rash and suspicious lesions.  Musculoskeletal: Negative for arthritis, joint pain, muscle cramps, myalgias, neck pain and stiffness.  Gastrointestinal: Negative for abdominal pain, bowel incontinence, diarrhea and excessive appetite.  Genitourinary: Negative for decreased libido, genital sores and incomplete emptying.  Neurological: Negative for brief paralysis, focal weakness, headaches and loss of balance.  Psychiatric/Behavioral: Negative for altered mental status, depression and suicidal ideas.  Allergic/Immunologic: Negative for HIV exposure and persistent infections.    EKGs/Labs/Other Studies Reviewed:    The following studies were reviewed today:   EKG: None  today  TTE IMPRESSIONS 08/03/2019 1. Left ventricular ejection fraction, by estimation, is 50 to 55%. The  left ventricle has low normal function. The left ventricle has no regional  wall motion abnormalities.  2. Right ventricular systolic function is normal. The right ventricular  size is normal.  3. The mitral valve is normal in structure. No evidence of mitral valve  regurgitation. No evidence of mitral stenosis.  4. The aortic valve is abnormal. Aortic valve regurgitation is not  visualized. Mild aortic valve stenosis.    Left heart catheterization with coronary atherectomy August 16, 2019  2nd Diag lesion is 80% stenosed.  Balloon angioplasty was performed using a BALLOON SAPPHIRE 2.0X12.  Post intervention, there is a 25% residual stenosis.  Prox LAD to Mid LAD lesion is 70% stenosed.  A drug-eluting stent was successfully placed using a SYNERGY XD 2.75X38, after orbital atherectomy, dilated to 3.25 mm. Stent size optimized with IVUS.  Post intervention, there is a 0% residual stenosis.  Ost Cx to Prox Cx lesion is 50% stenosed.  Dist RCA lesion is 50% stenosed.  RPDA stent that was treated a few weeks ago was widely patent.  1st Diag lesion is 75% stenosed.  LV end diastolic pressure is normal.  There is mild aortic valve stenosis. Mild gradient noted on pullback. Continue aggressive secondary prevention. Would recommend clopidogrel monotherapy after 6 months of DAPT given his extensive disease.    LHC July 28, 2019  Dist RCA lesion is 50% stenosed.  Ost Cx to Prox Cx lesion is 50% stenosed.  Mid LAD lesion is 90% stenosed. Balloon angioplasty was performed using a BALLOON SAPPHIRE 2.0X15.  Post intervention, there is a 70% residual stenosis. Rigid lesion which will require atherectomy. No visible dissection but since the lesion was modified, will delay atherectomy.  2nd Diag lesion is 80% stenosed.  RPDA lesion is 75% stenosed. Scoring balloon  angioplasty was performed using a BALLOON WOLVERINE 2.75X10.  Post intervention, there is a 0% residual stenosis.  The left ventricular systolic function is normal.  LV end diastolic pressure is normal.  The left ventricular ejection fraction is 55-65% by visual estimate.  There is mild aortic valve stenosis.  Scoring balloon angioplasty was performed using a BALLOON WOLVERINE 2.75X10.  Balloon angioplasty was performed using a BALLOON SAPPHIRE 2.0X15.  Need to bring patient back for elective PCI of the LAD with atherectomy, in 2-3 weeks.    Recent Labs: 07/05/2019: NT-Pro BNP 128 08/17/2019: Hemoglobin 15.9; Platelets 135 08/26/2019: BUN 13; Creatinine, Ser 1.03; Magnesium 2.1; Potassium 4.2; Sodium 138  Recent Lipid Panel  Component Value Date/Time   CHOL 189 07/05/2019 0853   TRIG 156 (H) 07/05/2019 0853   HDL 43 07/05/2019 0853   CHOLHDL 4.4 07/05/2019 0853   LDLCALC 118 (H) 07/05/2019 0853    Physical Exam:    VS:  BP 130/80 (BP Location: Right Arm, Patient Position: Sitting, Cuff Size: Normal)   Pulse (!) 54   Ht 5\' 9"  (1.753 m)   Wt 251 lb (113.9 kg)   SpO2 94%   BMI 37.07 kg/m     Wt Readings from Last 3 Encounters:  11/28/19 251 lb (113.9 kg)  08/26/19 254 lb (115.2 kg)  08/17/19 249 lb 9.6 oz (113.2 kg)     GEN: Well nourished, well developed in no acute distress HEENT: Normal NECK: No JVD; No carotid bruits LYMPHATICS: No lymphadenopathy CARDIAC: S1S2 noted,RRR, no murmurs, rubs, gallops RESPIRATORY:  Clear to auscultation without rales, wheezing or rhonchi  ABDOMEN: Soft, non-tender, non-distended, +bowel sounds, no guarding. EXTREMITIES: No edema, No cyanosis, no clubbing MUSCULOSKELETAL:  No deformity  SKIN: Warm and dry NEUROLOGIC:  Alert and oriented x 3, non-focal PSYCHIATRIC:  Normal affect, good insight  ASSESSMENT:    1. Coronary artery disease involving native coronary artery, angina presence unspecified, unspecified whether native  or transplanted heart   2. Metabolic syndrome   3. Mild aortic regurgitation   4. Essential hypertension   5. LBBB (left bundle branch block)   6. TIA (transient ischemic attack)   7. Mild aortic stenosis   8. OSA on CPAP   9. Mixed hyperlipidemia   10. Obesity (BMI 30-39.9)    PLAN:     He is experiencing intermittent mild chest pain exertion.  Given the patient significant history of coronary artery disease have asked the patient to please restart his Ranexa 1000 mg twice a day.  He still takes his dual antiplatelet therapy with aspirin 81 mg daily and Plavix 75 mg daily.  He is on pravastatin which he takes twice a week and Zetia 10 mg daily.  I discussed with the patient that PCSK9 inhibitor would be great for him but he tells me that he is cannot afford his medication he prefers to stay on his current dose of statin and Zetia.  Based on his lab from March 2021 his LDL is still above target his target is 70.  But unfortunately he prefers not to try PCSK9 inhibitors.  He expresses to me concern in his condition of erectile dysfunction.  He is planning on speaking with his PCP about getting Viagra or Cialis.  I educated the patient about refraining from using nitroglycerin especially with pain less than 6 hours of use of Viagra and Cialis.  He expresses understanding.  The patient understands the need to lose weight with diet and exercise. We have discussed specific strategies for this.  Blood work will be done for BMP, mag and hemoglobin A1c.  The patient is in agreement with the above plan. The patient left the office in stable condition.  The patient will follow up in 3 months with Dr. Dulce SellarMunley.   Medication Adjustments/Labs and Tests Ordered: Current medicines are reviewed at length with the patient today.  Concerns regarding medicines are outlined above.  Orders Placed This Encounter  Procedures  . Basic metabolic panel  . Magnesium  . HgB A1c   Meds ordered this encounter    Medications  . ranolazine (RANEXA) 1000 MG SR tablet    Sig: Take 1 tablet (1,000 mg total) by mouth 2 (  two) times daily.    Dispense:  60 tablet    Refill:  2    Patient Instructions  Medication Instructions:  Your physician recommends that you continue on your current medications as directed. Please refer to the Current Medication list given to you today.  *If you need a refill on your cardiac medications before your next appointment, please call your pharmacy*   Lab Work: Your physician recommends that you return for lab work in: TODAY BMP, Mag, HGB A1C If you have labs (blood work) drawn today and your tests are completely normal, you will receive your results only by: Marland Kitchen MyChart Message (if you have MyChart) OR . A paper copy in the mail If you have any lab test that is abnormal or we need to change your treatment, we will call you to review the results.   Testing/Procedures: None   Follow-Up: At [facility], you and your health needs are our priority.  As part of our continuing mission to provide you with exceptional heart care, we have created designated Provider Care Teams.  These Care Teams include your primary Cardiologist (physician) and Advanced Practice Providers (APPs -  Physician Assistants and Nurse Practitioners) who all work together to provide you with the care you need, when you need it.  We recommend signing up for the patient portal called "MyChart".  Sign up information is provided on this After Visit Summary.  MyChart is used to connect with patients for Virtual Visits (Telemedicine).  Patients are able to view lab/test results, encounter notes, upcoming appointments, etc.  Non-urgent messages can be sent to your provider as well.   To learn more about what you can do with MyChart, go to [email or web address].    Your next appointment:   3 month(s)  The format for your next appointment:   In Person  Provider:   Norman Herrlich, MD   Other  Instructions      Adopting a Healthy Lifestyle.  Know what a healthy weight is for you (roughly BMI <25) and aim to maintain this   Aim for 7+ servings of fruits and vegetables daily   65-80+ fluid ounces of water or unsweet tea for healthy kidneys   Limit to max 1 drink of alcohol per day; avoid smoking/tobacco   Limit animal fats in diet for cholesterol and heart health - choose grass fed whenever available   Avoid highly processed foods, and foods high in saturated/trans fats   Aim for low stress - take time to unwind and care for your mental health   Aim for 150 min of moderate intensity exercise weekly for heart health, and weights twice weekly for bone health   Aim for 7-9 hours of sleep daily   When it comes to diets, agreement about the perfect plan isnt easy to find, even among the experts. Experts at the [facility] [employer] developed an idea known as the Healthy Eating Plate. Just imagine a plate divided into logical, healthy portions.   The emphasis is on diet quality:   Load up on vegetables and fruits - one-half of your plate: Aim for color and variety, and remember that potatoes dont count.   Go for whole grains - one-quarter of your plate: Whole wheat, barley, wheat berries, quinoa, oats, brown rice, and foods made with them. If you want pasta, go with whole wheat pasta.   Protein power - one-quarter of your plate: Fish, chicken, beans, and nuts are all healthy, versatile protein sources. Limit  red meat.   The diet, however, does go beyond the plate, offering a few other suggestions.   Use healthy plant oils, such as olive, canola, soy, corn, sunflower and peanut. Check the labels, and avoid partially hydrogenated oil, which have unhealthy trans fats.   If youre thirsty, drink water. Coffee and tea are good in moderation, but skip sugary drinks and limit milk and dairy products to one or two daily servings.   The type of carbohydrate in the diet  is more important than the amount. Some sources of carbohydrates, such as vegetables, fruits, whole grains, and beans-are healthier than others.   Finally, stay active  Signed, Thomasene Ripple, DO  11/28/2019 2:02 PM    [facility]

## 2019-11-28 NOTE — Patient Instructions (Signed)
Medication Instructions:  Your physician recommends that you continue on your current medications as directed. Please refer to the Current Medication list given to you today.  *If you need a refill on your cardiac medications before your next appointment, please call your pharmacy*   Lab Work: Your physician recommends that you return for lab work in: TODAY BMP, Mag, HGB A1C If you have labs (blood work) drawn today and your tests are completely normal, you will receive your results only by: Marland Kitchen MyChart Message (if you have MyChart) OR . A paper copy in the mail If you have any lab test that is abnormal or we need to change your treatment, we will call you to review the results.   Testing/Procedures: None   Follow-Up: At [facility], you and your health needs are our priority.  As part of our continuing mission to provide you with exceptional heart care, we have created designated Provider Care Teams.  These Care Teams include your primary Cardiologist (physician) and Advanced Practice Providers (APPs -  Physician Assistants and Nurse Practitioners) who all work together to provide you with the care you need, when you need it.  We recommend signing up for the patient portal called "MyChart".  Sign up information is provided on this After Visit Summary.  MyChart is used to connect with patients for Virtual Visits (Telemedicine).  Patients are able to view lab/test results, encounter notes, upcoming appointments, etc.  Non-urgent messages can be sent to your provider as well.   To learn more about what you can do with MyChart, go to [email or web address].    Your next appointment:   3 month(s)  The format for your next appointment:   In Person  Provider:   Norman Herrlich, MD   Other Instructions

## 2019-11-29 ENCOUNTER — Telehealth: Payer: Self-pay

## 2019-11-29 LAB — BASIC METABOLIC PANEL
BUN/Creatinine Ratio: 14 (ref 10–24)
BUN: 12 mg/dL (ref 8–27)
CO2: 27 mmol/L (ref 20–29)
Calcium: 9.5 mg/dL (ref 8.6–10.2)
Chloride: 100 mmol/L (ref 96–106)
Creatinine, Ser: 0.84 mg/dL (ref 0.76–1.27)
GFR calc Af Amer: 99 mL/min/{1.73_m2} (ref >59)
GFR calc non Af Amer: 86 mL/min/{1.73_m2} (ref >59)
Glucose: 86 mg/dL (ref 65–99)
Potassium: 4.6 mmol/L (ref 3.5–5.2)
Sodium: 141 mmol/L (ref 134–144)

## 2019-11-29 LAB — HEMOGLOBIN A1C
Est. average glucose Bld gHb Est-mCnc: 97 mg/dL
Hgb A1c MFr Bld: 5 % (ref 4.8–5.6)

## 2019-11-29 LAB — MAGNESIUM: Magnesium: 2.1 mg/dL (ref 1.6–2.3)

## 2019-11-29 NOTE — Telephone Encounter (Signed)
Spoke with patient regarding results and recommendation.  Patient verbalizes understanding and is agreeable to plan of care. Advised patient to call back with any issues or concerns.  

## 2019-11-29 NOTE — Telephone Encounter (Signed)
-----   Message from Thomasene Ripple, DO sent at 11/29/2019  4:51 PM EDT ----- Normal labs

## 2020-01-31 ENCOUNTER — Other Ambulatory Visit: Payer: Self-pay | Admitting: Cardiology

## 2020-02-27 NOTE — Progress Notes (Signed)
Cardiology Office Note:    Date:  02/28/2020   ID:  Donney Dice, DOB 1945-09-10, MRN [id number]  PCP:  Tarri Fuller, MD  Cardiologist:  Norman Herrlich, MD    Referring MD: Tarri Fuller, MD    ASSESSMENT:    1. Coronary artery disease of native artery of native heart with stable angina pectoris (HCC)   2. Mild aortic stenosis   3. Essential hypertension   4. LBBB (left bundle branch block)   5. Mixed hyperlipidemia    PLAN:    In order of problems listed above:  1. Stable CAD New York Heart Association class I no angina current treatment continue his long-term dual antiplatelet therapy antihypertensives and tolerated lipid-lowering treatment 2. Stable will need a follow-up echo 1 year 3. Stable continue treatment thiazide diuretic ARB 4. Stable EKG pattern 5. New maximally tolerated statin plus Zetia   Next appointment: 6 months   Medication Adjustments/Labs and Tests Ordered: Current medicines are reviewed at length with the patient today.  Concerns regarding medicines are outlined above.  Orders Placed This Encounter  Procedures  . EKG 12-Lead   No orders of the defined types were placed in this encounter.   No chief complaint on file.   History of Present Illness:    FINDLAY DAGHER is a 74 y.o. male with a hx of CAD non-ST elevation MI 10/20/2018 with PCI and stent of the obtuse marginal branch and right coronary artery mild aortic stenosis and aortic regurgitation hypertension hyperlipidemia with poor statin intolerance and left bundle branch block.  He was last seen by me 02/18/2019.  Compliance with diet, lifestyle and medications: Yes  He is doing well after his cardiac PCI no angina dyspnea palpitation or syncope tolerates current medications but only can take a statin 1 day a week and continue Zetia.  We discussed alternatives including PCSK9 inhibitors and he wants to continue his current regimen as long as he can.  He understands his aortic  stenosis has not progressed.  08/16/2019 underwent left heart catheterization and PCI of the proximal and mid LAD with orbital atherectomy and drug-eluting stent.  On pullback he had a peak to peak gradient of 26 mmHg.  Echocardiogram 08/03/2019 showed mild aortic valve stenosis with peak and mean gradients of 33/20 mmHg valve area 1.1 cm. Past Medical History:  Diagnosis Date  . Aortic stenosis, mild 08/10/2015  . Bilateral leg edema 08/22/2015  . Bradycardia 06/01/2017  . Cellulitis of left lower extremity 04/29/2017  . Coronary artery disease involving native coronary artery 10/22/2017  . DOE (dyspnea on exertion) 05/04/2017  . Essential hypertension 08/13/2015  . Heart murmur 04/29/2017  . History of transient ischemic attack (TIA) 10/30/2017  . Kissing, osteophytes 08/22/2015  . LBBB (left bundle branch block) 08/22/2015  . Lipid disorder 11/30/2017  . Lumbar facet joint pain 08/22/2015  . Medicare annual wellness visit, subsequent 06/02/2016  . Mild aortic regurgitation 08/10/2015  . Mild aortic stenosis 06/01/2017  . Morbid obesity (HCC) 10/22/2017  . Murmur 04/29/2017  . Myopathy 09/02/2019   Formatting of this note might be different from the original. Secondary to high freq statin use.  . Need for vaccination for Strep pneumoniae 03/27/2017  . Non-ST elevation myocardial infarction (NSTEMI), subendocardial infarction, subsequent episode of care (HCC) 11/30/2017  . Obesity, Class II, BMI 35-39.9 08/10/2015  . OSA on CPAP 12/02/2017  . Pure hypercholesterolemia 05/24/2015  . Seborrheic keratoses 03/27/2017   Overview:  Of the right forehead.  . Statin  intolerance 11/30/2017  . Thrombocytopenia (HCC) 04/29/2017  . TIA (transient ischemic attack) 12/19/2016  . 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 Venous insufficiency 08/10/2015    Past Surgical History:  Procedure Laterality Date  . CARDIAC CATHETERIZATION    . CHOLECYSTECTOMY    . CORONARY ANGIOPLASTY    . CORONARY ATHERECTOMY N/A 08/16/2019   Procedure:  CORONARY ATHERECTOMY;  Surgeon: Corky Crafts, MD;  Location: [facility] INVASIVE CV LAB;  Service: Cardiovascular;  Laterality: N/A;  . CORONARY BALLOON ANGIOPLASTY N/A 07/28/2019   Procedure: CORONARY BALLOON ANGIOPLASTY;  Surgeon: Corky Crafts, MD;  Location: [facility] INVASIVE CV LAB;  Service: Cardiovascular;  Laterality: N/A;  . ESOPHAGOGASTRODUODENOSCOPY    . INTRAVASCULAR ULTRASOUND/IVUS N/A 07/28/2019   Procedure: Intravascular Ultrasound/IVUS;  Surgeon: Corky Crafts, MD;  Location: [facility] INVASIVE CV LAB;  Service: Cardiovascular;  Laterality: N/A;  . INTRAVASCULAR ULTRASOUND/IVUS N/A 08/16/2019   Procedure: Intravascular Ultrasound/IVUS;  Surgeon: Corky Crafts, MD;  Location: [facility] INVASIVE CV LAB;  Service: Cardiovascular;  Laterality: N/A;  . LEFT HEART CATH AND CORONARY ANGIOGRAPHY N/A 07/28/2019   Procedure: LEFT HEART CATH AND CORONARY ANGIOGRAPHY;  Surgeon: Corky Crafts, MD;  Location: [facility] INVASIVE CV LAB;  Service: Cardiovascular;  Laterality: N/A;  . LEFT HEART CATH AND CORONARY ANGIOGRAPHY N/A 08/16/2019   Procedure: LEFT HEART CATH AND CORONARY ANGIOGRAPHY;  Surgeon: Corky Crafts, MD;  Location: [facility] INVASIVE CV LAB;  Service: Cardiovascular;  Laterality: N/A;    Current Medications: Current Meds  Medication Sig  . aspirin EC 81 MG tablet Take 81 mg by mouth at bedtime.  . clopidogrel (PLAVIX) 75 MG tablet TAKE 1 TABLET BY MOUTH AT  BEDTIME  . ezetimibe (ZETIA) 10 MG tablet Take 10 mg by mouth at bedtime.  . hydrochlorothiazide (HYDRODIURIL) 25 MG tablet Take 25 mg by mouth at bedtime.   Marland Kitchen losartan (COZAAR) 50 MG tablet Take 50 mg by mouth at bedtime.  . nitroGLYCERIN (NITROSTAT) 0.4 MG SL tablet Place 1 tablet (0.4 mg total) under the tongue every 5 (five) minutes as needed for chest pain.  . potassium chloride SA (KLOR-CON) 20 MEQ tablet Take 1 tablet by mouth once daily  . pravastatin (PRAVACHOL) 20 MG tablet Take 1 tablet (20 mg total) by mouth 2 (two)  times a week. Monday and Friday  . ranolazine (RANEXA) 1000 MG SR tablet Take 1 tablet (1,000 mg total) by mouth 2 (two) times daily. (Patient taking differently: Take 1,000 mg by mouth daily. )     Allergies:   Statins, Isosorbide, and Latex   Social History   Socioeconomic History  . Marital status: Married    Spouse name: Not on file  . Number of children: Not on file  . Years of education: Not on file  . Highest education level: Not on file  Occupational History  . Not on file  Tobacco Use  . Smoking status: Former Smoker    Types: Cigarettes    Quit date: 1973    Years since quitting: 48.8  . Smokeless tobacco: Never Used  Vaping Use  . Vaping Use: Never used  Substance and Sexual Activity  . Alcohol use: Yes    Alcohol/week: 7.0 standard drinks    Types: 7 Glasses of wine per week    Comment: per week  . Drug use: No  . Sexual activity: Not on file  Other Topics Concern  . Not on file  Social History Narrative  . Not on file  Social Determinants of Health   Financial Resource Strain:   . Difficulty of Paying Living Expenses: Not on file  Food Insecurity:   . Worried About Programme researcher, broadcasting/film/video in the Last Year: Not on file  . Ran Out of Food in the Last Year: Not on file  Transportation Needs:   . Lack of Transportation (Medical): Not on file  . Lack of Transportation (Non-Medical): Not on file  Physical Activity:   . Days of Exercise per Week: Not on file  . Minutes of Exercise per Session: Not on file  Stress:   . Feeling of Stress : Not on file  Social Connections:   . Frequency of Communication with Friends and Family: Not on file  . Frequency of Social Gatherings with Friends and Family: Not on file  . Attends Religious Services: Not on file  . Active Member of Clubs or Organizations: Not on file  . Attends Banker Meetings: Not on file  . Marital Status: Not on file     Family History: The patient's family history includes Cancer in  his mother; Diabetes in his brother and sister; Hypertension in his brother. ROS:   Please see the history of present illness.    All other systems reviewed and are negative.  EKGs/Labs/Other Studies Reviewed:    The following studies were reviewed today:  EKG:  EKG ordered today and personally reviewed.  The ekg ordered today demonstrates sinus bradycardia 51 bpm left bundle branch block stable pattern  Recent Labs: 07/05/2019: NT-Pro BNP 128 08/17/2019: Hemoglobin 15.9; Platelets 135 11/28/2019: BUN 12; Creatinine, Ser 0.84; Magnesium 2.1; Potassium 4.6; Sodium 141  Recent Lipid Panel    Component Value Date/Time   CHOL 189 07/05/2019 0853   TRIG 156 (H) 07/05/2019 0853   HDL 43 07/05/2019 0853   CHOLHDL 4.4 07/05/2019 0853   LDLCALC 118 (H) 07/05/2019 0853    Physical Exam:    VS:  BP (!) 143/71   Pulse (!) 52   Ht 5\' 9"  (1.753 m)   Wt 251 lb 6.4 oz (114 kg)   SpO2 97%   BMI 37.13 kg/m     Wt Readings from Last 3 Encounters:  02/28/20 251 lb 6.4 oz (114 kg)  11/28/19 251 lb (113.9 kg)  08/26/19 254 lb (115.2 kg)     GEN:  Well nourished, well developed in no acute distress HEENT: Normal NECK: No JVD; No carotid bruits LYMPHATICS: No lymphadenopathy CARDIAC: 2 of 6 left ear murmur midsystolic does not encompass S2 or radiate to the carotids RRR, no murmurs, rubs, gallops RESPIRATORY:  Clear to auscultation without rales, wheezing or rhonchi  ABDOMEN: Soft, non-tender, non-distended MUSCULOSKELETAL:  No edema; No deformity  SKIN: Warm and dry NEUROLOGIC:  Alert and oriented x 3 PSYCHIATRIC:  Normal affect    Signed, 10/26/19, MD  02/28/2020 10:28 AM    [facility]

## 2020-02-28 ENCOUNTER — Encounter: Payer: Self-pay | Admitting: Cardiology

## 2020-02-28 ENCOUNTER — Other Ambulatory Visit: Payer: Self-pay

## 2020-02-28 ENCOUNTER — Ambulatory Visit: Payer: Medicare Other | Admitting: Cardiology

## 2020-02-28 VITALS — BP 143/71 | HR 52 | Ht 69.0 in | Wt 251.4 lb

## 2020-02-28 DIAGNOSIS — I350 Nonrheumatic aortic (valve) stenosis: Secondary | ICD-10-CM | POA: Diagnosis not present

## 2020-02-28 DIAGNOSIS — I25118 Atherosclerotic heart disease of native coronary artery with other forms of angina pectoris: Secondary | ICD-10-CM | POA: Diagnosis not present

## 2020-02-28 DIAGNOSIS — I447 Left bundle-branch block, unspecified: Secondary | ICD-10-CM

## 2020-02-28 DIAGNOSIS — I10 Essential (primary) hypertension: Secondary | ICD-10-CM | POA: Diagnosis not present

## 2020-02-28 DIAGNOSIS — E782 Mixed hyperlipidemia: Secondary | ICD-10-CM

## 2020-02-28 NOTE — Patient Instructions (Signed)

## 2020-03-06 DIAGNOSIS — N5201 Erectile dysfunction due to arterial insufficiency: Secondary | ICD-10-CM

## 2020-03-06 HISTORY — DX: Erectile dysfunction due to arterial insufficiency: N52.01

## 2020-06-18 ENCOUNTER — Encounter (HOSPITAL_BASED_OUTPATIENT_CLINIC_OR_DEPARTMENT_OTHER): Payer: Self-pay

## 2020-06-18 ENCOUNTER — Emergency Department (HOSPITAL_BASED_OUTPATIENT_CLINIC_OR_DEPARTMENT_OTHER)
Admission: EM | Admit: 2020-06-18 | Discharge: 2020-06-18 | Disposition: A | Discharge status: Home / Self Care | Payer: Medicare Other | Attending: Emergency Medicine | Admitting: Emergency Medicine

## 2020-06-18 ENCOUNTER — Other Ambulatory Visit: Payer: Self-pay

## 2020-06-18 ENCOUNTER — Emergency Department (HOSPITAL_BASED_OUTPATIENT_CLINIC_OR_DEPARTMENT_OTHER): Payer: Medicare Other

## 2020-06-18 DIAGNOSIS — Z7902 Long term (current) use of antithrombotics/antiplatelets: Secondary | ICD-10-CM | POA: Insufficient documentation

## 2020-06-18 DIAGNOSIS — Z87891 Personal history of nicotine dependence: Secondary | ICD-10-CM | POA: Diagnosis not present

## 2020-06-18 DIAGNOSIS — Z8673 Personal history of transient ischemic attack (TIA), and cerebral infarction without residual deficits: Secondary | ICD-10-CM | POA: Diagnosis not present

## 2020-06-18 DIAGNOSIS — Z7982 Long term (current) use of aspirin: Secondary | ICD-10-CM | POA: Diagnosis not present

## 2020-06-18 DIAGNOSIS — Z955 Presence of coronary angioplasty implant and graft: Secondary | ICD-10-CM | POA: Insufficient documentation

## 2020-06-18 DIAGNOSIS — M545 Low back pain, unspecified: Secondary | ICD-10-CM | POA: Diagnosis present

## 2020-06-18 DIAGNOSIS — I10 Essential (primary) hypertension: Secondary | ICD-10-CM | POA: Diagnosis not present

## 2020-06-18 DIAGNOSIS — M5441 Lumbago with sciatica, right side: Secondary | ICD-10-CM | POA: Diagnosis not present

## 2020-06-18 DIAGNOSIS — Z91040 Latex allergy status: Secondary | ICD-10-CM | POA: Insufficient documentation

## 2020-06-18 DIAGNOSIS — I2510 Atherosclerotic heart disease of native coronary artery without angina pectoris: Secondary | ICD-10-CM | POA: Insufficient documentation

## 2020-06-18 DIAGNOSIS — Z79899 Other long term (current) drug therapy: Secondary | ICD-10-CM | POA: Diagnosis not present

## 2020-06-18 MED ORDER — METHOCARBAMOL 500 MG PO TABS: 500.0000 mg | ORAL_TABLET | Freq: Two times a day (BID) | ORAL | 0 refills | Status: AC

## 2020-06-18 NOTE — ED Provider Notes (Signed)
[facility] EMERGENCY DEPARTMENT Provider Note   CSN: [id number] Arrival date & time: 06/18/20  1329     History Chief Complaint  Patient presents with  . Back Pain    Craig Lawson is a 75 y.o. male.  75 y.o male with a PMH of HT, CAD, Bradycardia presents to the ED with a chief complaint of back pain x 2 week.   The history is provided by the patient.  Back Pain Location:  Lumbar spine Quality:  Aching Radiates to:  R posterior upper leg Pain severity:  Mild Pain is:  Same all the time Onset quality:  Gradual Duration:  2 weeks Timing:  Constant Progression:  Worsening Chronicity:  New Context: lifting heavy objects and twisting   Context: not emotional stress, not falling, not MVA, not occupational injury, not recent illness and not recent injury   Relieved by:  Nothing Worsened by:  Bending Ineffective treatments:  None tried Associated symptoms: no chest pain, no fever, no numbness, no paresthesias, no perianal numbness, no tingling, no weakness and no weight loss   Risk factors: no hx of cancer, no hx of osteoporosis, no lack of exercise, no menopause, not pregnant and no recent surgery        Past Medical History:  Diagnosis Date  . Aortic stenosis, mild 08/10/2015  . Bilateral leg edema 08/22/2015  . Bradycardia 06/01/2017  . Cellulitis of left lower extremity 04/29/2017  . Coronary artery disease involving native coronary artery 10/22/2017  . DOE (dyspnea on exertion) 05/04/2017  . Essential hypertension 08/13/2015  . Heart murmur 04/29/2017  . History of transient ischemic attack (TIA) 10/30/2017  . Kissing, osteophytes 08/22/2015  . LBBB (left bundle branch block) 08/22/2015  . Lipid disorder 11/30/2017  . Lumbar facet joint pain 08/22/2015  . Medicare annual wellness visit, subsequent 06/02/2016  . Mild aortic regurgitation 08/10/2015  . Mild aortic stenosis 06/01/2017  . Morbid obesity (HCC) 10/22/2017  . Murmur 04/29/2017  . 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 Secondary to high freq statin use.  . Need for vaccination for Strep pneumoniae 03/27/2017  . Non-ST elevation myocardial infarction (NSTEMI), subendocardial infarction, subsequent episode of care (HCC) 11/30/2017  . Obesity, Class II, BMI 35-39.9 08/10/2015  . OSA on CPAP 12/02/2017  . Pure hypercholesterolemia 05/24/2015  . Seborrheic keratoses 03/27/2017   Overview:  Of the right forehead.  . Statin intolerance 11/30/2017  . Thrombocytopenia (HCC) 04/29/2017  . TIA (transient ischemic attack) 12/19/2016  . 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 Venous insufficiency 08/10/2015    Patient Active Problem List   Diagnosis Date Noted  . 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 CAD S/P percutaneous coronary angioplasty 07/28/2019  . Mixed hyperlipidemia 12/04/2017  . OSA on CPAP 12/02/2017  . Statin intolerance 11/30/2017  . Lipid disorder 11/30/2017  . Non-ST elevation myocardial infarction (NSTEMI), subendocardial infarction, subsequent episode of care (HCC) 11/30/2017  . History of transient ischemic attack (TIA) 10/30/2017  . Coronary artery disease involving native coronary artery 10/22/2017  . Morbid obesity (HCC) 10/22/2017  . Mild aortic stenosis 06/01/2017  . Bradycardia 06/01/2017  . DOE (dyspnea on exertion) 05/04/2017  . Murmur 04/29/2017  . Cellulitis of left lower extremity 04/29/2017  . Thrombocytopenia (HCC) 04/29/2017  . Heart murmur 04/29/2017  . Seborrheic keratoses 03/27/2017  . Need for vaccination for Strep pneumoniae 03/27/2017  . TIA (transient ischemic attack) 12/19/2016  . Medicare annual wellness visit, subsequent 06/02/2016  .  Bilateral leg edema 08/22/2015  . Kissing, osteophytes 08/22/2015  . LBBB (left bundle branch block) 08/22/2015  . Lumbar facet joint pain 08/22/2015  . 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 Essential hypertension 08/13/2015  . Mild aortic regurgitation 08/10/2015  . Obesity, Class II, BMI 35-39.9 08/10/2015   . Venous insufficiency 08/10/2015  . Aortic stenosis, mild 08/10/2015  . Pure hypercholesterolemia 05/24/2015    Past Surgical History:  Procedure Laterality Date  . CARDIAC CATHETERIZATION    . CHOLECYSTECTOMY    . CORONARY ANGIOPLASTY    . CORONARY ATHERECTOMY N/A 08/16/2019   Procedure: CORONARY ATHERECTOMY;  Surgeon: Corky Crafts, MD;  Location: [facility] INVASIVE CV LAB;  Service: Cardiovascular;  Laterality: N/A;  . CORONARY BALLOON ANGIOPLASTY N/A 07/28/2019   Procedure: CORONARY BALLOON ANGIOPLASTY;  Surgeon: Corky Crafts, MD;  Location: [facility] INVASIVE CV LAB;  Service: Cardiovascular;  Laterality: N/A;  . ESOPHAGOGASTRODUODENOSCOPY    . INTRAVASCULAR ULTRASOUND/IVUS N/A 07/28/2019   Procedure: Intravascular Ultrasound/IVUS;  Surgeon: Corky Crafts, MD;  Location: [facility] INVASIVE CV LAB;  Service: Cardiovascular;  Laterality: N/A;  . INTRAVASCULAR ULTRASOUND/IVUS N/A 08/16/2019   Procedure: Intravascular Ultrasound/IVUS;  Surgeon: Corky Crafts, MD;  Location: [facility] INVASIVE CV LAB;  Service: Cardiovascular;  Laterality: N/A;  . LEFT HEART CATH AND CORONARY ANGIOGRAPHY N/A 07/28/2019   Procedure: LEFT HEART CATH AND CORONARY ANGIOGRAPHY;  Surgeon: Corky Crafts, MD;  Location: [facility] INVASIVE CV LAB;  Service: Cardiovascular;  Laterality: N/A;  . LEFT HEART CATH AND CORONARY ANGIOGRAPHY N/A 08/16/2019   Procedure: LEFT HEART CATH AND CORONARY ANGIOGRAPHY;  Surgeon: Corky Crafts, MD;  Location: [facility] INVASIVE CV LAB;  Service: Cardiovascular;  Laterality: N/A;       Family History  Problem Relation Age of Onset  . Cancer Mother   . Diabetes Sister   . Diabetes Brother   . Hypertension Brother     Social History   Tobacco Use  . Smoking status: Former Smoker    Types: Cigarettes    Quit date: 1973    Years since quitting: 49.1  . Smokeless tobacco: Never Used  Vaping Use  . Vaping Use: Never used  Substance Use Topics  . Alcohol use: Yes     Alcohol/week: 7.0 standard drinks    Types: 7 Glasses of wine per week    Comment: per week  . Drug use: No    Home Medications Prior to Admission medications   Medication Sig Start Date End Date Taking? Authorizing Provider  methocarbamol (ROBAXIN) 500 MG tablet Take 1 tablet (500 mg total) by mouth 2 (two) times daily for 7 days. 06/18/20 06/25/20 Yes Claude Manges, PA-C  aspirin EC 81 MG tablet Take 81 mg by mouth at bedtime.    [provider]  clopidogrel (PLAVIX) 75 MG tablet TAKE 1 TABLET BY MOUTH AT  BEDTIME 02/01/20   Baldo Daub, MD  ezetimibe (ZETIA) 10 MG tablet Take 10 mg by mouth at bedtime.    [provider]  furosemide (LASIX) 20 MG tablet TAKE 1 TABLET BY MOUTH ON  TUESDAYS, THURSDAYS, AND  SATURDAYS AS DIRECTED Patient not taking: Reported on 02/28/2020 11/01/19   Tobb, Kardie, DO  hydrochlorothiazide (HYDRODIURIL) 25 MG tablet Take 25 mg by mouth at bedtime.  09/12/16   [provider]  losartan (COZAAR) 50 MG tablet Take 50 mg by mouth at bedtime.    [provider]  nitroGLYCERIN (NITROSTAT) 0.4 MG SL tablet Place 1 tablet (0.4 mg  total) under the tongue every 5 (five) minutes as needed for chest pain. 08/31/19   Tobb, Kardie, DO  potassium chloride SA (KLOR-CON) 20 MEQ tablet Take 1 tablet by mouth once daily 08/27/19   Tobb, Kardie, DO  pravastatin (PRAVACHOL) 20 MG tablet Take 1 tablet (20 mg total) by mouth 2 (two) times a week. Monday and Friday 08/08/19 02/28/20  Baldo Daub, MD  ranolazine (RANEXA) 1000 MG SR tablet Take 1 tablet (1,000 mg total) by mouth 2 (two) times daily. Patient taking differently: Take 1,000 mg by mouth daily.  11/28/19   Tobb, Kardie, DO    Allergies    Statins, Isosorbide, and Latex  Review of Systems   Review of Systems  Constitutional: Negative for fever and weight loss.  Cardiovascular: Negative for chest pain.  Musculoskeletal: Positive for back pain.  Neurological: Negative for tingling, weakness,  numbness and paresthesias.    Physical Exam Updated Vital Signs BP (!) 141/69 (BP Location: Right Arm)   Pulse (!) 55   Temp 98.4 F (36.9 C) (Oral)   Resp 19   Ht 5\' 9"  (1.753 m)   Wt 113.4 kg   SpO2 98%   BMI 36.92 kg/m   Physical Exam Vitals and nursing note reviewed.  Constitutional:      Appearance: Normal appearance.  HENT:     Head: Normocephalic and atraumatic.  Cardiovascular:     Rate and Rhythm: Normal rate.  Pulmonary:     Effort: Pulmonary effort is normal.  Abdominal:     General: Abdomen is flat.  Musculoskeletal:     Cervical back: Normal range of motion and neck supple.     Lumbar back: Tenderness present. No swelling, deformity, signs of trauma, lacerations or bony tenderness. Normal range of motion.       Back:  Skin:    General: Skin is warm and dry.  Neurological:     Mental Status: He is alert and oriented to person, place, and time.     ED Results / Procedures / Treatments   Labs (all labs ordered are listed, but only abnormal results are displayed) Labs Reviewed - No data to display  EKG None  Radiology DG Lumbar Spine Complete  Result Date: 06/18/2020 CLINICAL DATA:  Low back pain for several weeks, no known injury, initial encounter EXAM: LUMBAR SPINE - COMPLETE 4+ VIEW COMPARISON:  07/21/2014 FINDINGS: Five lumbar type vertebral bodies are well visualized. Vertebral body height is well maintained. Multilevel osteophytic changes are seen. No anterolisthesis is noted. Facet hypertrophic changes are seen as well. Diffuse aortic calcifications are noted without aneurysmal dilatation. IMPRESSION: Multilevel degenerative change which has progressed slightly in the interval from the prior exam. No acute abnormality noted. Electronically Signed   By: 09/20/2014 M.D.   On: 06/18/2020 14:35    Procedures Procedures   Medications Ordered in ED Medications - No data to display  ED Course  I have reviewed the triage vital signs and the  nursing notes.  Pertinent labs & imaging results that were available during my care of the patient were reviewed by me and considered in my medical decision making (see chart for details).    MDM Rules/Calculators/A&P   Patient with extensive medical history presents to the ED with a chief complaint of low back pain x2 weeks.  Pain is exacerbated with movement along with bending at the waist.  He is ambulatory in the ED with a steady gait.  During evaluation he is nontoxic, non-ill-appearing,  has not tried medication for improvement in symptoms.  No red flag such as IV drug use history, fever, bowel or bladder complaints aside from his "my usually urinary complaints". Afebrile in the ED. he is neurologically intact, does move both lower extremities, there is pain with palpation along the lumbar region on the right side, radiating down to his right leg.  Does feel like this is "nerve pain ", no prior history of diabetes on file, I suspect this is not peripheral. Exacerbated with movement along with ambulation.    X-ray of lumbar spine: Multilevel degenerative change which has progressed slightly in the  interval from the prior exam. No acute abnormality noted.   These results were discussed with patient at length, we will trial a short course of also relaxers to help with MSK pain at this time.  Patient shows and agrees with management, return precautions were discussed at length.  Portions of this note were generated with Scientist, clinical (histocompatibility and immunogenetics)Dragon dictation software. Dictation errors may occur despite best attempts at proofreading.  Final Clinical Impression(s) / ED Diagnoses Final diagnoses:  Acute right-sided low back pain with right-sided sciatica    Rx / DC Orders ED Discharge Orders         Ordered    methocarbamol (ROBAXIN) 500 MG tablet  2 times daily        06/18/20 1455           Claude MangesSoto, Johana, PA-C 06/18/20 1455    Virgina NorfolkCuratolo, Adam, DO 06/18/20 1514

## 2020-06-18 NOTE — Discharge Instructions (Addendum)
Your x-ray today showed a changes with likely degenerative disease.  I have prescribed a short course of muscle relaxers to help with your pain.  Please follow-up with your primary care physician as needed

## 2020-06-18 NOTE — ED Triage Notes (Signed)
Pt arrives with c/o back pain "for a few weeks" pt reports it got worse when bending to put his shoes on, in lower back

## 2020-10-06 ENCOUNTER — Other Ambulatory Visit: Payer: Self-pay | Admitting: Cardiology

## 2020-10-08 NOTE — Telephone Encounter (Signed)
Refill sent to pharmacy.   

## 2020-10-17 NOTE — Progress Notes (Signed)
Cardiology Office Note:    Date:  10/18/2020   ID:  Craig Lawson, DOB 1945-12-08, MRN [id number]  PCP:  Oneita Hurt, No  Cardiologist:  Norman Herrlich, MD    Referring MD: Tarri Fuller, MD    ASSESSMENT:    1. Coronary artery disease of native artery of native heart with stable angina pectoris (HCC)   2. Mild aortic stenosis   3. Essential hypertension   4. 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  Concerns regarding medicines are outlined above.  No orders of the defined types were placed in this encounter.  No orders of the defined types were placed in this encounter.   Chief Complaint  Patient presents with   Follow-up   Coronary Artery Disease    History of Present Illness:    Craig Lawson is a 75 y.o. male with a hx of CAD non-ST elevation MI 10/20/2018 with PCI and stent of the obtuse marginal branch and right coronary artery mild aortic stenosis and aortic regurgitation hypertension hyperlipidemia with poor statin intolerance and left bundle branch block 02/28/2020 last seen.  08/16/2019 he underwent left heart catheterization and PCI of the proximal and mid LAD with orbital atherectomy and drug-eluting stent.  On pullback he had a peak to peak gradient of 26 mmHg.  Echocardiogram 08/03/2019 showed mild aortic valve stenosis with peak and mean gradients of 33/20 mmHg valve area 1.1  cm. 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  The pattern is unchanged CCS 2.  He is on a good medical regimen including ranolazine he is not on a beta-blocker with resting sinus bradycardia continues on long-term dual antiplatelet therapy and lipid-lowering although he is very intolerant of statin and often has to stop taking with muscle pain and weakness.  He is very concerned about cost of medications and if we have samples in the office we will switch him to bempedoic acid Zetia for a month and recheck his lipids as his residual LDL is 134 and his current regimen.  No edema orthopnea or syncope.  Home heart rate runs in the range of 48 to 50 bpm Past Medical History:  Diagnosis Date   Aortic stenosis, mild 08/10/2015   Bilateral leg edema 08/22/2015   Bradycardia 06/01/2017   Cellulitis of left lower extremity 04/29/2017   Coronary artery disease involving native coronary artery 10/22/2017   DOE (dyspnea on exertion) 05/04/2017   Essential hypertension 08/13/2015   Heart murmur 04/29/2017   History of transient ischemic attack (TIA) 10/30/2017   Kissing, osteophytes 08/22/2015   LBBB (left bundle branch block) 08/22/2015   Lipid disorder 11/30/2017   Lumbar facet joint pain 08/22/2015   Medicare annual wellness visit, subsequent 06/02/2016  Mild aortic regurgitation 08/10/2015   Mild aortic stenosis 06/01/2017   Morbid obesity (HCC) 10/22/2017   Murmur 04/29/2017   Myopathy 09/02/2019   Formatting of this note might be different from the original. Secondary to high freq statin use.   Need for vaccination for Strep pneumoniae 03/27/2017   Non-ST elevation myocardial infarction (NSTEMI), subendocardial infarction, subsequent episode of care (HCC) 11/30/2017   Obesity, Class II, BMI 35-39.9 08/10/2015    OSA on CPAP 12/02/2017   Pure hypercholesterolemia 05/24/2015   Seborrheic keratoses 03/27/2017   Overview:  Of the right forehead.   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   Venous insufficiency 08/10/2015    Past Surgical History:  Procedure Laterality Date   CARDIAC CATHETERIZATION     CHOLECYSTECTOMY     CORONARY ANGIOPLASTY     CORONARY ATHERECTOMY N/A 08/16/2019   Procedure: CORONARY ATHERECTOMY;  Surgeon: Corky Crafts, MD;  Location: [facility] INVASIVE CV LAB;  Service: Cardiovascular;  Laterality: N/A;   CORONARY BALLOON ANGIOPLASTY N/A 07/28/2019   Procedure: CORONARY BALLOON ANGIOPLASTY;  Surgeon: Corky Crafts, MD;  Location: [facility] INVASIVE CV LAB;  Service: Cardiovascular;  Laterality: N/A;   ESOPHAGOGASTRODUODENOSCOPY     INTRAVASCULAR ULTRASOUND/IVUS N/A 07/28/2019   Procedure: Intravascular Ultrasound/IVUS;  Surgeon: Corky Crafts, MD;  Location: [facility] INVASIVE CV LAB;  Service: Cardiovascular;  Laterality: N/A;   INTRAVASCULAR ULTRASOUND/IVUS N/A 08/16/2019   Procedure: Intravascular Ultrasound/IVUS;  Surgeon: Corky Crafts, MD;  Location: [facility] INVASIVE CV LAB;  Service: Cardiovascular;  Laterality: N/A;   LEFT HEART CATH AND CORONARY ANGIOGRAPHY N/A 07/28/2019   Procedure: LEFT HEART CATH AND CORONARY ANGIOGRAPHY;  Surgeon: Corky Crafts, MD;  Location[facility] INVASIVE CV LAB;  Service: Cardiovascular;  Laterality: N/A;   LEFT HEART CATH AND CORONARY ANGIOGRAPHY N/A 08/16/2019   Procedure: LEFT HEART CATH AND CORONARY ANGIOGRAPHY;  Surgeon: Corky Crafts, MD;  Location: [facility] INVASIVE CV LAB;  Service: Cardiovascular;  Laterality: N/A;    Current Medications: Current Meds  Medication Sig   aspirin EC 81 MG tablet Take 81 mg by mouth at bedtime.   clopidogrel (PLAVIX) 75 MG tablet TAKE 1 TABLET BY MOUTH AT  BEDTIME   ezetimibe (ZETIA) 10 MG tablet Take 10 mg  by mouth at bedtime.   hydrochlorothiazide (HYDRODIURIL) 25 MG tablet Take 25 mg by mouth at bedtime.    losartan (COZAAR) 50 MG tablet Take 50 mg by mouth at bedtime.   nitroGLYCERIN (NITROSTAT) 0.4 MG SL tablet Place 1 tablet (0.4 mg total) under the tongue every 5 (five) minutes as needed for chest pain.   pravastatin (PRAVACHOL) 20 MG tablet Take 1 tablet (20 mg total) by mouth 2 (two) times a week. Monday and Friday   sildenafil (VIAGRA) 50 MG tablet Take 50 mg by mouth daily as needed for erectile dysfunction.     Allergies:   Statins, Isosorbide, and Latex   Social History   Socioeconomic History   Marital status: Married    Spouse name: Not on file   Number of children: Not on file   Years of education: Not on file   Highest education level: Not on file  Occupational History   Not on file  Tobacco Use   Smoking status: Former    Pack years: 0.00    Types: Cigarettes    Quit date: 25    Years since quitting: 73.5  Smokeless tobacco: Never  Vaping Use   Vaping Use: Never used  Substance and Sexual Activity   Alcohol use: Yes    Alcohol/week: 7.0 standard drinks    Types: 7 Glasses of wine per week    Comment: per week   Drug use: No   Sexual activity: Not on file  Other Topics Concern   Not on file  Social History Narrative   Not on file   Social Determinants of Health   Financial Resource Strain: Not on file  Food Insecurity: Not on file  Transportation Needs: Not on file  Physical Activity: Not on file  Stress: Not on file  Social Connections: Not on file     Family History: The patient's family history includes Cancer in his mother; Diabetes in his brother and sister; Hypertension in his brother. ROS:   Please see the history of present illness.    All other systems reviewed and are negative.  EKGs/Labs/Other Studies Reviewed:    The following studies were reviewed today:    Recent Labs: 11/28/2019: BUN 12; Creatinine, Ser 0.84; Magnesium 2.1;  Potassium 4.6; Sodium 141  Recent Lipid Panel    Component Value Date/Time   CHOL 189 07/05/2019 0853   TRIG 156 (H) 07/05/2019 0853   HDL 43 07/05/2019 0853   CHOLHDL 4.4 07/05/2019 0853   LDLCALC 118 (H) 07/05/2019 0853    Physical Exam:    VS:  BP 130/70 (BP Location: Right Arm, Patient Position: Sitting, Cuff Size: Normal)   Pulse (!) 48   Ht 5\' 9"  (1.753 m)   Wt 258 lb (117 kg)   SpO2 94%   BMI 38.10 kg/m     Wt Readings from Last 3 Encounters:  10/18/20 258 lb (117 kg)  06/18/20 250 lb (113.4 kg)  02/28/20 251 lb 6.4 oz (114 kg)     GEN:  Well nourished, well developed in no acute distress HEENT: Normal NECK: No JVD; No carotid bruits LYMPHATICS: No lymphadenopathy CARDIAC: Grade 2/6 midsystolic ejection murmur does not radiate to the carotids or encompass S2 RRR, no murmurs, rubs, gallops RESPIRATORY:  Clear to auscultation without rales, wheezing or rhonchi  ABDOMEN: Soft, non-tender, non-distended MUSCULOSKELETAL:  No edema; No deformity  SKIN: Warm and dry NEUROLOGIC:  Alert and oriented x 3 PSYCHIATRIC:  Normal affect    Signed, 13/09/21, MD  10/18/2020 9:32 AM    [facility]

## 2020-10-18 ENCOUNTER — Other Ambulatory Visit: Payer: Self-pay

## 2020-10-18 ENCOUNTER — Encounter: Payer: Self-pay | Admitting: Cardiology

## 2020-10-18 ENCOUNTER — Ambulatory Visit: Payer: Medicare Other | Admitting: Cardiology

## 2020-10-18 VITALS — BP 130/70 | HR 48 | Ht 69.0 in | Wt 258.0 lb

## 2020-10-18 DIAGNOSIS — E782 Mixed hyperlipidemia: Secondary | ICD-10-CM | POA: Diagnosis not present

## 2020-10-18 DIAGNOSIS — I10 Essential (primary) hypertension: Secondary | ICD-10-CM

## 2020-10-18 DIAGNOSIS — I350 Nonrheumatic aortic (valve) stenosis: Secondary | ICD-10-CM | POA: Diagnosis not present

## 2020-10-18 DIAGNOSIS — I25118 Atherosclerotic heart disease of native coronary artery with other forms of angina pectoris: Secondary | ICD-10-CM

## 2020-10-18 NOTE — Patient Instructions (Signed)
Medication Instructions:  Your physician has recommended you make the following change in your medication:  START: Nexlitol 180/10 mg take one tablet by mouth daily in the evening.  *If you need a refill on your cardiac medications before your next appointment, please call your pharmacy*   Lab Work: Your physician recommends that you return for lab work in: 1 month  Lipids If you have labs (blood work) drawn today and your tests are completely normal, you will receive your results only by: MyChart Message (if you have MyChart) OR A paper copy in the mail If you have any lab test that is abnormal or we need to change your treatment, we will call you to review the results.   Testing/Procedures: None   Follow-Up: At [facility], you and your health needs are our priority.  As part of our continuing mission to provide you with exceptional heart care, we have created designated Provider Care Teams.  These Care Teams include your primary Cardiologist (physician) and Advanced Practice Providers (APPs -  Physician Assistants and Nurse Practitioners) who all work together to provide you with the care you need, when you need it.  We recommend signing up for the patient portal called "MyChart".  Sign up information is provided on this After Visit Summary.  MyChart is used to connect with patients for Virtual Visits (Telemedicine).  Patients are able to view lab/test results, encounter notes, upcoming appointments, etc.  Non-urgent messages can be sent to your provider as well.   To learn more about what you can do with MyChart, go to [email or web address].    Your next appointment:   6 month(s)  The format for your next appointment:   In Person  Provider:   Norman Herrlich, MD   Other Instructions

## 2021-01-04 ENCOUNTER — Other Ambulatory Visit: Payer: Self-pay | Admitting: Cardiology

## 2021-05-12 NOTE — Progress Notes (Signed)
Cardiology Office Note:    Date:  05/13/2021   Craig Lawson, DOB 04/12/1946, MRN [id number]  PCP:  Pcp, No  Cardiologist:  Norman HerrlichBrian Sherial Ebrahim, MD    Referring MD: No ref. provider found    ASSESSMENT:    1. Coronary artery disease of native artery of native heart with stable angina pectoris (HCC)   2. Mild aortic stenosis   3. LBBB (left bundle branch block)   4. Essential hypertension   5. Mixed hyperlipidemia    PLAN:    In order of problems listed above:  Stable CAD shortness of breath may be anginal equivalent pattern is stable continue medical treatment including clopidogrel lipid-lowering with a low intensity statin and Zetia he did not tolerate high intensity on a beta-blocker with resting bradycardia nitroglycerin if needed.  We discussed the potential for further evaluation but if I chose a test in his case I would choose coronary angiography and we think the symptoms are stable and not lifestyle disruptive. Recheck echocardiogram for progression of aortic stenosis Stable EKG pattern left bundle branch block sinus bradycardia BP at target continue low-dose ARB Continue his combined low intensity statin 2 days a week and Zetia.   Next appointment: 6 months    Medication Adjustments/Labs and Tests Ordered: Current medicines are reviewed at length with the patient today.  Concerns regarding medicines are outlined above.  Orders Placed This Encounter  Procedures   EKG 12-Lead   ECHOCARDIOGRAM COMPLETE   Meds ordered this encounter  Medications   nitroGLYCERIN (NITROSTAT) 0.4 MG SL tablet    Sig: Place 1 tablet (0.4 mg total) under the tongue every 5 (five) minutes as needed for chest pain.    Dispense:  30 tablet    Refill:  2    Chief Complaint  Patient presents with   Follow-up   Coronary Artery Disease   Aortic Stenosis    History of Present Illness:    Craig Lawson is a 76 y.o. male with a hx of CAD non-ST elevation MI in 10/20/2018 with PCI and stent  to the obtuse marginal branch and right coronary artery, subsequent PCI and stent to proximal and mid LAD with orbital atherectomy and drug-eluting stents for 08/16/2019 mild aortic stenosis and regurgitation hypertensive heart disease left bundle branch block and hyperlipidemia poor statin intolerance last seen 10/18/2020 with stable CAD.  He is not on beta-blocker due to resting sinus bradycardia.  Left heart catheterization April 2021 his aortic stenosis was mild with peak gradient 26 mmHg and echo gradient of 33 and 20 mmHg peak and mean   compliance with diet, lifestyle and medications: Yes  He has stable exertional symptoms of shortness of breath that happened continues to have back strain that he spontaneously but also at times as he begins his walking he can walk through it and only happens with more than usual activities but not frequently he has not needed nitroglycerin.  He has had no chest pain edema orthopnea palpitation or syncope.  He is concerned about progression of disease valve disease and is coming up on 2 years from his last echo He tolerates his statin without muscle pain or weakness He has not had COVID-19 Past Medical History:  Diagnosis Date   Aortic stenosis, mild 08/10/2015   Bilateral leg edema 08/22/2015   Bradycardia 06/01/2017   Cellulitis of left lower extremity 04/29/2017   Coronary artery disease involving native coronary artery 10/22/2017   DOE (dyspnea on exertion) 05/04/2017  Essential hypertension 08/13/2015   Heart murmur 04/29/2017   History of transient ischemic attack (TIA) 10/30/2017   Kissing, osteophytes 08/22/2015   LBBB (left bundle branch block) 08/22/2015   Lipid disorder 11/30/2017   Lumbar facet joint pain 08/22/2015   Medicare annual wellness visit, subsequent 06/02/2016   Mild aortic regurgitation 08/10/2015   Mild aortic stenosis 06/01/2017   Morbid obesity (HCC) 10/22/2017   Murmur 04/29/2017   Myopathy 09/02/2019   Formatting of this note might be different  from the original. Secondary to high freq statin use.   Need for vaccination for Strep pneumoniae 03/27/2017   Non-ST elevation myocardial infarction (NSTEMI), subendocardial infarction, subsequent episode of care (HCC) 11/30/2017   Obesity, Class II, BMI 35-39.9 08/10/2015   OSA on CPAP 12/02/2017   Pure hypercholesterolemia 05/24/2015   Seborrheic keratoses 03/27/2017   Overview:  Of the right forehead.   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   Venous insufficiency 08/10/2015    Past Surgical History:  Procedure Laterality Date   CARDIAC CATHETERIZATION     CHOLECYSTECTOMY     CORONARY ANGIOPLASTY     CORONARY ATHERECTOMY N/A 08/16/2019   Procedure: CORONARY ATHERECTOMY;  Surgeon: Corky Crafts, MD;  Location: [facility] INVASIVE CV LAB;  Service: Cardiovascular;  Laterality: N/A;   CORONARY BALLOON ANGIOPLASTY N/A 07/28/2019   Procedure: CORONARY BALLOON ANGIOPLASTY;  Surgeon: Corky Crafts, MD;  Location: [facility] INVASIVE CV LAB;  Service: Cardiovascular;  Laterality: N/A;   ESOPHAGOGASTRODUODENOSCOPY     INTRAVASCULAR ULTRASOUND/IVUS N/A 07/28/2019   Procedure: Intravascular Ultrasound/IVUS;  Surgeon: Corky Crafts, MD;  Location: [facility] INVASIVE CV LAB;  Service: Cardiovascular;  Laterality: N/A;   INTRAVASCULAR ULTRASOUND/IVUS N/A 08/16/2019   Procedure: Intravascular Ultrasound/IVUS;  Surgeon: Corky Crafts, MD;  Location: [facility] INVASIVE CV LAB;  Service: Cardiovascular;  Laterality: N/A;   LEFT HEART CATH AND CORONARY ANGIOGRAPHY N/A 07/28/2019   Procedure: LEFT HEART CATH AND CORONARY ANGIOGRAPHY;  Surgeon: Corky Crafts, MD;  Location: [facility] INVASIVE CV LAB;  Service: Cardiovascular;  Laterality: N/A;   LEFT HEART CATH AND CORONARY ANGIOGRAPHY N/A 08/16/2019   Procedure: LEFT HEART CATH AND CORONARY ANGIOGRAPHY;  Surgeon: Corky Crafts, MD;  Location: [facility] INVASIVE CV LAB;   Service: Cardiovascular;  Laterality: N/A;    Current Medications: Current Meds  Medication Sig   aspirin EC 81 MG tablet Take 81 mg by mouth at bedtime.   clopidogrel (PLAVIX) 75 MG tablet TAKE 1 TABLET BY MOUTH AT  BEDTIME   ezetimibe (ZETIA) 10 MG tablet Take 10 mg by mouth at bedtime.   hydrochlorothiazide (HYDRODIURIL) 25 MG tablet Take 25 mg by mouth at bedtime.    losartan (COZAAR) 50 MG tablet Take 50 mg by mouth at bedtime.   pravastatin (PRAVACHOL) 20 MG tablet Take 1 tablet (20 mg total) by mouth 2 (two) times a week. Monday and Friday   sildenafil (VIAGRA) 50 MG tablet Take 50 mg by mouth daily as needed for erectile dysfunction.   [DISCONTINUED] nitroGLYCERIN (NITROSTAT) 0.4 MG SL tablet Place 1 tablet (0.4 mg total) under the tongue every 5 (five) minutes as needed for chest pain.     Allergies:   Statins, Isosorbide, and Latex   Social History   Socioeconomic History   Marital status: Married    Spouse name: Not on file   Number of children: Not on file   Years of education:  Not on file   Highest education level: Not on file  Occupational History   Not on file  Tobacco Use   Smoking status: Former    Types: Cigarettes    Quit date: 86    Years since quitting: 50.0    Passive exposure: Past   Smokeless tobacco: Never  Vaping Use   Vaping Use: Never used  Substance and Sexual Activity   Alcohol use: Yes    Alcohol/week: 7.0 standard drinks    Types: 7 Glasses of wine per week    Comment: per week   Drug use: No   Sexual activity: Not on file  Other Topics Concern   Not on file  Social History Narrative   Not on file   Social Determinants of Health   Financial Resource Strain: Not on file  Food Insecurity: Not on file  Transportation Needs: Not on file  Physical Activity: Not on file  Stress: Not on file  Social Connections: Not on file     Family History: The patient's family history includes Cancer in his mother; Diabetes in his brother and  sister; Hypertension in his brother. ROS:   Please see the history of present illness.    All other systems reviewed and are negative.  EKGs/Labs/Other Studies Reviewed:    The following studies were reviewed today:  EKG:  EKG ordered today and personally reviewed.  The ekg ordered today demonstrates sinus rhythm bradycardia 50 bpm left bundle branch block unchanged  Recent Labs: 04/03/2021: CMP creatinine 1.10 normal liver function tests potassium 4.3 TSH mildly elevated at 6.4 Cholesterol 1 7 LDL 110 triglycerides 220 HDL 37  Recent Lipid Panel    Component Value Date/Time   CHOL 189 07/05/2019 0853   TRIG 156 (H) 07/05/2019 0853   HDL 43 07/05/2019 0853   CHOLHDL 4.4 07/05/2019 0853   LDLCALC 118 (H) 07/05/2019 0853    Physical Exam:    VS:  BP 134/70 (BP Location: Right Arm)    Pulse (!) 50    Ht 5\' 9"  (1.753 m)    Wt 261 lb (118.4 kg)    SpO2 95%    BMI 38.54 kg/m     Wt Readings from Last 3 Encounters:  05/13/21 261 lb (118.4 kg)  10/18/20 258 lb (117 kg)  06/18/20 250 lb (113.4 kg)     GEN:  Well nourished, well developed in no acute distress HEENT: Normal NECK: No JVD; No carotid bruits LYMPHATICS: No lymphadenopathy CARDIAC: 2/6 systolic ejection murmur does not encompass S2 does not radiate to the carotids no AR RRR, no murmurs, rubs, gallops RESPIRATORY:  Clear to auscultation without rales, wheezing or rhonchi  ABDOMEN: Soft, non-tender, non-distended MUSCULOSKELETAL:  No edema; No deformity  SKIN: Warm and dry NEUROLOGIC:  Alert and oriented x 3 PSYCHIATRIC:  Normal affect    Signed, 06/20/20, MD  05/13/2021 9:47 AM    [facility]

## 2021-05-13 ENCOUNTER — Other Ambulatory Visit: Payer: Self-pay

## 2021-05-13 ENCOUNTER — Ambulatory Visit: Payer: Medicare Other | Admitting: Cardiology

## 2021-05-13 ENCOUNTER — Encounter: Payer: Self-pay | Admitting: Cardiology

## 2021-05-13 VITALS — BP 134/70 | HR 50 | Ht 69.0 in | Wt 261.0 lb

## 2021-05-13 DIAGNOSIS — I25118 Atherosclerotic heart disease of native coronary artery with other forms of angina pectoris: Secondary | ICD-10-CM

## 2021-05-13 DIAGNOSIS — I447 Left bundle-branch block, unspecified: Secondary | ICD-10-CM | POA: Diagnosis not present

## 2021-05-13 DIAGNOSIS — I350 Nonrheumatic aortic (valve) stenosis: Secondary | ICD-10-CM

## 2021-05-13 DIAGNOSIS — I10 Essential (primary) hypertension: Secondary | ICD-10-CM | POA: Diagnosis not present

## 2021-05-13 DIAGNOSIS — E782 Mixed hyperlipidemia: Secondary | ICD-10-CM

## 2021-05-13 MED ORDER — NITROGLYCERIN 0.4 MG SL SUBL: 0.4000 mg | SUBLINGUAL_TABLET | SUBLINGUAL | 2 refills | Status: DC | PRN

## 2021-05-13 NOTE — Patient Instructions (Signed)

## 2021-05-20 ENCOUNTER — Ambulatory Visit (INDEPENDENT_AMBULATORY_CARE_PROVIDER_SITE_OTHER): Payer: Medicare Other

## 2021-05-20 ENCOUNTER — Other Ambulatory Visit: Payer: Self-pay

## 2021-05-20 DIAGNOSIS — I350 Nonrheumatic aortic (valve) stenosis: Secondary | ICD-10-CM | POA: Diagnosis not present

## 2021-05-20 LAB — ECHOCARDIOGRAM COMPLETE
AR max vel: 1.19 cm2
AV Area VTI: 1.1 cm2
AV Area mean vel: 1.11 cm2
AV Mean grad: 31.7 mmHg
AV Peak grad: 49.6 mmHg
Ao pk vel: 3.52 m/s
Area-P 1/2: 3.48 cm2
S' Lateral: 4.3 cm

## 2021-05-21 ENCOUNTER — Telehealth: Payer: Self-pay

## 2021-05-21 NOTE — Telephone Encounter (Signed)
Spoke with patient regarding results and recommendation.  Patient verbalizes understanding and is agreeable to plan of care. Advised patient to call back with any issues or concerns.  

## 2021-05-21 NOTE — Telephone Encounter (Signed)
-----   Message from Richardo Priest, MD sent at 05/21/2021 11:05 AM EST ----- There has been mild progression in the aortic valve its moderate not severe we will plan to do another echocardiogram in 1 year

## 2021-09-03 ENCOUNTER — Other Ambulatory Visit: Payer: Self-pay | Admitting: Cardiology

## 2021-09-27 DIAGNOSIS — E559 Vitamin D deficiency, unspecified: Secondary | ICD-10-CM

## 2021-09-27 HISTORY — DX: Vitamin D deficiency, unspecified: E55.9

## 2021-10-02 ENCOUNTER — Other Ambulatory Visit: Payer: Self-pay | Admitting: Cardiology

## 2021-10-03 ENCOUNTER — Other Ambulatory Visit: Payer: Self-pay | Admitting: Cardiology

## 2021-11-10 NOTE — Progress Notes (Signed)
Cardiology Office Note:    Date:  11/11/2021   ID:  Craig Lawson, DOB [birth year], MRN [id number]  PCP:  Montez Hageman, DO  Cardiologist:  Norman Herrlich, MD    Referring MD: Montez Hageman, DO    ASSESSMENT:    1. Coronary artery disease of native artery of native heart with stable angina pectoris (HCC)   2. Nonrheumatic aortic valve stenosis   3. LBBB (left bundle branch block)   4. Essential hypertension   5. Mixed hyperlipidemia    PLAN:    In order of problems listed above:  Stable CAD offered to intensify medical therapy Continue his dual antiplatelet and lipid-lowering pravastatin Recheck echocardiogram January regarding severe aortic stenosis be a trigger for intervention TAVR At target continue to ARB diuretic Continues maximally tolerated statin and Zetia   Next appointment: 9 months   Medication Adjustments/Labs and Tests Ordered: Current medicines are reviewed at length with the patient today.  Concerns regarding medicines are outlined above.  Orders Placed This Encounter  Procedures   ECHOCARDIOGRAM COMPLETE   No orders of the defined types were placed in this encounter.   Chief Complaint  Patient presents with   Follow-up   Aortic Stenosis   Coronary Artery Disease   Hyperlipidemia    History of Present Illness:    Craig Lawson is a 76 y.o. male with a hx of CAD non-ST elevation MI in 10/20/2018 with PCI and stent to the obtuse marginal branch and right coronary artery, subsequent PCI and stent to proximal and mid LAD with orbital atherectomy and drug-eluting stents for 08/16/2019 aortic stenosis and regurgitation hypertensive heart disease left bundle branch block and hyperlipidemia poor statin intolerance  last seen 05/13/2021. Compliance with diet, lifestyle and medications: Yes  An echocardiogram performed 05/20/2021 showed the aortic valve to be moderately calcified with moderate aortic stenosis peak and mean gradients 47 and 32 mmHg VTI  ratio 0.35.  Previous echocardiogram in April 2021 with a mean gradient of 20 mmHg.  Recent labs 09/27/2021: TSH normal 3.89 potassium 4.3 creatinine 1.10 GFR 70 cc 04/03/2021 cholesterol 170 LDL 110 triglycerides 220 HDL 46  We discussed using bempedoic acid he declines in time to time asked to stop taking his statin because of muscle and joint pain He has used 1 nitroglycerin since his last visit he had typical angina doing very heavy vigorous gardening work He has a stable pattern of infrequent angina relieved with rest He has edema and takes loop diuretic 3 days a week No orthopnea palpitation or syncope Past Medical History:  Diagnosis Date   Aortic stenosis, mild 08/10/2015   Bilateral leg edema 08/22/2015   Bradycardia 06/01/2017   Cellulitis of left lower extremity 04/29/2017   Coronary artery disease involving native coronary artery 10/22/2017   DOE (dyspnea on exertion) 05/04/2017   Essential hypertension 08/13/2015   Heart murmur 04/29/2017   History of transient ischemic attack (TIA) 10/30/2017   Kissing, osteophytes 08/22/2015   LBBB (left bundle branch block) 08/22/2015   Lipid disorder 11/30/2017   Lumbar facet joint pain 08/22/2015   Medicare annual wellness visit, subsequent 06/02/2016   Mild aortic regurgitation 08/10/2015   Mild aortic stenosis 06/01/2017   Morbid obesity (HCC) 10/22/2017   Murmur 04/29/2017   Myopathy 09/02/2019   Formatting of this note might be different from the original. Secondary to high freq statin use.   Need for vaccination for Strep pneumoniae 03/27/2017   Non-ST elevation myocardial infarction (NSTEMI), subendocardial infarction,  subsequent episode of care (HCC) 11/30/2017   Obesity, Class II, BMI 35-39.9 08/10/2015   OSA on CPAP 12/02/2017   Pure hypercholesterolemia 05/24/2015   Seborrheic keratoses 03/27/2017   Overview:  Of the right forehead.   Statin intolerance 11/30/2017   Thrombocytopenia (HCC) 04/29/2017   TIA (transient ischemic attack) 12/19/2016    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   Venous insufficiency 08/10/2015    Past Surgical History:  Procedure Laterality Date   CARDIAC CATHETERIZATION     CHOLECYSTECTOMY     CORONARY ANGIOPLASTY     CORONARY ATHERECTOMY N/A 08/16/2019   Procedure: CORONARY ATHERECTOMY;  Surgeon: Corky Crafts, MD;  Location: [facility] INVASIVE CV LAB;  Service: Cardiovascular;  Laterality: N/A;   CORONARY BALLOON ANGIOPLASTY N/A 07/28/2019   Procedure: CORONARY BALLOON ANGIOPLASTY;  Surgeon: Corky Crafts, MD;  Location: [facility] INVASIVE CV LAB;  Service: Cardiovascular;  Laterality: N/A;   ESOPHAGOGASTRODUODENOSCOPY     INTRAVASCULAR ULTRASOUND/IVUS N/A 07/28/2019   Procedure: Intravascular Ultrasound/IVUS;  Surgeon: Corky Crafts, MD;  Location: [facility] INVASIVE CV LAB;  Service: Cardiovascular;  Laterality: N/A;   INTRAVASCULAR ULTRASOUND/IVUS N/A 08/16/2019   Procedure: Intravascular Ultrasound/IVUS;  Surgeon: Corky Crafts, MD;  Location: [facility] INVASIVE CV LAB;  Service: Cardiovascular;  Laterality: N/A;   LEFT HEART CATH AND CORONARY ANGIOGRAPHY N/A 07/28/2019   Procedure: LEFT HEART CATH AND CORONARY ANGIOGRAPHY;  Surgeon: Corky Crafts, MD;  Location: [facility] INVASIVE CV LAB;  Service: Cardiovascular;  Laterality: N/A;   LEFT HEART CATH AND CORONARY ANGIOGRAPHY N/A 08/16/2019   Procedure: LEFT HEART CATH AND CORONARY ANGIOGRAPHY;  Surgeon: Corky Crafts, MD;  Location: [facility] INVASIVE CV LAB;  Service: Cardiovascular;  Laterality: N/A;    Current Medications: Current Meds  Medication Sig   aspirin EC 81 MG tablet Take 81 mg by mouth at bedtime.   Cholecalciferol 125 MCG (5000 UT) TABS Take 1 tablet by mouth daily.   clopidogrel (PLAVIX) 75 MG tablet Take 1 tablet (75 mg total) by mouth at bedtime.   ezetimibe (ZETIA) 10 MG tablet Take 10 mg by mouth at bedtime.   furosemide (LASIX) 20 MG tablet Take 20 mg by mouth 3 (three) times a week.   hydrochlorothiazide (HYDRODIURIL) 25 MG tablet Take 25 mg  by mouth at bedtime.    losartan (COZAAR) 50 MG tablet Take 50 mg by mouth at bedtime.   nitroGLYCERIN (NITROSTAT) 0.4 MG SL tablet Place 1 tablet (0.4 mg total) under the tongue every 5 (five) minutes as needed for chest pain.   pravastatin (PRAVACHOL) 20 MG tablet Take 1 tablet (20 mg total) by mouth 2 (two) times a week. Monday and Friday   sildenafil (VIAGRA) 50 MG tablet Take 50 mg by mouth daily as needed for erectile dysfunction.     Allergies:   Statins, Isosorbide, and Latex   Social History   Socioeconomic History   Marital status: Married    Spouse name: Not on file   Number of children: Not on file   Years of education: Not on file   Highest education level: Not on file  Occupational History   Not on file  Tobacco Use   Smoking status: Former    Types: Cigarettes    Quit date: 32    Years since quitting: 50.5    Passive exposure: Past   Smokeless tobacco: Never  Vaping Use   Vaping Use: Never used  Substance and Sexual Activity   Alcohol use: Yes    Alcohol/week: 7.0  standard drinks of alcohol    Types: 7 Glasses of wine per week    Comment: per week   Drug use: No   Sexual activity: Not on file  Other Topics Concern   Not on file  Social History Narrative   Not on file   Social Determinants of Health   Financial Resource Strain: Not on file  Food Insecurity: Not on file  Transportation Needs: Not on file  Physical Activity: Not on file  Stress: Not on file  Social Connections: Not on file     Family History: The patient's family history includes Cancer in his mother; Diabetes in his brother and sister; Hypertension in his brother. ROS:   Please see the history of present illness.    All other systems reviewed and are negative.  EKGs/Labs/Other Studies Reviewed:    The following studies were reviewed today: See history   Physical Exam:    VS:  BP 124/70 (BP Location: Left Arm, Patient Position: Sitting, Cuff Size: Normal)   Pulse (!) 52    Ht 5\' 9"  (1.753 m)   Wt 265 lb (120.2 kg)   SpO2 93%   BMI 39.13 kg/m     Wt Readings from Last 3 Encounters:  11/11/21 265 lb (120.2 kg)  05/13/21 261 lb (118.4 kg)  10/18/20 258 lb (117 kg)     GEN:  Well nourished, well developed in no acute distress HEENT: Normal NECK: No JVD; No carotid bruits LYMPHATICS: No lymphadenopathy CARDIAC: RRR, grade 2/6 murmur aortic stenosis radiates to the right clavicle S2 still splits no AR rubs, gallops RESPIRATORY:  Clear to auscultation without rales, wheezing or rhonchi  ABDOMEN: Soft, non-tender, non-distended MUSCULOSKELETAL:  No edema; No deformity  SKIN: Warm and dry NEUROLOGIC:  Alert and oriented x 3 PSYCHIATRIC:  Normal affect    Signed, 10/20/20, MD  11/11/2021 8:56 AM    [facility]

## 2021-11-11 ENCOUNTER — Encounter: Payer: Self-pay | Admitting: Cardiology

## 2021-11-11 ENCOUNTER — Ambulatory Visit: Payer: Medicare Other | Admitting: Cardiology

## 2021-11-11 VITALS — BP 124/70 | HR 52 | Ht 69.0 in | Wt 265.0 lb

## 2021-11-11 DIAGNOSIS — I447 Left bundle-branch block, unspecified: Secondary | ICD-10-CM | POA: Diagnosis not present

## 2021-11-11 DIAGNOSIS — I10 Essential (primary) hypertension: Secondary | ICD-10-CM | POA: Diagnosis not present

## 2021-11-11 DIAGNOSIS — I25118 Atherosclerotic heart disease of native coronary artery with other forms of angina pectoris: Secondary | ICD-10-CM

## 2021-11-11 DIAGNOSIS — E782 Mixed hyperlipidemia: Secondary | ICD-10-CM

## 2021-11-11 DIAGNOSIS — I350 Nonrheumatic aortic (valve) stenosis: Secondary | ICD-10-CM

## 2021-11-11 NOTE — Patient Instructions (Signed)
Medication Instructions:  Your physician recommends that you continue on your current medications as directed. Please refer to the Current Medication list given to you today.  *If you need a refill on your cardiac medications before your next appointment, please call your pharmacy*   Lab Work: None If you have labs (blood work) drawn today and your tests are completely normal, you will receive your results only by: MyChart Message (if you have MyChart) OR A paper copy in the mail If you have any lab test that is abnormal or we need to change your treatment, we will call you to review the results.   Testing/Procedures: Your physician has requested that you have an echocardiogram. Echocardiography is a painless test that uses sound waves to create images of your heart. It provides your doctor with information about the size and shape of your heart and how well your heart's chambers and valves are working. This procedure takes approximately one hour. There are no restrictions for this procedure.    Follow-Up: At [facility], you and your health needs are our priority.  As part of our continuing mission to provide you with exceptional heart care, we have created designated Provider Care Teams.  These Care Teams include your primary Cardiologist (physician) and Advanced Practice Providers (APPs -  Physician Assistants and Nurse Practitioners) who all work together to provide you with the care you need, when you need it.  We recommend signing up for the patient portal called "MyChart".  Sign up information is provided on this After Visit Summary.  MyChart is used to connect with patients for Virtual Visits (Telemedicine).  Patients are able to view lab/test results, encounter notes, upcoming appointments, etc.  Non-urgent messages can be sent to your provider as well.   To learn more about what you can do with MyChart, go to [email or web address].    Your next appointment:   9  month(s)  The format for your next appointment:   In Person  Provider:   Brian Munley, MD    Other Instructions None  Important Information About Sugar       

## 2021-12-02 IMAGING — CR DG LUMBAR SPINE COMPLETE 4+V
5 series · 5 of 5 positions shown · non-contrast
Comparison: 07/21/2014

CLINICAL DATA: Low back pain for several weeks, no known injury,
initial encounter

EXAM:
LUMBAR SPINE - COMPLETE 4+ VIEW

[t l-spine a.p.]
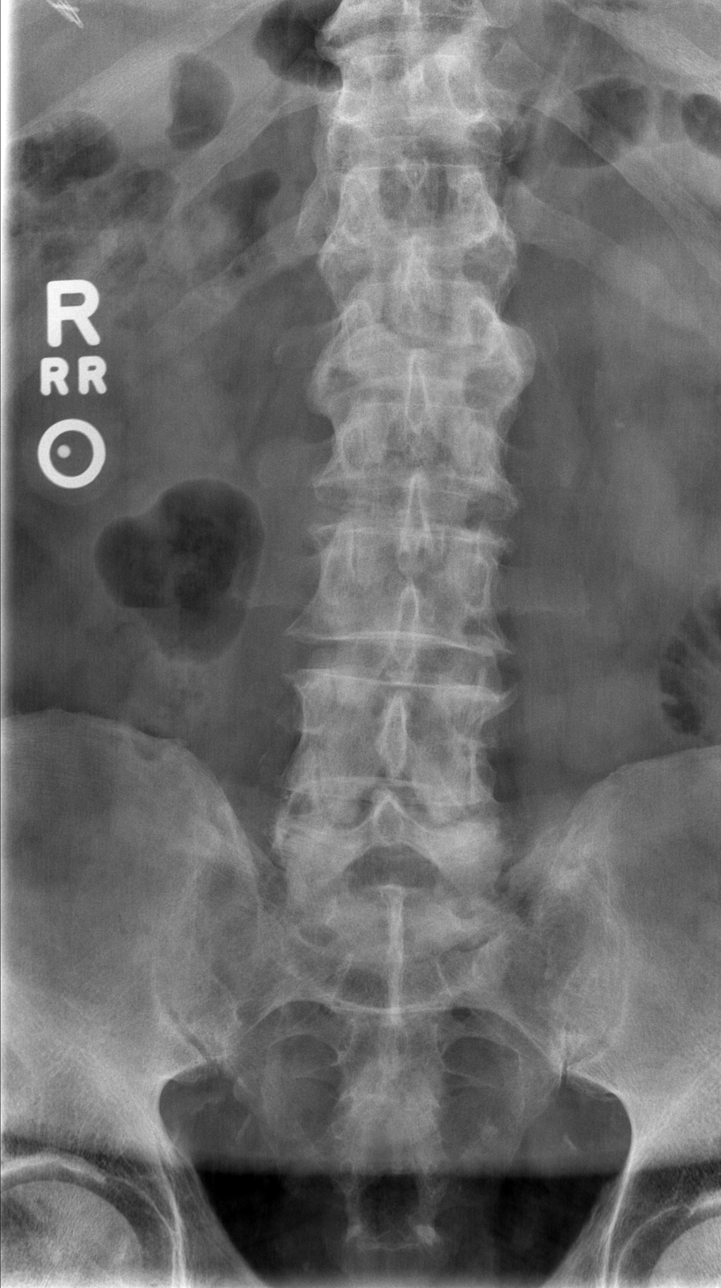

[t l-spine oblique exposure (1 of 2)]
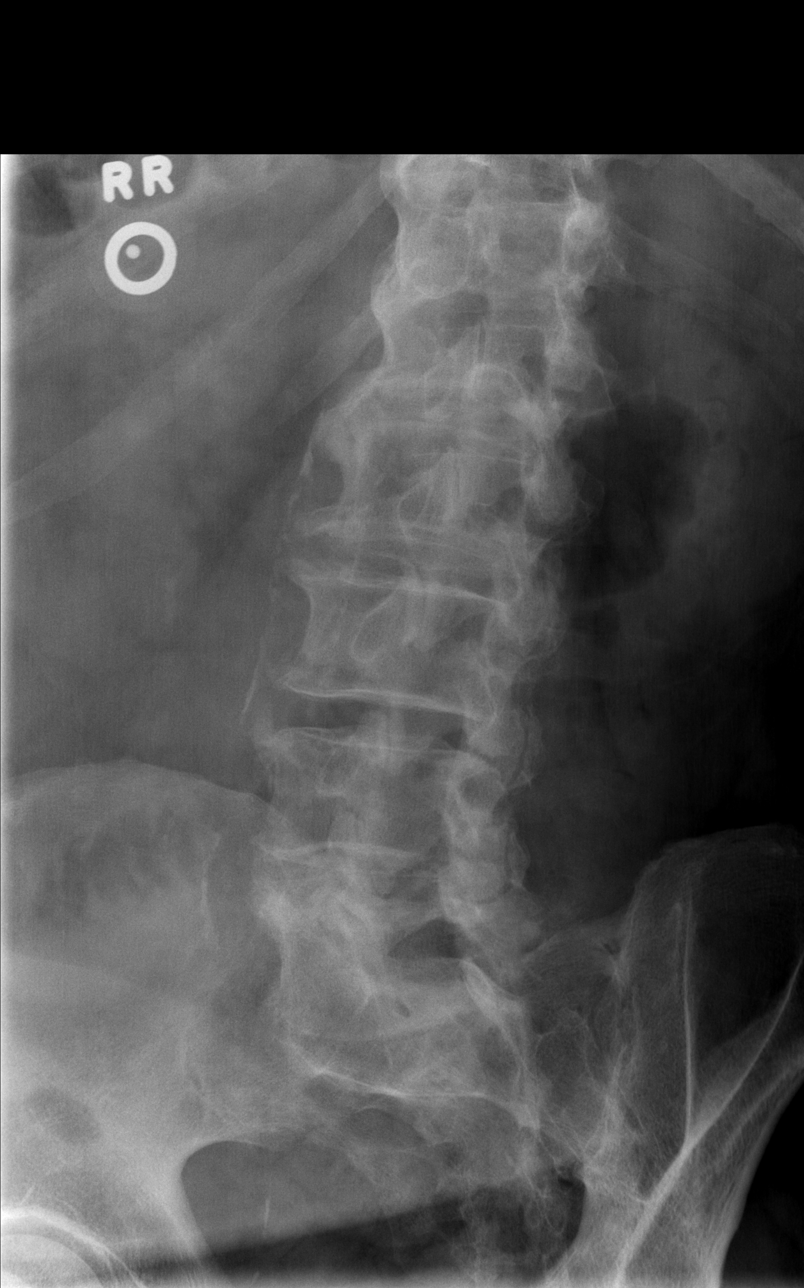

[t l-spine oblique exposure (2 of 2)]
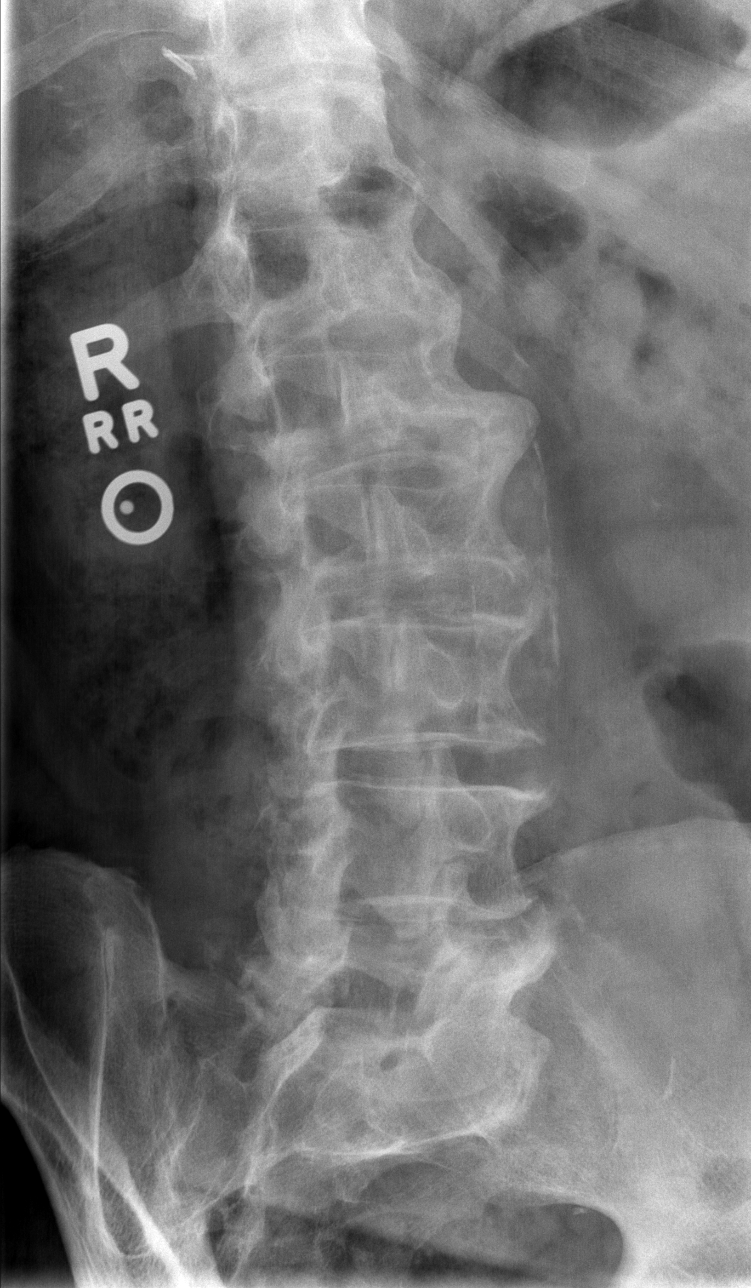

[t l-spine lat]
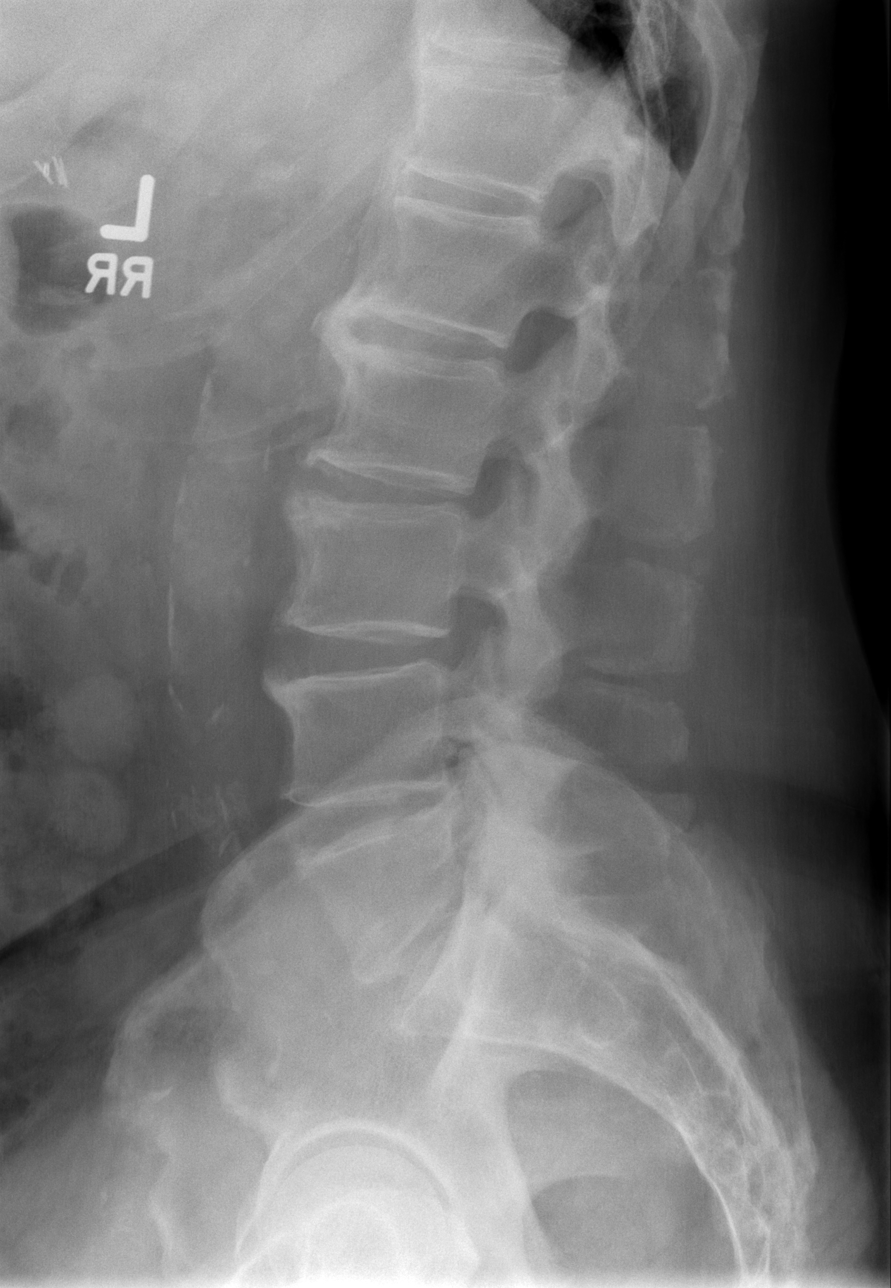

[t l-spine l5-s1 spot]
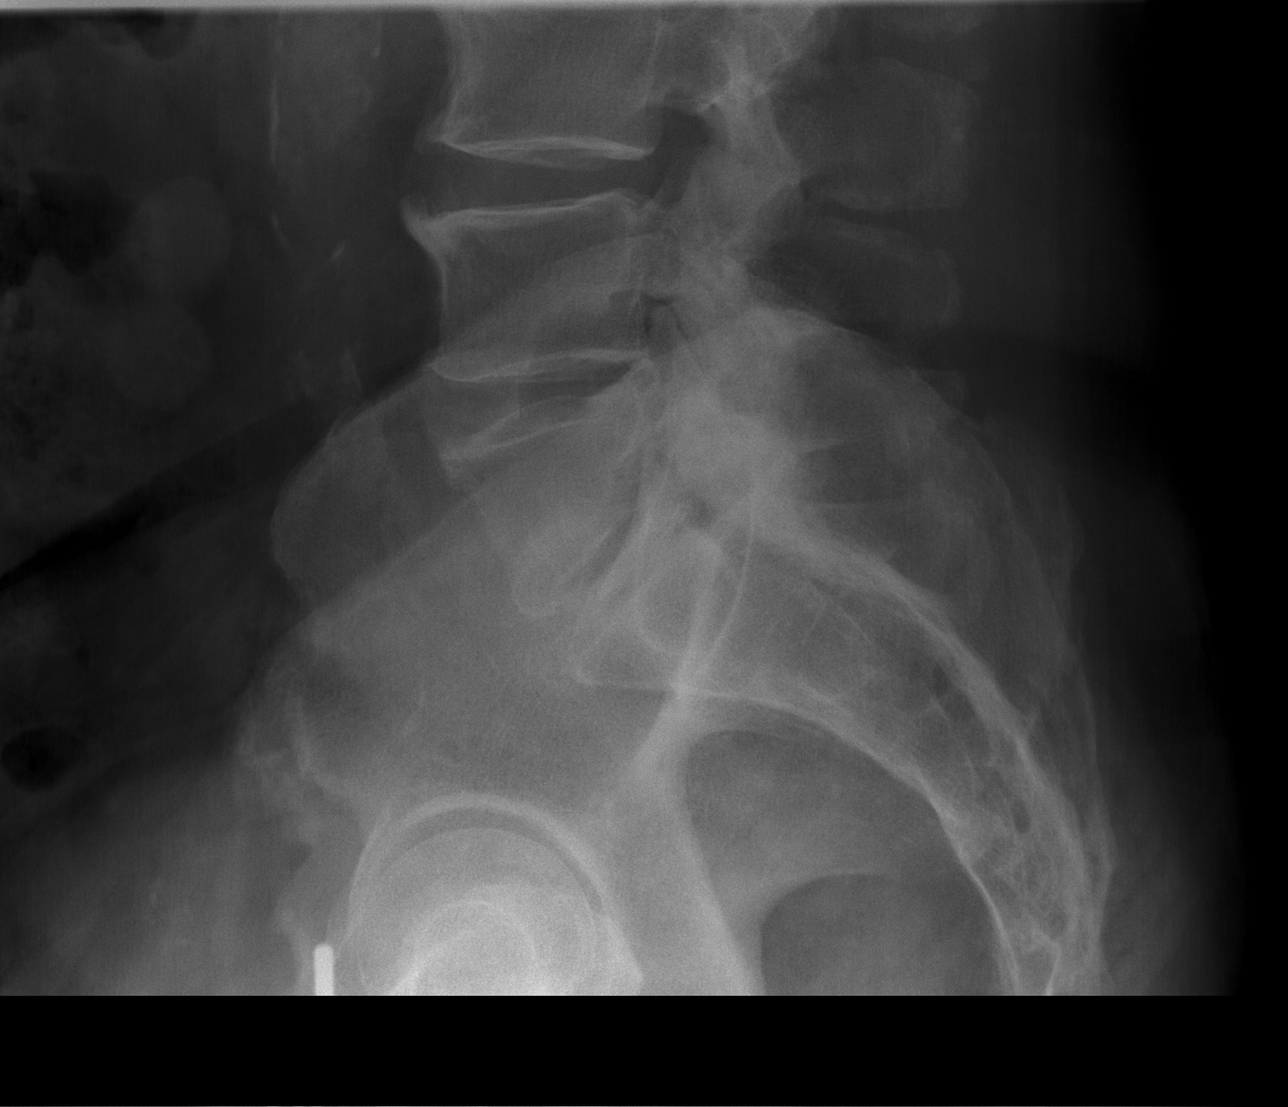

[5 of 5 positions shown; findings below may reference images not displayed]

FINDINGS: Five lumbar type vertebral bodies are well visualized. Vertebral
body height is well maintained. Multilevel osteophytic changes are
seen. No anterolisthesis is noted. Facet hypertrophic changes are
seen as well. Diffuse aortic calcifications are noted without
aneurysmal dilatation.
IMPRESSION: Multilevel degenerative change which has progressed slightly in the
interval from the prior exam. No acute abnormality noted.

## 2021-12-06 ENCOUNTER — Other Ambulatory Visit: Payer: Self-pay | Admitting: Cardiology

## 2022-04-17 DIAGNOSIS — Z79899 Other long term (current) drug therapy: Secondary | ICD-10-CM

## 2022-04-17 DIAGNOSIS — E039 Hypothyroidism, unspecified: Secondary | ICD-10-CM | POA: Insufficient documentation

## 2022-04-17 HISTORY — DX: Other long term (current) drug therapy: Z79.899

## 2022-04-17 HISTORY — DX: Hypothyroidism, unspecified: E03.9

## 2022-04-22 ENCOUNTER — Ambulatory Visit: Payer: Medicare Other | Attending: Cardiology

## 2022-04-22 DIAGNOSIS — I10 Essential (primary) hypertension: Secondary | ICD-10-CM | POA: Diagnosis not present

## 2022-04-22 DIAGNOSIS — E782 Mixed hyperlipidemia: Secondary | ICD-10-CM

## 2022-04-22 DIAGNOSIS — I25118 Atherosclerotic heart disease of native coronary artery with other forms of angina pectoris: Secondary | ICD-10-CM | POA: Diagnosis not present

## 2022-04-22 DIAGNOSIS — I350 Nonrheumatic aortic (valve) stenosis: Secondary | ICD-10-CM

## 2022-04-22 DIAGNOSIS — I447 Left bundle-branch block, unspecified: Secondary | ICD-10-CM | POA: Diagnosis not present

## 2022-04-22 LAB — ECHOCARDIOGRAM COMPLETE
AR max vel: 0.91 cm2
AV Area VTI: 0.84 cm2
AV Area mean vel: 0.82 cm2
AV Mean grad: 32 mmHg
AV Peak grad: 49.3 mmHg
Ao pk vel: 3.51 m/s
Area-P 1/2: 2.38 cm2
Calc EF: 50.9 %
S' Lateral: 4.1 cm
Single Plane A2C EF: 51.8 %
Single Plane A4C EF: 47.3 %

## 2022-04-23 ENCOUNTER — Other Ambulatory Visit: Payer: Self-pay

## 2022-04-23 DIAGNOSIS — I350 Nonrheumatic aortic (valve) stenosis: Secondary | ICD-10-CM

## 2022-06-20 DIAGNOSIS — I779 Disorder of arteries and arterioles, unspecified: Secondary | ICD-10-CM | POA: Insufficient documentation

## 2022-06-20 HISTORY — DX: Disorder of arteries and arterioles, unspecified: I77.9

## 2022-07-03 ENCOUNTER — Encounter (HOSPITAL_COMMUNITY): Payer: Self-pay

## 2022-07-03 ENCOUNTER — Inpatient Hospital Stay (HOSPITAL_COMMUNITY)
Admission: EM | Admit: 2022-07-03 | Discharge: 2022-07-22 | DRG: 216 | Disposition: A | Discharge status: Home / Self Care | Payer: Medicare Other | Attending: Thoracic Surgery (Cardiothoracic Vascular Surgery) | Admitting: Thoracic Surgery (Cardiothoracic Vascular Surgery)

## 2022-07-03 ENCOUNTER — Other Ambulatory Visit: Payer: Self-pay

## 2022-07-03 ENCOUNTER — Emergency Department (HOSPITAL_COMMUNITY): Payer: Medicare Other

## 2022-07-03 DIAGNOSIS — G459 Transient cerebral ischemic attack, unspecified: Secondary | ICD-10-CM | POA: Diagnosis not present

## 2022-07-03 DIAGNOSIS — E8770 Fluid overload, unspecified: Secondary | ICD-10-CM | POA: Diagnosis not present

## 2022-07-03 DIAGNOSIS — I252 Old myocardial infarction: Secondary | ICD-10-CM

## 2022-07-03 DIAGNOSIS — I350 Nonrheumatic aortic (valve) stenosis: Secondary | ICD-10-CM | POA: Diagnosis present

## 2022-07-03 DIAGNOSIS — Z888 Allergy status to other drugs, medicaments and biological substances status: Secondary | ICD-10-CM

## 2022-07-03 DIAGNOSIS — Z1152 Encounter for screening for COVID-19: Secondary | ICD-10-CM

## 2022-07-03 DIAGNOSIS — I10 Essential (primary) hypertension: Secondary | ICD-10-CM | POA: Diagnosis present

## 2022-07-03 DIAGNOSIS — Z8673 Personal history of transient ischemic attack (TIA), and cerebral infarction without residual deficits: Secondary | ICD-10-CM

## 2022-07-03 DIAGNOSIS — Z7982 Long term (current) use of aspirin: Secondary | ICD-10-CM

## 2022-07-03 DIAGNOSIS — I509 Heart failure, unspecified: Secondary | ICD-10-CM | POA: Diagnosis not present

## 2022-07-03 DIAGNOSIS — Z6839 Body mass index (BMI) 39.0-39.9, adult: Secondary | ICD-10-CM | POA: Diagnosis not present

## 2022-07-03 DIAGNOSIS — J189 Pneumonia, unspecified organism: Secondary | ICD-10-CM | POA: Diagnosis not present

## 2022-07-03 DIAGNOSIS — G4733 Obstructive sleep apnea (adult) (pediatric): Secondary | ICD-10-CM | POA: Diagnosis present

## 2022-07-03 DIAGNOSIS — Z01810 Encounter for preprocedural cardiovascular examination: Secondary | ICD-10-CM | POA: Diagnosis not present

## 2022-07-03 DIAGNOSIS — R7989 Other specified abnormal findings of blood chemistry: Secondary | ICD-10-CM | POA: Diagnosis not present

## 2022-07-03 DIAGNOSIS — E871 Hypo-osmolality and hyponatremia: Secondary | ICD-10-CM | POA: Diagnosis not present

## 2022-07-03 DIAGNOSIS — R0609 Other forms of dyspnea: Secondary | ICD-10-CM | POA: Diagnosis not present

## 2022-07-03 DIAGNOSIS — D509 Iron deficiency anemia, unspecified: Secondary | ICD-10-CM | POA: Diagnosis not present

## 2022-07-03 DIAGNOSIS — N179 Acute kidney failure, unspecified: Secondary | ICD-10-CM | POA: Diagnosis not present

## 2022-07-03 DIAGNOSIS — I2510 Atherosclerotic heart disease of native coronary artery without angina pectoris: Secondary | ICD-10-CM | POA: Diagnosis not present

## 2022-07-03 DIAGNOSIS — R079 Chest pain, unspecified: Principal | ICD-10-CM

## 2022-07-03 DIAGNOSIS — Z8249 Family history of ischemic heart disease and other diseases of the circulatory system: Secondary | ICD-10-CM

## 2022-07-03 DIAGNOSIS — J9601 Acute respiratory failure with hypoxia: Secondary | ICD-10-CM | POA: Diagnosis not present

## 2022-07-03 DIAGNOSIS — I480 Paroxysmal atrial fibrillation: Secondary | ICD-10-CM | POA: Diagnosis not present

## 2022-07-03 DIAGNOSIS — I360 Nonrheumatic tricuspid (valve) stenosis: Secondary | ICD-10-CM | POA: Diagnosis not present

## 2022-07-03 DIAGNOSIS — Z880 Allergy status to penicillin: Secondary | ICD-10-CM

## 2022-07-03 DIAGNOSIS — E876 Hypokalemia: Secondary | ICD-10-CM | POA: Diagnosis not present

## 2022-07-03 DIAGNOSIS — Z91040 Latex allergy status: Secondary | ICD-10-CM

## 2022-07-03 DIAGNOSIS — Z531 Procedure and treatment not carried out because of patient's decision for reasons of belief and group pressure: Secondary | ICD-10-CM | POA: Diagnosis present

## 2022-07-03 DIAGNOSIS — I25110 Atherosclerotic heart disease of native coronary artery with unstable angina pectoris: Secondary | ICD-10-CM | POA: Diagnosis present

## 2022-07-03 DIAGNOSIS — Z87891 Personal history of nicotine dependence: Secondary | ICD-10-CM

## 2022-07-03 DIAGNOSIS — D62 Acute posthemorrhagic anemia: Secondary | ICD-10-CM | POA: Diagnosis not present

## 2022-07-03 DIAGNOSIS — F419 Anxiety disorder, unspecified: Secondary | ICD-10-CM | POA: Diagnosis not present

## 2022-07-03 DIAGNOSIS — Z79899 Other long term (current) drug therapy: Secondary | ICD-10-CM | POA: Diagnosis not present

## 2022-07-03 DIAGNOSIS — I214 Non-ST elevation (NSTEMI) myocardial infarction: Principal | ICD-10-CM | POA: Diagnosis present

## 2022-07-03 DIAGNOSIS — E7800 Pure hypercholesterolemia, unspecified: Secondary | ICD-10-CM | POA: Diagnosis present

## 2022-07-03 DIAGNOSIS — Z951 Presence of aortocoronary bypass graft: Secondary | ICD-10-CM | POA: Diagnosis not present

## 2022-07-03 DIAGNOSIS — D696 Thrombocytopenia, unspecified: Secondary | ICD-10-CM | POA: Diagnosis present

## 2022-07-03 DIAGNOSIS — R600 Localized edema: Secondary | ICD-10-CM | POA: Diagnosis not present

## 2022-07-03 DIAGNOSIS — Z7902 Long term (current) use of antithrombotics/antiplatelets: Secondary | ICD-10-CM

## 2022-07-03 DIAGNOSIS — Z955 Presence of coronary angioplasty implant and graft: Secondary | ICD-10-CM

## 2022-07-03 DIAGNOSIS — J90 Pleural effusion, not elsewhere classified: Secondary | ICD-10-CM | POA: Diagnosis not present

## 2022-07-03 DIAGNOSIS — E785 Hyperlipidemia, unspecified: Secondary | ICD-10-CM | POA: Diagnosis not present

## 2022-07-03 DIAGNOSIS — I447 Left bundle-branch block, unspecified: Secondary | ICD-10-CM | POA: Diagnosis present

## 2022-07-03 DIAGNOSIS — Z833 Family history of diabetes mellitus: Secondary | ICD-10-CM

## 2022-07-03 HISTORY — DX: Non-ST elevation (NSTEMI) myocardial infarction: I21.4

## 2022-07-03 LAB — BASIC METABOLIC PANEL
Anion gap: 12 (ref 5–15)
BUN: 22 mg/dL (ref 8–23)
CO2: 27 mmol/L (ref 22–32)
Calcium: 9.6 mg/dL (ref 8.9–10.3)
Chloride: 101 mmol/L (ref 98–111)
Creatinine, Ser: 1.08 mg/dL (ref 0.61–1.24)
GFR, Estimated: >60 mL/min (ref >60)
Glucose, Bld: 99 mg/dL (ref 70–99)
Potassium: 4.4 mmol/L (ref 3.5–5.1)
Sodium: 140 mmol/L (ref 135–145)

## 2022-07-03 LAB — CBC
HCT: 47 % (ref 39.0–52.0)
Hemoglobin: 17 g/dL (ref 13.0–17.0)
MCH: 32 pg (ref 26.0–34.0)
MCHC: 36.2 g/dL — ABNORMAL HIGH (ref 30.0–36.0)
MCV: 88.3 fL (ref 80.0–100.0)
Platelets: 150 10*3/uL (ref 150–400)
RBC: 5.32 MIL/uL (ref 4.22–5.81)
RDW: 13.3 % (ref 11.5–15.5)
WBC: 6 10*3/uL (ref 4.0–10.5)
nRBC: 0 % (ref 0.0–0.2)

## 2022-07-03 LAB — BRAIN NATRIURETIC PEPTIDE: B Natriuretic Peptide: 141.9 pg/mL — ABNORMAL HIGH (ref 0.0–100.0)

## 2022-07-03 LAB — TROPONIN I (HIGH SENSITIVITY)
Troponin I (High Sensitivity): 49 ng/L — ABNORMAL HIGH (ref <18)
Troponin I (High Sensitivity): 114 ng/L (ref <18)

## 2022-07-03 MED ORDER — SODIUM CHLORIDE 0.9 % WEIGHT BASED INFUSION
1.0000 mL/kg/h | INTRAVENOUS | Status: DC
  Administered 2022-07-04: 1 mL/kg/h via INTRAVENOUS

## 2022-07-03 MED ORDER — EZETIMIBE 10 MG PO TABS
10.0000 mg | ORAL_TABLET | Freq: Every day | ORAL | Status: DC
  Administered 2022-07-03 – 2022-07-21 (×18): 10 mg via ORAL
  Filled 2022-07-03 (×18): qty 1

## 2022-07-03 MED ORDER — PRAVASTATIN SODIUM 10 MG PO TABS
20.0000 mg | ORAL_TABLET | ORAL | Status: DC
  Administered 2022-07-04: 20 mg via ORAL
  Filled 2022-07-03: qty 2

## 2022-07-03 MED ORDER — SODIUM CHLORIDE 0.9% FLUSH: 3.0000 mL | INTRAVENOUS | Status: DC | PRN

## 2022-07-03 MED ORDER — HEPARIN (PORCINE) 25000 UT/250ML-% IV SOLN
1500.0000 [IU]/h | INTRAVENOUS | Status: DC
  Administered 2022-07-03: 1150 [IU]/h via INTRAVENOUS
  Filled 2022-07-03 (×2): qty 250

## 2022-07-03 MED ORDER — SODIUM CHLORIDE 0.9% FLUSH
3.0000 mL | Freq: Two times a day (BID) | INTRAVENOUS | Status: DC
  Administered 2022-07-03 – 2022-07-04 (×2): 3 mL via INTRAVENOUS

## 2022-07-03 MED ORDER — HEPARIN BOLUS VIA INFUSION
4000.0000 [IU] | Freq: Once | INTRAVENOUS | Status: AC
  Administered 2022-07-03: 4000 [IU] via INTRAVENOUS
  Filled 2022-07-03: qty 4000

## 2022-07-03 MED ORDER — ACETAMINOPHEN 325 MG PO TABS: 650.0000 mg | ORAL_TABLET | ORAL | Status: DC | PRN

## 2022-07-03 MED ORDER — SODIUM CHLORIDE 0.9 % IV SOLN: 250.0000 mL | INTRAVENOUS | Status: DC | PRN

## 2022-07-03 MED ORDER — ASPIRIN 81 MG PO CHEW
81.0000 mg | CHEWABLE_TABLET | ORAL | Status: AC
  Administered 2022-07-04: 81 mg via ORAL
  Filled 2022-07-03: qty 1

## 2022-07-03 MED ORDER — SODIUM CHLORIDE 0.9 % WEIGHT BASED INFUSION
3.0000 mL/kg/h | INTRAVENOUS | Status: DC
  Administered 2022-07-04: 3 mL/kg/h via INTRAVENOUS

## 2022-07-03 MED ORDER — ONDANSETRON HCL 4 MG/2ML IJ SOLN: 4.0000 mg | Freq: Four times a day (QID) | INTRAMUSCULAR | Status: DC | PRN

## 2022-07-03 MED ORDER — ASPIRIN 81 MG PO TBEC
81.0000 mg | DELAYED_RELEASE_TABLET | Freq: Every day | ORAL | Status: DC
  Administered 2022-07-03 – 2022-07-10 (×8): 81 mg via ORAL
  Filled 2022-07-03 (×8): qty 1

## 2022-07-03 MED ORDER — CLOPIDOGREL BISULFATE 75 MG PO TABS
75.0000 mg | ORAL_TABLET | Freq: Every day | ORAL | Status: DC
  Administered 2022-07-03: 75 mg via ORAL
  Filled 2022-07-03: qty 1

## 2022-07-03 MED ORDER — SODIUM CHLORIDE 0.9% FLUSH
3.0000 mL | Freq: Two times a day (BID) | INTRAVENOUS | Status: DC
  Administered 2022-07-03 – 2022-07-05 (×3): 3 mL via INTRAVENOUS

## 2022-07-03 NOTE — Progress Notes (Signed)
ANTICOAGULATION CONSULT NOTE - Initial Consult  Pharmacy Consult for heparin  Indication: chest pain/ACS  Allergies  Allergen Reactions   Penicillins     Had rash in his 30s-40s with this as well as recurrence when re-trialed   Statins Other (See Comments)    Muscle pains; increasing with continued use.   Muscle pains; increasing with continued use.    Isosorbide Other (See Comments)    "makes me dizzy and I have headaches"   Latex Rash    Has noticed with bandages in past, leaves a sore on skin but more recently with APAP mask     Patient Measurements: Height: '5\' 9"'$  (175.3 cm) Weight: 117.9 kg (260 lb) IBW/kg (Calculated) : 70.7 Heparin Dosing Weight: 97.2kg   Vital Signs: Temp: 97.6 F (36.4 C) (03/14 1923) Temp Source: Oral (03/14 1923) BP: 126/56 (03/14 2000) Pulse Rate: 50 (03/14 2000)  Labs: Recent Labs    07/03/22 1529 07/03/22 1826  HGB 17.0  --   HCT 47.0  --   PLT 150  --   CREATININE 1.08  --   TROPONINIHS 49* 114*    Estimated Creatinine Clearance: 71.4 mL/min (by C-G formula based on SCr of 1.08 mg/dL).   Medical History: Past Medical History:  Diagnosis Date   Aortic stenosis 08/10/2015   Bilateral leg edema 08/22/2015   Bradycardia 06/01/2017   Cellulitis of left lower extremity 04/29/2017   Coronary artery disease involving native coronary artery 10/22/2017   DOE (dyspnea on exertion) 05/04/2017   Essential hypertension 08/13/2015   Heart murmur 04/29/2017   Kissing, osteophytes 08/22/2015   LBBB (left bundle branch block) 08/22/2015   Lipid disorder 11/30/2017   Lumbar facet joint pain 08/22/2015   Medicare annual wellness visit, subsequent 06/02/2016   Morbid obesity ([place]) 10/22/2017   Murmur 04/29/2017   Myopathy 09/02/2019   Formatting of this note might be different from the original. Secondary to high freq statin use.   Need for vaccination for Strep pneumoniae 03/27/2017   Non-ST elevation myocardial infarction (NSTEMI),  subendocardial infarction, subsequent episode of care ([place]) 11/30/2017   Obesity, Class II, BMI 35-39.9 08/10/2015   OSA on CPAP 12/02/2017   Pure hypercholesterolemia 05/24/2015   Seborrheic keratoses 03/27/2017   Overview:  Of the right forehead.   Statin intolerance 11/30/2017   Thrombocytopenia ([place]) 04/29/2017   TIA (transient ischemic attack) 12/19/2016   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   Venous insufficiency 08/10/2015    Assessment: Patient admitted with CC of SOB and CP on exertion. Trop elevation to 113. CBC stable. Not on anticoag PTA. Pharmacy consulted to dose heparin.    Goal of Therapy:  Heparin level 0.3-0.7 units/ml Monitor platelets by anticoagulation protocol: Yes   Plan:  Give 4000 units bolus x 1 Start heparin infusion at 1150 units/hr Check anti-Xa level in 8 hours and daily while on heparin Continue to monitor H&H and platelets  Esmeralda Arthur, PharmD, BCCCP  07/03/2022,8:43 PM

## 2022-07-03 NOTE — ED Provider Notes (Signed)
[facility] Provider Note   CSN: [id number] Arrival date & time: 07/03/22  1524     History  Chief Complaint  Patient presents with   Chest Pain   SOB    Craig Lawson is a 77 y.o. male presented to ED with chest pain and shortness of breath.  Patient reports that he has a chest discomfort for several months that he feels is gradually been worsening, and specifically occurs only with exertion.  He says that when he is walking uphill or walking for longer distances or climbing stairs, he develops a burning sensation near his epigastrium and some shortness of breath.  Typically this feeling resolves after few minutes when he rest.  However it is reached the point where he can basically do no activity at all around the house without having this very unsettling and discomfort feeling.  Since arriving in the ER the patient reports that his symptoms have settled nearly gone away but not completely.  He does not want nitroglycerin because he reports he has taken in the past and it does not relieve his discomfort or symptoms.  He is also known to have severe aortic stenosis on echocardiogram most recently checked in January and is pending evaluation for potential TAVR  Last seen by cardiologist Dr Bettina Gavia July 2023.  Per his external records, patient is a history of coronary disease, NSTEMI in 2020 with PCI and stent to the obtuse marginal branch and right coronary artery, subsequent PCI and stent to the proximal and mid LAD with orbital atherectomy and drug-eluting stents.  He has a known history of left bundle branch block.  Also hyperlipidemia with poor statin tolerance. Echo 04/22/22 with an EF of 50 to 55%, and moderate to severe aortic valve stenosis.  HPI     Home Medications Prior to Admission medications   Medication Sig Start Date End Date Taking? Authorizing Provider  aspirin EC 81 MG tablet Take 81 mg by mouth at bedtime.    [provider]  Cholecalciferol 125 MCG (5000 UT) TABS Take 1 tablet by mouth daily.    [provider]  clopidogrel (PLAVIX) 75 MG tablet TAKE 1 TABLET BY MOUTH AT  BEDTIME 12/09/21   Richardo Priest, MD  ezetimibe (ZETIA) 10 MG tablet Take 10 mg by mouth at bedtime.    [provider]  furosemide (LASIX) 20 MG tablet Take 20 mg by mouth 3 (three) times a week. 07/08/21   [provider]  hydrochlorothiazide (HYDRODIURIL) 25 MG tablet Take 25 mg by mouth at bedtime.  09/12/16   [provider]  losartan (COZAAR) 50 MG tablet Take 50 mg by mouth at bedtime.    [provider]  nitroGLYCERIN (NITROSTAT) 0.4 MG SL tablet Place 1 tablet (0.4 mg total) under the tongue every 5 (five) minutes as needed for chest pain. 05/13/21   Richardo Priest, MD  pravastatin (PRAVACHOL) 20 MG tablet Take 1 tablet (20 mg total) by mouth 2 (two) times a week. Monday and Friday 08/08/19 11/11/21  Richardo Priest, MD  sildenafil (VIAGRA) 50 MG tablet Take 50 mg by mouth daily as needed for erectile dysfunction. 08/22/20   [provider]      Allergies    Penicillins, Statins, Isosorbide, and Latex    Review of Systems   Review of Systems  Physical Exam Updated Vital Signs BP (!) 126/56   Pulse (!) 50   Temp 97.6 F (  36.4 C) (Oral)   Resp 17   Ht '5\' 9"'$  (1.753 m)   Wt 117.9 kg   SpO2 92%   BMI 38.40 kg/m  Physical Exam Constitutional:      General: He is not in acute distress. HENT:     Head: Normocephalic and atraumatic.  Eyes:     Conjunctiva/sclera: Conjunctivae normal.     Pupils: Pupils are equal, round, and reactive to light.  Cardiovascular:     Rate and Rhythm: Normal rate and regular rhythm.     Heart sounds: Murmur heard.  Pulmonary:     Effort: Pulmonary effort is normal. No respiratory distress.     Breath sounds: Normal breath sounds.  Abdominal:     General: There is no distension.     Tenderness: There is no abdominal tenderness.   Skin:    General: Skin is warm and dry.  Neurological:     General: No focal deficit present.     Mental Status: He is alert. Mental status is at baseline.  Psychiatric:        Mood and Affect: Mood normal.        Behavior: Behavior normal.     ED Results / Procedures / Treatments   Labs (all labs ordered are listed, but only abnormal results are displayed) Labs Reviewed  CBC - Abnormal; Notable for the following components:      Result Value   MCHC 36.2 (*)    All other components within normal limits  BRAIN NATRIURETIC PEPTIDE - Abnormal; Notable for the following components:   B Natriuretic Peptide 141.9 (*)    All other components within normal limits  TROPONIN I (HIGH SENSITIVITY) - Abnormal; Notable for the following components:   Troponin I (High Sensitivity) 49 (*)    All other components within normal limits  TROPONIN I (HIGH SENSITIVITY) - Abnormal; Notable for the following components:   Troponin I (High Sensitivity) 114 (*)    All other components within normal limits  BASIC METABOLIC PANEL  HEPARIN LEVEL (UNFRACTIONATED)  CBC  LIPOPROTEIN A (LPA)  TSH  HEPATIC FUNCTION PANEL  BASIC METABOLIC PANEL  LIPID PANEL    EKG EKG Interpretation  Date/Time:  Thursday July 03 2022 15:10:58 EDT Ventricular Rate:  72 PR Interval:  182 QRS Duration: 158 QT Interval:  430 QTC Calculation: 470 R Axis:   -79 Text Interpretation: Normal sinus rhythm Left axis deviation Left bundle branch block Abnormal ECG When compared with ECG of 17-Aug-2019 03:58, PREVIOUS ECG IS PRESENT No significant changes Confirmed by Octaviano Glow [phone]) on 07/03/2022 6:12:21 PM  Radiology DG Chest 2 View  Result Date: 07/03/2022 CLINICAL DATA:  Provided history: Chest pain. EXAM: CHEST - 2 VIEW COMPARISON:  Prior chest radiographs 11/25/2021 and earlier. FINDINGS: Stable cardiac and mediastinal contours as compared to the prior chest radiographs of 11/25/2021. Aortic atherosclerosis.  Chronic elevation of the right hemidiaphragm. No appreciable airspace consolidation or pulmonary edema. No evidence of pleural effusion or pneumothorax. No acute osseous abnormality identified. Surgical clips within the upper abdomen. IMPRESSION: No evidence of acute cardiopulmonary abnormality. Aortic Atherosclerosis (ICD10-I70.0). Electronically Signed   By: Kellie Simmering D.O.   On: 07/03/2022 16:10    Procedures Procedures    Medications Ordered in ED Medications  heparin ADULT infusion 100 units/mL (25000 units/229m) (1,150 Units/hr Intravenous New Bag/Given 07/03/22 2123)  aspirin EC tablet 81 mg (81 mg Oral Given 07/03/22 2125)  ezetimibe (ZETIA) tablet 10 mg (10 mg Oral Given 07/03/22 2125)  pravastatin (PRAVACHOL) tablet 20 mg (has no administration in time range)  clopidogrel (PLAVIX) tablet 75 mg (75 mg Oral Given 07/03/22 2125)  acetaminophen (TYLENOL) tablet 650 mg (has no administration in time range)  ondansetron (ZOFRAN) injection 4 mg (has no administration in time range)  sodium chloride flush (NS) 0.9 % injection 3 mL (3 mLs Intravenous Given 07/03/22 2126)  sodium chloride flush (NS) 0.9 % injection 3 mL (has no administration in time range)  0.9 %  sodium chloride infusion (has no administration in time range)  aspirin chewable tablet 81 mg (has no administration in time range)  sodium chloride flush (NS) 0.9 % injection 3 mL (3 mLs Intravenous Given 07/03/22 2125)  sodium chloride flush (NS) 0.9 % injection 3 mL (has no administration in time range)  0.9 %  sodium chloride infusion (has no administration in time range)  0.9% sodium chloride infusion (has no administration in time range)    Followed by  0.9% sodium chloride infusion (has no administration in time range)  heparin bolus via infusion 4,000 Units (4,000 Units Intravenous Bolus from Bag 07/03/22 2123)    ED Course/ Medical Decision Making/ A&P Clinical Course as of 07/03/22 2129  Thu Jul 03, 2022  1931  Consulted Dr Marlou Porch cardiology who will evaluate patient [MT]  2050 Admitted by cardiology [MT]    Clinical Course User Index [MT] Langston Masker Carola Rhine, MD                             Medical Decision Making Amount and/or Complexity of Data Reviewed Labs: ordered.  Risk Prescription drug management. Decision regarding hospitalization.   This patient presents to the ED with concern for chest pain. This involves an extensive number of treatment options, and is a complaint that carries with it a high risk of complications and morbidity.  The differential diagnosis includes anginal pain versus pneumonia versus pleural effusion versus other  Reflux is a possibility, however the patient specifically reports that the symptoms only occur with exertion, and never while he is at rest or at night.  Patient also has a prominent aortic murmur on exam, known history of moderate to severe aortic stenosis.  He does report exertional fatigue, but no syncope.  It is difficult to discern whether his fatigue may be related to his anginal symptoms or perhaps due to his aortic stenosis.  I will consult with cardiology.  Co-morbidities that complicate the patient evaluation: Multiple risk factors for coronary disease  Additional history obtained from the patient's bedside  External records from outside source obtained and reviewed including cardiology outpatient records and echocardiogram report  I ordered and personally interpreted labs.  The pertinent results include: BMP and CBC unremarkable.  BNP 141.  Initial troponin mildly elevated at 49.  Repeat troponin showing elevation to 114  I ordered imaging studies including x-ray of the chest I independently visualized and interpreted imaging which showed no acute abnormalities I agree with the radiologist interpretation  The patient was maintained on a cardiac monitor.  I personally viewed and interpreted the cardiac monitored which showed an underlying  rhythm of: Sinus rhythm  Per my interpretation the patient's ECG shows chronic left bundle branch block with no acute ischemic findings  I have reviewed the patients home medicines and have made adjustments as needed  Test Considered: I have a low suspicion for acute PE in this clinical setting and do not see an emergent indication for  CT angiogram.  I requested consultation with the cardiology, and discussed lab and imaging findings as well as pertinent plan - they recommend: Admission to cardiac service, heparin ordered per cardiology service  After the interventions noted above, I reevaluated the patient and found that they have: stayed the same   Dispostion:  After consideration of the diagnostic results and the patients response to treatment, I feel that the patent would benefit from admission         Final Clinical Impression(s) / ED Diagnoses Final diagnoses:  Chest pain, unspecified type    Rx / DC Orders ED Discharge Orders     None         Wyvonnia Dusky, MD 07/03/22 2129

## 2022-07-03 NOTE — ED Notes (Addendum)
Date and time results received: 07/03/22 8:27 PM  Test: Troponin Critical Value: 114   Name of Provider Notified: Lebron Conners

## 2022-07-03 NOTE — ED Provider Triage Note (Signed)
Emergency Medicine Provider Triage Evaluation Note  Craig Lawson , a 77 y.o. male  was evaluated in triage.  Patient complains of chest pain.  He reports that it is intermittent in nature and when he exerts himself.  Says that today he was climbing a ladder and all of a sudden felt the burning in his chest and completely lightheaded.  It got somewhat better with rest.  He says that he has a heart murmur and has had an echocardiogram that was abnormal.  He is supposed to have another one in a few months however the chest pain with exertion is getting worse.  History of NSTEMI, aortic stenosis and regurg, CAD, hypertension, hyperlipidemia  Review of Systems  Positive:  Negative:   Physical Exam  BP (!) 159/90 (BP Location: Right Arm)   Pulse 71   Temp 98.6 F (37 C)   Resp 18   SpO2 95%  Gen:   Awake, no distress   Resp:  Normal effort  MSK:   Moves extremities without difficulty  Other:  RRR  Medical Decision Making  Medically screening exam initiated at 3:48 PM.  Appropriate orders placed.  DONYAE TRIPP was informed that the remainder of the evaluation will be completed by another provider, this initial triage assessment does not replace that evaluation, and the importance of remaining in the ED until their evaluation is complete.     Rhae Hammock, PA-C 07/03/22 1549

## 2022-07-03 NOTE — H&P (Addendum)
Cardiology Admission History and Physical   Patient ID: Craig Lawson MRN: [id number]; DOB: Aug 16, 1945   Admission date: 07/03/2022  PCP:  Craig Lawson, [place] Prov[facility] Cardiologist:  Craig Lawson        Chief Complaint:  progressive SOB and chest pain  Patient Profile:   Craig Lawson is a 77 y.o. male Craig Lawson witness (blood product refusal) with CAD (DES to PDA and DES to [place] at [facility] 2019, PTCA of ISR of PDA and attempted unusccessful angioplasty to LAD in 07/2019 with staged intervention with PTCA to D2, PTCA/DES/orbital atherectomy to LAD), moderate-severe AS by echo 04/2022, chronic edema, mild dilation of ascending aorta, LBBB, HTN, HLD (declines statins due to joint pain), morbid obesity with OSA intolerant of CPAP, possible prior TIAs (improved after initiation of Plavix per pt), habitual ETOH use (max ~2 glasses of wine nightly) who is being seen 07/03/2022 for the evaluation of progressive angina.  History of Present Illness:   Craig Lawson follows with Dr. Bettina Lawson with above medical history with last cath in 2021 with PCI above, also residual 50% Cx, RCA, 75% D1 lesions treated medically. He has been maintained on ASA + Plavix. At last OV 10/2021, the patient was reported to have stable pattern of infrequent angina and requested medical management. F/u echo for surveillance of AS 04/22/22 showed EF 50-55%, moderate LVH, G1DD, moderate-severe AS, mild dilation of ascending aorta with plan to recheck in 6 months, sooner if symptoms arise. He has only been able to tolerate pravastatin 2x weekly (usually Thurs + Fri) and Zetia, declined bempedoic acid. He reports he is no longer on Lasix due to someone taking him off this, but taking HCTZ. He takes his medicine every evening. He is not sure if he is on losartan or olmesartan (our records and [facility]where records vary)  He presented to [facility] w/ symptoms consistent with progressive angina over the last 1 year. It's  become Lawson frequent the last few weeks, taking less and less activity to produce. Today it happened merely climbing a latter with chest burning and DOE. He also feels lightheaded during these episodes. No syncope. Edema is reported to be at baseline. He has not had any rest pain but sometimes feels an "awareness" of this. He was tired of feeling this way so came to ED for evaluation. He reports symptoms are debilitating at this point. Here he is pain free and EKG shows NSR with known LBBB. BNP 141, hsTroponin 49->114, H/H OK, BMET wnl, CXR NAD.  Past Medical History:  Diagnosis Date   Aortic stenosis 08/10/2015   Bilateral leg edema 08/22/2015   Bradycardia 06/01/2017   Cellulitis of left lower extremity 04/29/2017   Coronary artery disease involving native coronary artery 10/22/2017   DOE (dyspnea on exertion) 05/04/2017   Essential hypertension 08/13/2015   Heart murmur 04/29/2017   Kissing, osteophytes 08/22/2015   LBBB (left bundle branch block) 08/22/2015   Lipid disorder 11/30/2017   Lumbar facet joint pain 08/22/2015   Medicare annual wellness visit, subsequent 06/02/2016   Morbid obesity ([place]) 10/22/2017   Murmur 04/29/2017   Myopathy 09/02/2019   Formatting of this note might be different from the original. Secondary to high freq statin use.   Need for vaccination for Strep pneumoniae 03/27/2017   Non-ST elevation myocardial infarction (NSTEMI), subendocardial infarction, subsequent episode of care ([place]) 11/30/2017   Obesity, Class II, BMI 35-39.9 08/10/2015   OSA on CPAP 12/02/2017   Pure  hypercholesterolemia 05/24/2015   Seborrheic keratoses 03/27/2017   Overview:  Of the right forehead.   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   Venous insufficiency 08/10/2015    Past Surgical History:  Procedure Laterality Date   CARDIAC CATHETERIZATION     CHOLECYSTECTOMY      CORONARY ANGIOPLASTY     CORONARY ATHERECTOMY N/A 08/16/2019   Procedure: CORONARY ATHERECTOMY;  Surgeon: Craig Booze, Lawson;  Location: [place] [facility];  Service: Cardiovascular;  Laterality: N/A;   CORONARY BALLOON ANGIOPLASTY N/A 07/28/2019   Procedure: CORONARY BALLOON ANGIOPLASTY;  Surgeon: Craig Booze, Lawson;  Location: [place] [facility];  Service: Cardiovascular;  Laterality: N/A;   ESOPHAGOGASTRODUODENOSCOPY     INTRAVASCULAR ULTRASOUND/IVUS N/A 07/28/2019   Procedure: Intravascular Ultrasound/IVUS;  Surgeon: Craig Booze, Lawson;  Location: [place] [facility];  Service: Cardiovascular;  Laterality: N/A;   INTRAVASCULAR ULTRASOUND/IVUS N/A 08/16/2019   Procedure: Intravascular Ultrasound/IVUS;  Surgeon: Craig Booze, Lawson;  Location: [place] [facility];  Service: Cardiovascular;  Laterality: N/A;   LEFT HEART CATH AND CORONARY ANGIOGRAPHY N/A 07/28/2019   Procedure: LEFT HEART CATH AND CORONARY ANGIOGRAPHY;  Surgeon: Craig Booze, Lawson;  Location: [place] [facility];  Service: Cardiovascular;  Laterality: N/A;   LEFT HEART CATH AND CORONARY ANGIOGRAPHY N/A 08/16/2019   Procedure: LEFT HEART CATH AND CORONARY ANGIOGRAPHY;  Surgeon: Craig Booze, Lawson;  Location: [place] [facility];  Service: Cardiovascular;  Laterality: N/A;     Medications Prior to Admission: Prior to Admission medications   Medication Sig Start Date End Date Taking? Authorizing Provider  aspirin EC 81 MG tablet Take 81 mg by mouth at bedtime.    Provider, Historical, Lawson  Cholecalciferol 125 MCG (5000 UT) TABS Take 1 tablet by mouth daily.    Provider, Historical, Lawson  clopidogrel (PLAVIX) 75 MG tablet TAKE 1 TABLET BY MOUTH AT  BEDTIME 12/09/21   Richardo Priest, Lawson  ezetimibe (ZETIA) 10 MG tablet Take 10 mg by mouth at bedtime.    Provider, Historical, Lawson  furosemide (LASIX) 20 MG tablet Take 20 mg by mouth 3 (three) times a week. 07/08/21   Provider, Historical, Lawson  hydrochlorothiazide  (HYDRODIURIL) 25 MG tablet Take 25 mg by mouth at bedtime.  09/12/16   Provider, Historical, Lawson  losartan (COZAAR) 50 MG tablet Take 50 mg by mouth at bedtime.    Provider, Historical, Lawson  nitroGLYCERIN (NITROSTAT) 0.4 MG SL tablet Place 1 tablet (0.4 mg total) under the tongue every 5 (five) minutes as needed for chest pain. 05/13/21   Richardo Priest, Lawson  pravastatin (PRAVACHOL) 20 MG tablet Take 1 tablet (20 mg total) by mouth 2 (two) times a week. Monday and Friday 08/08/19 11/11/21  Richardo Priest, Lawson  sildenafil (VIAGRA) 50 MG tablet Take 50 mg by mouth daily as needed for erectile dysfunction. 08/22/20   Provider, Historical, Lawson     Allergies:    Allergies  Allergen Reactions   Penicillins     Had rash in his 30s-40s with this as well as recurrence when re-trialed   Statins Other (See Comments)    Muscle pains; increasing with continued use.   Muscle pains; increasing with continued use.    Isosorbide Other (See Comments)    "makes me dizzy and I have headaches"   Latex Rash    Has noticed with bandages in  past, leaves a sore on skin but Lawson recently with APAP mask     Social History:   Social History   Socioeconomic History   Marital status: Married    Spouse name: Not on file   Number of children: Not on file   Years of education: Not on file   Highest education level: Not on file  Occupational History   Not on file  Tobacco Use   Smoking status: Former    Types: Cigarettes    Quit date: 66    Years since quitting: 51.2    Passive exposure: Past   Smokeless tobacco: Never  Vaping Use   Vaping Use: Never used  Substance and Sexual Activity   Alcohol use: Yes    Alcohol/week: 7.0 standard drinks of alcohol    Types: 7 Glasses of wine per week    Comment: per week   Drug use: No   Sexual activity: Not on file  Other Topics Concern   Not on file  Social History Narrative   Not on file   Social Determinants of Health   Financial Resource Strain: Not on file   Food Insecurity: Not on file  Transportation Needs: Not on file  Physical Activity: Not on file  Stress: Not on file  Social Connections: Not on file  Intimate Partner Violence: Not on file    Family History:   The patient's family history includes Cancer in his mother; Diabetes in his brother and sister; Hypertension in his brother.    ROS:  Please see the history of present illness.  All other ROS reviewed and negative.     Physical Exam/Data:   Vitals:   07/03/22 1900 07/03/22 1923 07/03/22 1930 07/03/22 2000  BP: 135/70  133/63 (!) 126/56  Pulse: (!) 48  (!) 50 (!) 50  Resp: '15  17 17  '$ Temp:  97.6 F (36.4 C)    TempSrc:  Oral    SpO2: 92%  92% 92%  Weight:      Height:       No intake or output data in the 24 hours ending 07/03/22 2043    07/03/2022    6:34 PM 11/11/2021    8:17 AM 05/13/2021    9:27 AM  Last 3 Weights  Weight (lbs) 260 lb 265 lb 261 lb  Weight (kg) 117.935 kg 120.203 kg 118.389 kg     Body mass index is 38.4 kg/m.  General: Well developed, well nourished, in no acute distress. Head: Normocephalic, atraumatic, sclera non-icteric, no xanthomas, nares are without discharge. Neck: Negative for carotid bruits. JVP not elevated. Lungs: Clear bilaterally to auscultation without wheezes, rales, or rhonchi. Breathing is unlabored. Heart: RRR S1 S2, + SEM RUSB c/w AS, no rubs or gallops.  Abdomen: Soft, non-tender, non-distended with normoactive bowel sounds. No rebound/guarding. Extremities: No clubbing or cyanosis. Trace-1+ BLE dependent edema. Distal pedal pulses are 2+ and equal bilaterally. Neuro: Alert and oriented X 3. Moves all extremities spontaneously. Psych:  Responds to questions appropriately with a normal affect.    EKG:  The ECG that was done today was personally reviewed and demonstrates NSR 72bpm with LBBB nonspecific STTW changes  Relevant CV Studies: 2D echo 04/22/22   1. Left ventricular ejection fraction, by estimation, is 50 to  55%. The  left ventricle has low normal function. Left ventricular endocardial  border not optimally defined to evaluate regional wall motion. There is  moderate concentric left ventricular  hypertrophy. Left ventricular diastolic parameters  are consistent with  Grade I diastolic dysfunction (impaired relaxation).   2. Right ventricular systolic function is normal. The right ventricular  size is normal. Tricuspid regurgitation signal is inadequate for assessing  PA pressure.   3. The mitral valve is normal in structure. No evidence of mitral valve  regurgitation. No evidence of mitral stenosis.   4. Reduced SVI      . The aortic valve is tricuspid. There is mild calcification of the  aortic valve. There is mild thickening of the aortic valve. Aortic valve  regurgitation is not visualized. Moderate to severe aortic valve stenosis.   5. There is mild dilatation of the ascending aorta, measuring 39 mm.   6. The inferior vena cava is normal in size with greater than 50%  respiratory variability, suggesting right atrial pressure of 3 mmHg.   Laboratory Data:  High Sensitivity Troponin:   Recent Labs  Lab 07/03/22 1529 07/03/22 1826  TROPONINIHS 49* 114*      Chemistry Recent Labs  Lab 07/03/22 1529  NA 140  K 4.4  CL 101  CO2 27  GLUCOSE 99  BUN 22  CREATININE 1.08  CALCIUM 9.6  GFRNONAA >60  ANIONGAP 12    No results for input(s): "PROT", "ALBUMIN", "AST", "ALT", "ALKPHOS", "BILITOT" in the last 168 hours. Lipids No results for input(s): "CHOL", "TRIG", "HDL", "LABVLDL", "LDLCALC", "CHOLHDL" in the last 168 hours. Hematology Recent Labs  Lab 07/03/22 1529  WBC 6.0  RBC 5.32  HGB 17.0  HCT 47.0  MCV 88.3  MCH 32.0  MCHC 36.2*  RDW 13.3  PLT 150   Thyroid No results for input(s): "TSH", "FREET4" in the last 168 hours. BNP Recent Labs  Lab 07/03/22 1529  BNP 141.9*    DDimer No results for input(s): "DDIMER" in the last 168 hours.   Radiology/Studies:   DG Chest 2 View  Result Date: 07/03/2022 CLINICAL DATA:  Provided history: Chest pain. EXAM: CHEST - 2 VIEW COMPARISON:  Prior chest radiographs 11/25/2021 and earlier. FINDINGS: Stable cardiac and mediastinal contours as compared to the prior chest radiographs of 11/25/2021. Aortic atherosclerosis. Chronic elevation of the right hemidiaphragm. No appreciable airspace consolidation or pulmonary edema. No evidence of pleural effusion or pneumothorax. No acute osseous abnormality identified. Surgical clips within the upper abdomen. IMPRESSION: No evidence of acute cardiopulmonary abnormality. Aortic Atherosclerosis (ICD10-I70.0). Electronically Signed   By: Kellie Simmering D.O.   On: 07/03/2022 16:10     Assessment and Plan:   1. Crescendo angina, CAD, suspected NSTEMI - also likely driven by degree of aortic stenosis as well - will trend f/u troponin in AM to peak - continue ASA, Plavix - per d/w Lawson, OK to continue '81mg'$  instead of additional '324mg'$  loading dose given chronic DAPT use - add heparin per pharmacy given elevation in troponin - baseline bradycardia precludes addition of BB - see below re: lipid plan - plan [facility] tomorrow, added to add-on board, orders written  - Dr. Marlou Porch discussed procedural consent with patient who is agreeable, he feels standard IV fluids are OK  2. Moderate-severe aortic stenosis - plan for [facility] as above, on add on board - if severe, will need structural eval  3. Mild dilation of ascending aorta by echo 04/2022 - per d/w Lawson, no need to further eval at this time given only mild dimension (3m)  4. HTN - will manage in context above - please review pharm med rec in AM to resume ARB - pt unclear which one  5. Hyperlipidemia - historical poor tolerance of statins due to joint issues, maintained on Zetia and pravastatin 2x/week (sometimes less), declined bempedoic acid - check lipids/LFTs in AM - consider lipid clinic eval at follow-up  6. LBBB -  known  for pt  7. H/o edema, ? Possible chronic HFpEF - hold HCTZ pending cath with resumption plan TBD post discharge - reports no longer on Lasix  8. OSA - intolerant of CPAP  9. Habitual ETOH use - equivalent of approx 2 drinks of wine per night per patient - follow for withdrawal - discussed limiting use given bleeding risk with DAPT and refusal of blood products  Code status: Full code  Risk Assessment/Risk Scores:    TIMI Risk Score for Unstable Angina or Non-ST Elevation MI:   The patient's TIMI risk score is 6, which indicates a 41% risk of all cause mortality, new or recurrent myocardial infarction or need for urgent revascularization in the next 14 days.       Severity of Illness: The appropriate patient status for this patient is INPATIENT. Inpatient status is judged to be reasonable and necessary in order to provide the required intensity of service to ensure the patient's safety. The patient's presenting symptoms, physical exam findings, and initial radiographic and laboratory data in the context of their chronic comorbidities is felt to place them at high risk for further clinical deterioration. Furthermore, it is not anticipated that the patient will be medically stable for discharge from the hospital within 2 midnights of admission.   * I certify that at the point of admission it is my clinical judgment that the patient will require inpatient hospital care spanning beyond 2 midnights from the point of admission due to high intensity of service, high risk for further deterioration and high frequency of surveillance required.*   For questions or updates, please contact [place] Please consult [email or web address] for contact info under     Signed, Charlie Pitter, PA-C  07/03/2022 8:43 PM   Personally seen and examined. Agree with above.  77 year old [place] with known coronary artery disease details above here with progressive anginal symptoms, unstable  angina.  Troponin has increased since being here.  Nitroglycerin does not help.  Currently at rest he is pain-free.  Lab work reviewed, creatinine normal.  Hemoglobin normal.  Exam shows that he is alert and orient x 3 in no acute distress with regular rate and rhythm, 3/6 systolic murmur crescendo decrescendo, 1-2+ lower extremity edema.  2+ radial pulses.  Assessment and plan:  Coronary artery disease with previous PCI's, here with unstable angina - Will place on IV heparin.  Watch for any signs of bleeding especially given his Jehovah's Witness status.  Continue with aspirin and Plavix.  In the past he has required atherectomy he states.  It is certainly possible that he will have some degree of restenosis especially given his advancement of symptoms, chest discomfort as well as shortness of breath with activity.  He has moderate to severe aortic stenosis may be playing a role as well in his symptomatology. - We will proceed with right and left heart catheterization.  Risks and benefits have been described including stroke heart attack death renal pair of bleeding.  He is willing to proceed. - We will also assess his aortic valve.  Dr. Bettina Lawson has talked to him about the potential for TAVR valve in the future.  His wife was present for discussion.  She also reiterates his no blood products status.  Candee Furbish, Lawson

## 2022-07-03 NOTE — ED Triage Notes (Signed)
Pt came in via POV d/t CP & SOB with exertion the past couple months. Does have extensive cardiac Hx & was told that his conditioning was worsening. A/Ox4, denies pain while in triage. State that when it happens he has to sit down d/t the difficulty breathing & the burning CP, denies any radiation when CP occurs.

## 2022-07-04 ENCOUNTER — Encounter (HOSPITAL_COMMUNITY)
Admission: EM | Disposition: A | Discharge status: Home / Self Care | Payer: Self-pay | Source: Home / Self Care | Attending: Thoracic Surgery (Cardiothoracic Vascular Surgery)

## 2022-07-04 DIAGNOSIS — I25110 Atherosclerotic heart disease of native coronary artery with unstable angina pectoris: Secondary | ICD-10-CM

## 2022-07-04 DIAGNOSIS — I10 Essential (primary) hypertension: Secondary | ICD-10-CM

## 2022-07-04 DIAGNOSIS — I350 Nonrheumatic aortic (valve) stenosis: Secondary | ICD-10-CM

## 2022-07-04 DIAGNOSIS — I214 Non-ST elevation (NSTEMI) myocardial infarction: Secondary | ICD-10-CM | POA: Diagnosis not present

## 2022-07-04 DIAGNOSIS — E785 Hyperlipidemia, unspecified: Secondary | ICD-10-CM | POA: Diagnosis not present

## 2022-07-04 HISTORY — DX: Nonrheumatic aortic (valve) stenosis: I35.0

## 2022-07-04 HISTORY — PX: INTRAVASCULAR ULTRASOUND/IVUS: CATH118244

## 2022-07-04 HISTORY — PX: RIGHT/LEFT HEART CATH AND CORONARY ANGIOGRAPHY: CATH118266

## 2022-07-04 LAB — CBC
HCT: 46 % (ref 39.0–52.0)
Hemoglobin: 15.9 g/dL (ref 13.0–17.0)
MCH: 30.8 pg (ref 26.0–34.0)
MCHC: 34.6 g/dL (ref 30.0–36.0)
MCV: 89 fL (ref 80.0–100.0)
Platelets: 136 10*3/uL — ABNORMAL LOW (ref 150–400)
RBC: 5.17 MIL/uL (ref 4.22–5.81)
RDW: 13.3 % (ref 11.5–15.5)
WBC: 5.8 10*3/uL (ref 4.0–10.5)
nRBC: 0 % (ref 0.0–0.2)

## 2022-07-04 LAB — POCT I-STAT 7, (LYTES, BLD GAS, ICA,H+H)
Acid-base deficit: 1 mmol/L (ref 0.0–2.0)
Bicarbonate: 22.9 mmol/L (ref 20.0–28.0)
Calcium, Ion: 1.17 mmol/L (ref 1.15–1.40)
HCT: 44 % (ref 39.0–52.0)
Hemoglobin: 15 g/dL (ref 13.0–17.0)
O2 Saturation: 96 %
Potassium: 3.9 mmol/L (ref 3.5–5.1)
Sodium: 139 mmol/L (ref 135–145)
TCO2: 24 mmol/L (ref 22–32)
pCO2 arterial: 36.8 mmHg (ref 32–48)
pH, Arterial: 7.402 (ref 7.35–7.45)
pO2, Arterial: 85 mmHg (ref 83–108)

## 2022-07-04 LAB — LIPID PANEL
Cholesterol: 184 mg/dL (ref 0–200)
HDL: 37 mg/dL — ABNORMAL LOW (ref >40)
LDL Cholesterol: 108 mg/dL — ABNORMAL HIGH (ref 0–99)
Total CHOL/HDL Ratio: 5 RATIO
Triglycerides: 196 mg/dL — ABNORMAL HIGH (ref <150)
VLDL: 39 mg/dL (ref 0–40)

## 2022-07-04 LAB — BASIC METABOLIC PANEL
Anion gap: 12 (ref 5–15)
BUN: 20 mg/dL (ref 8–23)
CO2: 24 mmol/L (ref 22–32)
Calcium: 9.1 mg/dL (ref 8.9–10.3)
Chloride: 102 mmol/L (ref 98–111)
Creatinine, Ser: 1.06 mg/dL (ref 0.61–1.24)
GFR, Estimated: >60 mL/min (ref >60)
Glucose, Bld: 96 mg/dL (ref 70–99)
Potassium: 3.6 mmol/L (ref 3.5–5.1)
Sodium: 138 mmol/L (ref 135–145)

## 2022-07-04 LAB — POCT I-STAT EG7
Acid-base deficit: 2 mmol/L (ref 0.0–2.0)
Bicarbonate: 23.8 mmol/L (ref 20.0–28.0)
Calcium, Ion: 1.1 mmol/L — ABNORMAL LOW (ref 1.15–1.40)
HCT: 41 % (ref 39.0–52.0)
Hemoglobin: 13.9 g/dL (ref 13.0–17.0)
O2 Saturation: 68 %
Potassium: 3.5 mmol/L (ref 3.5–5.1)
Sodium: 143 mmol/L (ref 135–145)
TCO2: 25 mmol/L (ref 22–32)
pCO2, Ven: 43.8 mmHg — ABNORMAL LOW (ref 44–60)
pH, Ven: 7.343 (ref 7.25–7.43)
pO2, Ven: 37 mmHg (ref 32–45)

## 2022-07-04 LAB — TROPONIN I (HIGH SENSITIVITY): Troponin I (High Sensitivity): 86 ng/L — ABNORMAL HIGH (ref <18)

## 2022-07-04 LAB — HEPATIC FUNCTION PANEL
ALT: 37 U/L (ref 0–44)
AST: 26 U/L (ref 15–41)
Albumin: 3.6 g/dL (ref 3.5–5.0)
Alkaline Phosphatase: 21 U/L — ABNORMAL LOW (ref 38–126)
Bilirubin, Direct: 0.1 mg/dL (ref 0.0–0.2)
Indirect Bilirubin: 1.1 mg/dL — ABNORMAL HIGH (ref 0.3–0.9)
Total Bilirubin: 1.2 mg/dL (ref 0.3–1.2)
Total Protein: 6 g/dL — ABNORMAL LOW (ref 6.5–8.1)

## 2022-07-04 LAB — HEPARIN LEVEL (UNFRACTIONATED)
Heparin Unfractionated: <0.1 IU/mL — ABNORMAL LOW (ref 0.30–0.70)
Heparin Unfractionated: 0.32 IU/mL (ref 0.30–0.70)

## 2022-07-04 LAB — TSH: TSH: 4.966 u[IU]/mL — ABNORMAL HIGH (ref 0.350–4.500)

## 2022-07-04 LAB — POCT ACTIVATED CLOTTING TIME: Activated Clotting Time: 336 seconds

## 2022-07-04 SURGERY — RIGHT/LEFT HEART CATH AND CORONARY ANGIOGRAPHY
Anesthesia: LOCAL

## 2022-07-04 MED ORDER — HEPARIN SODIUM (PORCINE) 1000 UNIT/ML IJ SOLN
INTRAMUSCULAR | Status: AC
  Filled 2022-07-04: qty 10

## 2022-07-04 MED ORDER — SODIUM CHLORIDE 0.9 % IV SOLN: INTRAVENOUS | Status: AC

## 2022-07-04 MED ORDER — FUROSEMIDE 10 MG/ML IJ SOLN
40.0000 mg | Freq: Two times a day (BID) | INTRAMUSCULAR | Status: DC
  Administered 2022-07-04 – 2022-07-07 (×6): 40 mg via INTRAVENOUS
  Filled 2022-07-04 (×6): qty 4

## 2022-07-04 MED ORDER — SODIUM CHLORIDE 0.9% FLUSH
3.0000 mL | Freq: Two times a day (BID) | INTRAVENOUS | Status: DC
  Administered 2022-07-04: 3 mL via INTRAVENOUS

## 2022-07-04 MED ORDER — FENTANYL CITRATE (PF) 100 MCG/2ML IJ SOLN
INTRAMUSCULAR | Status: DC | PRN
  Administered 2022-07-04: 25 ug via INTRAVENOUS

## 2022-07-04 MED ORDER — HEPARIN SODIUM (PORCINE) 1000 UNIT/ML IJ SOLN
INTRAMUSCULAR | Status: DC | PRN
  Administered 2022-07-04 (×2): 6000 [IU] via INTRAVENOUS

## 2022-07-04 MED ORDER — HEPARIN (PORCINE) 25000 UT/250ML-% IV SOLN
1750.0000 [IU]/h | INTRAVENOUS | Status: DC
  Administered 2022-07-05 – 2022-07-06 (×3): 1550 [IU]/h via INTRAVENOUS
  Administered 2022-07-07: 1650 [IU]/h via INTRAVENOUS
  Administered 2022-07-07: 1550 [IU]/h via INTRAVENOUS
  Administered 2022-07-08 – 2022-07-10 (×5): 1750 [IU]/h via INTRAVENOUS
  Filled 2022-07-04 (×10): qty 250

## 2022-07-04 MED ORDER — LABETALOL HCL 5 MG/ML IV SOLN: 10.0000 mg | INTRAVENOUS | Status: AC | PRN

## 2022-07-04 MED ORDER — HEPARIN (PORCINE) IN NACL 1000-0.9 UT/500ML-% IV SOLN
INTRAVENOUS | Status: DC | PRN
  Administered 2022-07-04 (×2): 500 mL

## 2022-07-04 MED ORDER — IOHEXOL 350 MG/ML SOLN
INTRAVENOUS | Status: DC | PRN
  Administered 2022-07-04: 70 mL

## 2022-07-04 MED ORDER — LIDOCAINE HCL (PF) 1 % IJ SOLN
INTRAMUSCULAR | Status: AC
  Filled 2022-07-04: qty 30

## 2022-07-04 MED ORDER — MIDAZOLAM HCL 2 MG/2ML IJ SOLN
INTRAMUSCULAR | Status: DC | PRN
  Administered 2022-07-04: 1 mg via INTRAVENOUS

## 2022-07-04 MED ORDER — MIDAZOLAM HCL 2 MG/2ML IJ SOLN
INTRAMUSCULAR | Status: AC
  Filled 2022-07-04: qty 2

## 2022-07-04 MED ORDER — FENTANYL CITRATE (PF) 100 MCG/2ML IJ SOLN
INTRAMUSCULAR | Status: AC
  Filled 2022-07-04: qty 2

## 2022-07-04 MED ORDER — VERAPAMIL HCL 2.5 MG/ML IV SOLN
INTRAVENOUS | Status: AC
  Filled 2022-07-04: qty 2

## 2022-07-04 MED ORDER — HEPARIN BOLUS VIA INFUSION
3000.0000 [IU] | Freq: Once | INTRAVENOUS | Status: AC
  Administered 2022-07-04: 3000 [IU] via INTRAVENOUS
  Filled 2022-07-04: qty 3000

## 2022-07-04 MED ORDER — SODIUM CHLORIDE 0.9 % IV SOLN: 250.0000 mL | INTRAVENOUS | Status: DC | PRN

## 2022-07-04 MED ORDER — HYDRALAZINE HCL 20 MG/ML IJ SOLN: 10.0000 mg | INTRAMUSCULAR | Status: AC | PRN

## 2022-07-04 MED ORDER — LIDOCAINE HCL (PF) 1 % IJ SOLN
INTRAMUSCULAR | Status: DC | PRN
  Administered 2022-07-04 (×2): 2 mL

## 2022-07-04 MED ORDER — VERAPAMIL HCL 2.5 MG/ML IV SOLN
INTRAVENOUS | Status: DC | PRN
  Administered 2022-07-04: 10 mL via INTRA_ARTERIAL

## 2022-07-04 MED ORDER — SODIUM CHLORIDE 0.9% FLUSH: 3.0000 mL | INTRAVENOUS | Status: DC | PRN

## 2022-07-04 SURGICAL SUPPLY — 19 items
CATH 5FR JL3.5 JR4 ANG PIG MP (CATHETERS) IMPLANT
CATH BALLN WEDGE 5F 110CM (CATHETERS) IMPLANT
CATH OPTICROSS HD (CATHETERS) IMPLANT
CATH VISTA GUIDE 6FR XBLAD3.5 (CATHETERS) IMPLANT
CATH VISTA GUIDE 6FR XBLAD4 (CATHETERS) IMPLANT
DEVICE RAD COMP TR BAND LRG (VASCULAR PRODUCTS) IMPLANT
GLIDESHEATH SLEND SS 6F .021 (SHEATH) IMPLANT
GUIDEWIRE .025 260CM (WIRE) IMPLANT
GUIDEWIRE INQWIRE 1.5J.035X260 (WIRE) IMPLANT
INQWIRE 1.5J .035X260CM (WIRE) ×1
KIT ESSENTIALS PG (KITS) IMPLANT
KIT HEART LEFT (KITS) ×1 IMPLANT
PACK CARDIAC CATHETERIZATION (CUSTOM PROCEDURE TRAY) ×1 IMPLANT
SHEATH GLIDE SLENDER 4/5FR (SHEATH) IMPLANT
SLED PULL BACK IVUS (MISCELLANEOUS) IMPLANT
TRANSDUCER W/STOPCOCK (MISCELLANEOUS) ×1 IMPLANT
TUBING CIL FLEX 10 FLL-RA (TUBING) ×1 IMPLANT
WIRE COUGAR XT STRL 190CM (WIRE) IMPLANT
WIRE EMERALD 3MM-J .025X260CM (WIRE) IMPLANT

## 2022-07-04 NOTE — Interval H&P Note (Signed)
History and Physical Interval Note:  07/04/2022 4:49 PM  Craig Lawson  has presented today for surgery, with the diagnosis of NSTEMI.  The various methods of treatment have been discussed with the patient and family. After consideration of risks, benefits and other options for treatment, the patient has consented to  Procedure(s): RIGHT/LEFT HEART CATH AND CORONARY ANGIOGRAPHY (N/A) as a surgical intervention.  The patient's history has been reviewed, patient examined, no change in status, stable for surgery.  I have reviewed the patient's chart and labs.  Questions were answered to the patient's satisfaction.    Cath Lab Visit (complete for each Cath Lab visit)  Clinical Evaluation Leading to the Procedure:   ACS: Yes.    Non-ACS:    Anginal Classification: CCS III  Anti-ischemic medical therapy: No Therapy  Non-Invasive Test Results: No non-invasive testing performed  Prior CABG: No previous CABG        Lauree Chandler

## 2022-07-04 NOTE — Progress Notes (Signed)
TR BAND REMOVAL  LOCATION:    right radial  DEFLATED PER PROTOCOL:    Yes.    TIME BAND OFF / DRESSING APPLIED:    2130   SITE UPON ARRIVAL:    Level 0  SITE AFTER BAND REMOVAL:    Level 0  CIRCULATION SENSATION AND MOVEMENT:    Within Normal Limits   Yes.    COMMENTS:   sterile dressing applied, good capillary refill, pt instructed post cath site care and verbalized understanding.

## 2022-07-04 NOTE — Progress Notes (Signed)
[place] for heparin  Indication: chest pain/ACS  Allergies  Allergen Reactions   Penicillins     Had rash in his 30s-40s with this as well as recurrence when re-trialed   Statins Other (See Comments)    Muscle pains; increasing with continued use.   Muscle pains; increasing with continued use.    Isosorbide Other (See Comments)    "makes me dizzy and I have headaches"   Latex Rash    Has noticed with bandages in past, leaves a sore on skin but more recently with APAP mask     Patient Measurements: Height: 5\' 9"  (175.3 cm) Weight: 117.9 kg (260 lb) IBW/kg (Calculated) : 70.7 Heparin Dosing Weight: 97.2kg   Vital Signs: Temp: 97.7 F (36.5 C) (03/15 0825) Temp Source: Oral (03/15 0825) BP: 139/88 (03/15 1000) Pulse Rate: 62 (03/15 1000)  Labs: Recent Labs    07/03/22 1529 07/03/22 1826 07/04/22 0258 07/04/22 0620 07/04/22 1130  HGB 17.0  --  15.9  --   --   HCT 47.0  --  46.0  --   --   PLT 150  --  136*  --   --   HEPARINUNFRC  --   --  <0.10*  --  0.32  CREATININE 1.08  --  1.06  --   --   TROPONINIHS 49* 114*  --  86*  --      Estimated Creatinine Clearance: 72.8 mL/min (by C-G formula based on SCr of 1.06 mg/dL).   Medical History: Past Medical History:  Diagnosis Date   Aortic stenosis 08/10/2015   Bilateral leg edema 08/22/2015   Bradycardia 06/01/2017   Cellulitis of left lower extremity 04/29/2017   Coronary artery disease involving native coronary artery 10/22/2017   DOE (dyspnea on exertion) 05/04/2017   Essential hypertension 08/13/2015   Heart murmur 04/29/2017   Kissing, osteophytes 08/22/2015   LBBB (left bundle branch block) 08/22/2015   Lipid disorder 11/30/2017   Lumbar facet joint pain 08/22/2015   Medicare annual wellness visit, subsequent 06/02/2016   Morbid obesity ([place]) 10/22/2017   Murmur 04/29/2017   Myopathy 09/02/2019   Formatting of this note might be different from the original.  Secondary to high freq statin use.   Need for vaccination for Strep pneumoniae 03/27/2017   Non-ST elevation myocardial infarction (NSTEMI), subendocardial infarction, subsequent episode of care ([place]) 11/30/2017   Obesity, Class II, BMI 35-39.9 08/10/2015   OSA on CPAP 12/02/2017   Pure hypercholesterolemia 05/24/2015   Seborrheic keratoses 03/27/2017   Overview:  Of the right forehead.   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 Trop elevation to 113. CBC stable. Not on anticoag PTA. Pharmacy consulted to dose heparin.   Heparin level therapeutic s/p rate increase to 1500 units/hr  Goal of Therapy:  Heparin level 0.3-0.7 units/ml Monitor platelets by anticoagulation protocol: Yes   Plan:  Continue heparin gtt at 1500 units/hr Daily heparin level, CBC, s/s bleeding F/u cath   Bertis Ruddy, PharmD, [place] Pharmacist ED Pharmacist Phone # [phone] 07/04/2022 12:26 PM

## 2022-07-04 NOTE — H&P (View-Only) (Signed)
Rounding Note    Patient Name: Craig Lawson Date of Encounter: 07/04/2022  [place] Cardiologist: Shirlee More, MD   Subjective   Feeling better, no active chest pain. Waiting for cath  Inpatient Medications    Scheduled Meds:  aspirin EC  81 mg Oral QHS   clopidogrel  75 mg Oral QHS   ezetimibe  10 mg Oral QHS   pravastatin  20 mg Oral Once per day on Thu Fri   sodium chloride flush  3 mL Intravenous Q12H   sodium chloride flush  3 mL Intravenous Q12H   Continuous Infusions:  sodium chloride     sodium chloride     sodium chloride 1 mL/kg/hr (07/04/22 0746)   heparin 1,500 Units/hr (07/04/22 0746)   PRN Meds: sodium chloride, sodium chloride, acetaminophen, ondansetron (ZOFRAN) IV, sodium chloride flush, sodium chloride flush   Vital Signs    Vitals:   07/04/22 0500 07/04/22 0600 07/04/22 0700 07/04/22 0825  BP: 136/65 124/71 (!) 141/76   Pulse: (!) 52 (!) 48 64   Resp: 15 18 20    Temp:    97.7 F (36.5 C)  TempSrc:    Oral  SpO2: (!) 89% (!) 88% 92%   Weight:      Height:        Intake/Output Summary (Last 24 hours) at 07/04/2022 0849 Last data filed at 07/04/2022 0517 Gross per 24 hour  Intake 353.7 ml  Output --  Net 353.7 ml      07/03/2022    6:34 PM 11/11/2021    8:17 AM 05/13/2021    9:27 AM  Last 3 Weights  Weight (lbs) 260 lb 265 lb 261 lb  Weight (kg) 117.935 kg 120.203 kg 118.389 kg      Telemetry    NSR 70 - Personally Reviewed  ECG    No new - Personally Reviewed  Physical Exam   GEN: No acute distress.   Neck: No JVD Cardiac: RRR, 3/6 SM,no rubs, or gallops.  Respiratory: Clear to auscultation bilaterally. GI: Soft, nontender, non-distended  MS: No edema; No deformity. Neuro:  Nonfocal  Psych: Normal affect   Labs    High Sensitivity Troponin:   Recent Labs  Lab 07/03/22 1529 07/03/22 1826 07/04/22 0620  TROPONINIHS 49* 114* 86*     Chemistry Recent Labs  Lab 07/03/22 1529 07/04/22 0258  NA  140 138  K 4.4 3.6  CL 101 102  CO2 27 24  GLUCOSE 99 96  BUN 22 20  CREATININE 1.08 1.06  CALCIUM 9.6 9.1  PROT  --  6.0*  ALBUMIN  --  3.6  AST  --  26  ALT  --  37  ALKPHOS  --  21*  BILITOT  --  1.2  GFRNONAA >60 >60  ANIONGAP 12 12    Lipids  Recent Labs  Lab 07/04/22 0258  CHOL 184  TRIG 196*  HDL 37*  LDLCALC 108*  CHOLHDL 5.0    Hematology Recent Labs  Lab 07/03/22 1529 07/04/22 0258  WBC 6.0 5.8  RBC 5.32 5.17  HGB 17.0 15.9  HCT 47.0 46.0  MCV 88.3 89.0  MCH 32.0 30.8  MCHC 36.2* 34.6  RDW 13.3 13.3  PLT 150 136*   Thyroid  Recent Labs  Lab 07/04/22 0258  TSH 4.966*    BNP Recent Labs  Lab 07/03/22 1529  BNP 141.9*    DDimer No results for input(s): "DDIMER" in the last 168 hours.  Radiology    DG Chest 2 View  Result Date: 07/03/2022 CLINICAL DATA:  Provided history: Chest pain. EXAM: CHEST - 2 VIEW COMPARISON:  Prior chest radiographs 11/25/2021 and earlier. FINDINGS: Stable cardiac and mediastinal contours as compared to the prior chest radiographs of 11/25/2021. Aortic atherosclerosis. Chronic elevation of the right hemidiaphragm. No appreciable airspace consolidation or pulmonary edema. No evidence of pleural effusion or pneumothorax. No acute osseous abnormality identified. Surgical clips within the upper abdomen. IMPRESSION: No evidence of acute cardiopulmonary abnormality. Aortic Atherosclerosis (ICD10-I70.0). Electronically Signed   By: Kellie Simmering D.O.   On: 07/03/2022 16:10    Cardiac Studies   Cardiac Studies & Procedures   CARDIAC CATHETERIZATION  CARDIAC CATHETERIZATION 08/16/2019  Narrative  2nd Diag lesion is 80% stenosed.  Balloon angioplasty was performed using a BALLOON SAPPHIRE 2.0X12.  Post intervention, there is a 25% residual stenosis.  Prox LAD to Mid LAD lesion is 70% stenosed.  A drug-eluting stent was successfully placed using a SYNERGY XD 2.75X38, after orbital atherectomy, dilated to 3.25 mm. Stent  size optimized with IVUS.  Post intervention, there is a 0% residual stenosis.  Ost Cx to Prox Cx lesion is 50% stenosed.  Dist RCA lesion is 50% stenosed.  RPDA stent that was treated a few weeks ago was widely patent.  1st Diag lesion is 75% stenosed.  LV end diastolic pressure is normal.  There is mild aortic valve stenosis. Mild gradient noted on pullback.  Continue aggressive secondary prevention.  Would recommend clopidogrel monotherapy after 6 months of DAPT given his extensive disease.  Given the residual pain from the jailed diagonals, will watch overnight and treat with pain medication.  Findings Coronary Findings Diagnostic  Dominance: Right  Left Anterior Descending Prox LAD to Mid LAD lesion is 70% stenosed. The lesion was previously treated. IVUS would not cross  First Diagonal Branch 1st Diag lesion is 75% stenosed.  Second Diagonal Branch 2nd Diag lesion is 80% stenosed.  Left Circumflex Ost Cx to Prox Cx lesion is 50% stenosed.  Right Coronary Artery Dist RCA lesion is 50% stenosed.  Right Posterior Descending Artery Non-stenotic RPDA lesion. stent appeared underexpanded.  Vessel appeared to be 3.0 mm in diameter.  Intervention  Prox LAD to Mid LAD lesion Atherectomy CATH LAUNCHER 6FR EBU 3.75 guide catheter was inserted. WIRE VIPERWIRE COR FLEX TIP guidewire was used to cross lesion. Orbital atherectomy was performed using a CROWN DIAMONDBACK CLASSIC 1.25. Stent CATH LAUNCHER 6FR EBU 3.75 guide catheter was inserted. Lesion crossed with guidewire using a WIRE ASAHI PROWATER 180CM. Pre-stent angioplasty was performed using a BALLOON SAPPHIRE 2.5X20. A drug-eluting stent was successfully placed using a SYNERGY XD 2.75X38. Stent strut is well apposed. Post-stent angioplasty was performed using a BALLOON SAPPHIRE [place] 3.25X15. Pre stent IVUS after athetectomy showed one focal area of concentric calcium that was treated with the 2.5 balloon.   THere was  difficulty delivering the stent requiring buddy wires.Post stent IVUS showed the stent was well apposed, but there was significant underlying calcium that affected the shape of the stent. Post-Intervention Lesion Assessment The intervention was successful. Pre-interventional TIMI flow is 3. Post-intervention TIMI flow is 3. No complications occurred at this lesion. Ultrasound (IVUS) was performed on the lesion post PCI. Stent well apposed. There is a 0% residual stenosis post intervention.  2nd Diag lesion Angioplasty CATH LAUNCHER 6FR EBU 3.75 guide catheter was inserted. WIRE HI TORQ BMW 190CM guidewire used to cross lesion. Balloon angioplasty was performed using a BALLOON  SAPPHIRE 2.0X12. Maximum pressure: 8 atm. Post-Intervention Lesion Assessment The intervention was successful. Pre-interventional TIMI flow is 3. Post-intervention TIMI flow is 3. No complications occurred at this lesion. There is a 25% residual stenosis post intervention.   CARDIAC CATHETERIZATION  CARDIAC CATHETERIZATION 08/15/2019  Narrative  Dist RCA lesion is 50% stenosed.  Ost Cx to Prox Cx lesion is 50% stenosed.  Mid LAD lesion is 90% stenosed. Balloon angioplasty was performed using a BALLOON SAPPHIRE 2.0X15.  Post intervention, there is a 70% residual stenosis. Rigid lesion which will require atherectomy. No visible dissection but since the lesion was modified, will delay atherectomy.  2nd Diag lesion is 80% stenosed.  RPDA lesion is 75% stenosed. Scoring balloon angioplasty was performed using a BALLOON WOLVERINE 2.75X10.  Post intervention, there is a 0% residual stenosis.  The left ventricular systolic function is normal.  LV end diastolic pressure is normal.  The left ventricular ejection fraction is 55-65% by visual estimate.  There is mild aortic valve stenosis.  Scoring balloon angioplasty was performed using a BALLOON WOLVERINE 2.75X10.  Balloon angioplasty was performed using a  BALLOON SAPPHIRE 2.0X15.  Need to bring patient back for elective PCI of the LAD with atherectomy, in 2-3 weeks.  Findings Coronary Findings Diagnostic  Dominance: Right  Left Anterior Descending Mid LAD lesion is 90% stenosed. IVUS would not cross  Second Diagonal Branch 2nd Diag lesion is 80% stenosed.  Left Circumflex Ost Cx to Prox Cx lesion is 50% stenosed.  Right Coronary Artery Dist RCA lesion is 50% stenosed. Ultrasound (IVUS) was performed. Minimum lumen diameter: 3.7 mm. Moderate plaque burden was detected. IVUS has determined that the lesion is calcified.  Right Posterior Descending Artery RPDA lesion is 75% stenosed. The lesion was previously treated. Ultrasound (IVUS) was performed. Severe plaque burden was detected. stent appeared underexpanded.  Vessel appeared to be 3.0 mm in diameter.  Intervention  Mid LAD lesion Angioplasty CATH LAUNCHER 6FR EBU3.5 guide catheter was inserted. WIRE HI TORQ BMW 190CM guidewire used to cross lesion. Balloon angioplasty was performed using a BALLOON SAPPHIRE 2.0X15. [place] to protect diagonal Post-Intervention Lesion Assessment The intervention was unsuccessful due to lesion rigidity. Pre-interventional TIMI flow is 3. Post-intervention TIMI flow is 3. No complications occurred at this lesion. IVUS still would not cross after PTCA. There is a 70% residual stenosis post intervention.  RPDA lesion Angioplasty CATH [place]IST[place] GUIDE 6FR JR4 guide catheter was inserted. WIRE ASAHI PROWATER 180CM guidewire used to cross lesion. Scoring balloon angioplasty was performed using a BALLOON WOLVERINE 2.75X10. Post IVUS images show a well apposed stent Post-Intervention Lesion Assessment The intervention was successful. Pre-interventional TIMI flow is 3. Post-intervention TIMI flow is 3. No complications occurred at this lesion. There is a 0% residual stenosis post intervention.   STRESS TESTS  MYOCARDIAL PERFUSION IMAGING  12/07/2018  Narrative  The left ventricular ejection fraction is mildly decreased (45-54%).  Nuclear stress EF: 51%.  There was no ST segment deviation noted during stress.  The study is normal.  This is a low risk study.   ECHOCARDIOGRAM  ECHOCARDIOGRAM COMPLETE 04/22/2022  Narrative ECHOCARDIOGRAM REPORT    Patient Name:   Craig Lawson Date of Exam: 04/22/2022 Medical Rec #:  [id number]     Height:       69.0 in Accession #:    [id number]    Weight:       265.0 lb Date of Birth:  Dec 21, 1945      BSA:  2.328 m Patient Age:    6 years      BP:           134/70 mmHg Patient Gender: M             HR:           57 bpm. Exam Location:  [facility]  Procedure: 2D Echo, Cardiac Doppler, Color Doppler and Strain Analysis  Indications:    Nonrheumatic aortic valve stenosis [I35.0 (ICD-10-CM)]  History:        Patient has prior history of Echocardiogram examinations, most recent 05/20/2021. CAD, Aortic Valve Disease, Arrythmias:Bradycardia and LBBB, Signs/Symptoms:o; Risk Factors:Hypertension and Dyslipidemia.  Sonographer:    Luane School RDCS Referring Phys: [id number] [place]   Technically difficult study due to poor echo windows. IMPRESSIONS   1. Left ventricular ejection fraction, by estimation, is 50 to 55%. The left ventricle has low normal function. Left ventricular endocardial border not optimally defined to evaluate regional wall motion. There is moderate concentric left ventricular hypertrophy. Left ventricular diastolic parameters are consistent with Grade I diastolic dysfunction (impaired relaxation). 2. Right ventricular systolic function is normal. The right ventricular size is normal. Tricuspid regurgitation signal is inadequate for assessing PA pressure. 3. The mitral valve is normal in structure. No evidence of mitral valve regurgitation. No evidence of mitral stenosis. 4. Reduced SVI . The aortic valve is tricuspid. There is mild calcification of the  aortic valve. There is mild thickening of the aortic valve. Aortic valve regurgitation is not visualized. Moderate to severe aortic valve stenosis. 5. There is mild dilatation of the ascending aorta, measuring 39 mm. 6. The inferior vena cava is normal in size with greater than 50% respiratory variability, suggesting right atrial pressure of 3 mmHg.  FINDINGS Left Ventricle: Left ventricular ejection fraction, by estimation, is 50 to 55%. The left ventricle has low normal function. Left ventricular endocardial border not optimally defined to evaluate regional wall motion. Global longitudinal strain performed but not reported based on interpreter judgement due to suboptimal tracking. The left ventricular internal cavity size was normal in size. There is moderate concentric left ventricular hypertrophy. Left ventricular diastolic parameters are consistent with Grade I diastolic dysfunction (impaired relaxation). Indeterminate filling pressures.  Right Ventricle: The right ventricular size is normal. No increase in right ventricular wall thickness. Right ventricular systolic function is normal. Tricuspid regurgitation signal is inadequate for assessing PA pressure.  Left Atrium: Left atrial size was normal in size.  Right Atrium: Right atrial size was normal in size.  Pericardium: There is no evidence of pericardial effusion.  Mitral Valve: The mitral valve is normal in structure. No evidence of mitral valve regurgitation. No evidence of mitral valve stenosis.  Tricuspid Valve: The tricuspid valve is normal in structure. Tricuspid valve regurgitation is not demonstrated. No evidence of tricuspid stenosis.  Aortic Valve: Reduced SVI. The aortic valve is tricuspid. There is mild calcification of the aortic valve. There is mild thickening of the aortic valve. Aortic valve regurgitation is not visualized. Moderate to severe aortic stenosis is present. Aortic valve mean gradient measures 32.0 mmHg.  Aortic valve peak gradient measures 49.3 mmHg. Aortic valve area, by VTI measures 0.84 cm.  Pulmonic Valve: The pulmonic valve was normal in structure. Pulmonic valve regurgitation is not visualized. No evidence of pulmonic stenosis.  Aorta: The aortic root is normal in size and structure and the aortic arch was not well visualized. There is mild dilatation of the ascending aorta, measuring 39 mm.  Venous: The pulmonary veins were not well visualized. The inferior vena cava was not well visualized. The inferior vena cava is normal in size with greater than 50% respiratory variability, suggesting right atrial pressure of 3 mmHg.  IAS/Shunts: No atrial level shunt detected by color flow Doppler.   LEFT VENTRICLE PLAX 2D LVIDd:         5.60 cm     Diastology LVIDs:         4.10 cm     LV e' medial:    3.97 cm/s LV PW:         1.50 cm     LV E/e' medial:  23.6 LV IVS:        1.50 cm     LV e' lateral:   5.11 cm/s LVOT diam:     2.10 cm     LV E/e' lateral: 18.4 LV SV:         75 LV SV Index:   32 LVOT Area:     3.46 cm  LV Volumes (MOD) LV vol d, MOD A2C: 76.1 ml LV vol d, MOD A4C: 99.5 ml LV vol s, MOD A2C: 36.7 ml LV vol s, MOD A4C: 52.4 ml LV SV MOD A2C:     39.4 ml LV SV MOD A4C:     99.5 ml LV SV MOD BP:      45.4 ml  RIGHT VENTRICLE RV S prime:     11.30 cm/s TAPSE (M-mode): 2.7 cm  LEFT ATRIUM           Index        RIGHT ATRIUM           Index LA diam:      5.00 cm 2.15 cm/m   RA Area:     18.20 cm LA Vol (A2C): 85.4 ml 36.68 ml/m  RA Volume:   50.00 ml  21.47 ml/m LA Vol (A4C): 65.1 ml 27.96 ml/m AORTIC VALVE AV Area (Vmax):    0.91 cm AV Area (Vmean):   0.82 cm AV Area (VTI):     0.84 cm AV Vmax:           351.00 cm/s AV Vmean:          266.000 cm/s AV VTI:            0.888 m AV Peak Grad:      49.3 mmHg AV Mean Grad:      32.0 mmHg LVOT Vmax:         92.00 cm/s LVOT Vmean:        62.900 cm/s LVOT VTI:          0.216 m LVOT/AV VTI ratio:  0.24  AORTA Ao Root diam: 3.20 cm Ao Asc diam:  3.90 cm  MITRAL VALVE               TRICUSPID VALVE MV Area (PHT): 2.38 cm    TR Peak grad:   6.2 mmHg MV Decel Time: 319 msec    TR Vmax:        124.00 cm/s MV E velocity: 93.80 cm/s MV A velocity: 94.30 cm/s  SHUNTS MV E/A ratio:  0.99        Systemic VTI:  0.22 m Systemic Diam: 2.10 cm  Shirlee More MD Electronically signed by Shirlee More MD Signature Date/Time: 04/22/2022/12:52:15 PM    Final    MONITORS  LONG TERM MONITOR (3-14 DAYS) 10/25/2018  Narrative A ZIO monitor was applied for  3 days to assess palpitation following MI beginning 10/12/2018.  Rhythm throughout was sinus with bundle branch block minimum average and maximum heart rates of 41, 55 and 125 bpm.  There were no pauses of 3 seconds or greater and no episodes of sinus node or AV nodal block.  There were 3 triggered and 3 diary events predominantly associated with PVCs.  Ventricular ectopy was rare with isolated PVCs.  Supraventricular ectopy was rare there were no episodes of atrial fibrillation or flutter.  There were 3 brief episodes of runs of atrial premature contractions the longest 6 complexes at a rate of 113 bpm.   Conclusion symptomatic PVCs, however PVC burden is low and the occurrence of palpitation is infrequent            Patient Profile     77 y.o. male male Jehovah's witness (blood product refusal) with CAD (DES to PDA and DES to OM2 at [place], PTCA of ISR of PDA and attempted unusccessful angioplasty to LAD in 07/2019 with staged intervention with PTCA to D2, PTCA/DES/orbital atherectomy to LAD), moderate-severe AS by echo 04/2022, chronic edema, mild dilation of ascending aorta, LBBB, HTN, HLD (declines statins due to joint pain), morbid obesity with OSA intolerant of CPAP, possible prior TIAs (improved after initiation of Plavix per pt), habitual ETOH use (max ~2 glasses of wine nightly) who is being seen 07/03/2022 for the evaluation of  progressive angina   Assessment & Plan    Crescendo angina, CAD, suspected NSTEMI, Jehovah's Witness-no blood products - also likely driven by moderate to severe degree of aortic stenosis as well - troponin is now down - continue ASA, Plavix - heparin per pharmacy given elevation in troponin - baseline bradycardia precludes addition of BB - see below re: lipid plan - R/LHC, eval AS as well, added to add-on board, orders written    Moderate-severe aortic stenosis - plan for [facility] as above, on add on board - if severe, will need structural eval. Knows about TAVR   Borderline dilation of ascending aorta by echo 04/2022 - (70mm)   HTN - will manage in context above - resume ARB - pt unclear which one   Hyperlipidemia - historical poor tolerance of statins due to joint issues, maintained on Zetia and pravastatin 2x/week (sometimes less), declined bempedoic acid - LDL 108 - consider lipid clinic eval at follow-up   LBBB -  known for pt   H/o edema, ? Possible chronic HFpEF - hold HCTZ pending cath with resumption plan TBD post discharge - reports no longer on Lasix   OSA - intolerant of CPAP   Habitual ETOH use - equivalent of approx 2 drinks of wine per night per patient - follow for withdrawal - discussed limiting use given bleeding risk with DAPT and refusal of blood products  For questions or updates, please contact [place] Please consult [email or web address] for contact info under        Signed, Candee Furbish, MD  07/04/2022, 8:49 AM

## 2022-07-04 NOTE — Progress Notes (Signed)
Rounding Note    Patient Name: Craig Lawson Date of Encounter: 07/04/2022  [place] Cardiologist: Shirlee More, MD   Subjective   Feeling better, no active chest pain. Waiting for cath  Inpatient Medications    Scheduled Meds:  aspirin EC  81 mg Oral QHS   clopidogrel  75 mg Oral QHS   ezetimibe  10 mg Oral QHS   pravastatin  20 mg Oral Once per day on Thu Fri   sodium chloride flush  3 mL Intravenous Q12H   sodium chloride flush  3 mL Intravenous Q12H   Continuous Infusions:  sodium chloride     sodium chloride     sodium chloride 1 mL/kg/hr (07/04/22 0746)   heparin 1,500 Units/hr (07/04/22 0746)   PRN Meds: sodium chloride, sodium chloride, acetaminophen, ondansetron (ZOFRAN) IV, sodium chloride flush, sodium chloride flush   Vital Signs    Vitals:   07/04/22 0500 07/04/22 0600 07/04/22 0700 07/04/22 0825  BP: 136/65 124/71 (!) 141/76   Pulse: (!) 52 (!) 48 64   Resp: 15 18 20    Temp:    97.7 F (36.5 C)  TempSrc:    Oral  SpO2: (!) 89% (!) 88% 92%   Weight:      Height:        Intake/Output Summary (Last 24 hours) at 07/04/2022 0849 Last data filed at 07/04/2022 0517 Gross per 24 hour  Intake 353.7 ml  Output --  Net 353.7 ml      07/03/2022    6:34 PM 11/11/2021    8:17 AM 05/13/2021    9:27 AM  Last 3 Weights  Weight (lbs) 260 lb 265 lb 261 lb  Weight (kg) 117.935 kg 120.203 kg 118.389 kg      Telemetry    NSR 70 - Personally Reviewed  ECG    No new - Personally Reviewed  Physical Exam   GEN: No acute distress.   Neck: No JVD Cardiac: RRR, 3/6 SM,no rubs, or gallops.  Respiratory: Clear to auscultation bilaterally. GI: Soft, nontender, non-distended  MS: No edema; No deformity. Neuro:  Nonfocal  Psych: Normal affect   Labs    High Sensitivity Troponin:   Recent Labs  Lab 07/03/22 1529 07/03/22 1826 07/04/22 0620  TROPONINIHS 49* 114* 86*     Chemistry Recent Labs  Lab 07/03/22 1529 07/04/22 0258  NA  140 138  K 4.4 3.6  CL 101 102  CO2 27 24  GLUCOSE 99 96  BUN 22 20  CREATININE 1.08 1.06  CALCIUM 9.6 9.1  PROT  --  6.0*  ALBUMIN  --  3.6  AST  --  26  ALT  --  37  ALKPHOS  --  21*  BILITOT  --  1.2  GFRNONAA >60 >60  ANIONGAP 12 12    Lipids  Recent Labs  Lab 07/04/22 0258  CHOL 184  TRIG 196*  HDL 37*  LDLCALC 108*  CHOLHDL 5.0    Hematology Recent Labs  Lab 07/03/22 1529 07/04/22 0258  WBC 6.0 5.8  RBC 5.32 5.17  HGB 17.0 15.9  HCT 47.0 46.0  MCV 88.3 89.0  MCH 32.0 30.8  MCHC 36.2* 34.6  RDW 13.3 13.3  PLT 150 136*   Thyroid  Recent Labs  Lab 07/04/22 0258  TSH 4.966*    BNP Recent Labs  Lab 07/03/22 1529  BNP 141.9*    DDimer No results for input(s): "DDIMER" in the last 168 hours.  Radiology    DG Chest 2 View  Result Date: 07/03/2022 CLINICAL DATA:  Provided history: Chest pain. EXAM: CHEST - 2 VIEW COMPARISON:  Prior chest radiographs 11/25/2021 and earlier. FINDINGS: Stable cardiac and mediastinal contours as compared to the prior chest radiographs of 11/25/2021. Aortic atherosclerosis. Chronic elevation of the right hemidiaphragm. No appreciable airspace consolidation or pulmonary edema. No evidence of pleural effusion or pneumothorax. No acute osseous abnormality identified. Surgical clips within the upper abdomen. IMPRESSION: No evidence of acute cardiopulmonary abnormality. Aortic Atherosclerosis (ICD10-I70.0). Electronically Signed   By: Kellie Simmering D.O.   On: 07/03/2022 16:10    Cardiac Studies   Cardiac Studies & Procedures   CARDIAC CATHETERIZATION  CARDIAC CATHETERIZATION 08/16/2019  Narrative  2nd Diag lesion is 80% stenosed.  Balloon angioplasty was performed using a BALLOON SAPPHIRE 2.0X12.  Post intervention, there is a 25% residual stenosis.  Prox LAD to Mid LAD lesion is 70% stenosed.  A drug-eluting stent was successfully placed using a SYNERGY XD 2.75X38, after orbital atherectomy, dilated to 3.25 mm. Stent  size optimized with IVUS.  Post intervention, there is a 0% residual stenosis.  Ost Cx to Prox Cx lesion is 50% stenosed.  Dist RCA lesion is 50% stenosed.  RPDA stent that was treated a few weeks ago was widely patent.  1st Diag lesion is 75% stenosed.  LV end diastolic pressure is normal.  There is mild aortic valve stenosis. Mild gradient noted on pullback.  Continue aggressive secondary prevention.  Would recommend clopidogrel monotherapy after 6 months of DAPT given his extensive disease.  Given the residual pain from the jailed diagonals, will watch overnight and treat with pain medication.  Findings Coronary Findings Diagnostic  Dominance: Right  Left Anterior Descending Prox LAD to Mid LAD lesion is 70% stenosed. The lesion was previously treated. IVUS would not cross  First Diagonal Branch 1st Diag lesion is 75% stenosed.  Second Diagonal Branch 2nd Diag lesion is 80% stenosed.  Left Circumflex Ost Cx to Prox Cx lesion is 50% stenosed.  Right Coronary Artery Dist RCA lesion is 50% stenosed.  Right Posterior Descending Artery Non-stenotic RPDA lesion. stent appeared underexpanded.  Vessel appeared to be 3.0 mm in diameter.  Intervention  Prox LAD to Mid LAD lesion Atherectomy CATH LAUNCHER 6FR EBU 3.75 guide catheter was inserted. WIRE VIPERWIRE COR FLEX TIP guidewire was used to cross lesion. Orbital atherectomy was performed using a CROWN DIAMONDBACK CLASSIC 1.25. Stent CATH LAUNCHER 6FR EBU 3.75 guide catheter was inserted. Lesion crossed with guidewire using a WIRE ASAHI PROWATER 180CM. Pre-stent angioplasty was performed using a BALLOON SAPPHIRE 2.5X20. A drug-eluting stent was successfully placed using a SYNERGY XD 2.75X38. Stent strut is well apposed. Post-stent angioplasty was performed using a BALLOON SAPPHIRE  3.25X15. Pre stent IVUS after athetectomy showed one focal area of concentric calcium that was treated with the 2.5 balloon.   THere was  difficulty delivering the stent requiring buddy wires.Post stent IVUS showed the stent was well apposed, but there was significant underlying calcium that affected the shape of the stent. Post-Intervention Lesion Assessment The intervention was successful. Pre-interventional TIMI flow is 3. Post-intervention TIMI flow is 3. No complications occurred at this lesion. Ultrasound (IVUS) was performed on the lesion post PCI. Stent well apposed. There is a 0% residual stenosis post intervention.  2nd Diag lesion Angioplasty CATH LAUNCHER 6FR EBU 3.75 guide catheter was inserted. WIRE HI TORQ BMW 190CM guidewire used to cross lesion. Balloon angioplasty was performed using a BALLOON  SAPPHIRE 2.0X12. Maximum pressure: 8 atm. Post-Intervention Lesion Assessment The intervention was successful. Pre-interventional TIMI flow is 3. Post-intervention TIMI flow is 3. No complications occurred at this lesion. There is a 25% residual stenosis post intervention.   CARDIAC CATHETERIZATION  CARDIAC CATHETERIZATION 08/15/2019  Narrative  Dist RCA lesion is 50% stenosed.  Ost Cx to Prox Cx lesion is 50% stenosed.  Mid LAD lesion is 90% stenosed. Balloon angioplasty was performed using a BALLOON SAPPHIRE 2.0X15.  Post intervention, there is a 70% residual stenosis. Rigid lesion which will require atherectomy. No visible dissection but since the lesion was modified, will delay atherectomy.  2nd Diag lesion is 80% stenosed.  RPDA lesion is 75% stenosed. Scoring balloon angioplasty was performed using a BALLOON WOLVERINE 2.75X10.  Post intervention, there is a 0% residual stenosis.  The left ventricular systolic function is normal.  LV end diastolic pressure is normal.  The left ventricular ejection fraction is 55-65% by visual estimate.  There is mild aortic valve stenosis.  Scoring balloon angioplasty was performed using a BALLOON WOLVERINE 2.75X10.  Balloon angioplasty was performed using a  BALLOON SAPPHIRE 2.0X15.  Need to bring patient back for elective PCI of the LAD with atherectomy, in 2-3 weeks.  Findings Coronary Findings Diagnostic  Dominance: Right  Left Anterior Descending Mid LAD lesion is 90% stenosed. IVUS would not cross  Second Diagonal Branch 2nd Diag lesion is 80% stenosed.  Left Circumflex Ost Cx to Prox Cx lesion is 50% stenosed.  Right Coronary Artery Dist RCA lesion is 50% stenosed. Ultrasound (IVUS) was performed. Minimum lumen diameter: 3.7 mm. Moderate plaque burden was detected. IVUS has determined that the lesion is calcified.  Right Posterior Descending Artery RPDA lesion is 75% stenosed. The lesion was previously treated. Ultrasound (IVUS) was performed. Severe plaque burden was detected. stent appeared underexpanded.  Vessel appeared to be 3.0 mm in diameter.  Intervention  Mid LAD lesion Angioplasty CATH LAUNCHER 6FR EBU3.5 guide catheter was inserted. WIRE HI TORQ BMW 190CM guidewire used to cross lesion. Balloon angioplasty was performed using a BALLOON SAPPHIRE 2.0X15. [place] to protect diagonal Post-Intervention Lesion Assessment The intervention was unsuccessful due to lesion rigidity. Pre-interventional TIMI flow is 3. Post-intervention TIMI flow is 3. No complications occurred at this lesion. IVUS still would not cross after PTCA. There is a 70% residual stenosis post intervention.  RPDA lesion Angioplasty CATH VISTA GUIDE 6FR JR4 guide catheter was inserted. WIRE ASAHI PROWATER 180CM guidewire used to cross lesion. Scoring balloon angioplasty was performed using a BALLOON WOLVERINE 2.75X10. Post IVUS images show a well apposed stent Post-Intervention Lesion Assessment The intervention was successful. Pre-interventional TIMI flow is 3. Post-intervention TIMI flow is 3. No complications occurred at this lesion. There is a 0% residual stenosis post intervention.   STRESS TESTS  MYOCARDIAL PERFUSION IMAGING  12/07/2018  Narrative  The left ventricular ejection fraction is mildly decreased (45-54%).  Nuclear stress EF: 51%.  There was no ST segment deviation noted during stress.  The study is normal.  This is a low risk study.   ECHOCARDIOGRAM  ECHOCARDIOGRAM COMPLETE 04/22/2022  Narrative ECHOCARDIOGRAM REPORT    Patient Name:   Craig Lawson Date of Exam: 04/22/2022 Medical Rec #:  [id number]     Height:       69.0 in Accession #:    [id number]    Weight:       265.0 lb Date of Birth:  1946-04-12      BSA:  2.328 m Patient Age:    77 years      BP:           134/70 mmHg Patient Gender: M             HR:           57 bpm. Exam Location:  [place]  Procedure: 2D Echo, Cardiac Doppler, Color Doppler and Strain Analysis  Indications:    Nonrheumatic aortic valve stenosis [I35.0 (ICD-10-CM)]  History:        Patient has prior history of Echocardiogram examinations, most recent 05/20/2021. CAD, Aortic Valve Disease, Arrythmias:Bradycardia and LBBB, Signs/Symptoms:o; Risk Factors:Hypertension and Dyslipidemia.  Sonographer:    Luane School RDCS Referring Phys: [id number] [place]   Technically difficult study due to poor echo windows. IMPRESSIONS   1. Left ventricular ejection fraction, by estimation, is 50 to 55%. The left ventricle has low normal function. Left ventricular endocardial border not optimally defined to evaluate regional wall motion. There is moderate concentric left ventricular hypertrophy. Left ventricular diastolic parameters are consistent with Grade I diastolic dysfunction (impaired relaxation). 2. Right ventricular systolic function is normal. The right ventricular size is normal. Tricuspid regurgitation signal is inadequate for assessing PA pressure. 3. The mitral valve is normal in structure. No evidence of mitral valve regurgitation. No evidence of mitral stenosis. 4. Reduced SVI . The aortic valve is tricuspid. There is mild calcification of the  aortic valve. There is mild thickening of the aortic valve. Aortic valve regurgitation is not visualized. Moderate to severe aortic valve stenosis. 5. There is mild dilatation of the ascending aorta, measuring 39 mm. 6. The inferior vena cava is normal in size with greater than 50% respiratory variability, suggesting right atrial pressure of 3 mmHg.  FINDINGS Left Ventricle: Left ventricular ejection fraction, by estimation, is 50 to 55%. The left ventricle has low normal function. Left ventricular endocardial border not optimally defined to evaluate regional wall motion. Global longitudinal strain performed but not reported based on interpreter judgement due to suboptimal tracking. The left ventricular internal cavity size was normal in size. There is moderate concentric left ventricular hypertrophy. Left ventricular diastolic parameters are consistent with Grade I diastolic dysfunction (impaired relaxation). Indeterminate filling pressures.  Right Ventricle: The right ventricular size is normal. No increase in right ventricular wall thickness. Right ventricular systolic function is normal. Tricuspid regurgitation signal is inadequate for assessing PA pressure.  Left Atrium: Left atrial size was normal in size.  Right Atrium: Right atrial size was normal in size.  Pericardium: There is no evidence of pericardial effusion.  Mitral Valve: The mitral valve is normal in structure. No evidence of mitral valve regurgitation. No evidence of mitral valve stenosis.  Tricuspid Valve: The tricuspid valve is normal in structure. Tricuspid valve regurgitation is not demonstrated. No evidence of tricuspid stenosis.  Aortic Valve: Reduced SVI. The aortic valve is tricuspid. There is mild calcification of the aortic valve. There is mild thickening of the aortic valve. Aortic valve regurgitation is not visualized. Moderate to severe aortic stenosis is present. Aortic valve mean gradient measures 32.0 mmHg.  Aortic valve peak gradient measures 49.3 mmHg. Aortic valve area, by VTI measures 0.84 cm.  Pulmonic Valve: The pulmonic valve was normal in structure. Pulmonic valve regurgitation is not visualized. No evidence of pulmonic stenosis.  Aorta: The aortic root is normal in size and structure and the aortic arch was not well visualized. There is mild dilatation of the ascending aorta, measuring 39 mm.  Venous: The pulmonary veins were not well visualized. The inferior vena cava was not well visualized. The inferior vena cava is normal in size with greater than 50% respiratory variability, suggesting right atrial pressure of 3 mmHg.  IAS/Shunts: No atrial level shunt detected by color flow Doppler.   LEFT VENTRICLE PLAX 2D LVIDd:         5.60 cm     Diastology LVIDs:         4.10 cm     LV e' medial:    3.97 cm/s LV PW:         1.50 cm     LV E/e' medial:  23.6 LV IVS:        1.50 cm     LV e' lateral:   5.11 cm/s LVOT diam:     2.10 cm     LV E/e' lateral: 18.4 LV SV:         75 LV SV Index:   32 LVOT Area:     3.46 cm  LV Volumes (MOD) LV vol d, MOD A2C: 76.1 ml LV vol d, MOD A4C: 99.5 ml LV vol s, MOD A2C: 36.7 ml LV vol s, MOD A4C: 52.4 ml LV SV MOD A2C:     39.4 ml LV SV MOD A4C:     99.5 ml LV SV MOD BP:      45.4 ml  RIGHT VENTRICLE RV S prime:     11.30 cm/s TAPSE (M-mode): 2.7 cm  LEFT ATRIUM           Index        RIGHT ATRIUM           Index LA diam:      5.00 cm 2.15 cm/m   RA Area:     18.20 cm LA Vol (A2C): 85.4 ml 36.68 ml/m  RA Volume:   50.00 ml  21.47 ml/m LA Vol (A4C): 65.1 ml 27.96 ml/m AORTIC VALVE AV Area (Vmax):    0.91 cm AV Area (Vmean):   0.82 cm AV Area (VTI):     0.84 cm AV Vmax:           351.00 cm/s AV Vmean:          266.000 cm/s AV VTI:            0.888 m AV Peak Grad:      49.3 mmHg AV Mean Grad:      32.0 mmHg LVOT Vmax:         92.00 cm/s LVOT Vmean:        62.900 cm/s LVOT VTI:          0.216 m LVOT/AV VTI ratio:  0.24  AORTA Ao Root diam: 3.20 cm Ao Asc diam:  3.90 cm  MITRAL VALVE               TRICUSPID VALVE MV Area (PHT): 2.38 cm    TR Peak grad:   6.2 mmHg MV Decel Time: 319 msec    TR Vmax:        124.00 cm/s MV E velocity: 93.80 cm/s MV A velocity: 94.30 cm/s  SHUNTS MV E/A ratio:  0.99        Systemic VTI:  0.22 m Systemic Diam: 2.10 cm  Shirlee More MD Electronically signed by Shirlee More MD Signature Date/Time: 04/22/2022/12:52:15 PM    Final    MONITORS  LONG TERM MONITOR (3-14 DAYS) 10/25/2018  Narrative A ZIO monitor was applied for  3 days to assess palpitation following MI beginning 10/12/2018.  Rhythm throughout was sinus with bundle branch block minimum average and maximum heart rates of 41, 55 and 125 bpm.  There were no pauses of 3 seconds or greater and no episodes of sinus node or AV nodal block.  There were 3 triggered and 3 diary events predominantly associated with PVCs.  Ventricular ectopy was rare with isolated PVCs.  Supraventricular ectopy was rare there were no episodes of atrial fibrillation or flutter.  There were 3 brief episodes of runs of atrial premature contractions the longest 6 complexes at a rate of 113 bpm.   Conclusion symptomatic PVCs, however PVC burden is low and the occurrence of palpitation is infrequent            Patient Profile     77 y.o. male male Jehovah's witness (blood product refusal) with CAD (DES to PDA and DES to OM2 at [place], PTCA of ISR of PDA and attempted unusccessful angioplasty to LAD in 07/2019 with staged intervention with PTCA to D2, PTCA/DES/orbital atherectomy to LAD), moderate-severe AS by echo 04/2022, chronic edema, mild dilation of ascending aorta, LBBB, HTN, HLD (declines statins due to joint pain), morbid obesity with OSA intolerant of CPAP, possible prior TIAs (improved after initiation of Plavix per pt), habitual ETOH use (max ~2 glasses of wine nightly) who is being seen 07/03/2022 for the evaluation of  progressive angina   Assessment & Plan    Crescendo angina, CAD, suspected NSTEMI, Jehovah's Witness-no blood products - also likely driven by moderate to severe degree of aortic stenosis as well - troponin is now down - continue ASA, Plavix - heparin per pharmacy given elevation in troponin - baseline bradycardia precludes addition of BB - see below re: lipid plan - R/LHC, eval AS as well, added to add-on board, orders written    Moderate-severe aortic stenosis - plan for [facility] as above, on add on board - if severe, will need structural eval. Knows about TAVR   Borderline dilation of ascending aorta by echo 04/2022 - (13mm)   HTN - will manage in context above - resume ARB - pt unclear which one   Hyperlipidemia - historical poor tolerance of statins due to joint issues, maintained on Zetia and pravastatin 2x/week (sometimes less), declined bempedoic acid - LDL 108 - consider lipid clinic eval at follow-up   LBBB -  known for pt   H/o edema, ? Possible chronic HFpEF - hold HCTZ pending cath with resumption plan TBD post discharge - reports no longer on Lasix   OSA - intolerant of CPAP   Habitual ETOH use - equivalent of approx 2 drinks of wine per night per patient - follow for withdrawal - discussed limiting use given bleeding risk with DAPT and refusal of blood products  For questions or updates, please contact [place] Please consult [email or web address] for contact info under        Signed, Candee Furbish, MD  07/04/2022, 8:49 AM

## 2022-07-04 NOTE — Progress Notes (Signed)
[place] for heparin  Indication: chest pain/ACS  Allergies  Allergen Reactions   Penicillins     Had rash in his 30s-40s with this as well as recurrence when re-trialed   Statins Other (See Comments)    Muscle pains; increasing with continued use.   Muscle pains; increasing with continued use.    Isosorbide Other (See Comments)    "makes me dizzy and I have headaches"   Latex Rash    Has noticed with bandages in past, leaves a sore on skin but more recently with APAP mask     Patient Measurements: Height: 5\' 9"  (175.3 cm) Weight: 117.9 kg (260 lb) IBW/kg (Calculated) : 70.7 Heparin Dosing Weight: 97.2kg   Vital Signs: Temp: 97.7 F (36.5 C) (03/15 0825) Temp Source: Oral (03/15 0825) BP: 133/83 (03/15 1844) Pulse Rate: 60 (03/15 1844)  Labs: Recent Labs    07/03/22 1529 07/03/22 1826 07/04/22 0258 07/04/22 0620 07/04/22 1130 07/04/22 1727 07/04/22 1729  HGB 17.0  --  15.9  --   --  13.9 15.0  HCT 47.0  --  46.0  --   --  41.0 44.0  PLT 150  --  136*  --   --   --   --   HEPARINUNFRC  --   --  <0.10*  --  0.32  --   --   CREATININE 1.08  --  1.06  --   --   --   --   TROPONINIHS 49* 114*  --  86*  --   --   --      Estimated Creatinine Clearance: 72.8 mL/min (by C-G formula based on SCr of 1.06 mg/dL).   Assessment: 85 YOM admitted with CC of SOB and CP on exertion and started on IV heparin.  Now s/p cath and Pharmacy to resume IV heparin 8 hours post sheath removal while awaiting evaluation for CABG and AVR.  Sheath removed at 1811.  No bleeding nor hematoma documented.  Goal of Therapy:  Heparin level 0.3-0.7 units/ml Monitor platelets by anticoagulation protocol: Yes   Plan:  On 3/16 at 0230, resume heparin gtt at 1550 units/hr - no bolus post cath Check 8 hr heparin level  Daily heparin level and CBC F/u CVTS plans  Elora Wolter D. Mina Marble, PharmD, BCPS, [place] 07/04/2022, 7:07 PM

## 2022-07-04 NOTE — Progress Notes (Signed)
Patient returned from cardiac cath, right radial site with TR band in place.  Level zero.  Reviewed post cath instructions.

## 2022-07-04 NOTE — Progress Notes (Signed)
ANTICOAGULATION CONSULT NOTE - Follow Up Consult  Pharmacy Consult for heparin Indication: chest pain/ACS  Labs: Recent Labs    07/03/22 1529 07/03/22 1826 07/04/22 0258  HGB 17.0  --  15.9  HCT 47.0  --  46.0  PLT 150  --  136*  HEPARINUNFRC  --   --  <0.10*  CREATININE 1.08  --   --   TROPONINIHS 49* 114*  --     Assessment: 77yo male subtherapeutic on heparin with initial dosing for CP; no infusion issues or signs of bleeding per RN.  Goal of Therapy:  Heparin level 0.3-0.7 units/ml   Plan:  Will rebolus with heparin 3000 units and increase heparin infusion by 4 units/kgABW/hr to 1500 units/hr and check level in 8 hours.    Wynona Neat, PharmD, BCPS  07/04/2022,3:52 AM

## 2022-07-04 NOTE — ED Notes (Signed)
ED TO INPATIENT HANDOFF REPORT  ED Nurse Name and Phone #: Effie Shy Name/Age/Gender Craig Lawson 77 y.o. male Room/Bed: 045C/045C  Code Status   Code Status: Full Code  Home/SNF/Other Home Patient oriented to: self, place, time, and situation Is this baseline? Yes   Triage Complete: Triage complete  Chief Complaint NSTEMI (non-ST elevated myocardial infarction) ([place]) [I21.4]  Triage Note Pt came in via POV d/t CP & SOB with exertion the past couple months. Does have extensive cardiac Hx & was told that his conditioning was worsening. A/Ox4, denies pain while in triage. State that when it happens he has to sit down d/t the difficulty breathing & the burning CP, denies any radiation when CP occurs.     Allergies Allergies  Allergen Reactions   Penicillins     Had rash in his 30s-40s with this as well as recurrence when re-trialed   Statins Other (See Comments)    Muscle pains; increasing with continued use.   Muscle pains; increasing with continued use.    Isosorbide Other (See Comments)    "makes me dizzy and I have headaches"   Latex Rash    Has noticed with bandages in past, leaves a sore on skin but more recently with APAP mask     Level of Care/Admitting Diagnosis ED Disposition     ED Disposition  Admit   Condition  --   [place]: [place] [[id number]]  Level of Care: Telemetry Cardiac [[id number]]  May admit patient to [facility] or [facility] if equivalent level of care is available:: No  Covid Evaluation: Asymptomatic - no recent exposure (last 10 days) testing not required  Diagnosis: NSTEMI (non-ST elevated myocardial infarction) [id number]  Admitting Physician: [place], [place]  Attending Physician: Amidon, MARK C [id number]  Certification:: I certify this patient will need inpatient services for at least 2 midnights  Estimated Length of Stay: 3          B Medical/Surgery History Past Medical History:   Diagnosis Date   Aortic stenosis 08/10/2015   Bilateral leg edema 08/22/2015   Bradycardia 06/01/2017   Cellulitis of left lower extremity 04/29/2017   Coronary artery disease involving native coronary artery 10/22/2017   DOE (dyspnea on exertion) 05/04/2017   Essential hypertension 08/13/2015   Heart murmur 04/29/2017   Kissing, osteophytes 08/22/2015   LBBB (left bundle branch block) 08/22/2015   Lipid disorder 11/30/2017   Lumbar facet joint pain 08/22/2015   Medicare annual wellness visit, subsequent 06/02/2016   Morbid obesity ([place]) 10/22/2017   Murmur 04/29/2017   Myopathy 09/02/2019   Formatting of this note might be different from the original. Secondary to high freq statin use.   Need for vaccination for Strep pneumoniae 03/27/2017   Non-ST elevation myocardial infarction (NSTEMI), subendocardial infarction, subsequent episode of care ([place]) 11/30/2017   Obesity, Class II, BMI 35-39.9 08/10/2015   OSA on CPAP 12/02/2017   Pure hypercholesterolemia 05/24/2015   Seborrheic keratoses 03/27/2017   Overview:  Of the right forehead.   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   Venous insufficiency 08/10/2015   Past Surgical History:  Procedure Laterality Date   CARDIAC CATHETERIZATION     CHOLECYSTECTOMY     CORONARY ANGIOPLASTY     CORONARY ATHERECTOMY N/A 08/16/2019   Procedure: CORONARY ATHERECTOMY;  Surgeon: Jettie Booze, MD;  Location: [facility]  INVASIVE CV LAB;  Service: Cardiovascular;  Laterality: N/A;   CORONARY BALLOON ANGIOPLASTY N/A 07/28/2019   Procedure: CORONARY BALLOON ANGIOPLASTY;  Surgeon: Jettie Booze, MD;  Location: [place] CV LAB;  Service: Cardiovascular;  Laterality: N/A;   ESOPHAGOGASTRODUODENOSCOPY     INTRAVASCULAR ULTRASOUND/IVUS N/A 07/28/2019   Procedure: Intravascular Ultrasound/IVUS;  Surgeon: Jettie Booze, MD;  Location:  [place] CV LAB;  Service: Cardiovascular;  Laterality: N/A;   INTRAVASCULAR ULTRASOUND/IVUS N/A 08/16/2019   Procedure: Intravascular Ultrasound/IVUS;  Surgeon: Jettie Booze, MD;  Location: [place] CV LAB;  Service: Cardiovascular;  Laterality: N/A;   LEFT HEART CATH AND CORONARY ANGIOGRAPHY N/A 07/28/2019   Procedure: LEFT HEART CATH AND CORONARY ANGIOGRAPHY;  Surgeon: Jettie Booze, MD;  Location: [place] CV LAB;  Service: Cardiovascular;  Laterality: N/A;   LEFT HEART CATH AND CORONARY ANGIOGRAPHY N/A 08/16/2019   Procedure: LEFT HEART CATH AND CORONARY ANGIOGRAPHY;  Surgeon: Jettie Booze, MD;  Location: [place] CV LAB;  Service: Cardiovascular;  Laterality: N/A;     A IV Location/Drains/Wounds Patient Lines/Drains/Airways Status     Active Line/Drains/Airways     Name Placement date Placement time Site Days   Peripheral IV 07/03/22 18 G 1" Left;Posterior Hand 07/03/22  2109  Hand  1   Peripheral IV 07/03/22 20 G 1" Anterior;Right Forearm 07/03/22  2110  Forearm  1            Intake/Output Last 24 hours  Intake/Output Summary (Last 24 hours) at 07/04/2022 1256 Last data filed at 07/04/2022 0517 Gross per 24 hour  Intake 353.7 ml  Output --  Net 353.7 ml    Labs/Imaging Results for orders placed or performed during the hospital encounter of 07/03/22 (from the past 48 hour(s))  Basic metabolic panel     Status: None   Collection Time: 07/03/22  3:29 PM  Result Value Ref Range   Sodium 140 135 - 145 mmol/L   Potassium 4.4 3.5 - 5.1 mmol/L   Chloride 101 98 - 111 mmol/L   CO2 27 22 - 32 mmol/L   Glucose, Bld 99 70 - 99 mg/dL    Comment: Glucose reference range applies only to samples taken after fasting for at least 8 hours.   BUN 22 8 - 23 mg/dL   Creatinine, Ser 1.08 0.61 - 1.24 mg/dL   Calcium 9.6 8.9 - 10.3 mg/dL   GFR, Estimated >60 >60 mL/min    Comment: (NOTE) Calculated using the CKD-EPI Creatinine Equation (2021)    Anion  gap 12 5 - 15    Comment: Performed at [facility] [address] [place], [place] [address]  CBC     Status: Abnormal   Collection Time: 07/03/22  3:29 PM  Result Value Ref Range   WBC 6.0 4.0 - 10.5 K/uL   RBC 5.32 4.22 - 5.81 MIL/uL   Hemoglobin 17.0 13.0 - 17.0 g/dL   HCT 47.0 39.0 - 52.0 %   MCV 88.3 80.0 - 100.0 fL   MCH 32.0 26.0 - 34.0 pg   MCHC 36.2 (H) 30.0 - 36.0 g/dL   RDW 13.3 11.5 - 15.5 %   Platelets 150 150 - 400 K/uL   nRBC 0.0 0.0 - 0.2 %    Comment: Performed at [facility], [place] [address], [place], [place] [address]  Troponin I (High Sensitivity)     Status: Abnormal   Collection Time: 07/03/22  3:29 PM  Result Value  Ref Range   Troponin I (High Sensitivity) 49 (H) <18 ng/L    Comment: (NOTE) Elevated high sensitivity troponin I (hsTnI) values and significant  changes across serial measurements may suggest ACS but many other  chronic and acute conditions are known to elevate hsTnI results.  Refer to the "Links" section for chest pain algorithms and additional  guidance. Performed at [facility], [place] [address], [place], [place] [address]   Brain natriuretic peptide     Status: Abnormal   Collection Time: 07/03/22  3:29 PM  Result Value Ref Range   B Natriuretic Peptide 141.9 (H) 0.0 - 100.0 pg/mL    Comment: Performed at [facility] [address] [place], [place] [address]  Troponin I (High Sensitivity)     Status: Abnormal   Collection Time: 07/03/22  6:26 PM  Result Value Ref Range   Troponin I (High Sensitivity) 114 (HH) <18 ng/L    Comment: CRITICAL RESULT CALLED TO, READ BACK BY AND VERIFIED WITH C CARTER RN 07/03/2022 2028 B NUNNERY (NOTE) Elevated high sensitivity troponin I (hsTnI) values and significant  changes across serial measurements may suggest ACS but many other  chronic and acute conditions are known to elevate hsTnI results.  Refer to the "Links" section for chest pain algorithms and additional   guidance. Performed at [facility], [place] [address] [place], [place] [address]   Heparin level (unfractionated)     Status: Abnormal   Collection Time: 07/04/22  2:58 AM  Result Value Ref Range   Heparin Unfractionated <0.10 (L) 0.30 - 0.70 IU/mL    Comment: (NOTE) The clinical reportable range upper limit is being lowered to >1.10 to align with the FDA approved guidance for the current laboratory assay.  If heparin results are below expected values, and patient dosage has  been confirmed, suggest follow up testing of antithrombin III levels. Performed at [facility], [place] [address] [place], [place] [address]   CBC     Status: Abnormal   Collection Time: 07/04/22  2:58 AM  Result Value Ref Range   WBC 5.8 4.0 - 10.5 K/uL   RBC 5.17 4.22 - 5.81 MIL/uL   Hemoglobin 15.9 13.0 - 17.0 g/dL   HCT 46.0 39.0 - 52.0 %   MCV 89.0 80.0 - 100.0 fL   MCH 30.8 26.0 - 34.0 pg   MCHC 34.6 30.0 - 36.0 g/dL   RDW 13.3 11.5 - 15.5 %   Platelets 136 (L) 150 - 400 K/uL    Comment: REPEATED TO VERIFY   nRBC 0.0 0.0 - 0.2 %    Comment: Performed at [facility], [place] [address] [place], [place] [address]  Hepatic function panel     Status: Abnormal   Collection Time: 07/04/22  2:58 AM  Result Value Ref Range   Total Protein 6.0 (L) 6.5 - 8.1 g/dL   Albumin 3.6 3.5 - 5.0 g/dL   AST 26 15 - 41 U/L   ALT 37 0 - 44 U/L   Alkaline Phosphatase 21 (L) 38 - 126 U/L   Total Bilirubin 1.2 0.3 - 1.2 mg/dL   Bilirubin, Direct 0.1 0.0 - 0.2 mg/dL   Indirect Bilirubin 1.1 (H) 0.3 - 0.9 mg/dL    Comment: Performed at [facility] [address], [place], [place] [id number]  Basic metabolic panel     Status: None   Collection Time: 07/04/22  2:58 AM  Result Value  Ref Range   Sodium 138 135 - 145 mmol/L   Potassium 3.6 3.5 - 5.1 mmol/L   Chloride 102 98 - 111 mmol/L   CO2 24 22 - 32 mmol/L   Glucose, Bld 96 70 - 99 mg/dL    Comment: Glucose reference range applies only to  samples taken after fasting for at least 8 hours.   BUN 20 8 - 23 mg/dL   Creatinine, Ser 1.06 0.61 - 1.24 mg/dL   Calcium 9.1 8.9 - 10.3 mg/dL   GFR, Estimated >60 >60 mL/min    Comment: (NOTE) Calculated using the CKD-EPI Creatinine Equation (2021)    Anion gap 12 5 - 15    Comment: Performed at [facility] [address], [place], [place] [address]  Lipid panel     Status: Abnormal   Collection Time: 07/04/22  2:58 AM  Result Value Ref Range   Cholesterol 184 0 - 200 mg/dL   Triglycerides 196 (H) <150 mg/dL   HDL 37 (L) >40 mg/dL   Total CHOL/HDL Ratio 5.0 RATIO   VLDL 39 0 - 40 mg/dL   LDL Cholesterol 108 (H) 0 - 99 mg/dL    Comment:        Total Cholesterol/HDL:CHD Risk Coronary Heart Disease Risk Table                     Men   Women  1/2 Average Risk   3.4   3.3  Average Risk       5.0   4.4  2 X Average Risk   9.6   7.1  3 X Average Risk  23.4   11.0        Use the calculated Patient Ratio above and the CHD Risk Table to determine the patient's CHD Risk.        ATP III CLASSIFICATION (LDL):  <100     mg/dL   Optimal  100-129  mg/dL   Near or Above                    Optimal  130-159  mg/dL   Borderline  160-189  mg/dL   High  >190     mg/dL   Very High Performed at [facility] [address], [place], [place] [address]   TSH     Status: Abnormal   Collection Time: 07/04/22  2:58 AM  Result Value Ref Range   TSH 4.966 (H) 0.350 - 4.500 uIU/mL    Comment: Performed by a 3rd Generation assay with a functional sensitivity of <=0.01 uIU/mL. Performed at [facility], [place] [address] [place], [place] [address]   Troponin I (High Sensitivity)     Status: Abnormal   Collection Time: 07/04/22  6:20 AM  Result Value Ref Range   Troponin I (High Sensitivity) 86 (H) <18 ng/L    Comment: DELTA CHECK NOTED (NOTE) Elevated high sensitivity troponin I (hsTnI) values and significant  changes across serial measurements may suggest ACS but many other   chronic and acute conditions are known to elevate hsTnI results.  Refer to the "Links" section for chest pain algorithms and additional  guidance. Performed at [facility], [place] [address], [place], [place] [address]   Heparin level (unfractionated)     Status: None   Collection Time: 07/04/22 11:30 AM  Result Value Ref Range   Heparin Unfractionated 0.32 0.30 - 0.70 IU/mL    Comment: (  NOTE) The clinical reportable range upper limit is being lowered to >1.10 to align with the FDA approved guidance for the current laboratory assay.  If heparin results are below expected values, and patient dosage has  been confirmed, suggest follow up testing of antithrombin III levels. Performed at [facility], [place] [address] [place], [place] [address]    DG Chest 2 View  Result Date: 07/03/2022 CLINICAL DATA:  Provided history: Chest pain. EXAM: CHEST - 2 VIEW COMPARISON:  Prior chest radiographs 11/25/2021 and earlier. FINDINGS: Stable cardiac and mediastinal contours as compared to the prior chest radiographs of 11/25/2021. Aortic atherosclerosis. Chronic elevation of the right hemidiaphragm. No appreciable airspace consolidation or pulmonary edema. No evidence of pleural effusion or pneumothorax. No acute osseous abnormality identified. Surgical clips within the upper abdomen. IMPRESSION: No evidence of acute cardiopulmonary abnormality. Aortic Atherosclerosis (ICD10-I70.0). Electronically Signed   By: Kellie Simmering D.O.   On: 07/03/2022 16:10    Pending Labs Unresulted Labs (From admission, onward)     Start     Ordered   07/04/22 0500  Heparin level (unfractionated)  Daily,   R      07/03/22 2047   07/04/22 0500  CBC  Daily,   R      07/03/22 2047   07/04/22 0500  Lipoprotein A (LPA)  Tomorrow morning,   R        07/03/22 2052            Vitals/Pain Today's Vitals   07/04/22 0825 07/04/22 0900 07/04/22 0940 07/04/22 1000  BP:  137/80  139/88  Pulse:  62  62  Resp:   (!) 23  (!) 22  Temp: 97.7 F (36.5 C)     TempSrc: Oral     SpO2:  92%  91%  Weight:      Height:      PainSc:   0-No pain     Isolation Precautions No active isolations  Medications Medications  heparin ADULT infusion 100 units/mL (25000 units/235mL) (1,500 Units/hr Intravenous Rate/Dose Verify 07/04/22 0746)  aspirin EC tablet 81 mg (81 mg Oral Given 07/03/22 2125)  ezetimibe (ZETIA) tablet 10 mg (10 mg Oral Given 07/03/22 2125)  pravastatin (PRAVACHOL) tablet 20 mg (20 mg Oral Given 07/04/22 0939)  clopidogrel (PLAVIX) tablet 75 mg (75 mg Oral Given 07/03/22 2125)  acetaminophen (TYLENOL) tablet 650 mg (has no administration in time range)  ondansetron (ZOFRAN) injection 4 mg (has no administration in time range)  sodium chloride flush (NS) 0.9 % injection 3 mL (3 mLs Intravenous Given 07/04/22 1011)  sodium chloride flush (NS) 0.9 % injection 3 mL (has no administration in time range)  0.9 %  sodium chloride infusion (has no administration in time range)  sodium chloride flush (NS) 0.9 % injection 3 mL (3 mLs Intravenous Given 07/04/22 1010)  sodium chloride flush (NS) 0.9 % injection 3 mL (has no administration in time range)  0.9 %  sodium chloride infusion (has no administration in time range)  0.9% sodium chloride infusion (0 mL/kg/hr  117.9 kg Intravenous Stopped 07/04/22 0517)    Followed by  0.9% sodium chloride infusion (1 mL/kg/hr  117.9 kg Intravenous Rate/Dose Verify 07/04/22 0746)  heparin bolus via infusion 4,000 Units (4,000 Units Intravenous Bolus from Bag 07/03/22 2123)  aspirin chewable tablet 81 mg (81 mg Oral Given 07/04/22 0523)  heparin bolus via infusion 3,000 Units (3,000 Units Intravenous Bolus from Bag 07/04/22 0416)    Mobility walks  Focused Assessments   R Recommendations: See Admitting Provider Note  Report given to:   Additional Notes: on heparin infusion. Will go to cath lab today but I have not received update on what time

## 2022-07-04 NOTE — Progress Notes (Signed)
Interventional Cardiology Note:   See cath report.  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 Severe aortic stenosis. He will need a CT surgery consult for CABG and AVR. He has been on Plavix so I will begin holding that tonight. Repeat echo tomorrow. 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 He will also need Plavix washout before surgery. Resume IV heparin 8 hours post sheath pull.  Due to the busy cath lab schedule today, his case was finished after 6:30 pm. CT surgery will need to be called tomorrow for consultation.   Lauree Chandler, MD, [facility] 07/04/2022 6:43 PM

## 2022-07-05 ENCOUNTER — Inpatient Hospital Stay (HOSPITAL_COMMUNITY): Payer: Medicare Other

## 2022-07-05 DIAGNOSIS — I509 Heart failure, unspecified: Secondary | ICD-10-CM | POA: Diagnosis not present

## 2022-07-05 DIAGNOSIS — I214 Non-ST elevation (NSTEMI) myocardial infarction: Secondary | ICD-10-CM | POA: Diagnosis not present

## 2022-07-05 DIAGNOSIS — I2510 Atherosclerotic heart disease of native coronary artery without angina pectoris: Secondary | ICD-10-CM

## 2022-07-05 DIAGNOSIS — I350 Nonrheumatic aortic (valve) stenosis: Secondary | ICD-10-CM | POA: Diagnosis not present

## 2022-07-05 LAB — CBC
HCT: 43.3 % (ref 39.0–52.0)
Hemoglobin: 15.5 g/dL (ref 13.0–17.0)
MCH: 31.4 pg (ref 26.0–34.0)
MCHC: 35.8 g/dL (ref 30.0–36.0)
MCV: 87.7 fL (ref 80.0–100.0)
Platelets: 141 10*3/uL — ABNORMAL LOW (ref 150–400)
RBC: 4.94 MIL/uL (ref 4.22–5.81)
RDW: 13.4 % (ref 11.5–15.5)
WBC: 6.6 10*3/uL (ref 4.0–10.5)
nRBC: 0 % (ref 0.0–0.2)

## 2022-07-05 LAB — BASIC METABOLIC PANEL
Anion gap: 7 (ref 5–15)
BUN: 17 mg/dL (ref 8–23)
CO2: 27 mmol/L (ref 22–32)
Calcium: 8.6 mg/dL — ABNORMAL LOW (ref 8.9–10.3)
Chloride: 104 mmol/L (ref 98–111)
Creatinine, Ser: 1.06 mg/dL (ref 0.61–1.24)
GFR, Estimated: >60 mL/min (ref >60)
Glucose, Bld: 102 mg/dL — ABNORMAL HIGH (ref 70–99)
Potassium: 4.3 mmol/L (ref 3.5–5.1)
Sodium: 138 mmol/L (ref 135–145)

## 2022-07-05 LAB — ECHOCARDIOGRAM COMPLETE
AV Mean grad: 28 mmHg
AV Peak grad: 52.7 mmHg
Ao pk vel: 3.63 m/s
Area-P 1/2: 2.16 cm2
Height: 69 in
S' Lateral: 4.6 cm
Weight: 4128 oz

## 2022-07-05 LAB — HEPARIN LEVEL (UNFRACTIONATED): Heparin Unfractionated: 0.41 IU/mL (ref 0.30–0.70)

## 2022-07-05 LAB — LIPOPROTEIN A (LPA): Lipoprotein (a): 63.1 nmol/L — ABNORMAL HIGH (ref <75.0)

## 2022-07-05 MED ORDER — PNEUMOCOCCAL 20-VAL CONJ VACC 0.5 ML IM SUSY
0.5000 mL | PREFILLED_SYRINGE | INTRAMUSCULAR | Status: DC
  Filled 2022-07-05: qty 0.5

## 2022-07-05 NOTE — Progress Notes (Signed)
Rounding Note    Patient Name: Craig Lawson Date of Encounter: 07/05/2022  [place] Cardiologist: Shirlee More, MD   Subjective   Comfortable in bed, IV heparin.  No further chest pain.  Wife at bedside.  Inpatient Medications    Scheduled Meds:  aspirin EC  81 mg Oral QHS   ezetimibe  10 mg Oral QHS   furosemide  40 mg Intravenous Q12H   pravastatin  20 mg Oral Once per day on Thu Fri   sodium chloride flush  3 mL Intravenous Q12H   sodium chloride flush  3 mL Intravenous Q12H   sodium chloride flush  3 mL Intravenous Q12H   Continuous Infusions:  sodium chloride     sodium chloride     heparin 1,550 Units/hr (07/05/22 0338)   PRN Meds: sodium chloride, sodium chloride, acetaminophen, ondansetron (ZOFRAN) IV, sodium chloride flush, sodium chloride flush   Vital Signs    Vitals:   07/04/22 2310 07/04/22 2350 07/05/22 0309 07/05/22 0804  BP: (!) 143/66 106/68 133/73 128/79  Pulse: 60 (!) 55 62 (!) 58  Resp:  19 18 18   Temp:  98 F (36.7 C) 97.8 F (36.6 C) 98 F (36.7 C)  TempSrc:  Oral Oral Oral  SpO2: 95% 94% 94% 96%  Weight:   117 kg   Height:        Intake/Output Summary (Last 24 hours) at 07/05/2022 0943 Last data filed at 07/05/2022 0900 Gross per 24 hour  Intake 85.07 ml  Output 2120 ml  Net -2034.93 ml      07/05/2022    3:09 AM 07/03/2022    6:34 PM 11/11/2021    8:17 AM  Last 3 Weights  Weight (lbs) 258 lb 260 lb 265 lb  Weight (kg) 117.028 kg 117.935 kg 120.203 kg      Telemetry    Normal sinus rhythm/sinus bradycardia interventricular conduction delay- Personally Reviewed  ECG    Sinus rhythm at a [place]- Personally Reviewed  Physical Exam   GEN: No acute distress.   Neck: No JVD Cardiac: RRR, 3/6 systolic murmur, no rubs, or gallops.  Respiratory: Clear to auscultation bilaterally. GI: Soft, nontender, non-distended  MS: No edema; No deformity. Neuro:  Nonfocal  Psych: Normal affect   Labs    High  Sensitivity Troponin:   Recent Labs  Lab 07/03/22 1529 07/03/22 1826 07/04/22 0620  TROPONINIHS 49* 114* 86*     Chemistry Recent Labs  Lab 07/03/22 1529 07/04/22 0258 07/04/22 1727 07/04/22 1729 07/05/22 0129  NA 140 138 143 139 138  K 4.4 3.6 3.5 3.9 4.3  CL 101 102  --   --  104  CO2 27 24  --   --  27  GLUCOSE 99 96  --   --  102*  BUN 22 20  --   --  17  CREATININE 1.08 1.06  --   --  1.06  CALCIUM 9.6 9.1  --   --  8.6*  PROT  --  6.0*  --   --   --   ALBUMIN  --  3.6  --   --   --   AST  --  26  --   --   --   ALT  --  37  --   --   --   ALKPHOS  --  21*  --   --   --   BILITOT  --  1.2  --   --   --  GFRNONAA >60 >60  --   --  >60  ANIONGAP 12 12  --   --  7    Lipids  Recent Labs  Lab 07/04/22 0258  CHOL 184  TRIG 196*  HDL 37*  LDLCALC 108*  CHOLHDL 5.0    Hematology Recent Labs  Lab 07/03/22 1529 07/04/22 0258 07/04/22 1727 07/04/22 1729 07/05/22 0129  WBC 6.0 5.8  --   --  6.6  RBC 5.32 5.17  --   --  4.94  HGB 17.0 15.9 13.9 15.0 15.5  HCT 47.0 46.0 41.0 44.0 43.3  MCV 88.3 89.0  --   --  87.7  MCH 32.0 30.8  --   --  31.4  MCHC 36.2* 34.6  --   --  35.8  RDW 13.3 13.3  --   --  13.4  PLT 150 136*  --   --  141*   Thyroid  Recent Labs  Lab 07/04/22 0258  TSH 4.966*    BNP Recent Labs  Lab 07/03/22 1529  BNP 141.9*    DDimer No results for input(s): "DDIMER" in the last 168 hours.   Radiology    CARDIAC CATHETERIZATION  Result Date: 07/04/2022   Dist RCA lesion is 50% stenosed.   2nd Diag lesion is 25% stenosed.   1st Diag lesion is 75% stenosed.   RPDA lesion is 30% stenosed.   RPAV lesion is 60% stenosed.   Prox LAD to Mid LAD lesion is 20% stenosed.   Ost Cx to Prox Cx lesion is 90% stenosed.   Mid LM to Ost LAD lesion is 80% stenosed.   Ost LAD lesion is 80% stenosed.   Previously placed 1st Mrg stent of unknown type is  widely patent. Severe distal left main stenosis (IVUS minimum lumen area of 4.0 mm2) Severe ostial LAD  stenosis (IVUD minimum lumen area of 3.5 mm2). Patent mid LAD stent. Severe ostial Circumflex stenosis. Patent obtuse marginal stent Large dominant RCA with moderate distal stenosis, patent PDA stent, moderate posterolateral artery stenosis. Severe aortic stenosis by echo (Cath data: mean gradient 32.8 mmHg, peak to peak gradient 42 mmHg) Recommendations: Severe distal left main stenosis involving both the LAD and Circumflex, confirmed with IVUS imaging. Severe aortic stenosis. He will need a CT surgery consult for CABG and AVR. He has been on Plavix so I will begin holding that tonight. Repeat echo tomorrow. 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 He will also need Plavix washout before surgery. Resume IV heparin 8 hours post sheath pull. Will call CT surgery to see him tomorrow.   DG Chest 2 View  Result Date: 07/03/2022 CLINICAL DATA:  Provided history: Chest pain. EXAM: CHEST - 2 VIEW COMPARISON:  Prior chest radiographs 11/25/2021 and earlier. FINDINGS: Stable cardiac and mediastinal contours as compared to the prior chest radiographs of 11/25/2021. Aortic atherosclerosis. Chronic elevation of the right hemidiaphragm. No appreciable airspace consolidation or pulmonary edema. No evidence of pleural effusion or pneumothorax. No acute osseous abnormality identified. Surgical clips within the upper abdomen. IMPRESSION: No evidence of acute cardiopulmonary abnormality. Aortic Atherosclerosis (ICD10-I70.0). Electronically Signed   By: Kellie Simmering D.O.   On: 07/03/2022 16:10    Cardiac Studies   Cardiac Studies & Procedures   CARDIAC CATHETERIZATION  CARDIAC CATHETERIZATION 07/04/2022  Narrative   Dist RCA lesion is 50% stenosed.   2nd Diag lesion is 25% stenosed.   1st Diag lesion  is 75% stenosed.   RPDA lesion is 30% stenosed.   RPAV lesion is 60% stenosed.   Prox LAD to Mid LAD lesion is 20% stenosed.   Ost Cx to Prox Cx lesion is 90%  stenosed.   Mid LM to Ost LAD lesion is 80% stenosed.   Ost LAD lesion is 80% stenosed.   Previously placed 1st Mrg stent of unknown type is  widely patent.  Severe distal left main stenosis (IVUS minimum lumen area of 4.0 mm2) Severe ostial LAD stenosis (IVUD minimum lumen area of 3.5 mm2). Patent mid LAD stent. Severe ostial Circumflex stenosis. Patent obtuse marginal stent Large dominant RCA with moderate distal stenosis, patent PDA stent, moderate posterolateral artery stenosis. Severe aortic stenosis by echo (Cath data: mean gradient 32.8 mmHg, peak to peak gradient 42 mmHg)  Recommendations: Severe distal left main stenosis involving both the LAD and Circumflex, confirmed with IVUS imaging. Severe aortic stenosis. He will need a CT surgery consult for CABG and AVR. He has been on Plavix so I will begin holding that tonight. Repeat echo tomorrow. 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 He will also need Plavix washout before surgery. Resume IV heparin 8 hours post sheath pull. Will call CT surgery to see him tomorrow.  Findings Coronary Findings Diagnostic  Dominance: Right  Left Main Mid LM to Ost LAD lesion is 80% stenosed.  Left Anterior Descending Ost LAD lesion is 80% stenosed. Prox LAD to Mid LAD lesion is 20% stenosed. The lesion was previously treated using a drug eluting stent over 2 years ago.  First Diagonal Branch 1st Diag lesion is 75% stenosed.  Second Diagonal Branch 2nd Diag lesion is 25% stenosed. The lesion was previously treated .  Left Circumflex Ost Cx to Prox Cx lesion is 90% stenosed.  First Obtuse Marginal Branch Previously placed 1st Mrg stent of unknown type is  widely patent.  Right Coronary Artery Dist RCA lesion is 50% stenosed.  Right Posterior Descending Artery RPDA lesion is 30% stenosed. The lesion was previously treated using a drug eluting stent over 2 years ago.  Right  Posterior Atrioventricular Artery RPAV lesion is 60% stenosed.  Intervention  No interventions have been documented.   CARDIAC CATHETERIZATION  CARDIAC CATHETERIZATION 08/16/2019  Narrative  2nd Diag lesion is 80% stenosed.  Balloon angioplasty was performed using a BALLOON SAPPHIRE 2.0X12.  Post intervention, there is a 25% residual stenosis.  Prox LAD to Mid LAD lesion is 70% stenosed.  A drug-eluting stent was successfully placed using a SYNERGY XD 2.75X38, after orbital atherectomy, dilated to 3.25 mm. Stent size optimized with IVUS.  Post intervention, there is a 0% residual stenosis.  Ost Cx to Prox Cx lesion is 50% stenosed.  Dist RCA lesion is 50% stenosed.  RPDA stent that was treated a few weeks ago was widely patent.  1st Diag lesion is 75% stenosed.  LV end diastolic pressure is normal.  There is mild aortic valve stenosis. Mild gradient noted on pullback.  Continue aggressive secondary prevention.  Would recommend clopidogrel monotherapy after 6 months of DAPT given his extensive disease.  Given the residual pain from the jailed diagonals, will watch overnight and treat with pain medication.  Findings Coronary Findings Diagnostic  Dominance: Right  Left Anterior Descending Prox LAD to Mid LAD lesion is 70% stenosed. The lesion was previously treated. IVUS would not cross  First Diagonal Branch 1st Diag lesion is 75%  stenosed.  Second Diagonal Branch 2nd Diag lesion is 80% stenosed.  Left Circumflex Ost Cx to Prox Cx lesion is 50% stenosed.  Right Coronary Artery Dist RCA lesion is 50% stenosed.  Right Posterior Descending Artery Non-stenotic RPDA lesion. stent appeared underexpanded.  Vessel appeared to be 3.0 mm in diameter.  Intervention  Prox LAD to Mid LAD lesion Atherectomy CATH LAUNCHER 6FR EBU 3.75 guide catheter was inserted. WIRE VIPERWIRE COR FLEX TIP guidewire was used to cross lesion. Orbital atherectomy was performed using a  CROWN DIAMONDBACK CLASSIC 1.25. Stent CATH LAUNCHER 6FR EBU 3.75 guide catheter was inserted. Lesion crossed with guidewire using a WIRE ASAHI PROWATER 180CM. Pre-stent angioplasty was performed using a BALLOON SAPPHIRE 2.5X20. A drug-eluting stent was successfully placed using a SYNERGY XD 2.75X38. Stent strut is well apposed. Post-stent angioplasty was performed using a BALLOON SAPPHIRE [place] 3.25X15. Pre stent IVUS after athetectomy showed one focal area of concentric calcium that was treated with the 2.5 balloon.   THere was difficulty delivering the stent requiring buddy wires.Post stent IVUS showed the stent was well apposed, but there was significant underlying calcium that affected the shape of the stent. Post-Intervention Lesion Assessment The intervention was successful. Pre-interventional TIMI flow is 3. Post-intervention TIMI flow is 3. No complications occurred at this lesion. Ultrasound (IVUS) was performed on the lesion post PCI. Stent well apposed. There is a 0% residual stenosis post intervention.  2nd Diag lesion Angioplasty CATH LAUNCHER 6FR EBU 3.75 guide catheter was inserted. WIRE HI TORQ BMW 190CM guidewire used to cross lesion. Balloon angioplasty was performed using a BALLOON SAPPHIRE 2.0X12. Maximum pressure: 8 atm. Post-Intervention Lesion Assessment The intervention was successful. Pre-interventional TIMI flow is 3. Post-intervention TIMI flow is 3. No complications occurred at this lesion. There is a 25% residual stenosis post intervention.   STRESS TESTS  MYOCARDIAL PERFUSION IMAGING 12/07/2018  Narrative  The left ventricular ejection fraction is mildly decreased (45-54%).  Nuclear stress EF: 51%.  There was no ST segment deviation noted during stress.  The study is normal.  This is a low risk study.   ECHOCARDIOGRAM  ECHOCARDIOGRAM COMPLETE 04/22/2022  Narrative ECHOCARDIOGRAM REPORT    Patient Name:   AUSTAN ASHABRANNER Date of Exam: 04/22/2022 Medical Rec  #:  [id number]     Height:       69.0 in Accession #:    [id number]    Weight:       265.0 lb Date of Birth:  Jan 13, 1946      BSA:          2.328 m Patient Age:    47 years      BP:           134/70 mmHg Patient Gender: M             HR:           57 bpm. Exam Location:    Procedure: 2D Echo, Cardiac Doppler, Color Doppler and Strain Analysis  Indications:    Nonrheumatic aortic valve stenosis [I35.0 (ICD-10-CM)]  History:        Patient has prior history of Echocardiogram examinations, most recent 05/20/2021. CAD, Aortic Valve Disease, Arrythmias:Bradycardia and LBBB, Signs/Symptoms:o; Risk Factors:Hypertension and Dyslipidemia.  Sonographer:    Luane School RDCS Referring Phys: [id number] [place]   Technically difficult study due to poor echo windows. IMPRESSIONS   1. Left ventricular ejection fraction, by estimation, is 50 to 55%. The left ventricle has low normal function. Left ventricular  endocardial border not optimally defined to evaluate regional wall motion. There is moderate concentric left ventricular hypertrophy. Left ventricular diastolic parameters are consistent with Grade I diastolic dysfunction (impaired relaxation). 2. Right ventricular systolic function is normal. The right ventricular size is normal. Tricuspid regurgitation signal is inadequate for assessing PA pressure. 3. The mitral valve is normal in structure. No evidence of mitral valve regurgitation. No evidence of mitral stenosis. 4. Reduced SVI . The aortic valve is tricuspid. There is mild calcification of the aortic valve. There is mild thickening of the aortic valve. Aortic valve regurgitation is not visualized. Moderate to severe aortic valve stenosis. 5. There is mild dilatation of the ascending aorta, measuring 39 mm. 6. The inferior vena cava is normal in size with greater than 50% respiratory variability, suggesting right atrial pressure of 3 mmHg.  FINDINGS Left Ventricle: Left ventricular  ejection fraction, by estimation, is 50 to 55%. The left ventricle has low normal function. Left ventricular endocardial border not optimally defined to evaluate regional wall motion. Global longitudinal strain performed but not reported based on interpreter judgement due to suboptimal tracking. The left ventricular internal cavity size was normal in size. There is moderate concentric left ventricular hypertrophy. Left ventricular diastolic parameters are consistent with Grade I diastolic dysfunction (impaired relaxation). Indeterminate filling pressures.  Right Ventricle: The right ventricular size is normal. No increase in right ventricular wall thickness. Right ventricular systolic function is normal. Tricuspid regurgitation signal is inadequate for assessing PA pressure.  Left Atrium: Left atrial size was normal in size.  Right Atrium: Right atrial size was normal in size.  Pericardium: There is no evidence of pericardial effusion.  Mitral Valve: The mitral valve is normal in structure. No evidence of mitral valve regurgitation. No evidence of mitral valve stenosis.  Tricuspid Valve: The tricuspid valve is normal in structure. Tricuspid valve regurgitation is not demonstrated. No evidence of tricuspid stenosis.  Aortic Valve: Reduced SVI. The aortic valve is tricuspid. There is mild calcification of the aortic valve. There is mild thickening of the aortic valve. Aortic valve regurgitation is not visualized. Moderate to severe aortic stenosis is present. Aortic valve mean gradient measures 32.0 mmHg. Aortic valve peak gradient measures 49.3 mmHg. Aortic valve area, by VTI measures 0.84 cm.  Pulmonic Valve: The pulmonic valve was normal in structure. Pulmonic valve regurgitation is not visualized. No evidence of pulmonic stenosis.  Aorta: The aortic root is normal in size and structure and the aortic arch was not well visualized. There is mild dilatation of the ascending aorta, measuring 39  mm.  Venous: The pulmonary veins were not well visualized. The inferior vena cava was not well visualized. The inferior vena cava is normal in size with greater than 50% respiratory variability, suggesting right atrial pressure of 3 mmHg.  IAS/Shunts: No atrial level shunt detected by color flow Doppler.   LEFT VENTRICLE PLAX 2D LVIDd:         5.60 cm     Diastology LVIDs:         4.10 cm     LV e' medial:    3.97 cm/s LV PW:         1.50 cm     LV E/e' medial:  23.6 LV IVS:        1.50 cm     LV e' lateral:   5.11 cm/s LVOT diam:     2.10 cm     LV E/e' lateral: 18.4 LV SV:  75 LV SV Index:   32 LVOT Area:     3.46 cm  LV Volumes (MOD) LV vol d, MOD A2C: 76.1 ml LV vol d, MOD A4C: 99.5 ml LV vol s, MOD A2C: 36.7 ml LV vol s, MOD A4C: 52.4 ml LV SV MOD A2C:     39.4 ml LV SV MOD A4C:     99.5 ml LV SV MOD BP:      45.4 ml  RIGHT VENTRICLE RV S prime:     11.30 cm/s TAPSE (M-mode): 2.7 cm  LEFT ATRIUM           Index        RIGHT ATRIUM           Index LA diam:      5.00 cm 2.15 cm/m   RA Area:     18.20 cm LA Vol (A2C): 85.4 ml 36.68 ml/m  RA Volume:   50.00 ml  21.47 ml/m LA Vol (A4C): 65.1 ml 27.96 ml/m AORTIC VALVE AV Area (Vmax):    0.91 cm AV Area (Vmean):   0.82 cm AV Area (VTI):     0.84 cm AV Vmax:           351.00 cm/s AV Vmean:          266.000 cm/s AV VTI:            0.888 m AV Peak Grad:      49.3 mmHg AV Mean Grad:      32.0 mmHg LVOT Vmax:         92.00 cm/s LVOT Vmean:        62.900 cm/s LVOT VTI:          0.216 m LVOT/AV VTI ratio: 0.24  AORTA Ao Root diam: 3.20 cm Ao Asc diam:  3.90 cm  MITRAL VALVE               TRICUSPID VALVE MV Area (PHT): 2.38 cm    TR Peak grad:   6.2 mmHg MV Decel Time: 319 msec    TR Vmax:        124.00 cm/s MV E velocity: 93.80 cm/s MV A velocity: 94.30 cm/s  SHUNTS MV E/A ratio:  0.99        Systemic VTI:  0.22 m Systemic Diam: 2.10 cm  Shirlee More MD Electronically signed by Shirlee More  MD Signature Date/Time: 04/22/2022/12:52:15 PM    Final    MONITORS  LONG TERM MONITOR (3-14 DAYS) 10/25/2018  Narrative A ZIO monitor was applied for 3 days to assess palpitation following MI beginning 10/12/2018.  Rhythm throughout was sinus with bundle branch block minimum average and maximum heart rates of 41, 55 and 125 bpm.  There were no pauses of 3 seconds or greater and no episodes of sinus node or AV nodal block.  There were 3 triggered and 3 diary events predominantly associated with PVCs.  Ventricular ectopy was rare with isolated PVCs.  Supraventricular ectopy was rare there were no episodes of atrial fibrillation or flutter.  There were 3 brief episodes of runs of atrial premature contractions the longest 6 complexes at a rate of 113 bpm.   Conclusion symptomatic PVCs, however PVC burden is low and the occurrence of palpitation is infrequent            Patient Profile     77 y.o. male with severe coronary artery disease, severe aortic stenosis, Jehovah's Witness  Assessment & Plan    Severe  coronary artery disease Severe aortic stenosis Jehovah's Witness - No blood products - Cardiac catheterization reviewed with Dr. Angelena Form.  Too high risk for percutaneous intervention.  Surgical consultation.  Jehovah's Witness.  He would need bypass, aortic valve replacement.  Understand complexities of situation especially with no blood products.  He and his wife once again reiterated this to me.   -Off of Plavix, last dose 07/03/2022. -Continue aspirin 81 -Will consult cardiothoracic surgery.  Hyperlipidemia - On pravastatin 20 mg, Zetia 10 mg  Left bundle branch block - Chronic.  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  Hypertension - Reasonable blood pressure currently.  Supposedly taking ARB at home does not know which one.  For questions or updates, please contact [place] Please consult [email or web address] for contact info under        Signed, Candee Furbish, MD  07/05/2022, 9:43 AM

## 2022-07-05 NOTE — Consult Note (Signed)
[address]       [place] [address]             [phone]           Hollie J Bark [facility] Medical Record [id number] Date of Birth: August 13, 1945  No ref. provider found Gara Kroner, DO  Chief Complaint:    Chief Complaint  Patient presents with   Chest Pain   SOB    History of Present Illness:     Pt is a pleasant 76 yo [place] witness male who has long history of CAD with multiple PCI in the past. Pt with known AS also who presented with increasing DOE and CP. Pt had echo earlier this year with EF of 50% and mean gradient of AV of 16mmHg. He was followed but because of the increasing symptoms he was admitted to hospital and cath now with severe LM stenosis with patent stents but also with 50% RCA stenosis. Pt was felt to be a high risk catheter based therapy for his CAD and AS. Pt is acceptable as a [employer] witness to CPB and the cell saver.       Past Medical History:  Diagnosis Date   Aortic stenosis 08/10/2015   Bilateral leg edema 08/22/2015   Bradycardia 06/01/2017   Cellulitis of left lower extremity 04/29/2017   Coronary artery disease involving native coronary artery 10/22/2017   DOE (dyspnea on exertion) 05/04/2017   Essential hypertension 08/13/2015   Heart murmur 04/29/2017   Kissing, osteophytes 08/22/2015   LBBB (left bundle branch block) 08/22/2015   Lipid disorder 11/30/2017   Lumbar facet joint pain 08/22/2015   Medicare annual wellness visit, subsequent 06/02/2016   Morbid obesity ([place]) 10/22/2017   Murmur 04/29/2017   Myopathy 09/02/2019   Formatting of this note might be different from the original. Secondary to high freq statin use.   Need for vaccination for Strep pneumoniae 03/27/2017   Non-ST elevation myocardial infarction (NSTEMI), subendocardial infarction, subsequent episode of care ([place]) 11/30/2017   Obesity, Class II, BMI 35-39.9 08/10/2015   OSA on CPAP 12/02/2017   Pure hypercholesterolemia 05/24/2015    Seborrheic keratoses 03/27/2017   Overview:  Of the right forehead.   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   Venous insufficiency 08/10/2015    Past Surgical History:  Procedure Laterality Date   CARDIAC CATHETERIZATION     CHOLECYSTECTOMY     CORONARY ANGIOPLASTY     CORONARY ATHERECTOMY N/A 08/16/2019   Procedure: CORONARY ATHERECTOMY;  Surgeon: Jettie Booze, MD;  Location: [facility];  Service: Cardiovascular;  Laterality: N/A;   CORONARY BALLOON ANGIOPLASTY N/A 07/28/2019   Procedure: CORONARY BALLOON ANGIOPLASTY;  Surgeon: Jettie Booze, MD;  Location: [facility];  Service: Cardiovascular;  Laterality: N/A;   ESOPHAGOGASTRODUODENOSCOPY     INTRAVASCULAR ULTRASOUND/IVUS N/A 07/28/2019   Procedure: Intravascular Ultrasound/IVUS;  Surgeon: Jettie Booze, MD;  Location: [facility];  Service: Cardiovascular;  Laterality: N/A;   INTRAVASCULAR ULTRASOUND/IVUS N/A 08/16/2019   Procedure: Intravascular Ultrasound/IVUS;  Surgeon: Jettie Booze, MD;  Location: [facility];  Service: Cardiovascular;  Laterality: N/A;   LEFT HEART CATH AND CORONARY ANGIOGRAPHY N/A 07/28/2019   Procedure: LEFT HEART CATH AND CORONARY ANGIOGRAPHY;  Surgeon: Jettie Booze, MD;  Location: [facility];  Service: Cardiovascular;  Laterality: N/A;  LEFT HEART CATH AND CORONARY ANGIOGRAPHY N/A 08/16/2019   Procedure: LEFT HEART CATH AND CORONARY ANGIOGRAPHY;  Surgeon: Jettie Booze, MD;  Location: [facility];  Service: Cardiovascular;  Laterality: N/A;    Social History   Tobacco Use  Smoking Status Former   Types: Cigarettes   Quit date: 1973   Years since quitting: 51.2   Passive exposure: Past  Smokeless Tobacco Never    Social History   Substance and Sexual Activity  Alcohol Use Yes   Alcohol/week: 7.0 standard  drinks of alcohol   Types: 7 Glasses of wine per week   Comment: per week    Social History   Socioeconomic History   Marital status: Married    Spouse name: Not on file   Number of children: Not on file   Years of education: Not on file   Highest education level: Not on file  Occupational History   Not on file  Tobacco Use   Smoking status: Former    Types: Cigarettes    Quit date: 1973    Years since quitting: 51.2    Passive exposure: Past   Smokeless tobacco: Never  Vaping Use   Vaping Use: Never used  Substance and Sexual Activity   Alcohol use: Yes    Alcohol/week: 7.0 standard drinks of alcohol    Types: 7 Glasses of wine per week    Comment: per week   Drug use: No   Sexual activity: Not on file  Other Topics Concern   Not on file  Social History Narrative   Not on file   Social Determinants of Health   Financial Resource Strain: Not on file  Food Insecurity: No Food Insecurity (07/04/2022)   Hunger Vital Sign    Worried About Running Out of Food in the Last Year: Never true    Ran Out of Food in the Last Year: Never true  Transportation Needs: No Transportation Needs (07/04/2022)   PRAPARE - Hydrologist (Medical): No    Lack of Transportation (Non-Medical): No  Physical Activity: Not on file  Stress: Not on file  Social Connections: Not on file  Intimate Partner Violence: Not At Risk (07/04/2022)   Humiliation, Afraid, Rape, and Kick questionnaire    Fear of Current or Ex-Partner: No    Emotionally Abused: No    Physically Abused: No    Sexually Abused: No    Allergies  Allergen Reactions   Penicillins     Had rash in his 30s-40s with this as well as recurrence when re-trialed   Statins Other (See Comments)    Muscle pains; increasing with continued use.   Muscle pains; increasing with continued use.    Isosorbide Other (See Comments)    "makes me dizzy and I have headaches"   Latex Rash    Has noticed with  bandages in past, leaves a sore on skin but more recently with APAP mask     Current Facility-Administered Medications  Medication Dose Route Frequency Provider Last Rate Last Admin   0.9 %  sodium chloride infusion  250 mL Intravenous PRN Burnell Blanks, MD       0.9 %  sodium chloride infusion  250 mL Intravenous PRN Burnell Blanks, MD       acetaminophen (TYLENOL) tablet 650 mg  650 mg Oral Q4H PRN Burnell Blanks, MD       aspirin EC tablet 81 mg  81 mg Oral  QHS Burnell Blanks, MD   81 mg at 07/04/22 2129   ezetimibe (ZETIA) tablet 10 mg  10 mg Oral QHS Burnell Blanks, MD   10 mg at 07/04/22 2129   furosemide (LASIX) injection 40 mg  40 mg Intravenous Q12H Burnell Blanks, MD   40 mg at 07/05/22 0758   heparin ADULT infusion 100 units/mL (25000 units/264mL)  1,550 Units/hr Intravenous Continuous Tyrone Apple, RPH 15.5 mL/hr at 07/05/22 0338 1,550 Units/hr at 07/05/22 0338   ondansetron (ZOFRAN) injection 4 mg  4 mg Intravenous Q6H PRN Burnell Blanks, MD       pravastatin (PRAVACHOL) tablet 20 mg  20 mg Oral Once per day on Thu Fri Burnell Blanks, MD   20 mg at 07/04/22 [id number]   sodium chloride flush (NS) 0.9 % injection 3 mL  3 mL Intravenous Q12H Burnell Blanks, MD   3 mL at 07/05/22 0800   sodium chloride flush (NS) 0.9 % injection 3 mL  3 mL Intravenous Q12H Burnell Blanks, MD   3 mL at 07/04/22 1010   sodium chloride flush (NS) 0.9 % injection 3 mL  3 mL Intravenous PRN Burnell Blanks, MD       sodium chloride flush (NS) 0.9 % injection 3 mL  3 mL Intravenous Q12H Burnell Blanks, MD   3 mL at 07/04/22 2129   sodium chloride flush (NS) 0.9 % injection 3 mL  3 mL Intravenous PRN Burnell Blanks, MD         Family History  Problem Relation Age of Onset   Cancer Mother    Diabetes Sister    Diabetes Brother    Hypertension Brother        Physical Exam: BP 128/79 (BP  Location: Right Arm)   Pulse (!) 58   Temp 98 F (36.7 C) (Oral)   Resp 18   Ht 5\' 9"  (1.753 m)   Wt 117 kg   SpO2 96%   BMI 38.10 kg/m  Lungs:decreased at bases Card: RR with 3/6 sem Ext: no edema, no varicosities Teeth in good repair Neuro: intact    Diagnostic Studies & Laboratory data: I have personally reviewed the following studies and agree with the findings     Recent Radiology Findings:   CARDIAC CATHETERIZATION  Result Date: 07/04/2022   Dist RCA lesion is 50% stenosed.   2nd Diag lesion is 25% stenosed.   1st Diag lesion is 75% stenosed.   RPDA lesion is 30% stenosed.   RPAV lesion is 60% stenosed.   Prox LAD to Mid LAD lesion is 20% stenosed.   Ost Cx to Prox Cx lesion is 90% stenosed.   Mid LM to Ost LAD lesion is 80% stenosed.   Ost LAD lesion is 80% stenosed.   Previously placed 1st Mrg stent of unknown type is  widely patent. Severe distal left main stenosis (IVUS minimum lumen area of 4.0 mm2) Severe ostial LAD stenosis (IVUD minimum lumen area of 3.5 mm2). Patent mid LAD stent. Severe ostial Circumflex stenosis. Patent obtuse marginal stent Large dominant RCA with moderate distal stenosis, patent PDA stent, moderate posterolateral artery stenosis. Severe aortic stenosis by echo (Cath data: mean gradient 32.8 mmHg, peak to peak gradient 42 mmHg) Recommendations: Severe distal left main stenosis involving both the LAD and Circumflex, confirmed with IVUS imaging. Severe aortic stenosis. He will need a CT surgery consult for CABG and AVR. He has been on Plavix so I  will begin holding that tonight. Repeat echo tomorrow. 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 He will also need Plavix washout before surgery. Resume IV heparin 8 hours post sheath pull. Will call CT surgery to see him tomorrow.   DG Chest 2 View  Result Date: 07/03/2022 CLINICAL DATA:  Provided history: Chest pain. EXAM: CHEST - 2 VIEW COMPARISON:   Prior chest radiographs 11/25/2021 and earlier. FINDINGS: Stable cardiac and mediastinal contours as compared to the prior chest radiographs of 11/25/2021. Aortic atherosclerosis. Chronic elevation of the right hemidiaphragm. No appreciable airspace consolidation or pulmonary edema. No evidence of pleural effusion or pneumothorax. No acute osseous abnormality identified. Surgical clips within the upper abdomen. IMPRESSION: No evidence of acute cardiopulmonary abnormality. Aortic Atherosclerosis (ICD10-I70.0). Electronically Signed   By: Kellie Simmering D.O.   On: 07/03/2022 16:10     TTE (04/2022) IMPRESSIONS     1. Left ventricular ejection fraction, by estimation, is 50 to 55%. The  left ventricle has low normal function. Left ventricular endocardial  border not optimally defined to evaluate regional wall motion. There is  moderate concentric left ventricular  hypertrophy. Left ventricular diastolic parameters are consistent with  Grade I diastolic dysfunction (impaired relaxation).   2. Right ventricular systolic function is normal. The right ventricular  size is normal. Tricuspid regurgitation signal is inadequate for assessing  PA pressure.   3. The mitral valve is normal in structure. No evidence of mitral valve  regurgitation. No evidence of mitral stenosis.   4. Reduced SVI      . The aortic valve is tricuspid. There is mild calcification of the  aortic valve. There is mild thickening of the aortic valve. Aortic valve  regurgitation is not visualized. Moderate to severe aortic valve stenosis.   5. There is mild dilatation of the ascending aorta, measuring 39 mm.   6. The inferior vena cava is normal in size with greater than 50%  respiratory variability, suggesting right atrial pressure of 3 mmHg.   FINDINGS   Left Ventricle: Left ventricular ejection fraction, by estimation, is 50  to 55%. The left ventricle has low normal function. Left ventricular  endocardial border not optimally  defined to evaluate regional wall motion.  Global longitudinal strain performed   but not reported based on interpreter judgement due to suboptimal  tracking. The left ventricular internal cavity size was normal in size.  There is moderate concentric left ventricular hypertrophy. Left  ventricular diastolic parameters are consistent  with Grade I diastolic dysfunction (impaired relaxation). Indeterminate  filling pressures.   Right Ventricle: The right ventricular size is normal. No increase in  right ventricular wall thickness. Right ventricular systolic function is  normal. Tricuspid regurgitation signal is inadequate for assessing PA  pressure.   Left Atrium: Left atrial size was normal in size.   Right Atrium: Right atrial size was normal in size.   Pericardium: There is no evidence of pericardial effusion.   Mitral Valve: The mitral valve is normal in structure. No evidence of  mitral valve regurgitation. No evidence of mitral valve stenosis.   Tricuspid Valve: The tricuspid valve is normal in structure. Tricuspid  valve regurgitation is not demonstrated. No evidence of tricuspid  stenosis.   Aortic Valve: Reduced SVI.  The aortic valve is tricuspid. There is mild calcification of the aortic  valve. There is mild thickening of the aortic valve. Aortic valve  regurgitation is not visualized. Moderate  to severe aortic stenosis is  present. Aortic valve mean gradient measures   32.0 mmHg. Aortic valve peak gradient measures 49.3 mmHg. Aortic valve  area, by VTI measures 0.84 cm.   Pulmonic Valve: The pulmonic valve was normal in structure. Pulmonic valve  regurgitation is not visualized. No evidence of pulmonic stenosis.   Aorta: The aortic root is normal in size and structure and the aortic arch  was not well visualized. There is mild dilatation of the ascending aorta,  measuring 39 mm.   Venous: The pulmonary veins were not well visualized. The inferior vena  cava was  not well visualized. The inferior vena cava is normal in size  with greater than 50% respiratory variability, suggesting right atrial  pressure of 3 mmHg.   IAS/Shunts: No atrial level shunt detected by color flow Doppler.     LEFT VENTRICLE  PLAX 2D  LVIDd:         5.60 cm     Diastology  LVIDs:         4.10 cm     LV e' medial:    3.97 cm/s  LV PW:         1.50 cm     LV E/e' medial:  23.6  LV IVS:        1.50 cm     LV e' lateral:   5.11 cm/s  LVOT diam:     2.10 cm     LV E/e' lateral: 18.4  LV SV:         75  LV SV Index:   32  LVOT Area:     3.46 cm    LV Volumes (MOD)  LV vol d, MOD A2C: 76.1 ml  LV vol d, MOD A4C: 99.5 ml  LV vol s, MOD A2C: 36.7 ml  LV vol s, MOD A4C: 52.4 ml  LV SV MOD A2C:     39.4 ml  LV SV MOD A4C:     99.5 ml  LV SV MOD BP:      45.4 ml   RIGHT VENTRICLE  RV S prime:     11.30 cm/s  TAPSE (M-mode): 2.7 cm   LEFT ATRIUM           Index        RIGHT ATRIUM           Index  LA diam:      5.00 cm 2.15 cm/m   RA Area:     18.20 cm  LA Vol (A2C): 85.4 ml 36.68 ml/m  RA Volume:   50.00 ml  21.47 ml/m  LA Vol (A4C): 65.1 ml 27.96 ml/m   AORTIC VALVE  AV Area (Vmax):    0.91 cm  AV Area (Vmean):   0.82 cm  AV Area (VTI):     0.84 cm  AV Vmax:           351.00 cm/s  AV Vmean:          266.000 cm/s  AV VTI:            0.888 m  AV Peak Grad:      49.3 mmHg  AV Mean Grad:      32.0 mmHg  LVOT Vmax:         92.00 cm/s  LVOT Vmean:        62.900 cm/s  LVOT VTI:          0.216 m  LVOT/AV VTI ratio: 0.24  AORTA  Ao Root diam: 3.20 cm  Ao Asc diam:  3.90 cm   MITRAL VALVE               TRICUSPID VALVE  MV Area (PHT): 2.38 cm    TR Peak grad:   6.2 mmHg  MV Decel Time: 319 msec    TR Vmax:        124.00 cm/s  MV E velocity: 93.80 cm/s  MV A velocity: 94.30 cm/s  SHUNTS  MV E/A ratio:  0.99        Systemic VTI:  0.22 m                             Systemic Diam: 2.10 cm    Recent Lab Findings: Lab Results  Component Value Date    WBC 6.6 07/05/2022   HGB 15.5 07/05/2022   HCT 43.3 07/05/2022   PLT 141 (L) 07/05/2022   GLUCOSE 102 (H) 07/05/2022   CHOL 184 07/04/2022   TRIG 196 (H) 07/04/2022   HDL 37 (L) 07/04/2022   LDLCALC 108 (H) 07/04/2022   ALT 37 07/04/2022   AST 26 07/04/2022   NA 138 07/05/2022   K 4.3 07/05/2022   CL 104 07/05/2022   CREATININE 1.06 07/05/2022   BUN 17 07/05/2022   CO2 27 07/05/2022   TSH 4.966 (H) 07/04/2022   HGBA1C 5.0 11/28/2019      Assessment / Plan:     77 yo wm with NYHA class 3 symptoms of CAD and AS. Pt with preserved LV function and should be offered AVR and CABG. 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 He is acceptable to the cell saver. His HCT is normal and he is a large pt so should be better in these regards. He is on Plavix and will need 7 day washout to be certain no issues with surgery. I will try to get him on epo injections prior.  All the risks and goals of surgery were discussed and he wishes to proceed.   I have spent 65 min in review of the records, viewing studies and in face to face with patient and in coordination of future care    Coralie Common 07/05/2022 10:50 AM

## 2022-07-05 NOTE — Progress Notes (Signed)
[place] for heparin  Indication: chest pain/ACS  Allergies  Allergen Reactions   Penicillins     Had rash in his 30s-40s with this as well as recurrence when re-trialed   Statins Other (See Comments)    Muscle pains; increasing with continued use.   Muscle pains; increasing with continued use.    Isosorbide Other (See Comments)    "makes me dizzy and I have headaches"   Latex Rash    Has noticed with bandages in past, leaves a sore on skin but more recently with APAP mask     Patient Measurements: Height: 5\' 9"  (175.3 cm) Weight: 117 kg (258 lb) IBW/kg (Calculated) : 70.7 Heparin Dosing Weight: 97.2kg   Vital Signs: Temp: 98 F (36.7 C) (03/16 0804) Temp Source: Oral (03/16 0804) BP: 128/79 (03/16 0804) Pulse Rate: 58 (03/16 0804)  Labs: Recent Labs    07/03/22 1529 07/03/22 1826 07/04/22 0258 07/04/22 0620 07/04/22 1130 07/04/22 1727 07/04/22 1729 07/05/22 0129 07/05/22 0939  HGB 17.0  --  15.9  --   --  13.9 15.0 15.5  --   HCT 47.0  --  46.0  --   --  41.0 44.0 43.3  --   PLT 150  --  136*  --   --   --   --  141*  --   HEPARINUNFRC  --   --  <0.10*  --  0.32  --   --   --  0.41  CREATININE 1.08  --  1.06  --   --   --   --  1.06  --   TROPONINIHS 49* 114*  --  86*  --   --   --   --   --      Estimated Creatinine Clearance: 72.5 mL/min (by C-G formula based on SCr of 1.06 mg/dL).   Assessment: 68 YOM admitted with CC of SOB and CP on exertion and started on IV heparin.  Now s/p cath and Pharmacy to resume IV heparin 8 hours post sheath removal while awaiting evaluation for CABG and AVR. Sheath removed at 1811. No bleeding nor hematoma documented.  Heparin level therapeutic at 0.41 on 1550 units/hour. Hgb stable at 15.5, platelets 141. No issues with infusion reported, no signs/symptoms of bleeding noted.  Goal of Therapy:  Heparin level 0.3-0.7 units/ml Monitor platelets by anticoagulation protocol: Yes    Plan:  Continue heparin infusion at 1550 units/hour Monitor daily heparin level, CBC, signs/symptoms of bleeding F/u plans for CABG  Louanne Belton, PharmD PGY1 Pharmacy Resident 07/05/2022 11:50 AM

## 2022-07-05 NOTE — Progress Notes (Signed)
  Echocardiogram 2D Echocardiogram has been performed.  Craig Lawson 07/05/2022, 12:46 PM

## 2022-07-06 DIAGNOSIS — I214 Non-ST elevation (NSTEMI) myocardial infarction: Secondary | ICD-10-CM | POA: Diagnosis not present

## 2022-07-06 LAB — CBC
HCT: 44.6 % (ref 39.0–52.0)
Hemoglobin: 16.5 g/dL (ref 13.0–17.0)
MCH: 32 pg (ref 26.0–34.0)
MCHC: 37 g/dL — ABNORMAL HIGH (ref 30.0–36.0)
MCV: 86.4 fL (ref 80.0–100.0)
Platelets: 136 10*3/uL — ABNORMAL LOW (ref 150–400)
RBC: 5.16 MIL/uL (ref 4.22–5.81)
RDW: 13.3 % (ref 11.5–15.5)
WBC: 6.2 10*3/uL (ref 4.0–10.5)
nRBC: 0 % (ref 0.0–0.2)

## 2022-07-06 LAB — HEPARIN LEVEL (UNFRACTIONATED): Heparin Unfractionated: 0.35 IU/mL (ref 0.30–0.70)

## 2022-07-06 MED ORDER — ROSUVASTATIN CALCIUM 20 MG PO TABS
20.0000 mg | ORAL_TABLET | Freq: Every day | ORAL | Status: DC
  Administered 2022-07-06 – 2022-07-22 (×16): 20 mg via ORAL
  Filled 2022-07-06 (×16): qty 1

## 2022-07-06 MED ORDER — DARBEPOETIN ALFA 300 MCG/0.6ML IJ SOSY
300.0000 ug | PREFILLED_SYRINGE | Freq: Once | INTRAMUSCULAR | Status: AC
  Administered 2022-07-06: 300 ug via SUBCUTANEOUS
  Filled 2022-07-06: qty 0.6

## 2022-07-06 MED ORDER — FERROUS SULFATE 325 (65 FE) MG PO TABS
325.0000 mg | ORAL_TABLET | Freq: Every day | ORAL | Status: DC
  Administered 2022-07-07 – 2022-07-22 (×15): 325 mg via ORAL
  Filled 2022-07-06 (×15): qty 1

## 2022-07-06 NOTE — Progress Notes (Signed)
[place] for heparin  Indication: chest pain/ACS  Allergies  Allergen Reactions   Penicillins     Had rash in his 30s-40s with this as well as recurrence when re-trialed   Statins Other (See Comments)    Muscle pains; increasing with continued use.   Muscle pains; increasing with continued use.    Isosorbide Other (See Comments)    "makes me dizzy and I have headaches"   Latex Rash    Has noticed with bandages in past, leaves a sore on skin but more recently with APAP mask     Patient Measurements: Height: 5\' 9"  (175.3 cm) Weight: 115.8 kg (255 lb 4.8 oz) IBW/kg (Calculated) : 70.7 Heparin Dosing Weight: 97.2kg   Vital Signs: Temp: 97.9 F (36.6 C) (03/17 0800) Temp Source: Oral (03/17 0800) BP: 126/73 (03/17 0800) Pulse Rate: 60 (03/17 0800)  Labs: Recent Labs    07/03/22 1529 07/03/22 1529 07/03/22 1826 07/04/22 0258 07/04/22 0620 07/04/22 1130 07/04/22 1727 07/04/22 1729 07/05/22 0129 07/05/22 0939 07/06/22 0208  HGB 17.0  --   --  15.9  --   --    < > 15.0 15.5  --  16.5  HCT 47.0  --   --  46.0  --   --    < > 44.0 43.3  --  44.6  PLT 150  --   --  136*  --   --   --   --  141*  --  136*  HEPARINUNFRC  --    < >  --  <0.10*  --  0.32  --   --   --  0.41 0.35  CREATININE 1.08  --   --  1.06  --   --   --   --  1.06  --   --   TROPONINIHS 49*  --  114*  --  86*  --   --   --   --   --   --    < > = values in this interval not displayed.    Estimated Creatinine Clearance: 72.1 mL/min (by C-G formula based on SCr of 1.06 mg/dL).   Assessment: Craig Lawson admitted with CC of SOB and CP on exertion and started on IV heparin.  Now s/p cath and Pharmacy to resume IV heparin 8 hours post sheath removal while awaiting evaluation for CABG and AVR. Sheath removed at 1811. No bleeding nor hematoma documented.  Heparin level is therapeutic at 0.35 on 1550 units/hour. CBC is stable. No issues with infusion reported, no signs or  symptoms of bleeding noted.   Goal of Therapy:  Heparin level 0.3-0.7 units/ml Monitor platelets by anticoagulation protocol: Yes   Plan:  Continue heparin infusion at 1550 units/hour Monitor daily heparin level, CBC, signs/symptoms of bleeding F/u plans for CABG  Louanne Belton, PharmD PGY1 Pharmacy Resident 07/06/2022 8:19 AM

## 2022-07-06 NOTE — Progress Notes (Signed)
Rounding Note    Patient Name: Craig Lawson Date of Encounter: 07/06/2022  [place] Cardiologist: Shirlee More, MD   Subjective   Doing well feeling comfortable, no further chest discomfort, IV heparin.  Just got off the phone with family.  Appreciate Dr. Nicholos Johns consultation.  Inpatient Medications    Scheduled Meds:  aspirin EC  81 mg Oral QHS   darbepoetin (ARANESP) injection - NON-DIALYSIS  300 mcg Subcutaneous Once   ezetimibe  10 mg Oral QHS   [START ON 07/07/2022] ferrous sulfate  325 mg Oral Q breakfast   furosemide  40 mg Intravenous Q12H   pneumococcal 20-valent conjugate vaccine  0.5 mL Intramuscular Tomorrow-1000   rosuvastatin  20 mg Oral Daily   Continuous Infusions:  heparin 1,550 Units/hr (07/05/22 1826)   PRN Meds: acetaminophen, ondansetron (ZOFRAN) IV   Vital Signs    Vitals:   07/05/22 1652 07/05/22 1954 07/06/22 0450 07/06/22 0800  BP: (!) 149/85 133/76  126/73  Pulse: 62 61  60  Resp: 20 18  18   Temp: 98 F (36.7 C) 98 F (36.7 C)  97.9 F (36.6 C)  TempSrc: Oral Oral  Oral  SpO2: 95% 96%  96%  Weight:   115.8 kg   Height:        Intake/Output Summary (Last 24 hours) at 07/06/2022 0952 Last data filed at 07/06/2022 0912 Gross per 24 hour  Intake 378.8 ml  Output 2600 ml  Net -2221.2 ml      07/06/2022    4:50 AM 07/05/2022    3:09 AM 07/03/2022    6:34 PM  Last 3 Weights  Weight (lbs) 255 lb 4.8 oz 258 lb 260 lb  Weight (kg) 115.803 kg 117.028 kg 117.935 kg      Telemetry    Normal sinus rhythm with interventricular conduction delay- Personally Reviewed  ECG    Sinus rhythm - Personally Reviewed  Physical Exam   GEN: No acute distress.   Neck: No JVD Cardiac: RRR, 3/6 systolic murmur, no rubs, or gallops.  Respiratory: Clear to auscultation bilaterally. GI: Soft, nontender, non-distended  MS: No edema; No deformity. Neuro:  Nonfocal  Psych: Normal affect no changes  Labs    High Sensitivity  Troponin:   Recent Labs  Lab 07/03/22 1529 07/03/22 1826 07/04/22 0620  TROPONINIHS 49* 114* 86*     Chemistry Recent Labs  Lab 07/03/22 1529 07/04/22 0258 07/04/22 1727 07/04/22 1729 07/05/22 0129  NA 140 138 143 139 138  K 4.4 3.6 3.5 3.9 4.3  CL 101 102  --   --  104  CO2 27 24  --   --  27  GLUCOSE 99 96  --   --  102*  BUN 22 20  --   --  17  CREATININE 1.08 1.06  --   --  1.06  CALCIUM 9.6 9.1  --   --  8.6*  PROT  --  6.0*  --   --   --   ALBUMIN  --  3.6  --   --   --   AST  --  26  --   --   --   ALT  --  37  --   --   --   ALKPHOS  --  21*  --   --   --   BILITOT  --  1.2  --   --   --   GFRNONAA >60 >60  --   --  >  60  ANIONGAP 12 12  --   --  7    Lipids  Recent Labs  Lab 07/04/22 0258  CHOL 184  TRIG 196*  HDL 37*  LDLCALC 108*  CHOLHDL 5.0    Hematology Recent Labs  Lab 07/04/22 0258 07/04/22 1727 07/04/22 1729 07/05/22 0129 07/06/22 0208  WBC 5.8  --   --  6.6 6.2  RBC 5.17  --   --  4.94 5.16  HGB 15.9   < > 15.0 15.5 16.5  HCT 46.0   < > 44.0 43.3 44.6  MCV 89.0  --   --  87.7 86.4  MCH 30.8  --   --  31.4 32.0  MCHC 34.6  --   --  35.8 37.0*  RDW 13.3  --   --  13.4 13.3  PLT 136*  --   --  141* 136*   < > = values in this interval not displayed.   Thyroid  Recent Labs  Lab 07/04/22 0258  TSH 4.966*    BNP Recent Labs  Lab 07/03/22 1529  BNP 141.9*    DDimer No results for input(s): "DDIMER" in the last 168 hours.   Radiology    CT CHEST WO CONTRAST  Result Date: 07/05/2022 CLINICAL DATA:  Thoracic aorta disease, pre-op planning EXAM: CT CHEST WITHOUT CONTRAST TECHNIQUE: Multidetector CT imaging of the chest was performed following the standard protocol without IV contrast. RADIATION DOSE REDUCTION: This exam was performed according to the departmental dose-optimization program which includes automated exposure control, adjustment of the mA and/or kV according to patient size and/or use of iterative reconstruction  technique. COMPARISON:  None Available. FINDINGS: Cardiovascular: Diffuse coronary artery calcifications. Moderate aortic calcifications. No aneurysm. Maximum aortic diameter 3.6 cm in the ascending thoracic aorta. Mediastinum/Nodes: Scattered mediastinal lymph nodes. No mediastinal, hilar, or axillary adenopathy. Trachea and esophagus are unremarkable. Thyroid unremarkable. Lungs/Pleura: No confluent opacities or effusions. Upper Abdomen: No acute findings Musculoskeletal: Chest wall soft tissues are unremarkable. No acute bony abnormality. IMPRESSION: Diffuse coronary artery disease. No evidence of aortic aneurysm. No acute cardiopulmonary disease. Aortic Atherosclerosis (ICD10-I70.0). Electronically Signed   By: Rolm Baptise M.D.   On: 07/05/2022 21:28   ECHOCARDIOGRAM COMPLETE  Result Date: 07/05/2022    ECHOCARDIOGRAM REPORT   Patient Name:   Craig Lawson Date of Exam: 07/05/2022 Medical Rec #:  [id number]     Height:       69.0 in Accession #:    [id number]    Weight:       258.0 lb Date of Birth:  [birth year]      BSA:          2.302 m Patient Age:    5 years      BP:           128/79 mmHg Patient Gender: M             HR:           58 bpm. Exam Location:  Inpatient Procedure: Color Doppler, Cardiac Doppler and 2D Echo Indications:    CAD  History:        Patient has prior history of Echocardiogram examinations. CAD,                 TIA, Arrythmias:LBBB and Bradycardia, Signs/Symptoms:Murmur;                 Risk Factors:Hypertension.  Sonographer:    Marella Chimes Referring Phys: Burnell Blanks  Sonographer Comments: Patient is obese. IMPRESSIONS  1. The aortic valve is calcified. There is severe calcifcation of the aortic valve. There is moderate thickening of the aortic valve. Aortic valve regurgitation is mild. Moderate aortic valve stenosis. Aortic valve mean gradient measures 28.0 mmHg. Aortic valve Vmax measures 3.63 m/s.  2. Left ventricular ejection fraction, by estimation, is 60 to 65%. The  left ventricle has normal function. The left ventricle has no regional wall motion abnormalities. Left ventricular diastolic parameters are consistent with Grade I diastolic dysfunction (impaired relaxation).  3. Right ventricular systolic function is normal. The right ventricular size is normal.  4. Left atrial size was mildly dilated.  5. The mitral valve is normal in structure. No evidence of mitral valve regurgitation. No evidence of mitral stenosis.  6. The inferior vena cava is normal in size with greater than 50% respiratory variability, suggesting right atrial pressure of 3 mmHg. FINDINGS  Left Ventricle: Left ventricular ejection fraction, by estimation, is 60 to 65%. The left ventricle has normal function. The left ventricle has no regional wall motion abnormalities. The left ventricular internal cavity size was normal in size. There is  no left ventricular hypertrophy. Left ventricular diastolic parameters are consistent with Grade I diastolic dysfunction (impaired relaxation). Right Ventricle: The right ventricular size is normal. No increase in right ventricular wall thickness. Right ventricular systolic function is normal. Left Atrium: Left atrial size was mildly dilated. Right Atrium: Right atrial size was normal in size. Pericardium: There is no evidence of pericardial effusion. Mitral Valve: The mitral valve is normal in structure. Mild mitral annular calcification. No evidence of mitral valve regurgitation. No evidence of mitral valve stenosis. Tricuspid Valve: The tricuspid valve is normal in structure. Tricuspid valve regurgitation is not demonstrated. No evidence of tricuspid stenosis. Aortic Valve: The aortic valve is calcified. There is severe calcifcation of the aortic valve. There is moderate thickening of the aortic valve. Aortic valve regurgitation is mild. Moderate aortic stenosis is present. Aortic valve mean gradient measures 28.0 mmHg. Aortic valve peak gradient measures 52.7 mmHg.  Pulmonic Valve: The pulmonic valve was normal in structure. Pulmonic valve regurgitation is trivial. No evidence of pulmonic stenosis. Aorta: The aortic root is normal in size and structure. Venous: The inferior vena cava is normal in size with greater than 50% respiratory variability, suggesting right atrial pressure of 3 mmHg. IAS/Shunts: No atrial level shunt detected by color flow Doppler.  LEFT VENTRICLE PLAX 2D LVIDd:         6.30 cm Diastology LVIDs:         4.60 cm LV e' medial:    4.46 cm/s LV PW:         1.00 cm LV E/e' medial:  7.1 LV IVS:        1.00 cm LV e' lateral:   7.07 cm/s                        LV E/e' lateral: 4.5  RIGHT VENTRICLE RV S prime:     14.00 cm/s TAPSE (M-mode): 2.1 cm LEFT ATRIUM             Index LA diam:        4.50 cm 1.95 cm/m LA Vol (A2C):   43.5 ml 18.90 ml/m LA Vol (A4C):   31.2 ml 13.55 ml/m LA Biplane Vol: 40.1 ml 17.42 ml/m  AORTIC VALVE AV Vmax:           363.00 cm/s  AV Vmean:          247.000 cm/s AV VTI:            0.752 m AV Peak Grad:      52.7 mmHg AV Mean Grad:      28.0 mmHg LVOT Vmax:         113.00 cm/s LVOT Vmean:        73.400 cm/s LVOT VTI:          0.216 m LVOT/AV VTI ratio: 0.29  AORTA Ao Asc diam: 3.90 cm MITRAL VALVE MV Area (PHT): 2.16 cm     SHUNTS MV Decel Time: 352 msec     Systemic VTI: 0.22 m MV E velocity: 31.83 cm/s MV A velocity: 106.00 cm/s MV E/A ratio:  0.30 Candee Furbish MD Electronically signed by Candee Furbish MD Signature Date/Time: 07/05/2022/3:08:16 PM    Final    CARDIAC CATHETERIZATION  Result Date: 07/04/2022   Dist RCA lesion is 50% stenosed.   2nd Diag lesion is 25% stenosed.   1st Diag lesion is 75% stenosed.   RPDA lesion is 30% stenosed.   RPAV lesion is 60% stenosed.   Prox LAD to Mid LAD lesion is 20% stenosed.   Ost Cx to Prox Cx lesion is 90% stenosed.   Mid LM to Ost LAD lesion is 80% stenosed.   Ost LAD lesion is 80% stenosed.   Previously placed 1st Mrg stent of unknown type is  widely patent. Severe distal left main  stenosis (IVUS minimum lumen area of 4.0 mm2) Severe ostial LAD stenosis (IVUD minimum lumen area of 3.5 mm2). Patent mid LAD stent. Severe ostial Circumflex stenosis. Patent obtuse marginal stent Large dominant RCA with moderate distal stenosis, patent PDA stent, moderate posterolateral artery stenosis. Severe aortic stenosis by echo (Cath data: mean gradient 32.8 mmHg, peak to peak gradient 42 mmHg) Recommendations: Severe distal left main stenosis involving both the LAD and Circumflex, confirmed with IVUS imaging. Severe aortic stenosis. He will need a CT surgery consult for CABG and AVR. He has been on Plavix so I will begin holding that tonight. Repeat echo tomorrow. 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 He will also need Plavix washout before surgery. Resume IV heparin 8 hours post sheath pull. Will call CT surgery to see him tomorrow.    Cardiac Studies   Cardiac Studies & Procedures   CARDIAC CATHETERIZATION  CARDIAC CATHETERIZATION 07/04/2022  Narrative   Dist RCA lesion is 50% stenosed.   2nd Diag lesion is 25% stenosed.   1st Diag lesion is 75% stenosed.   RPDA lesion is 30% stenosed.   RPAV lesion is 60% stenosed.   Prox LAD to Mid LAD lesion is 20% stenosed.   Ost Cx to Prox Cx lesion is 90% stenosed.   Mid LM to Ost LAD lesion is 80% stenosed.   Ost LAD lesion is 80% stenosed.   Previously placed 1st Mrg stent of unknown type is  widely patent.  Severe distal left main stenosis (IVUS minimum lumen area of 4.0 mm2) Severe ostial LAD stenosis (IVUD minimum lumen area of 3.5 mm2). Patent mid LAD stent. Severe ostial Circumflex stenosis. Patent obtuse marginal stent Large dominant RCA with moderate distal stenosis, patent PDA stent, moderate posterolateral artery stenosis. Severe aortic stenosis by echo (Cath data: mean gradient 32.8 mmHg, peak to peak gradient 42 mmHg)  Recommendations: Severe distal left main  stenosis involving both  the LAD and Circumflex, confirmed with IVUS imaging. Severe aortic stenosis. He will need a CT surgery consult for CABG and AVR. He has been on Plavix so I will begin holding that tonight. Repeat echo tomorrow. 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 He will also need Plavix washout before surgery. Resume IV heparin 8 hours post sheath pull. Will call CT surgery to see him tomorrow.  Findings Coronary Findings Diagnostic  Dominance: Right  Left Main Mid LM to Ost LAD lesion is 80% stenosed.  Left Anterior Descending Ost LAD lesion is 80% stenosed. Prox LAD to Mid LAD lesion is 20% stenosed. The lesion was previously treated using a drug eluting stent over 2 years ago.  First Diagonal Branch 1st Diag lesion is 75% stenosed.  Second Diagonal Branch 2nd Diag lesion is 25% stenosed. The lesion was previously treated .  Left Circumflex Ost Cx to Prox Cx lesion is 90% stenosed.  First Obtuse Marginal Branch Previously placed 1st Mrg stent of unknown type is  widely patent.  Right Coronary Artery Dist RCA lesion is 50% stenosed.  Right Posterior Descending Artery RPDA lesion is 30% stenosed. The lesion was previously treated using a drug eluting stent over 2 years ago.  Right Posterior Atrioventricular Artery RPAV lesion is 60% stenosed.  Intervention  No interventions have been documented.   CARDIAC CATHETERIZATION  CARDIAC CATHETERIZATION 08/16/2019  Narrative  2nd Diag lesion is 80% stenosed.  Balloon angioplasty was performed using a BALLOON SAPPHIRE 2.0X12.  Post intervention, there is a 25% residual stenosis.  Prox LAD to Mid LAD lesion is 70% stenosed.  A drug-eluting stent was successfully placed using a SYNERGY XD 2.75X38, after orbital atherectomy, dilated to 3.25 mm. Stent size optimized with IVUS.  Post intervention, there is a 0% residual stenosis.  Ost Cx to Prox Cx  lesion is 50% stenosed.  Dist RCA lesion is 50% stenosed.  RPDA stent that was treated a few weeks ago was widely patent.  1st Diag lesion is 75% stenosed.  LV end diastolic pressure is normal.  There is mild aortic valve stenosis. Mild gradient noted on pullback.  Continue aggressive secondary prevention.  Would recommend clopidogrel monotherapy after 6 months of DAPT given his extensive disease.  Given the residual pain from the jailed diagonals, will watch overnight and treat with pain medication.  Findings Coronary Findings Diagnostic  Dominance: Right  Left Anterior Descending Prox LAD to Mid LAD lesion is 70% stenosed. The lesion was previously treated. IVUS would not cross  First Diagonal Branch 1st Diag lesion is 75% stenosed.  Second Diagonal Branch 2nd Diag lesion is 80% stenosed.  Left Circumflex Ost Cx to Prox Cx lesion is 50% stenosed.  Right Coronary Artery Dist RCA lesion is 50% stenosed.  Right Posterior Descending Artery Non-stenotic RPDA lesion. stent appeared underexpanded.  Vessel appeared to be 3.0 mm in diameter.  Intervention  Prox LAD to Mid LAD lesion Atherectomy CATH LAUNCHER 6FR EBU 3.75 guide catheter was inserted. WIRE VIPERWIRE COR FLEX TIP guidewire was used to cross lesion. Orbital atherectomy was performed using a CROWN DIAMONDBACK CLASSIC 1.25. Stent CATH LAUNCHER 6FR EBU 3.75 guide catheter was inserted. Lesion crossed with guidewire using a WIRE ASAHI PROWATER 180CM. Pre-stent angioplasty was performed using a BALLOON SAPPHIRE 2.5X20. A drug-eluting stent was successfully placed using a SYNERGY XD 2.75X38. Stent strut is well apposed. Post-stent angioplasty was performed using a BALLOON SAPPHIRE [place] 3.25X15. Pre  stent IVUS after athetectomy showed one focal area of concentric calcium that was treated with the 2.5 balloon.   THere was difficulty delivering the stent requiring buddy wires.Post stent IVUS showed the stent was well apposed,  but there was significant underlying calcium that affected the shape of the stent. Post-Intervention Lesion Assessment The intervention was successful. Pre-interventional TIMI flow is 3. Post-intervention TIMI flow is 3. No complications occurred at this lesion. Ultrasound (IVUS) was performed on the lesion post PCI. Stent well apposed. There is a 0% residual stenosis post intervention.  2nd Diag lesion Angioplasty CATH LAUNCHER 6FR EBU 3.75 guide catheter was inserted. WIRE HI TORQ BMW 190CM guidewire used to cross lesion. Balloon angioplasty was performed using a BALLOON SAPPHIRE 2.0X12. Maximum pressure: 8 atm. Post-Intervention Lesion Assessment The intervention was successful. Pre-interventional TIMI flow is 3. Post-intervention TIMI flow is 3. No complications occurred at this lesion. There is a 25% residual stenosis post intervention.   STRESS TESTS  MYOCARDIAL PERFUSION IMAGING 12/07/2018  Narrative  The left ventricular ejection fraction is mildly decreased (45-54%).  Nuclear stress EF: 51%.  There was no ST segment deviation noted during stress.  The study is normal.  This is a low risk study.   ECHOCARDIOGRAM  ECHOCARDIOGRAM COMPLETE 07/05/2022  Narrative ECHOCARDIOGRAM REPORT    Patient Name:   Craig Lawson Date of Exam: 07/05/2022 Medical Rec #:  [id number]     Height:       69.0 in Accession #:    [id number]    Weight:       258.0 lb Date of Birth:  June 10, 1945      BSA:          2.302 m Patient Age:    77 years      BP:           128/79 mmHg Patient Gender: M             HR:           58 bpm. Exam Location:  Inpatient  Procedure: Color Doppler, Cardiac Doppler and 2D Echo  Indications:    CAD  History:        Patient has prior history of Echocardiogram examinations. CAD, TIA, Arrythmias:LBBB and Bradycardia, Signs/Symptoms:Murmur; Risk Factors:Hypertension.  Sonographer:    Marella Chimes Referring Phys: Burnell Blanks   Sonographer Comments:  Patient is obese. IMPRESSIONS   1. The aortic valve is calcified. There is severe calcifcation of the aortic valve. There is moderate thickening of the aortic valve. Aortic valve regurgitation is mild. Moderate aortic valve stenosis. Aortic valve mean gradient measures 28.0 mmHg. Aortic valve Vmax measures 3.63 m/s. 2. Left ventricular ejection fraction, by estimation, is 60 to 65%. The left ventricle has normal function. The left ventricle has no regional wall motion abnormalities. Left ventricular diastolic parameters are consistent with Grade I diastolic dysfunction (impaired relaxation). 3. Right ventricular systolic function is normal. The right ventricular size is normal. 4. Left atrial size was mildly dilated. 5. The mitral valve is normal in structure. No evidence of mitral valve regurgitation. No evidence of mitral stenosis. 6. The inferior vena cava is normal in size with greater than 50% respiratory variability, suggesting right atrial pressure of 3 mmHg.  FINDINGS Left Ventricle: Left ventricular ejection fraction, by estimation, is 60 to 65%. The left ventricle has normal function. The left ventricle has no regional wall motion abnormalities. The left ventricular internal cavity size was normal in size. There is no left ventricular  hypertrophy. Left ventricular diastolic parameters are consistent with Grade I diastolic dysfunction (impaired relaxation).  Right Ventricle: The right ventricular size is normal. No increase in right ventricular wall thickness. Right ventricular systolic function is normal.  Left Atrium: Left atrial size was mildly dilated.  Right Atrium: Right atrial size was normal in size.  Pericardium: There is no evidence of pericardial effusion.  Mitral Valve: The mitral valve is normal in structure. Mild mitral annular calcification. No evidence of mitral valve regurgitation. No evidence of mitral valve stenosis.  Tricuspid Valve: The tricuspid valve is  normal in structure. Tricuspid valve regurgitation is not demonstrated. No evidence of tricuspid stenosis.  Aortic Valve: The aortic valve is calcified. There is severe calcifcation of the aortic valve. There is moderate thickening of the aortic valve. Aortic valve regurgitation is mild. Moderate aortic stenosis is present. Aortic valve mean gradient measures 28.0 mmHg. Aortic valve peak gradient measures 52.7 mmHg.  Pulmonic Valve: The pulmonic valve was normal in structure. Pulmonic valve regurgitation is trivial. No evidence of pulmonic stenosis.  Aorta: The aortic root is normal in size and structure.  Venous: The inferior vena cava is normal in size with greater than 50% respiratory variability, suggesting right atrial pressure of 3 mmHg.  IAS/Shunts: No atrial level shunt detected by color flow Doppler.   LEFT VENTRICLE PLAX 2D LVIDd:         6.30 cm Diastology LVIDs:         4.60 cm LV e' medial:    4.46 cm/s LV PW:         1.00 cm LV E/e' medial:  7.1 LV IVS:        1.00 cm LV e' lateral:   7.07 cm/s LV E/e' lateral: 4.5   RIGHT VENTRICLE RV S prime:     14.00 cm/s TAPSE (M-mode): 2.1 cm  LEFT ATRIUM             Index LA diam:        4.50 cm 1.95 cm/m LA Vol (A2C):   43.5 ml 18.90 ml/m LA Vol (A4C):   31.2 ml 13.55 ml/m LA Biplane Vol: 40.1 ml 17.42 ml/m AORTIC VALVE AV Vmax:           363.00 cm/s AV Vmean:          247.000 cm/s AV VTI:            0.752 m AV Peak Grad:      52.7 mmHg AV Mean Grad:      28.0 mmHg LVOT Vmax:         113.00 cm/s LVOT Vmean:        73.400 cm/s LVOT VTI:          0.216 m LVOT/AV VTI ratio: 0.29  AORTA Ao Asc diam: 3.90 cm  MITRAL VALVE MV Area (PHT): 2.16 cm     SHUNTS MV Decel Time: 352 msec     Systemic VTI: 0.22 m MV E velocity: 31.83 cm/s MV A velocity: 106.00 cm/s MV E/A ratio:  0.30  Candee Furbish MD Electronically signed by Candee Furbish MD Signature Date/Time: 07/05/2022/3:08:16 PM    Final    MONITORS  LONG  TERM MONITOR (3-14 DAYS) 10/25/2018  Narrative A ZIO monitor was applied for 3 days to assess palpitation following MI beginning 10/12/2018.  Rhythm throughout was sinus with bundle branch block minimum average and maximum heart rates of 41, 55 and 125 bpm.  There were no pauses of 3 seconds  or greater and no episodes of sinus node or AV nodal block.  There were 3 triggered and 3 diary events predominantly associated with PVCs.  Ventricular ectopy was rare with isolated PVCs.  Supraventricular ectopy was rare there were no episodes of atrial fibrillation or flutter.  There were 3 brief episodes of runs of atrial premature contractions the longest 6 complexes at a rate of 113 bpm.   Conclusion symptomatic PVCs, however PVC burden is low and the occurrence of palpitation is infrequent            Patient Profile     77 y.o. male with severe coronary artery disease, severe aortic stenosis, Jehovah's Witness  Assessment & Plan    Severe coronary artery disease Severe aortic stenosis Jehovah's Witness - No blood products - Cardiac catheterization reviewed with Dr. Angelena Form.    Surgical consultation with Dr. Lavonna Monarch reviewed.  Cell Saver.  CABG, aortic valve replacement.  Understand complexities of situation especially with no blood products.  He and his wife once again reiterated this to me.   -Off of Plavix, last dose 07/03/2022.  Will need washout. -Continue aspirin 81 -Receiving IV Lasix 40 mg-excellent output approximately 2.4 L yesterday.  Monitor basic metabolic profile.  Hyperlipidemia - On pravastatin 20 mg, Zetia 10 mg.  I will transition him over to Crestor 20 mg once a day.  Willing to try this again.  Left bundle branch block - Chronic.  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  Hypertension - Reasonable blood pressure currently.  Supposedly taking ARB at home does not know which one.  For  questions or updates, please contact [facility] Please consult [email or web address] for contact info under        Signed, Candee Furbish, MD  07/06/2022, 9:52 AM

## 2022-07-07 ENCOUNTER — Encounter (HOSPITAL_COMMUNITY): Payer: Self-pay | Admitting: Cardiovascular Disease

## 2022-07-07 DIAGNOSIS — I214 Non-ST elevation (NSTEMI) myocardial infarction: Secondary | ICD-10-CM | POA: Diagnosis not present

## 2022-07-07 LAB — BASIC METABOLIC PANEL
Anion gap: 11 (ref 5–15)
BUN: 24 mg/dL — ABNORMAL HIGH (ref 8–23)
CO2: 26 mmol/L (ref 22–32)
Calcium: 8.8 mg/dL — ABNORMAL LOW (ref 8.9–10.3)
Chloride: 98 mmol/L (ref 98–111)
Creatinine, Ser: 1.21 mg/dL (ref 0.61–1.24)
GFR, Estimated: >60 mL/min (ref >60)
Glucose, Bld: 93 mg/dL (ref 70–99)
Potassium: 3.3 mmol/L — ABNORMAL LOW (ref 3.5–5.1)
Sodium: 135 mmol/L (ref 135–145)

## 2022-07-07 LAB — CBC
HCT: 45.1 % (ref 39.0–52.0)
Hemoglobin: 16.6 g/dL (ref 13.0–17.0)
MCH: 31.9 pg (ref 26.0–34.0)
MCHC: 36.8 g/dL — ABNORMAL HIGH (ref 30.0–36.0)
MCV: 86.7 fL (ref 80.0–100.0)
Platelets: 140 10*3/uL — ABNORMAL LOW (ref 150–400)
RBC: 5.2 MIL/uL (ref 4.22–5.81)
RDW: 13.2 % (ref 11.5–15.5)
WBC: 6.7 10*3/uL (ref 4.0–10.5)
nRBC: 0 % (ref 0.0–0.2)

## 2022-07-07 LAB — HEPARIN LEVEL (UNFRACTIONATED): Heparin Unfractionated: 0.27 IU/mL — ABNORMAL LOW (ref 0.30–0.70)

## 2022-07-07 MED ORDER — FUROSEMIDE 10 MG/ML IJ SOLN
40.0000 mg | Freq: Every day | INTRAMUSCULAR | Status: DC
  Administered 2022-07-08 – 2022-07-09 (×2): 40 mg via INTRAVENOUS
  Filled 2022-07-07 (×2): qty 4

## 2022-07-07 MED ORDER — POTASSIUM CHLORIDE CRYS ER 20 MEQ PO TBCR
20.0000 meq | EXTENDED_RELEASE_TABLET | Freq: Once | ORAL | Status: AC
  Administered 2022-07-07: 20 meq via ORAL
  Filled 2022-07-07: qty 1

## 2022-07-07 NOTE — Progress Notes (Signed)
[place] for heparin  Indication: chest pain/ACS  Allergies  Allergen Reactions   Penicillins     Had rash in his 30s-40s with this as well as recurrence when re-trialed   Statins Other (See Comments)    Muscle pains; increasing with continued use.   Muscle pains; increasing with continued use.    Isosorbide Other (See Comments)    "makes me dizzy and I have headaches"   Latex Rash    Has noticed with bandages in past, leaves a sore on skin but more recently with APAP mask     Patient Measurements: Height: 5\' 9"  (175.3 cm) Weight: 114.4 kg (252 lb 3.2 oz) IBW/kg (Calculated) : 70.7 Heparin Dosing Weight: 97.2kg   Vital Signs: Temp: 97.9 F (36.6 C) (03/18 0558) Temp Source: Oral (03/18 0558) BP: 129/79 (03/18 0558) Pulse Rate: 69 (03/18 0558)  Labs: Recent Labs    07/05/22 0129 07/05/22 0939 07/06/22 0208 07/07/22 0223  HGB 15.5  --  16.5 16.6  HCT 43.3  --  44.6 45.1  PLT 141*  --  136* 140*  HEPARINUNFRC  --  0.41 0.35 0.27*  CREATININE 1.06  --   --  1.21     Estimated Creatinine Clearance: 62.8 mL/min (by C-G formula based on SCr of 1.21 mg/dL).   Assessment: 33 YOM admitted with CC of SOB and CP on exertion and started on IV heparin.  Now s/p cath and Pharmacy to resume IV heparin 8 hours post sheath removal while awaiting evaluation for CABG and AVR. Sheath removed at 1811. No bleeding nor hematoma documented.  Heparin level is subtherapeutic at 0.27, CBC remains stable.  Goal of Therapy:  Heparin level 0.3-0.7 units/ml Monitor platelets by anticoagulation protocol: Yes   Plan:  -Increase heparin to 1650 units/h -Daily heparin level, CBC   Arrie Senate, PharmD, [place], [facility] Clinical Pharmacist [phone] Please check AMION for all [facility] Pharmacy numbers 07/07/2022

## 2022-07-07 NOTE — Care Management Important Message (Signed)
Important Message  Patient Details  Name: Craig Lawson MRN: [id number] Date of Birth: [birth year]   Medicare Important Message Given:  Yes     Shelda Altes 07/07/2022, 9:25 AM

## 2022-07-07 NOTE — Progress Notes (Addendum)
Rounding Note    Patient Name: Craig Lawson Date of Encounter: 07/07/2022  [place] Cardiologist: Shirlee More, MD   Subjective   Pt found resting flat on his left side, no complaints, no chest pain  Inpatient Medications    Scheduled Meds:  aspirin EC  81 mg Oral QHS   ezetimibe  10 mg Oral QHS   ferrous sulfate  325 mg Oral Q breakfast   furosemide  40 mg Intravenous Q12H   pneumococcal 20-valent conjugate vaccine  0.5 mL Intramuscular Tomorrow-1000   rosuvastatin  20 mg Oral Daily   Continuous Infusions:  heparin 1,650 Units/hr (07/07/22 0707)   PRN Meds: acetaminophen, ondansetron (ZOFRAN) IV   Vital Signs    Vitals:   07/06/22 0450 07/06/22 0800 07/06/22 2239 07/07/22 0558  BP:  126/73 124/77 129/79  Pulse:  60 (!) 58 69  Resp:  18 20 16   Temp:  97.9 F (36.6 C) 97.9 F (36.6 C) 97.9 F (36.6 C)  TempSrc:  Oral Oral Oral  SpO2:  96% 91% 96%  Weight: 115.8 kg   114.4 kg  Height:        Intake/Output Summary (Last 24 hours) at 07/07/2022 0747 Last data filed at 07/07/2022 0100 Gross per 24 hour  Intake 732.86 ml  Output 2250 ml  Net -1517.14 ml      07/07/2022    5:58 AM 07/06/2022    4:50 AM 07/05/2022    3:09 AM  Last 3 Weights  Weight (lbs) 252 lb 3.2 oz 255 lb 4.8 oz 258 lb  Weight (kg) 114.397 kg 115.803 kg 117.028 kg      Telemetry    Sinus bradycardia in the 50s - Personally Reviewed  ECG    No new tracings - Personally Reviewed  Physical Exam   GEN: No acute distress.   Neck: No JVD Cardiac: RRR, 4/6 holosystolic murmur Respiratory: Clear to auscultation bilaterally. GI: Soft, nontender, non-distended  MS: No edema; No deformity. Neuro:  Nonfocal  Psych: Normal affect   Labs    High Sensitivity Troponin:   Recent Labs  Lab 07/03/22 1529 07/03/22 1826 07/04/22 0620  TROPONINIHS 49* 114* 86*     Chemistry Recent Labs  Lab 07/04/22 0258 07/04/22 1727 07/04/22 1729 07/05/22 0129 07/07/22 0223  NA  138   < > 139 138 135  K 3.6   < > 3.9 4.3 3.3*  CL 102  --   --  104 98  CO2 24  --   --  27 26  GLUCOSE 96  --   --  102* 93  BUN 20  --   --  17 24*  CREATININE 1.06  --   --  1.06 1.21  CALCIUM 9.1  --   --  8.6* 8.8*  PROT 6.0*  --   --   --   --   ALBUMIN 3.6  --   --   --   --   AST 26  --   --   --   --   ALT 37  --   --   --   --   ALKPHOS 21*  --   --   --   --   BILITOT 1.2  --   --   --   --   GFRNONAA >60  --   --  >60 >60  ANIONGAP 12  --   --  7 11   < > = values in  this interval not displayed.    Lipids  Recent Labs  Lab 07/04/22 0258  CHOL 184  TRIG 196*  HDL 37*  LDLCALC 108*  CHOLHDL 5.0    Hematology Recent Labs  Lab 07/05/22 0129 07/06/22 0208 07/07/22 0223  WBC 6.6 6.2 6.7  RBC 4.94 5.16 5.20  HGB 15.5 16.5 16.6  HCT 43.3 44.6 45.1  MCV 87.7 86.4 86.7  MCH 31.4 32.0 31.9  MCHC 35.8 37.0* 36.8*  RDW 13.4 13.3 13.2  PLT 141* 136* 140*   Thyroid  Recent Labs  Lab 07/04/22 0258  TSH 4.966*    BNP Recent Labs  Lab 07/03/22 1529  BNP 141.9*    DDimer No results for input(s): "DDIMER" in the last 168 hours.   Radiology    CT CHEST WO CONTRAST  Result Date: 07/05/2022 CLINICAL DATA:  Thoracic aorta disease, pre-op planning EXAM: CT CHEST WITHOUT CONTRAST TECHNIQUE: Multidetector CT imaging of the chest was performed following the standard protocol without IV contrast. RADIATION DOSE REDUCTION: This exam was performed according to the departmental dose-optimization program which includes automated exposure control, adjustment of the mA and/or kV according to patient size and/or use of iterative reconstruction technique. COMPARISON:  None Available. FINDINGS: Cardiovascular: Diffuse coronary artery calcifications. Moderate aortic calcifications. No aneurysm. Maximum aortic diameter 3.6 cm in the ascending thoracic aorta. Mediastinum/Nodes: Scattered mediastinal lymph nodes. No mediastinal, hilar, or axillary adenopathy. Trachea and esophagus  are unremarkable. Thyroid unremarkable. Lungs/Pleura: No confluent opacities or effusions. Upper Abdomen: No acute findings Musculoskeletal: Chest wall soft tissues are unremarkable. No acute bony abnormality. IMPRESSION: Diffuse coronary artery disease. No evidence of aortic aneurysm. No acute cardiopulmonary disease. Aortic Atherosclerosis (ICD10-I70.0). Electronically Signed   By: Rolm Baptise M.D.   On: 07/05/2022 21:28   ECHOCARDIOGRAM COMPLETE  Result Date: 07/05/2022    ECHOCARDIOGRAM REPORT   Patient Name:   Craig Lawson Date of Exam: 07/05/2022 Medical Rec #:  [id number]     Height:       69.0 in Accession #:    [id number]    Weight:       258.0 lb Date of Birth:  [birth year]      BSA:          2.302 m Patient Age:    77 years      BP:           128/79 mmHg Patient Gender: M             HR:           58 bpm. Exam Location:  Inpatient Procedure: Color Doppler, Cardiac Doppler and 2D Echo Indications:    CAD  History:        Patient has prior history of Echocardiogram examinations. CAD,                 TIA, Arrythmias:LBBB and Bradycardia, Signs/Symptoms:Murmur;                 Risk Factors:Hypertension.  Sonographer:    Marella Chimes Referring Phys: Burnell Blanks  Sonographer Comments: Patient is obese. IMPRESSIONS  1. The aortic valve is calcified. There is severe calcifcation of the aortic valve. There is moderate thickening of the aortic valve. Aortic valve regurgitation is mild. Moderate aortic valve stenosis. Aortic valve mean gradient measures 28.0 mmHg. Aortic valve Vmax measures 3.63 m/s.  2. Left ventricular ejection fraction, by estimation, is 60 to 65%. The left ventricle has normal function. The left ventricle has no  regional wall motion abnormalities. Left ventricular diastolic parameters are consistent with Grade I diastolic dysfunction (impaired relaxation).  3. Right ventricular systolic function is normal. The right ventricular size is normal.  4. Left atrial size was mildly  dilated.  5. The mitral valve is normal in structure. No evidence of mitral valve regurgitation. No evidence of mitral stenosis.  6. The inferior vena cava is normal in size with greater than 50% respiratory variability, suggesting right atrial pressure of 3 mmHg. FINDINGS  Left Ventricle: Left ventricular ejection fraction, by estimation, is 60 to 65%. The left ventricle has normal function. The left ventricle has no regional wall motion abnormalities. The left ventricular internal cavity size was normal in size. There is  no left ventricular hypertrophy. Left ventricular diastolic parameters are consistent with Grade I diastolic dysfunction (impaired relaxation). Right Ventricle: The right ventricular size is normal. No increase in right ventricular wall thickness. Right ventricular systolic function is normal. Left Atrium: Left atrial size was mildly dilated. Right Atrium: Right atrial size was normal in size. Pericardium: There is no evidence of pericardial effusion. Mitral Valve: The mitral valve is normal in structure. Mild mitral annular calcification. No evidence of mitral valve regurgitation. No evidence of mitral valve stenosis. Tricuspid Valve: The tricuspid valve is normal in structure. Tricuspid valve regurgitation is not demonstrated. No evidence of tricuspid stenosis. Aortic Valve: The aortic valve is calcified. There is severe calcifcation of the aortic valve. There is moderate thickening of the aortic valve. Aortic valve regurgitation is mild. Moderate aortic stenosis is present. Aortic valve mean gradient measures 28.0 mmHg. Aortic valve peak gradient measures 52.7 mmHg. Pulmonic Valve: The pulmonic valve was normal in structure. Pulmonic valve regurgitation is trivial. No evidence of pulmonic stenosis. Aorta: The aortic root is normal in size and structure. Venous: The inferior vena cava is normal in size with greater than 50% respiratory variability, suggesting right atrial pressure of 3 mmHg.  IAS/Shunts: No atrial level shunt detected by color flow Doppler.  LEFT VENTRICLE PLAX 2D LVIDd:         6.30 cm Diastology LVIDs:         4.60 cm LV e' medial:    4.46 cm/s LV PW:         1.00 cm LV E/e' medial:  7.1 LV IVS:        1.00 cm LV e' lateral:   7.07 cm/s                        LV E/e' lateral: 4.5  RIGHT VENTRICLE RV S prime:     14.00 cm/s TAPSE (M-mode): 2.1 cm LEFT ATRIUM             Index LA diam:        4.50 cm 1.95 cm/m LA Vol (A2C):   43.5 ml 18.90 ml/m LA Vol (A4C):   31.2 ml 13.55 ml/m LA Biplane Vol: 40.1 ml 17.42 ml/m  AORTIC VALVE AV Vmax:           363.00 cm/s AV Vmean:          247.000 cm/s AV VTI:            0.752 m AV Peak Grad:      52.7 mmHg AV Mean Grad:      28.0 mmHg LVOT Vmax:         113.00 cm/s LVOT Vmean:        73.400 cm/s LVOT VTI:  0.216 m LVOT/AV VTI ratio: 0.29  AORTA Ao Asc diam: 3.90 cm MITRAL VALVE MV Area (PHT): 2.16 cm     SHUNTS MV Decel Time: 352 msec     Systemic VTI: 0.22 m MV E velocity: 31.83 cm/s MV A velocity: 106.00 cm/s MV E/A ratio:  0.30 Candee Furbish MD Electronically signed by Candee Furbish MD Signature Date/Time: 07/05/2022/3:08:16 PM    Final     Cardiac Studies   Echo 07/05/22:  1. The aortic valve is calcified. There is severe calcifcation of the  aortic valve. There is moderate thickening of the aortic valve. Aortic  valve regurgitation is mild. Moderate aortic valve stenosis. Aortic valve  mean gradient measures 28.0 mmHg.  Aortic valve Vmax measures 3.63 m/s.   2. Left ventricular ejection fraction, by estimation, is 60 to 65%. The  left ventricle has normal function. The left ventricle has no regional  wall motion abnormalities. Left ventricular diastolic parameters are  consistent with Grade I diastolic  dysfunction (impaired relaxation).   3. Right ventricular systolic function is normal. The right ventricular  size is normal.   4. Left atrial size was mildly dilated.   5. The mitral valve is normal in structure. No  evidence of mitral valve  regurgitation. No evidence of mitral stenosis.   6. The inferior vena cava is normal in size with greater than 50%  respiratory variability, suggesting right atrial pressure of 3 mmHg.    LHC 07/04/22:   Dist RCA lesion is 50% stenosed.   2nd Diag lesion is 25% stenosed.   1st Diag lesion is 75% stenosed.   RPDA lesion is 30% stenosed.   RPAV lesion is 60% stenosed.   Prox LAD to Mid LAD lesion is 20% stenosed.   Ost Cx to Prox Cx lesion is 90% stenosed.   Mid LM to Ost LAD lesion is 80% stenosed.   Ost LAD lesion is 80% stenosed.   Previously placed 1st Mrg stent of unknown type is  widely patent.   Severe distal left main stenosis (IVUS minimum lumen area of 4.0 mm2) Severe ostial LAD stenosis (IVUD minimum lumen area of 3.5 mm2). Patent mid LAD stent.  Severe ostial Circumflex stenosis. Patent obtuse marginal stent Large dominant RCA with moderate distal stenosis, patent PDA stent, moderate posterolateral artery stenosis.  Severe aortic stenosis by echo (Cath data: mean gradient 32.8 mmHg, peak to peak gradient 42 mmHg)   Recommendations: Severe distal left main stenosis involving both the LAD and Circumflex, confirmed with IVUS imaging. Severe aortic stenosis. He will need a CT surgery consult for CABG and AVR. He has been on Plavix so I will begin holding that tonight. Repeat echo tomorrow. 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 He will also need Plavix washout before surgery. Resume IV heparin 8 hours post sheath pull. Will call CT surgery to see him tomorrow.    Patient Profile     77 y.o. male with severe CAD and severe AS awaiting CABG/AVR, Jehovah's witness  Assessment & Plan    CAD with prior PCI Now with distal left main disease along with ostial LAD, ostial LCX, and distal RCA disease. Felt to be a good candidate for consideration of CABG. Case complicated by refusal of blood products  (Jehovah's witness). - consult with CTS, planning for CABG after plavix washout - remains on heparin gtt and ASA - last dose of plavix was 07/03/22  Hyperlipidemia with LDL goal < 70 07/04/2022: Cholesterol 184; HDL 37; LDL Cholesterol 108; Triglycerides 196; VLDL 39 Willing to try crestor 20 mg, continue 10 mg zetia - recheck lipids in 6 weeks - may ultimately need PCSK9i if unable to tolerate or if not at goal   Severe AS Confirmed with RHC Will undergo AVR at the time of CABG - remains on IV lasix and is net negative >5 L with 2.2 L urine output yesterday - BUN starting to increase - consider reducing to 40 mg IV lasix daily   Hypokalemia K 3.3 - will administer 40 mEq potassium BMP tomorrow   OSA  intolerant to CPAP   LBBB Chronic, no syncope   Hypertension Pt reports ARB use at home, but medication is unknown   Nightly alcohol use Reported 2 drinks of wine per night      For questions or updates, please contact [place] Please consult [email or web address] for contact info under        Signed, Ledora Bottcher, PA  07/07/2022, 7:47 AM    Personally seen and examined. Agree with above.  77 year old with severe left main coronary artery disease, Jehovah's Witness awaiting bypass surgery.  Cell Saver.  No blood products.  Plavix currently on washout, date of discontinuation as above.  T CTS has seen patient, Dr. Reinaldo Berber in consultation.  Appreciate his assistance.  Continuing with aspirin Zetia changed his pravastatin to rosuvastatin 20 and we will continue with furosemide but we will reduce to 40 mg once a day IV BUN is increasing.  Candee Furbish, MD

## 2022-07-08 ENCOUNTER — Inpatient Hospital Stay (HOSPITAL_COMMUNITY): Payer: Medicare Other

## 2022-07-08 DIAGNOSIS — G459 Transient cerebral ischemic attack, unspecified: Secondary | ICD-10-CM

## 2022-07-08 DIAGNOSIS — Z01810 Encounter for preprocedural cardiovascular examination: Secondary | ICD-10-CM | POA: Diagnosis not present

## 2022-07-08 DIAGNOSIS — I214 Non-ST elevation (NSTEMI) myocardial infarction: Secondary | ICD-10-CM | POA: Diagnosis not present

## 2022-07-08 LAB — VAS US DOPPLER PRE CABG

## 2022-07-08 LAB — CBC
HCT: 44.2 % (ref 39.0–52.0)
Hemoglobin: 15.8 g/dL (ref 13.0–17.0)
MCH: 31 pg (ref 26.0–34.0)
MCHC: 35.7 g/dL (ref 30.0–36.0)
MCV: 86.7 fL (ref 80.0–100.0)
Platelets: 134 10*3/uL — ABNORMAL LOW (ref 150–400)
RBC: 5.1 MIL/uL (ref 4.22–5.81)
RDW: 13.3 % (ref 11.5–15.5)
WBC: 6.1 10*3/uL (ref 4.0–10.5)
nRBC: 0 % (ref 0.0–0.2)

## 2022-07-08 LAB — BASIC METABOLIC PANEL
Anion gap: 10 (ref 5–15)
BUN: 26 mg/dL — ABNORMAL HIGH (ref 8–23)
CO2: 25 mmol/L (ref 22–32)
Calcium: 8.9 mg/dL (ref 8.9–10.3)
Chloride: 100 mmol/L (ref 98–111)
Creatinine, Ser: 1.17 mg/dL (ref 0.61–1.24)
GFR, Estimated: >60 mL/min (ref >60)
Glucose, Bld: 98 mg/dL (ref 70–99)
Potassium: 3.4 mmol/L — ABNORMAL LOW (ref 3.5–5.1)
Sodium: 135 mmol/L (ref 135–145)

## 2022-07-08 LAB — HEPARIN LEVEL (UNFRACTIONATED): Heparin Unfractionated: 0.31 IU/mL (ref 0.30–0.70)

## 2022-07-08 MED ORDER — POTASSIUM CHLORIDE CRYS ER 20 MEQ PO TBCR
40.0000 meq | EXTENDED_RELEASE_TABLET | Freq: Once | ORAL | Status: AC
  Administered 2022-07-08: 40 meq via ORAL
  Filled 2022-07-08: qty 2

## 2022-07-08 NOTE — Progress Notes (Signed)
     [address]       [place] [address]             [phone]       Chatted with pt this am No issues Surgery Friday

## 2022-07-08 NOTE — Plan of Care (Signed)

## 2022-07-08 NOTE — H&P (View-Only) (Signed)
     [address]       [place] [address]             [phone]       Chatted with pt this am No issues Surgery Friday

## 2022-07-08 NOTE — Progress Notes (Incomplete)
Rounding Note    Patient Name: Craig Lawson Date of Encounter: 07/08/2022  [place] Cardiologist: Shirlee More, MD   Subjective   ***  Inpatient Medications    Scheduled Meds:  aspirin EC  81 mg Oral QHS   ezetimibe  10 mg Oral QHS   ferrous sulfate  325 mg Oral Q breakfast   furosemide  40 mg Intravenous Daily   pneumococcal 20-valent conjugate vaccine  0.5 mL Intramuscular Tomorrow-1000   rosuvastatin  20 mg Oral Daily   Continuous Infusions:  heparin 1,650 Units/hr (07/07/22 2138)   PRN Meds: acetaminophen, ondansetron (ZOFRAN) IV   Vital Signs    Vitals:   07/07/22 0846 07/07/22 1630 07/07/22 2233 07/08/22 0501  BP: (!) 142/75 (!) 142/67 (!) 146/76 139/74  Pulse: (!) 58 65 63 60  Resp: 18 18 18 16   Temp: 97.6 F (36.4 C) 98.1 F (36.7 C) 98.4 F (36.9 C) 97.8 F (36.6 C)  TempSrc: Oral Oral Oral Oral  SpO2: 95%  94% 95%  Weight:    115.1 kg  Height:        Intake/Output Summary (Last 24 hours) at 07/08/2022 0749 Last data filed at 07/07/2022 1000 Gross per 24 hour  Intake 139.5 ml  Output --  Net 139.5 ml      07/08/2022    5:01 AM 07/07/2022    5:58 AM 07/06/2022    4:50 AM  Last 3 Weights  Weight (lbs) 253 lb 11.2 oz 252 lb 3.2 oz 255 lb 4.8 oz  Weight (kg) 115.078 kg 114.397 kg 115.803 kg      Telemetry    *** - Personally Reviewed  ECG    *** - Personally Reviewed  Physical Exam  *** GEN: No acute distress.   Neck: No JVD Cardiac: RRR, no murmurs, rubs, or gallops.  Respiratory: Clear to auscultation bilaterally. GI: Soft, nontender, non-distended  MS: No edema; No deformity. Neuro:  Nonfocal  Psych: Normal affect   Labs    High Sensitivity Troponin:   Recent Labs  Lab 07/03/22 1529 07/03/22 1826 07/04/22 0620  TROPONINIHS 49* 114* 86*     Chemistry Recent Labs  Lab 07/04/22 0258 07/04/22 1727 07/05/22 0129 07/07/22 0223 07/08/22 0218  NA 138   < > 138 135 135  K 3.6   < > 4.3 3.3* 3.4*  CL 102   --  104 98 100  CO2 24  --  27 26 25   GLUCOSE 96  --  102* 93 98  BUN 20  --  17 24* 26*  CREATININE 1.06  --  1.06 1.21 1.17  CALCIUM 9.1  --  8.6* 8.8* 8.9  PROT 6.0*  --   --   --   --   ALBUMIN 3.6  --   --   --   --   AST 26  --   --   --   --   ALT 37  --   --   --   --   ALKPHOS 21*  --   --   --   --   BILITOT 1.2  --   --   --   --   GFRNONAA >60  --  >60 >60 >60  ANIONGAP 12  --  7 11 10    < > = values in this interval not displayed.    Lipids  Recent Labs  Lab 07/04/22 0258  CHOL 184  TRIG 196*  HDL 37*  [place] 108*  CHOLHDL 5.0    Hematology Recent Labs  Lab 07/06/22 0208 07/07/22 0223 07/08/22 0218  WBC 6.2 6.7 6.1  RBC 5.16 5.20 5.10  HGB 16.5 16.6 15.8  HCT 44.6 45.1 44.2  MCV 86.4 86.7 86.7  MCH 32.0 31.9 31.0  MCHC 37.0* 36.8* 35.7  RDW 13.3 13.2 13.3  PLT 136* 140* 134*   Thyroid  Recent Labs  Lab 07/04/22 0258  TSH 4.966*    BNP Recent Labs  Lab 07/03/22 1529  BNP 141.9*    DDimer No results for input(s): "DDIMER" in the last 168 hours.   Radiology    No results found.  Cardiac Studies   ***  Patient Profile     77 y.o. male ***  Assessment & Plan    ***  For questions or updates, please contact [place] Please consult [email or web address] for contact info under        Signed, Ledora Bottcher, PA  07/08/2022, 7:49 AM

## 2022-07-08 NOTE — Progress Notes (Signed)
Rounding Note    Patient Name: Craig Lawson Date of Encounter: 07/08/2022  [place] Cardiologist: Shirlee More, MD   Subjective   Comfortable, awaiting CABG.  IV heparin. Doing well. Sitting up eating. No CP. IV did infiltrate.   Inpatient Medications    Scheduled Meds:  aspirin EC  81 mg Oral QHS   ezetimibe  10 mg Oral QHS   ferrous sulfate  325 mg Oral Q breakfast   furosemide  40 mg Intravenous Daily   pneumococcal 20-valent conjugate vaccine  0.5 mL Intramuscular Tomorrow-1000   rosuvastatin  20 mg Oral Daily   Continuous Infusions:  heparin 1,750 Units/hr (07/08/22 0838)   PRN Meds: acetaminophen, ondansetron (ZOFRAN) IV   Vital Signs    Vitals:   07/07/22 1630 07/07/22 2233 07/08/22 0501 07/08/22 0813  BP: (!) 142/67 (!) 146/76 139/74 128/76  Pulse: 65 63 60 61  Resp: 18 18 16 18   Temp: 98.1 F (36.7 C) 98.4 F (36.9 C) 97.8 F (36.6 C) 97.8 F (36.6 C)  TempSrc: Oral Oral Oral Oral  SpO2:  94% 95% 95%  Weight:   115.1 kg   Height:        Intake/Output Summary (Last 24 hours) at 07/08/2022 0940 Last data filed at 07/08/2022 0900 Gross per 24 hour  Intake 139.5 ml  Output 1200 ml  Net -1060.5 ml      07/08/2022    5:01 AM 07/07/2022    5:58 AM 07/06/2022    4:50 AM  Last 3 Weights  Weight (lbs) 253 lb 11.2 oz 252 lb 3.2 oz 255 lb 4.8 oz  Weight (kg) 115.078 kg 114.397 kg 115.803 kg      Telemetry    Sinus bradycardia in the 50s, no significant changes- Personally Reviewed  ECG    No new tracings - Personally Reviewed  Physical Exam   GEN: No acute distress.  Overweight Neck: No JVD Cardiac: RRR, 3/6 systolic murmur Respiratory: Clear to auscultation bilaterally. GI: Soft, nontender, non-distended  MS: No edema; No deformity. Neuro:  Nonfocal  Psych: Normal affect   Labs    High Sensitivity Troponin:   Recent Labs  Lab 07/03/22 1529 07/03/22 1826 07/04/22 0620  TROPONINIHS 49* 114* 86*     Chemistry Recent  Labs  Lab 07/04/22 0258 07/04/22 1727 07/05/22 0129 07/07/22 0223 07/08/22 0218  NA 138   < > 138 135 135  K 3.6   < > 4.3 3.3* 3.4*  CL 102  --  104 98 100  CO2 24  --  27 26 25   GLUCOSE 96  --  102* 93 98  BUN 20  --  17 24* 26*  CREATININE 1.06  --  1.06 1.21 1.17  CALCIUM 9.1  --  8.6* 8.8* 8.9  PROT 6.0*  --   --   --   --   ALBUMIN 3.6  --   --   --   --   AST 26  --   --   --   --   ALT 37  --   --   --   --   ALKPHOS 21*  --   --   --   --   BILITOT 1.2  --   --   --   --   GFRNONAA >60  --  >60 >60 >60  ANIONGAP 12  --  7 11 10    < > = values in this interval not displayed.  Lipids  Recent Labs  Lab 07/04/22 0258  CHOL 184  TRIG 196*  HDL 37*  LDLCALC 108*  CHOLHDL 5.0    Hematology Recent Labs  Lab 07/06/22 0208 07/07/22 0223 07/08/22 0218  WBC 6.2 6.7 6.1  RBC 5.16 5.20 5.10  HGB 16.5 16.6 15.8  HCT 44.6 45.1 44.2  MCV 86.4 86.7 86.7  MCH 32.0 31.9 31.0  MCHC 37.0* 36.8* 35.7  RDW 13.3 13.2 13.3  PLT 136* 140* 134*   Thyroid  Recent Labs  Lab 07/04/22 0258  TSH 4.966*    BNP Recent Labs  Lab 07/03/22 1529  BNP 141.9*    DDimer No results for input(s): "DDIMER" in the last 168 hours.   Radiology    No results found.  Cardiac Studies   Echo 07/05/22:  1. The aortic valve is calcified. There is severe calcifcation of the  aortic valve. There is moderate thickening of the aortic valve. Aortic  valve regurgitation is mild. Moderate aortic valve stenosis. Aortic valve  mean gradient measures 28.0 mmHg.  Aortic valve Vmax measures 3.63 m/s.   2. Left ventricular ejection fraction, by estimation, is 60 to 65%. The  left ventricle has normal function. The left ventricle has no regional  wall motion abnormalities. Left ventricular diastolic parameters are  consistent with Grade I diastolic  dysfunction (impaired relaxation).   3. Right ventricular systolic function is normal. The right ventricular  size is normal.   4. Left atrial  size was mildly dilated.   5. The mitral valve is normal in structure. No evidence of mitral valve  regurgitation. No evidence of mitral stenosis.   6. The inferior vena cava is normal in size with greater than 50%  respiratory variability, suggesting right atrial pressure of 3 mmHg.    LHC 07/04/22:   Dist RCA lesion is 50% stenosed.   2nd Diag lesion is 25% stenosed.   1st Diag lesion is 75% stenosed.   RPDA lesion is 30% stenosed.   RPAV lesion is 60% stenosed.   Prox LAD to Mid LAD lesion is 20% stenosed.   Ost Cx to Prox Cx lesion is 90% stenosed.   Mid LM to Ost LAD lesion is 80% stenosed.   Ost LAD lesion is 80% stenosed.   Previously placed 1st Mrg stent of unknown type is  widely patent.   Severe distal left main stenosis (IVUS minimum lumen area of 4.0 mm2) Severe ostial LAD stenosis (IVUD minimum lumen area of 3.5 mm2). Patent mid LAD stent.  Severe ostial Circumflex stenosis. Patent obtuse marginal stent Large dominant RCA with moderate distal stenosis, patent PDA stent, moderate posterolateral artery stenosis.  Severe aortic stenosis by echo (Cath data: mean gradient 32.8 mmHg, peak to peak gradient 42 mmHg)   Recommendations: Severe distal left main stenosis involving both the LAD and Circumflex, confirmed with IVUS imaging. Severe aortic stenosis. He will need a CT surgery consult for CABG and AVR. He has been on Plavix so I will begin holding that tonight. Repeat echo tomorrow. 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 He will also need Plavix washout before surgery. Resume IV heparin 8 hours post sheath pull. Will call CT surgery to see him tomorrow.    Patient Profile     77 y.o. male with severe CAD and severe AS awaiting CABG/AVR, Jehovah's witness  Assessment & Plan    CAD with prior PCI,  non-ST elevation myocardial  infarction Now with distal left main disease along with ostial LAD, ostial LCX, and  distal RCA disease. CABG planned. Case complicated by refusal of blood products (Jehovah's witness).  Will utilize Cell [employer].  Understands risks. - CABG after plavix washout - remains on heparin gtt and ASA - last dose of plavix was 07/03/22   Hyperlipidemia with LDL goal < 70 07/04/2022: Cholesterol 184; HDL 37; LDL Cholesterol 108; Triglycerides 196; VLDL 39 Willing to try crestor 20 mg, continue 10 mg zetia.  Had worsening aches he states when he took statin over period of time that built up. - recheck lipids in 6 weeks - may ultimately need PCSK9i if unable to tolerate or if not at goal   Severe AS Confirmed with RHC Will undergo AVR at the time of CABG - remains on IV lasix and is net negative >5 L with 2.2 L urine output 3/17 - BUN starting to increase subtly -Continue with once daily Lasix 40 mg IV.   Hypokalemia K 3.4 - will administer 40 mEq potassium BMP tomorrow   OSA  intolerant to CPAP   LBBB Chronic, no syncope   Hypertension Pt reports ARB use at home, but medication is unknown   Nightly alcohol use Reported 2 drinks of wine per night  Awaiting    For questions or updates, please contact [place] Please consult [email or web address] for contact info under        Signed, Candee Furbish, MD  07/08/2022, 9:40 AM

## 2022-07-08 NOTE — Progress Notes (Signed)
[facility] for heparin  Indication: chest pain/ACS  Allergies  Allergen Reactions   Penicillins     Had rash in his 30s-40s with this as well as recurrence when re-trialed   Statins Other (See Comments)    Muscle pains; increasing with continued use.   Muscle pains; increasing with continued use.    Isosorbide Other (See Comments)    "makes me dizzy and I have headaches"   Latex Rash    Has noticed with bandages in past, leaves a sore on skin but more recently with APAP mask     Patient Measurements: Height: 5\' 9"  (175.3 cm) Weight: 115.1 kg (253 lb 11.2 oz) IBW/kg (Calculated) : 70.7 Heparin Dosing Weight: 97.2kg   Vital Signs: Temp: 97.8 F (36.6 C) (03/19 0501) Temp Source: Oral (03/19 0501) BP: 139/74 (03/19 0501) Pulse Rate: 60 (03/19 0501)  Labs: Recent Labs    07/06/22 0208 07/07/22 0223 07/08/22 0218  HGB 16.5 16.6 15.8  HCT 44.6 45.1 44.2  PLT 136* 140* 134*  HEPARINUNFRC 0.35 0.27* 0.31  CREATININE  --  1.21 1.17     Estimated Creatinine Clearance: 65.1 mL/min (by C-G formula based on SCr of 1.17 mg/dL).   Assessment: 59 YOM admitted with CC of SOB and CP on exertion and started on IV heparin.  Now s/p cath and awaiting  CABG and AVR.   Heparin level is therapeutic at 0.31, CBC remains stable.  Goal of Therapy:  Heparin level 0.3-0.7 units/ml Monitor platelets by anticoagulation protocol: Yes   Plan:  -Increase heparin to 1750 units/h to keep in goal -Daily heparin level, CBC  Hildred Laser, PharmD Clinical Pharmacist **Pharmacist phone directory can now be found on [email or web address] (PW [email or web address]).  Listed under [place].

## 2022-07-08 NOTE — Progress Notes (Signed)
Pre-CABG vascular studies completed.   Please see CV Procedures for preliminary results.  Jahzara Slattery, RVT  2:24 PM 07/08/22

## 2022-07-09 LAB — URINALYSIS, ROUTINE W REFLEX MICROSCOPIC
Bacteria, UA: NONE SEEN
Bilirubin Urine: NEGATIVE
Glucose, UA: NEGATIVE mg/dL
Hgb urine dipstick: NEGATIVE
Ketones, ur: NEGATIVE mg/dL
Leukocytes,Ua: NEGATIVE
Nitrite: NEGATIVE
Protein, ur: 30 mg/dL — AB
Specific Gravity, Urine: 1.02 (ref 1.005–1.030)
pH: 5 (ref 5.0–8.0)

## 2022-07-09 LAB — CBC
HCT: 45.4 % (ref 39.0–52.0)
Hemoglobin: 16.2 g/dL (ref 13.0–17.0)
MCH: 31.3 pg (ref 26.0–34.0)
MCHC: 35.7 g/dL (ref 30.0–36.0)
MCV: 87.8 fL (ref 80.0–100.0)
Platelets: 149 10*3/uL — ABNORMAL LOW (ref 150–400)
RBC: 5.17 MIL/uL (ref 4.22–5.81)
RDW: 13.4 % (ref 11.5–15.5)
WBC: 7 10*3/uL (ref 4.0–10.5)
nRBC: 0 % (ref 0.0–0.2)

## 2022-07-09 LAB — BASIC METABOLIC PANEL
Anion gap: 11 (ref 5–15)
BUN: 23 mg/dL (ref 8–23)
CO2: 23 mmol/L (ref 22–32)
Calcium: 8.8 mg/dL — ABNORMAL LOW (ref 8.9–10.3)
Chloride: 101 mmol/L (ref 98–111)
Creatinine, Ser: 1.2 mg/dL (ref 0.61–1.24)
GFR, Estimated: >60 mL/min (ref >60)
Glucose, Bld: 99 mg/dL (ref 70–99)
Potassium: 3.7 mmol/L (ref 3.5–5.1)
Sodium: 135 mmol/L (ref 135–145)

## 2022-07-09 LAB — SURGICAL PCR SCREEN
MRSA, PCR: NEGATIVE
Staphylococcus aureus: NEGATIVE

## 2022-07-09 LAB — HEPARIN LEVEL (UNFRACTIONATED): Heparin Unfractionated: 0.32 IU/mL (ref 0.30–0.70)

## 2022-07-09 MED ORDER — POTASSIUM CHLORIDE CRYS ER 20 MEQ PO TBCR
40.0000 meq | EXTENDED_RELEASE_TABLET | Freq: Once | ORAL | Status: AC
  Administered 2022-07-09: 40 meq via ORAL
  Filled 2022-07-09: qty 2

## 2022-07-09 NOTE — TOC Initial Note (Signed)
Transition of Care [facility]) - Initial/Assessment Note    Patient Details  Name: Craig Lawson MRN: [id number] Date of Birth: 12-16-1945  [facility]) CM/SW Contact:    Bethena Roys, RN Phone Number: 07/09/2022, 2:39 PM  Clinical Narrative: Patient presented for shortness of breath and chest pain. PTA patient was independent from home with spouse. Patient has PCP and gets to appointments without any issues. Case Manager will continue to follow for transition of care needs as the patient progresses.                Expected Discharge Plan: [place] Barriers to Discharge: Continued Medical Work up   Patient Goals and CMS Choice Patient states their goals for this hospitalization and ongoing recovery are:: to return home once stable.   Choice offered to / list presented to : NA      Expected Discharge Plan and Services In-house Referral: NA Discharge Planning Services: CM Consult   Living arrangements for the past 2 months: Single Family Home   Prior Living Arrangements/Services Living arrangements for the past 2 months: Single Family Home Lives with:: Spouse Patient language and need for interpreter reviewed:: Yes        Need for Family Participation in Patient Care: Yes (Comment) Care giver support system in place?: Yes (comment)   Criminal Activity/Legal Involvement Pertinent to Current Situation/Hospitalization: No - Comment as needed  Activities of Daily Living Home Assistive Devices/Equipment: None ADL Screening (condition at time of admission) Patient's cognitive ability adequate to safely complete daily activities?: Yes Is the patient deaf or have difficulty hearing?: No Does the patient have difficulty seeing, even when wearing glasses/contacts?: No Does the patient have difficulty concentrating, remembering, or making decisions?: No Patient able to express need for assistance with ADLs?: Yes Does the patient have difficulty  dressing or bathing?: No Independently performs ADLs?: Yes (appropriate for developmental age) Does the patient have difficulty walking or climbing stairs?: No Weakness of Legs: None Weakness of Arms/Hands: None  Permission Sought/Granted Permission sought to share information with : Case Manager, Family Supports   Emotional Assessment Appearance:: Appears stated age Attitude/Demeanor/Rapport: Engaged Affect (typically observed): Appropriate Orientation: : Oriented to Self, Oriented to Place, Oriented to  Time, Oriented to Situation Alcohol / Substance Use: Not Applicable Psych Involvement: No (comment)  Admission diagnosis:  NSTEMI (non-ST elevated myocardial infarction) ([place]) [I21.4] Chest pain, unspecified type [R07.9] Patient Active Problem List   Diagnosis Date Noted   Severe aortic stenosis 07/04/2022   NSTEMI (non-ST elevated myocardial infarction) ([place]) 07/03/2022   Myopathy 09/02/2019   CAD S/P percutaneous coronary angioplasty 07/28/2019   Mixed hyperlipidemia 12/04/2017   OSA on CPAP 12/02/2017   Statin intolerance 11/30/2017   Lipid disorder 11/30/2017   Non-ST elevation myocardial infarction (NSTEMI), subendocardial infarction, subsequent episode of care ([place]) 11/30/2017   History of transient ischemic attack (TIA) 10/30/2017   Coronary artery disease involving native coronary artery 10/22/2017   Morbid obesity ([place]) 10/22/2017   Mild aortic stenosis 06/01/2017   Bradycardia 06/01/2017   DOE (dyspnea on exertion) 05/04/2017   Murmur 04/29/2017   Cellulitis of left lower extremity 04/29/2017   Thrombocytopenia ([place]) 04/29/2017   Heart murmur 04/29/2017   Seborrheic keratoses 03/27/2017   Need for vaccination for Strep pneumoniae 03/27/2017   TIA (transient ischemic attack) 12/19/2016   Medicare annual wellness visit, subsequent 06/02/2016   Bilateral leg edema 08/22/2015   Kissing, osteophytes 08/22/2015   LBBB (left bundle branch block)  08/22/2015   Lumbar  facet joint pain 08/22/2015   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   Essential hypertension 08/13/2015   Mild aortic regurgitation 08/10/2015   Obesity, Class II, BMI 35-39.9 08/10/2015   Venous insufficiency 08/10/2015   Aortic stenosis, mild 08/10/2015   Pure hypercholesterolemia 05/24/2015   PCP:  Gara Kroner, DO Pharmacy:   [place], [place] [address] Phone: [phone] Fax: [phone]  [facility] ([place], [place] [facility] [address] [place] [address] Phone: [phone] Fax: [place], [address] [place] [address] Phone: [phone] Fax: [phone]  Social Determinants of Health (SDOH) Social History: [place]: No Food Insecurity (07/04/2022)  Housing: Low Risk  (07/04/2022)  Transportation Needs: No Transportation Needs (07/04/2022)  Utilities: Not At Risk (07/04/2022)  Tobacco Use: Medium Risk (07/07/2022)   Readmission Risk Interventions     No data to display

## 2022-07-09 NOTE — Progress Notes (Signed)
Mobility Specialist Progress Note:   07/09/22 1330  Mobility  Activity Ambulated independently in hallway  Level of Assistance Modified independent, requires aide device or extra time  Assistive Device Other (Comment) (IV Pole)  Distance Ambulated (ft) 500 ft  Activity Response Tolerated well  Mobility Referral Yes  $Mobility charge 1 Mobility   Pt received ambulating independently in hallway. No c/o SOB/CP throughout. HR 77bpm. Pt left sitting EOB with all needs met.   Nelta Numbers Mobility Specialist Please contact via SecureChat or  Rehab office at [phone]

## 2022-07-09 NOTE — Progress Notes (Signed)
[place] for heparin  Indication: chest pain/ACS  Allergies  Allergen Reactions   Penicillins     Had rash in his 30s-40s with this as well as recurrence when re-trialed   Statins Other (See Comments)    Muscle pains; increasing with continued use.   Muscle pains; increasing with continued use.    Isosorbide Other (See Comments)    "makes me dizzy and I have headaches"   Latex Rash    Has noticed with bandages in past, leaves a sore on skin but more recently with APAP mask     Patient Measurements: Height: 5\' 9"  (175.3 cm) Weight: 114.3 kg (252 lb) IBW/kg (Calculated) : 70.7 Heparin Dosing Weight: 97.2kg   Vital Signs: BP: 139/70 (03/20 1008) Pulse Rate: 66 (03/20 1008)  Labs: Recent Labs    07/07/22 0223 07/08/22 0218 07/09/22 0145  HGB 16.6 15.8 16.2  HCT 45.1 44.2 45.4  PLT 140* 134* 149*  HEPARINUNFRC 0.27* 0.31 0.32  CREATININE 1.21 1.17 1.20     Estimated Creatinine Clearance: 63.2 mL/min (by C-G formula based on SCr of 1.2 mg/dL).   Assessment: 37 YOM admitted with CC of SOB and CP on exertion and started on IV heparin.  Now s/p cath and awaiting  CABG and AVR.   Heparin level is therapeutic at 0.32, CBC remains stable.  Goal of Therapy:  Heparin level 0.3-0.7 units/ml Monitor platelets by anticoagulation protocol: Yes   Plan:  -Continue heparin to 1750 units/hr  -Daily heparin level, CBC  Hildred Laser, PharmD Clinical Pharmacist **Pharmacist phone directory can now be found on [email or web address] (PW [email or web address]).  Listed under [place].

## 2022-07-09 NOTE — Progress Notes (Signed)
CARDIAC REHAB PHASE I   Pre-op OHS education including OHS booklet, OHS handout, sternal precautions, mobility importance, IS use and home needs at discharge. All questions and concerns addressed. Will continue to follow.    [id number]  Vanessa Barbara, RN BSN 07/09/2022 2:11 PM

## 2022-07-09 NOTE — Progress Notes (Signed)
Rounding Note    Patient Name: Craig Lawson Date of Encounter: 07/09/2022  [place] Cardiologist: Shirlee More, MD   Subjective   Doing well, waiting for CABG  Inpatient Medications    Scheduled Meds:  aspirin EC  81 mg Oral QHS   ezetimibe  10 mg Oral QHS   ferrous sulfate  325 mg Oral Q breakfast   furosemide  40 mg Intravenous Daily   pneumococcal 20-valent conjugate vaccine  0.5 mL Intramuscular Tomorrow-1000   rosuvastatin  20 mg Oral Daily   Continuous Infusions:  heparin 1,750 Units/hr (07/09/22 0501)   PRN Meds: acetaminophen, ondansetron (ZOFRAN) IV   Vital Signs    Vitals:   07/08/22 1449 07/08/22 1659 07/08/22 2000 07/09/22 0500  BP: 132/72 136/67 133/69   Pulse: (!) 58 (!) 57 (!) 59   Resp: 18 18 18    Temp: 98.7 F (37.1 C) 97.8 F (36.6 C) 98.2 F (36.8 C)   TempSrc: Oral Oral Oral   SpO2: 96% 92% 92%   Weight:    114.3 kg  Height:        Intake/Output Summary (Last 24 hours) at 07/09/2022 0952 Last data filed at 07/08/2022 2300 Gross per 24 hour  Intake 1091.61 ml  Output --  Net 1091.61 ml      07/09/2022    5:00 AM 07/08/2022    5:01 AM 07/07/2022    5:58 AM  Last 3 Weights  Weight (lbs) 252 lb 253 lb 11.2 oz 252 lb 3.2 oz  Weight (kg) 114.306 kg 115.078 kg 114.397 kg      Telemetry    Sinus bradycardia in the 50s, no significant changes- Personally Reviewed  ECG    No new tracings - Personally Reviewed  Physical Exam   GEN: No acute distress.  Overweight Neck: No JVD Cardiac: RRR, 3/6 SM Respiratory: Clear to auscultation bilaterally. GI: Soft, nontender, non-distended  MS: No edema; No deformity. Neuro:  Nonfocal  Psych: Normal affect   Labs    High Sensitivity Troponin:   Recent Labs  Lab 07/03/22 1529 07/03/22 1826 07/04/22 0620  TROPONINIHS 49* 114* 86*     Chemistry Recent Labs  Lab 07/04/22 0258 07/04/22 1727 07/07/22 0223 07/08/22 0218 07/09/22 0145  NA 138   < > 135 135 135  K 3.6    < > 3.3* 3.4* 3.7  CL 102   < > 98 100 101  CO2 24   < > 26 25 23   GLUCOSE 96   < > 93 98 99  BUN 20   < > 24* 26* 23  CREATININE 1.06   < > 1.21 1.17 1.20  CALCIUM 9.1   < > 8.8* 8.9 8.8*  PROT 6.0*  --   --   --   --   ALBUMIN 3.6  --   --   --   --   AST 26  --   --   --   --   ALT 37  --   --   --   --   ALKPHOS 21*  --   --   --   --   BILITOT 1.2  --   --   --   --   GFRNONAA >60   < > >60 >60 >60  ANIONGAP 12   < > 11 10 11    < > = values in this interval not displayed.    Lipids  Recent Labs  Lab 07/04/22 0258  CHOL 184  TRIG 196*  HDL 37*  LDLCALC 108*  CHOLHDL 5.0    Hematology Recent Labs  Lab 07/07/22 0223 07/08/22 0218 07/09/22 0145  WBC 6.7 6.1 7.0  RBC 5.20 5.10 5.17  HGB 16.6 15.8 16.2  HCT 45.1 44.2 45.4  MCV 86.7 86.7 87.8  MCH 31.9 31.0 31.3  MCHC 36.8* 35.7 35.7  RDW 13.2 13.3 13.4  PLT 140* 134* 149*   Thyroid  Recent Labs  Lab 07/04/22 0258  TSH 4.966*    BNP Recent Labs  Lab 07/03/22 1529  BNP 141.9*    DDimer No results for input(s): "DDIMER" in the last 168 hours.   Radiology    VAS US DOPPLER PRE CABG  Result Date: 07/08/2022 PREOPERATIVE VASCULAR EVALUATION Patient Name:  Craig Lawson  Date of Exam:   07/08/2022 Medical Rec #: [id number]      Accession #:    [id number] Date of Birth: [birth year]       Patient Gender: M Patient Age:   77 years Exam Location:  [facility] Procedure:      VAS US DOPPLER PRE CABG Referring Phys: Coralie Common --------------------------------------------------------------------------------  Indications:      TIA and pre-CABG. Risk Factors:     Hyperlipidemia, coronary artery disease. Comparison Study: No prior studies. Performing Technologist: McKayla Maag RVT, [place]  Examination Guidelines: A complete evaluation includes B-mode imaging, spectral Doppler, color Doppler, and power Doppler as needed of all accessible portions of each vessel. Bilateral testing is considered an integral part of a  complete examination. Limited examinations for reoccurring indications may be performed as noted.  Right Carotid Findings: +---------+--------+-------+--------+------------------------+-----------------+          PSV cm/sEDV    StenosisDescribe                Comments                           cm/s                                                     +---------+--------+-------+--------+------------------------+-----------------+ CCA Prox 85      7                                      intimal                                                                   thickening        +---------+--------+-------+--------+------------------------+-----------------+ CCA      56      12                                     intimal           Distal  thickening        +---------+--------+-------+--------+------------------------+-----------------+ ICA Prox 72      6      1-39%   heterogenous and                                                          irregular                                 +---------+--------+-------+--------+------------------------+-----------------+ ICA Mid  52      11                                                       +---------+--------+-------+--------+------------------------+-----------------+ ICA      61      12                                                       Distal                                                                    +---------+--------+-------+--------+------------------------+-----------------+ ECA      100     10                                                       +---------+--------+-------+--------+------------------------+-----------------+ +----------+--------+-------+----------------+------------+           PSV cm/sEDV cmsDescribe        Arm Pressure +----------+--------+-------+----------------+------------+ Subclavian125             Multiphasic, WNL             +----------+--------+-------+----------------+------------+ +---------+--------+--+--------+-+---------+ VertebralPSV cm/s26EDV cm/s8Antegrade +---------+--------+--+--------+-+---------+ Left Carotid Findings: +----------+-------+--------+--------+-----------------------+-----------------+           PSV    EDV cm/sStenosisDescribe               Comments                    cm/s                                                            +----------+-------+--------+--------+-----------------------+-----------------+ CCA Prox  110    17  intimal                                                                   thickening        +----------+-------+--------+--------+-----------------------+-----------------+ CCA Distal93     14                                     intimal                                                                   thickening        +----------+-------+--------+--------+-----------------------+-----------------+ ICA Prox  33     9       1-39%   homogeneous and                                                           irregular                                +----------+-------+--------+--------+-----------------------+-----------------+ ICA Mid   60     19                                                       +----------+-------+--------+--------+-----------------------+-----------------+ ICA Distal59     16                                                       +----------+-------+--------+--------+-----------------------+-----------------+ ECA       89     12                                                       +----------+-------+--------+--------+-----------------------+-----------------+  +----------+--------+--------+----------------+------------+ SubclavianPSV cm/sEDV cm/sDescribe        Arm Pressure  +----------+--------+--------+----------------+------------+           111             Multiphasic, WNL             +----------+--------+--------+----------------+------------+ +---------+--------+--+--------+---------+ VertebralPSV cm/s24EDV cm/sAntegrade +---------+--------+--+--------+---------+  ABI Findings: +---------+------------------+-----+---------+--------+ Right    Rt Pressure (mmHg)IndexWaveform Comment  +---------+------------------+-----+---------+--------+ Brachial 129                    triphasic         +---------+------------------+-----+---------+--------+  PTA      163               1.26 triphasic         +---------+------------------+-----+---------+--------+ DP       157               1.22 triphasic         +---------+------------------+-----+---------+--------+ Great Toe123               0.95 Normal            +---------+------------------+-----+---------+--------+ +---------+------------------+-----+---------+---------------------------------+ Left     Lt Pressure (mmHg)IndexWaveform Comment                           +---------+------------------+-----+---------+---------------------------------+ Brachial                        triphasicNo pressure obtianed due to IV                                             placement.                        +---------+------------------+-----+---------+---------------------------------+ PTA      162               1.26 triphasic                                  +---------+------------------+-----+---------+---------------------------------+ DP       148               1.15 triphasic                                  +---------+------------------+-----+---------+---------------------------------+ Great Toe136               1.05 Normal                                     +---------+------------------+-----+---------+---------------------------------+  +-------+---------------+----------------+ ABI/TBIToday's ABI/TBIPrevious ABI/TBI +-------+---------------+----------------+ Right  1.26/ 0.95                      +-------+---------------+----------------+ Left   1.26/ 1.05                      +-------+---------------+----------------+  Right Doppler Findings: +--------+--------+-----+---------+--------+ Site    PressureIndexDoppler  Comments +--------+--------+-----+---------+--------+ [id number]          triphasic         +--------+--------+-----+---------+--------+ Radial               triphasic         +--------+--------+-----+---------+--------+ Ulnar                triphasic         +--------+--------+-----+---------+--------+  Left Doppler Findings: +--------+--------+-----+---------+-----------------------------------------+ Site    PressureIndexDoppler  Comments                                  +--------+--------+-----+---------+-----------------------------------------+ Brachial  triphasicNo pressure obtianed due to IV placement. +--------+--------+-----+---------+-----------------------------------------+ Radial               triphasic                                          +--------+--------+-----+---------+-----------------------------------------+ Ulnar                triphasic                                          +--------+--------+-----+---------+-----------------------------------------+   Summary: Right Carotid: Velocities in the right ICA are consistent with a 1-39% stenosis. Left Carotid: Velocities in the left ICA are consistent with a 1-39% stenosis. Vertebrals:  Bilateral vertebral arteries demonstrate antegrade flow. Subclavians: Normal flow hemodynamics were seen in bilateral subclavian              arteries. Right ABI: Resting right ankle-brachial index is within normal range. The right toe-brachial index is normal. Left ABI: Resting left ankle-brachial index is  within normal range. The left toe-brachial index is normal. Right Upper Extremity: Doppler waveforms remain within normal limits with right radial compression. Doppler waveforms decrease <50% with right ulnar compression. Left Upper Extremity: Doppler waveforms decrease <50% w left radial compression. Doppler waveforms remain within normal limits with left ulnar compression.  Electronically signed by Deitra Mayo MD on 07/08/2022 at 4:40:56 PM.    Final     Cardiac Studies   Echo 07/05/22:  1. The aortic valve is calcified. There is severe calcifcation of the  aortic valve. There is moderate thickening of the aortic valve. Aortic  valve regurgitation is mild. Moderate aortic valve stenosis. Aortic valve  mean gradient measures 28.0 mmHg.  Aortic valve Vmax measures 3.63 m/s.   2. Left ventricular ejection fraction, by estimation, is 60 to 65%. The  left ventricle has normal function. The left ventricle has no regional  wall motion abnormalities. Left ventricular diastolic parameters are  consistent with Grade I diastolic  dysfunction (impaired relaxation).   3. Right ventricular systolic function is normal. The right ventricular  size is normal.   4. Left atrial size was mildly dilated.   5. The mitral valve is normal in structure. No evidence of mitral valve  regurgitation. No evidence of mitral stenosis.   6. The inferior vena cava is normal in size with greater than 50%  respiratory variability, suggesting right atrial pressure of 3 mmHg.    LHC 07/04/22:   Dist RCA lesion is 50% stenosed.   2nd Diag lesion is 25% stenosed.   1st Diag lesion is 75% stenosed.   RPDA lesion is 30% stenosed.   RPAV lesion is 60% stenosed.   Prox LAD to Mid LAD lesion is 20% stenosed.   Ost Cx to Prox Cx lesion is 90% stenosed.   Mid LM to Ost LAD lesion is 80% stenosed.   Ost LAD lesion is 80% stenosed.   Previously placed 1st Mrg stent of unknown type is  widely patent.   Severe distal left  main stenosis (IVUS minimum lumen area of 4.0 mm2) Severe ostial LAD stenosis (IVUD minimum lumen area of 3.5 mm2). Patent mid LAD stent.  Severe ostial Circumflex stenosis. Patent obtuse marginal stent Large dominant RCA with moderate distal stenosis, patent PDA stent, moderate  posterolateral artery stenosis.  Severe aortic stenosis by echo (Cath data: mean gradient 32.8 mmHg, peak to peak gradient 42 mmHg)   Recommendations: Severe distal left main stenosis involving both the LAD and Circumflex, confirmed with IVUS imaging. Severe aortic stenosis. He will need a CT surgery consult for CABG and AVR. He has been on Plavix so I will begin holding that tonight. Repeat echo tomorrow. 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 He will also need Plavix washout before surgery. Resume IV heparin 8 hours post sheath pull. Will call CT surgery to see him tomorrow.    Patient Profile     77 y.o. male with severe CAD and severe AS awaiting CABG/AVR, Jehovah's witness  Assessment & Plan    CAD with prior PCI,  non-ST elevation myocardial infarction Now with distal left main disease along with ostial LAD, ostial LCX, and distal RCA disease. CABG planned. Case complicated by refusal of blood products (Jehovah's witness).  Will utilize [employer].  Understands risks. - CABG after plavix washout, Friday Dr. Lavonna Monarch.  - remains on heparin gtt and ASA - last dose of plavix was 07/03/22   Hyperlipidemia with LDL goal < 70 07/04/2022: Cholesterol 184; HDL 37; LDL Cholesterol 108; Triglycerides 196; VLDL 39 Willing to try crestor 20 mg, continue 10 mg zetia.  Had worsening aches he states when he took statin over period of time that built up. - recheck lipids in 6 weeks - may ultimately need PCSK9i if unable to tolerate or if not at goal   Severe AS Confirmed with RHC Will undergo AVR at the time of CABG - remains on IV lasix and is net negative >5 L  with 2.2 L urine output 3/17 - BUN starting to increase subtly -Continue with once daily Lasix 40 mg IV.   Hypokalemia K 3.7 - will administer 40 mEq potassium   OSA  intolerant to CPAP   LBBB Chronic, no syncope   Hypertension Pt reports ARB use at home, but medication is unknown   Nightly alcohol use Reported 2 drinks of wine per night  Awaiting CABG AVR Friday.     For questions or updates, please contact [place] Please consult [email or web address] for contact info under        Signed, Candee Furbish, MD  07/09/2022, 9:52 AM

## 2022-07-09 NOTE — Progress Notes (Addendum)
CCMD advised pt had a 7 bt run of svt. Pt asymptomatic

## 2022-07-10 ENCOUNTER — Inpatient Hospital Stay (HOSPITAL_COMMUNITY): Payer: Medicare Other

## 2022-07-10 LAB — CBC
HCT: 45.5 % (ref 39.0–52.0)
Hemoglobin: 16.2 g/dL (ref 13.0–17.0)
MCH: 31.2 pg (ref 26.0–34.0)
MCHC: 35.6 g/dL (ref 30.0–36.0)
MCV: 87.7 fL (ref 80.0–100.0)
Platelets: 145 10*3/uL — ABNORMAL LOW (ref 150–400)
RBC: 5.19 MIL/uL (ref 4.22–5.81)
RDW: 13.6 % (ref 11.5–15.5)
WBC: 7 10*3/uL (ref 4.0–10.5)
nRBC: 0 % (ref 0.0–0.2)

## 2022-07-10 LAB — HEPARIN LEVEL (UNFRACTIONATED): Heparin Unfractionated: 0.36 IU/mL (ref 0.30–0.70)

## 2022-07-10 LAB — BASIC METABOLIC PANEL
Anion gap: 11 (ref 5–15)
BUN: 23 mg/dL (ref 8–23)
CO2: 24 mmol/L (ref 22–32)
Calcium: 8.9 mg/dL (ref 8.9–10.3)
Chloride: 101 mmol/L (ref 98–111)
Creatinine, Ser: 1.28 mg/dL — ABNORMAL HIGH (ref 0.61–1.24)
GFR, Estimated: 57 mL/min — ABNORMAL LOW (ref >60)
Glucose, Bld: 98 mg/dL (ref 70–99)
Potassium: 3.8 mmol/L (ref 3.5–5.1)
Sodium: 136 mmol/L (ref 135–145)

## 2022-07-10 LAB — HEMOGLOBIN A1C
Hgb A1c MFr Bld: 5.3 % (ref 4.8–5.6)
Mean Plasma Glucose: 105 mg/dL

## 2022-07-10 LAB — PROTIME-INR
INR: 1.1 (ref 0.8–1.2)
Prothrombin Time: 14.3 seconds (ref 11.4–15.2)

## 2022-07-10 LAB — APTT: aPTT: 132 seconds — ABNORMAL HIGH (ref 24–36)

## 2022-07-10 MED ORDER — PLASMA-LYTE A IV SOLN
INTRAVENOUS | Status: DC
  Filled 2022-07-10: qty 2.5

## 2022-07-10 MED ORDER — LEVOFLOXACIN IN D5W 500 MG/100ML IV SOLN
500.0000 mg | INTRAVENOUS | Status: AC
  Administered 2022-07-11: 500 mg via INTRAVENOUS
  Filled 2022-07-10: qty 100

## 2022-07-10 MED ORDER — PHENYLEPHRINE HCL-NACL 20-0.9 MG/250ML-% IV SOLN
30.0000 ug/min | INTRAVENOUS | Status: AC
  Administered 2022-07-11: 15 ug/min via INTRAVENOUS
  Filled 2022-07-10: qty 250

## 2022-07-10 MED ORDER — MILRINONE LACTATE IN DEXTROSE 20-5 MG/100ML-% IV SOLN
0.3000 ug/kg/min | INTRAVENOUS | Status: DC
  Filled 2022-07-10: qty 100

## 2022-07-10 MED ORDER — INSULIN REGULAR(HUMAN) IN NACL 100-0.9 UT/100ML-% IV SOLN
INTRAVENOUS | Status: AC
  Administered 2022-07-11: .9 [IU]/h via INTRAVENOUS
  Filled 2022-07-10: qty 100

## 2022-07-10 MED ORDER — CHLORHEXIDINE GLUCONATE 0.12 % MT SOLN
15.0000 mL | Freq: Once | OROMUCOSAL | Status: AC
  Administered 2022-07-11: 15 mL via OROMUCOSAL
  Filled 2022-07-10: qty 15

## 2022-07-10 MED ORDER — TRANEXAMIC ACID (OHS) BOLUS VIA INFUSION
15.0000 mg/kg | INTRAVENOUS | Status: AC
  Administered 2022-07-11: 1708.5 mg via INTRAVENOUS
  Filled 2022-07-10: qty 1709

## 2022-07-10 MED ORDER — CHLORHEXIDINE GLUCONATE CLOTH 2 % EX PADS
6.0000 | MEDICATED_PAD | Freq: Once | CUTANEOUS | Status: AC
  Administered 2022-07-10: 6 via TOPICAL

## 2022-07-10 MED ORDER — EPINEPHRINE HCL 5 MG/250ML IV SOLN IN NS
0.0000 ug/min | INTRAVENOUS | Status: DC
  Filled 2022-07-10: qty 250

## 2022-07-10 MED ORDER — BISACODYL 5 MG PO TBEC: 5.0000 mg | DELAYED_RELEASE_TABLET | Freq: Once | ORAL | Status: DC

## 2022-07-10 MED ORDER — TRANEXAMIC ACID (OHS) PUMP PRIME SOLUTION
2.0000 mg/kg | INTRAVENOUS | Status: DC
  Filled 2022-07-10: qty 2.28

## 2022-07-10 MED ORDER — DEXMEDETOMIDINE HCL IN NACL 400 MCG/100ML IV SOLN
0.1000 ug/kg/h | INTRAVENOUS | Status: AC
  Administered 2022-07-11: .3 ug/kg/h via INTRAVENOUS
  Filled 2022-07-10: qty 100

## 2022-07-10 MED ORDER — TRANEXAMIC ACID 1000 MG/10ML IV SOLN
1.5000 mg/kg/h | INTRAVENOUS | Status: AC
  Administered 2022-07-11: 1.5 mg/kg/h via INTRAVENOUS
  Filled 2022-07-10: qty 25

## 2022-07-10 MED ORDER — HEPARIN 30,000 UNITS/1000 ML (OHS) CELLSAVER SOLUTION
Status: DC
  Filled 2022-07-10: qty 1000

## 2022-07-10 MED ORDER — POTASSIUM CHLORIDE 2 MEQ/ML IV SOLN
80.0000 meq | INTRAVENOUS | Status: DC
  Filled 2022-07-10: qty 40

## 2022-07-10 MED ORDER — VANCOMYCIN HCL 1500 MG/300ML IV SOLN
1500.0000 mg | INTRAVENOUS | Status: AC
  Administered 2022-07-11: 1500 mg via INTRAVENOUS
  Filled 2022-07-10: qty 300

## 2022-07-10 MED ORDER — MANNITOL 20 % IV SOLN
INTRAVENOUS | Status: DC
  Filled 2022-07-10: qty 13

## 2022-07-10 MED ORDER — NOREPINEPHRINE 4 MG/250ML-% IV SOLN
0.0000 ug/min | INTRAVENOUS | Status: DC
  Filled 2022-07-10: qty 250

## 2022-07-10 MED ORDER — POTASSIUM CHLORIDE CRYS ER 20 MEQ PO TBCR
40.0000 meq | EXTENDED_RELEASE_TABLET | Freq: Once | ORAL | Status: AC
  Administered 2022-07-10: 40 meq via ORAL
  Filled 2022-07-10: qty 2

## 2022-07-10 MED ORDER — TEMAZEPAM 15 MG PO CAPS: 15.0000 mg | ORAL_CAPSULE | Freq: Once | ORAL | Status: DC | PRN

## 2022-07-10 MED ORDER — VANCOMYCIN HCL 1000 MG IV SOLR
INTRAVENOUS | Status: DC
  Filled 2022-07-10 (×3): qty 20

## 2022-07-10 MED ORDER — NITROGLYCERIN IN D5W 200-5 MCG/ML-% IV SOLN
2.0000 ug/min | INTRAVENOUS | Status: DC
  Filled 2022-07-10: qty 250

## 2022-07-10 MED ORDER — METOPROLOL TARTRATE 12.5 MG HALF TABLET: 12.5000 mg | ORAL_TABLET | Freq: Once | ORAL | Status: DC

## 2022-07-10 NOTE — Progress Notes (Signed)
Rounding Note    Patient Name: Craig Lawson Date of Encounter: 07/10/2022  [place] Cardiologist: Shirlee More, MD   Subjective   Doing well, waiting for CABG, no CP. IV hep. Stopping Lasix.  Inpatient Medications    Scheduled Meds:  aspirin EC  81 mg Oral QHS   ezetimibe  10 mg Oral QHS   ferrous sulfate  325 mg Oral Q breakfast   furosemide  40 mg Intravenous Daily   pneumococcal 20-valent conjugate vaccine  0.5 mL Intramuscular Tomorrow-1000   rosuvastatin  20 mg Oral Daily   Continuous Infusions:  heparin 1,750 Units/hr (07/10/22 0324)   PRN Meds: acetaminophen, ondansetron (ZOFRAN) IV   Vital Signs    Vitals:   07/09/22 1008 07/09/22 1618 07/09/22 2048 07/10/22 0446  BP: 139/70  (!) 140/77 132/77  Pulse: 66 (!) 56 (!) 50 (!) 59  Resp: 18 16 18 18   Temp:  98.6 F (37 C) 98.4 F (36.9 C) 97.7 F (36.5 C)  TempSrc:  Oral Oral Oral  SpO2: 90%  98% 97%  Weight:    113.9 kg  Height:        Intake/Output Summary (Last 24 hours) at 07/10/2022 0820 Last data filed at 07/10/2022 0324 Gross per 24 hour  Intake 1179.81 ml  Output 1600 ml  Net -420.19 ml      07/10/2022    4:46 AM 07/09/2022    5:00 AM 07/08/2022    5:01 AM  Last 3 Weights  Weight (lbs) 251 lb 252 lb 253 lb 11.2 oz  Weight (kg) 113.853 kg 114.306 kg 115.078 kg      Telemetry    Sinus bradycardia in the 50s, stable- Personally Reviewed  ECG    No new tracings - Personally Reviewed  Physical Exam   GEN: No acute distress.  Overweight Neck: No JVD Cardiac: RRR, 3/6 SM Respiratory: Clear to auscultation bilaterally. GI: Soft, nontender, non-distended  MS: No edema; No deformity. Neuro:  Nonfocal  Psych: Normal affect   No change in exam  Labs    High Sensitivity Troponin:   Recent Labs  Lab 07/03/22 1529 07/03/22 1826 07/04/22 0620  TROPONINIHS 49* 114* 86*     Chemistry Recent Labs  Lab 07/04/22 0258 07/04/22 1727 07/08/22 0218 07/09/22 0145  07/10/22 0155  NA 138   < > 135 135 136  K 3.6   < > 3.4* 3.7 3.8  CL 102   < > 100 101 101  CO2 24   < > 25 23 24   GLUCOSE 96   < > 98 99 98  BUN 20   < > 26* 23 23  CREATININE 1.06   < > 1.17 1.20 1.28*  CALCIUM 9.1   < > 8.9 8.8* 8.9  PROT 6.0*  --   --   --   --   ALBUMIN 3.6  --   --   --   --   AST 26  --   --   --   --   ALT 37  --   --   --   --   ALKPHOS 21*  --   --   --   --   BILITOT 1.2  --   --   --   --   GFRNONAA >60   < > >60 >60 57*  ANIONGAP 12   < > 10 11 11    < > = values in this interval not displayed.  Lipids  Recent Labs  Lab 07/04/22 0258  CHOL 184  TRIG 196*  HDL 37*  LDLCALC 108*  CHOLHDL 5.0    Hematology Recent Labs  Lab 07/08/22 0218 07/09/22 0145 07/10/22 0155  WBC 6.1 7.0 7.0  RBC 5.10 5.17 5.19  HGB 15.8 16.2 16.2  HCT 44.2 45.4 45.5  MCV 86.7 87.8 87.7  MCH 31.0 31.3 31.2  MCHC 35.7 35.7 35.6  RDW 13.3 13.4 13.6  PLT 134* 149* 145*   Thyroid  Recent Labs  Lab 07/04/22 0258  TSH 4.966*    BNP Recent Labs  Lab 07/03/22 1529  BNP 141.9*    DDimer No results for input(s): "DDIMER" in the last 168 hours.   Radiology    VAS US DOPPLER PRE CABG  Result Date: 07/08/2022 PREOPERATIVE VASCULAR EVALUATION Patient Name:  Craig Lawson  Date of Exam:   07/08/2022 Medical Rec #: [id number]      Accession #:    [id number] Date of Birth: [birth year]       Patient Gender: M Patient Age:   77 years Exam Location:  [facility] Procedure:      VAS US DOPPLER PRE CABG Referring Phys: Coralie Common --------------------------------------------------------------------------------  Indications:      TIA and pre-CABG. Risk Factors:     Hyperlipidemia, coronary artery disease. Comparison Study: No prior studies. Performing Technologist: McKayla Maag RVT, [place]  Examination Guidelines: A complete evaluation includes B-mode imaging, spectral Doppler, color Doppler, and power Doppler as needed of all accessible portions of each vessel. Bilateral  testing is considered an integral part of a complete examination. Limited examinations for reoccurring indications may be performed as noted.  Right Carotid Findings: +---------+--------+-------+--------+------------------------+-----------------+          PSV cm/sEDV    StenosisDescribe                Comments                           cm/s                                                     +---------+--------+-------+--------+------------------------+-----------------+ CCA Prox 85      7                                      intimal                                                                   thickening        +---------+--------+-------+--------+------------------------+-----------------+ CCA      56      12                                     intimal           Distal  thickening        +---------+--------+-------+--------+------------------------+-----------------+ ICA Prox 72      6      1-39%   heterogenous and                                                          irregular                                 +---------+--------+-------+--------+------------------------+-----------------+ ICA Mid  52      11                                                       +---------+--------+-------+--------+------------------------+-----------------+ ICA      61      12                                                       Distal                                                                    +---------+--------+-------+--------+------------------------+-----------------+ ECA      100     10                                                       +---------+--------+-------+--------+------------------------+-----------------+ +----------+--------+-------+----------------+------------+           PSV cm/sEDV cmsDescribe        Arm Pressure  +----------+--------+-------+----------------+------------+ Subclavian125            Multiphasic, WNL             +----------+--------+-------+----------------+------------+ +---------+--------+--+--------+-+---------+ VertebralPSV cm/s26EDV cm/s8Antegrade +---------+--------+--+--------+-+---------+ Left Carotid Findings: +----------+-------+--------+--------+-----------------------+-----------------+           PSV    EDV cm/sStenosisDescribe               Comments                    cm/s                                                            +----------+-------+--------+--------+-----------------------+-----------------+ CCA Prox  110    17  intimal                                                                   thickening        +----------+-------+--------+--------+-----------------------+-----------------+ CCA Distal93     14                                     intimal                                                                   thickening        +----------+-------+--------+--------+-----------------------+-----------------+ ICA Prox  33     9       1-39%   homogeneous and                                                           irregular                                +----------+-------+--------+--------+-----------------------+-----------------+ ICA Mid   60     19                                                       +----------+-------+--------+--------+-----------------------+-----------------+ ICA Distal59     16                                                       +----------+-------+--------+--------+-----------------------+-----------------+ ECA       89     12                                                       +----------+-------+--------+--------+-----------------------+-----------------+  +----------+--------+--------+----------------+------------+  SubclavianPSV cm/sEDV cm/sDescribe        Arm Pressure +----------+--------+--------+----------------+------------+           111             Multiphasic, WNL             +----------+--------+--------+----------------+------------+ +---------+--------+--+--------+---------+ VertebralPSV cm/s24EDV cm/sAntegrade +---------+--------+--+--------+---------+  ABI Findings: +---------+------------------+-----+---------+--------+ Right    Rt Pressure (mmHg)IndexWaveform Comment  +---------+------------------+-----+---------+--------+ Brachial 129                    triphasic         +---------+------------------+-----+---------+--------+  PTA      163               1.26 triphasic         +---------+------------------+-----+---------+--------+ DP       157               1.22 triphasic         +---------+------------------+-----+---------+--------+ Great Toe123               0.95 Normal            +---------+------------------+-----+---------+--------+ +---------+------------------+-----+---------+---------------------------------+ Left     Lt Pressure (mmHg)IndexWaveform Comment                           +---------+------------------+-----+---------+---------------------------------+ Brachial                        triphasicNo pressure obtianed due to IV                                             placement.                        +---------+------------------+-----+---------+---------------------------------+ PTA      162               1.26 triphasic                                  +---------+------------------+-----+---------+---------------------------------+ DP       148               1.15 triphasic                                  +---------+------------------+-----+---------+---------------------------------+ Great Toe136               1.05 Normal                                      +---------+------------------+-----+---------+---------------------------------+ +-------+---------------+----------------+ ABI/TBIToday's ABI/TBIPrevious ABI/TBI +-------+---------------+----------------+ Right  1.26/ 0.95                      +-------+---------------+----------------+ Left   1.26/ 1.05                      +-------+---------------+----------------+  Right Doppler Findings: +--------+--------+-----+---------+--------+ Site    PressureIndexDoppler  Comments +--------+--------+-----+---------+--------+ [id number]          triphasic         +--------+--------+-----+---------+--------+ Radial               triphasic         +--------+--------+-----+---------+--------+ Ulnar                triphasic         +--------+--------+-----+---------+--------+  Left Doppler Findings: +--------+--------+-----+---------+-----------------------------------------+ Site    PressureIndexDoppler  Comments                                  +--------+--------+-----+---------+-----------------------------------------+ Brachial  triphasicNo pressure obtianed due to IV placement. +--------+--------+-----+---------+-----------------------------------------+ Radial               triphasic                                          +--------+--------+-----+---------+-----------------------------------------+ Ulnar                triphasic                                          +--------+--------+-----+---------+-----------------------------------------+   Summary: Right Carotid: Velocities in the right ICA are consistent with a 1-39% stenosis. Left Carotid: Velocities in the left ICA are consistent with a 1-39% stenosis. Vertebrals:  Bilateral vertebral arteries demonstrate antegrade flow. Subclavians: Normal flow hemodynamics were seen in bilateral subclavian              arteries. Right ABI: Resting right ankle-brachial index is within normal range. The  right toe-brachial index is normal. Left ABI: Resting left ankle-brachial index is within normal range. The left toe-brachial index is normal. Right Upper Extremity: Doppler waveforms remain within normal limits with right radial compression. Doppler waveforms decrease <50% with right ulnar compression. Left Upper Extremity: Doppler waveforms decrease <50% w left radial compression. Doppler waveforms remain within normal limits with left ulnar compression.  Electronically signed by Deitra Mayo MD on 07/08/2022 at 4:40:56 PM.    Final     Cardiac Studies   Echo 07/05/22:  1. The aortic valve is calcified. There is severe calcifcation of the  aortic valve. There is moderate thickening of the aortic valve. Aortic  valve regurgitation is mild. Moderate aortic valve stenosis. Aortic valve  mean gradient measures 28.0 mmHg.  Aortic valve Vmax measures 3.63 m/s.   2. Left ventricular ejection fraction, by estimation, is 60 to 65%. The  left ventricle has normal function. The left ventricle has no regional  wall motion abnormalities. Left ventricular diastolic parameters are  consistent with Grade I diastolic  dysfunction (impaired relaxation).   3. Right ventricular systolic function is normal. The right ventricular  size is normal.   4. Left atrial size was mildly dilated.   5. The mitral valve is normal in structure. No evidence of mitral valve  regurgitation. No evidence of mitral stenosis.   6. The inferior vena cava is normal in size with greater than 50%  respiratory variability, suggesting right atrial pressure of 3 mmHg.    LHC 07/04/22:   Dist RCA lesion is 50% stenosed.   2nd Diag lesion is 25% stenosed.   1st Diag lesion is 75% stenosed.   RPDA lesion is 30% stenosed.   RPAV lesion is 60% stenosed.   Prox LAD to Mid LAD lesion is 20% stenosed.   Ost Cx to Prox Cx lesion is 90% stenosed.   Mid LM to Ost LAD lesion is 80% stenosed.   Ost LAD lesion is 80% stenosed.    Previously placed 1st Mrg stent of unknown type is  widely patent.   Severe distal left main stenosis (IVUS minimum lumen area of 4.0 mm2) Severe ostial LAD stenosis (IVUD minimum lumen area of 3.5 mm2). Patent mid LAD stent.  Severe ostial Circumflex stenosis. Patent obtuse marginal stent Large dominant RCA with moderate distal stenosis, patent PDA stent, moderate  posterolateral artery stenosis.  Severe aortic stenosis by echo (Cath data: mean gradient 32.8 mmHg, peak to peak gradient 42 mmHg)   Recommendations: Severe distal left main stenosis involving both the LAD and Circumflex, confirmed with IVUS imaging. Severe aortic stenosis. He will need a CT surgery consult for CABG and AVR. He has been on Plavix so I will begin holding that tonight. Repeat echo tomorrow. 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 He will also need Plavix washout before surgery. Resume IV heparin 8 hours post sheath pull. Will call CT surgery to see him tomorrow.    Patient Profile     77 y.o. male with severe CAD and severe AS awaiting CABG/AVR, Jehovah's witness  Assessment & Plan    CAD with prior PCI,  non-ST elevation myocardial infarction Now with distal left main disease along with ostial LAD, ostial LCX, and distal RCA disease. CABG planned. Case complicated by refusal of blood products (Jehovah's witness).  Will utilize Cell Saver.  Understands risks. - CABG after plavix washout, Friday, tomorrow with Dr. Lavonna Monarch.  - remains on heparin gtt and ASA - last dose of plavix was 07/03/22   Hyperlipidemia with LDL goal < 70 07/04/2022: Cholesterol 184; HDL 37; LDL Cholesterol 108; Triglycerides 196; VLDL 39 Willing to try crestor 20 mg, continue 10 mg zetia.  Had worsening aches he states when he took statin over period of time that built up. - recheck lipids in 6 weeks - may ultimately need PCSK9i if unable to tolerate or if not at goal   Severe  AS Confirmed with R[facility] Will undergo AVR at the time of CABG - remains on IV lasix and is net negative >5 L with 2.2 L urine output 3/17 - Creat starting to increase subtly, 1.2 to 1.28 -DC Lasix 40 mg IV.   Hypokalemia K 3.8 - will administer 40 mEq potassium   OSA  intolerant to CPAP   LBBB Chronic, no syncope   Hypertension Pt reports ARB use at home, but medication is unknown   Nightly alcohol use Reported 2 drinks of wine per night  Awaiting CABG AVR Friday.     For questions or updates, please contact [place] Please consult [email or web address] for contact info under        Signed, Candee Furbish, MD  07/10/2022, 8:20 AM

## 2022-07-10 NOTE — Progress Notes (Signed)
CARDIAC REHAB PHASE I    Pt just finishing breakfast, feeling well this morning. Pt ambulating well independently, reports tolerating well. Reviewed pre-op [facility] education provided yesterday. All questions and concerns addressed. Will continue to follow.   [id number]  Vanessa Barbara, RN BSN 07/10/2022 9:47 AM

## 2022-07-10 NOTE — Plan of Care (Signed)
  Problem: Education: Goal: Knowledge of General Education information will improve Description: Including pain rating scale, medication(s)/side effects and non-pharmacologic comfort measures Outcome: Progressing   Problem: Health Behavior/Discharge Planning: Goal: Ability to manage health-related needs will improve Outcome: Progressing   Problem: Safety: Goal: Ability to remain free from injury will improve Outcome: Progressing   Problem: Skin Integrity: Goal: Risk for impaired skin integrity will decrease Outcome: Progressing   Problem: Clinical Measurements: Goal: Cardiovascular complication will be avoided Outcome: Progressing

## 2022-07-10 NOTE — Progress Notes (Signed)
Mobility Specialist Progress Note:   07/10/22 1045  Mobility  Activity Ambulated independently in hallway  Level of Assistance Modified independent, requires aide device or extra time  Assistive Device Other (Comment) (IV Pole)  Distance Ambulated (ft) 500 ft  Activity Response Tolerated well  Mobility Referral Yes  $Mobility charge 1 Mobility   Pt agreeable to mobility session. Ambulated independently with IV Pole. HR 60s-70s throughout. Pt back in bed with all needs met.   Nelta Numbers Mobility Specialist Please contact via SecureChat or  Rehab office at [phone]

## 2022-07-10 NOTE — Progress Notes (Signed)
[place] for heparin  Indication: chest pain/ACS  Allergies  Allergen Reactions   Penicillins     Had rash in his 30s-40s with this as well as recurrence when re-trialed   Statins Other (See Comments)    Muscle pains; increasing with continued use.   Muscle pains; increasing with continued use.    Isosorbide Other (See Comments)    "makes me dizzy and I have headaches"   Latex Rash    Has noticed with bandages in past, leaves a sore on skin but more recently with APAP mask     Patient Measurements: Height: 5\' 9"  (175.3 cm) Weight: 113.9 kg (251 lb) IBW/kg (Calculated) : 70.7 Heparin Dosing Weight: 97.2kg   Vital Signs: Temp: 97.7 F (36.5 C) (03/21 0446) Temp Source: Oral (03/21 0446) BP: 132/77 (03/21 0446) Pulse Rate: 59 (03/21 0446)  Labs: Recent Labs    07/08/22 0218 07/09/22 0145 07/10/22 0155  HGB 15.8 16.2 16.2  HCT 44.2 45.4 45.5  PLT 134* 149* 145*  APTT  --   --  132*  LABPROT  --   --  14.3  INR  --   --  1.1  HEPARINUNFRC 0.31 0.32 0.36  CREATININE 1.17 1.20 1.28*     Estimated Creatinine Clearance: 59.2 mL/min (A) (by C-G formula based on SCr of 1.28 mg/dL (H)).   Assessment: 56 YOM admitted with CC of SOB and CP on exertion and started on IV heparin.  Now s/p cath and awaiting CABG and AVR on 3/22  Heparin level is therapeutic at 0.36, CBC remains stable.  Goal of Therapy:  Heparin level 0.3-0.7 units/ml Monitor platelets by anticoagulation protocol: Yes   Plan:  -Continue heparin to 1750 units/hr  -Daily heparin level, CBC  Hildred Laser, PharmD Clinical Pharmacist **Pharmacist phone directory can now be found on [email or web address] ([email or web address]).  Listed under [place].

## 2022-07-11 ENCOUNTER — Inpatient Hospital Stay (HOSPITAL_COMMUNITY): Payer: Medicare Other | Admitting: Certified Registered Nurse Anesthetist

## 2022-07-11 ENCOUNTER — Inpatient Hospital Stay (HOSPITAL_COMMUNITY): Payer: Medicare Other

## 2022-07-11 ENCOUNTER — Other Ambulatory Visit: Payer: Self-pay

## 2022-07-11 ENCOUNTER — Inpatient Hospital Stay (HOSPITAL_COMMUNITY)
Admission: EM | Disposition: A | Discharge status: Home / Self Care | Payer: Self-pay | Source: Home / Self Care | Attending: Thoracic Surgery (Cardiothoracic Vascular Surgery)

## 2022-07-11 ENCOUNTER — Encounter (HOSPITAL_COMMUNITY): Payer: Self-pay | Admitting: Cardiology

## 2022-07-11 DIAGNOSIS — I252 Old myocardial infarction: Secondary | ICD-10-CM

## 2022-07-11 DIAGNOSIS — I4891 Unspecified atrial fibrillation: Secondary | ICD-10-CM

## 2022-07-11 DIAGNOSIS — I350 Nonrheumatic aortic (valve) stenosis: Secondary | ICD-10-CM

## 2022-07-11 DIAGNOSIS — Z951 Presence of aortocoronary bypass graft: Secondary | ICD-10-CM

## 2022-07-11 DIAGNOSIS — I214 Non-ST elevation (NSTEMI) myocardial infarction: Secondary | ICD-10-CM | POA: Diagnosis not present

## 2022-07-11 DIAGNOSIS — I10 Essential (primary) hypertension: Secondary | ICD-10-CM

## 2022-07-11 DIAGNOSIS — I2510 Atherosclerotic heart disease of native coronary artery without angina pectoris: Secondary | ICD-10-CM

## 2022-07-11 DIAGNOSIS — Z87891 Personal history of nicotine dependence: Secondary | ICD-10-CM

## 2022-07-11 DIAGNOSIS — I360 Nonrheumatic tricuspid (valve) stenosis: Secondary | ICD-10-CM | POA: Diagnosis not present

## 2022-07-11 HISTORY — PX: TEE WITHOUT CARDIOVERSION: SHX5443

## 2022-07-11 HISTORY — PX: CORONARY ARTERY BYPASS GRAFT: SHX141

## 2022-07-11 HISTORY — PX: AORTIC VALVE REPLACEMENT: SHX41

## 2022-07-11 HISTORY — DX: Presence of aortocoronary bypass graft: Z95.1

## 2022-07-11 HISTORY — DX: Unspecified atrial fibrillation: I48.91

## 2022-07-11 LAB — POCT I-STAT 7, (LYTES, BLD GAS, ICA,H+H)
Acid-Base Excess: 0 mmol/L (ref 0.0–2.0)
Acid-Base Excess: 0 mmol/L (ref 0.0–2.0)
Acid-Base Excess: 1 mmol/L (ref 0.0–2.0)
Acid-base deficit: 1 mmol/L (ref 0.0–2.0)
Acid-base deficit: 1 mmol/L (ref 0.0–2.0)
Acid-base deficit: 1 mmol/L (ref 0.0–2.0)
Acid-base deficit: 1 mmol/L (ref 0.0–2.0)
Acid-base deficit: 7 mmol/L — ABNORMAL HIGH (ref 0.0–2.0)
Acid-base deficit: 6 mmol/L — ABNORMAL HIGH (ref 0.0–2.0)
Acid-base deficit: 3 mmol/L — ABNORMAL HIGH (ref 0.0–2.0)
Bicarbonate: 27.7 mmol/L (ref 20.0–28.0)
Bicarbonate: 24 mmol/L (ref 20.0–28.0)
Bicarbonate: 25.1 mmol/L (ref 20.0–28.0)
Bicarbonate: 25.3 mmol/L (ref 20.0–28.0)
Bicarbonate: 25.4 mmol/L (ref 20.0–28.0)
Bicarbonate: 23.9 mmol/L (ref 20.0–28.0)
Bicarbonate: 25.2 mmol/L (ref 20.0–28.0)
Bicarbonate: 18.6 mmol/L — ABNORMAL LOW (ref 20.0–28.0)
Bicarbonate: 19.6 mmol/L — ABNORMAL LOW (ref 20.0–28.0)
Bicarbonate: 23 mmol/L (ref 20.0–28.0)
Calcium, Ion: 1.28 mmol/L (ref 1.15–1.40)
Calcium, Ion: 0.97 mmol/L — ABNORMAL LOW (ref 1.15–1.40)
Calcium, Ion: 1.08 mmol/L — ABNORMAL LOW (ref 1.15–1.40)
Calcium, Ion: 1.13 mmol/L — ABNORMAL LOW (ref 1.15–1.40)
Calcium, Ion: 1.14 mmol/L — ABNORMAL LOW (ref 1.15–1.40)
Calcium, Ion: 1.12 mmol/L — ABNORMAL LOW (ref 1.15–1.40)
Calcium, Ion: 1.15 mmol/L (ref 1.15–1.40)
Calcium, Ion: 1.13 mmol/L — ABNORMAL LOW (ref 1.15–1.40)
Calcium, Ion: 1.1 mmol/L — ABNORMAL LOW (ref 1.15–1.40)
Calcium, Ion: 1.08 mmol/L — ABNORMAL LOW (ref 1.15–1.40)
HCT: 42 % (ref 39.0–52.0)
HCT: 32 % — ABNORMAL LOW (ref 39.0–52.0)
HCT: 33 % — ABNORMAL LOW (ref 39.0–52.0)
HCT: 34 % — ABNORMAL LOW (ref 39.0–52.0)
HCT: 35 % — ABNORMAL LOW (ref 39.0–52.0)
HCT: 33 % — ABNORMAL LOW (ref 39.0–52.0)
HCT: 40 % (ref 39.0–52.0)
HCT: 42 % (ref 39.0–52.0)
HCT: 40 % (ref 39.0–52.0)
HCT: 38 % — ABNORMAL LOW (ref 39.0–52.0)
Hemoglobin: 14.3 g/dL (ref 13.0–17.0)
Hemoglobin: 10.9 g/dL — ABNORMAL LOW (ref 13.0–17.0)
Hemoglobin: 11.2 g/dL — ABNORMAL LOW (ref 13.0–17.0)
Hemoglobin: 11.6 g/dL — ABNORMAL LOW (ref 13.0–17.0)
Hemoglobin: 11.9 g/dL — ABNORMAL LOW (ref 13.0–17.0)
Hemoglobin: 11.2 g/dL — ABNORMAL LOW (ref 13.0–17.0)
Hemoglobin: 13.6 g/dL (ref 13.0–17.0)
Hemoglobin: 14.3 g/dL (ref 13.0–17.0)
Hemoglobin: 13.6 g/dL (ref 13.0–17.0)
Hemoglobin: 12.9 g/dL — ABNORMAL LOW (ref 13.0–17.0)
O2 Saturation: 99 %
O2 Saturation: 100 %
O2 Saturation: 100 %
O2 Saturation: 100 %
O2 Saturation: 100 %
O2 Saturation: 100 %
O2 Saturation: 97 %
O2 Saturation: 98 %
O2 Saturation: 98 %
O2 Saturation: 96 %
Patient temperature: 37
Patient temperature: 38
Patient temperature: 38.5
Patient temperature: 37.9
Potassium: 4.1 mmol/L (ref 3.5–5.1)
Potassium: 4.6 mmol/L (ref 3.5–5.1)
Potassium: 5.3 mmol/L — ABNORMAL HIGH (ref 3.5–5.1)
Potassium: 5.2 mmol/L — ABNORMAL HIGH (ref 3.5–5.1)
Potassium: 5.6 mmol/L — ABNORMAL HIGH (ref 3.5–5.1)
Potassium: 5.2 mmol/L — ABNORMAL HIGH (ref 3.5–5.1)
Potassium: 5.5 mmol/L — ABNORMAL HIGH (ref 3.5–5.1)
Potassium: 5.6 mmol/L — ABNORMAL HIGH (ref 3.5–5.1)
Potassium: 5.2 mmol/L — ABNORMAL HIGH (ref 3.5–5.1)
Potassium: 4.5 mmol/L (ref 3.5–5.1)
Sodium: 138 mmol/L (ref 135–145)
Sodium: 134 mmol/L — ABNORMAL LOW (ref 135–145)
Sodium: 136 mmol/L (ref 135–145)
Sodium: 134 mmol/L — ABNORMAL LOW (ref 135–145)
Sodium: 135 mmol/L (ref 135–145)
Sodium: 135 mmol/L (ref 135–145)
Sodium: 135 mmol/L (ref 135–145)
Sodium: 136 mmol/L (ref 135–145)
Sodium: 136 mmol/L (ref 135–145)
Sodium: 139 mmol/L (ref 135–145)
TCO2: 29 mmol/L (ref 22–32)
TCO2: 25 mmol/L (ref 22–32)
TCO2: 26 mmol/L (ref 22–32)
TCO2: 27 mmol/L (ref 22–32)
TCO2: 27 mmol/L (ref 22–32)
TCO2: 25 mmol/L (ref 22–32)
TCO2: 27 mmol/L (ref 22–32)
TCO2: 20 mmol/L — ABNORMAL LOW (ref 22–32)
TCO2: 21 mmol/L — ABNORMAL LOW (ref 22–32)
TCO2: 24 mmol/L (ref 22–32)
pCO2 arterial: 57.9 mmHg — ABNORMAL HIGH (ref 32–48)
pCO2 arterial: 36.1 mmHg (ref 32–48)
pCO2 arterial: 37.4 mmHg (ref 32–48)
pCO2 arterial: 46.2 mmHg (ref 32–48)
pCO2 arterial: 48.7 mmHg — ABNORMAL HIGH (ref 32–48)
pCO2 arterial: 40.6 mmHg (ref 32–48)
pCO2 arterial: 48.6 mmHg — ABNORMAL HIGH (ref 32–48)
pCO2 arterial: 40.2 mmHg (ref 32–48)
pCO2 arterial: 39 mmHg (ref 32–48)
pCO2 arterial: 43.1 mmHg (ref 32–48)
pH, Arterial: 7.288 — ABNORMAL LOW (ref 7.35–7.45)
pH, Arterial: 7.43 (ref 7.35–7.45)
pH, Arterial: 7.435 (ref 7.35–7.45)
pH, Arterial: 7.325 — ABNORMAL LOW (ref 7.35–7.45)
pH, Arterial: 7.347 — ABNORMAL LOW (ref 7.35–7.45)
pH, Arterial: 7.377 (ref 7.35–7.45)
pH, Arterial: 7.323 — ABNORMAL LOW (ref 7.35–7.45)
pH, Arterial: 7.277 — ABNORMAL LOW (ref 7.35–7.45)
pH, Arterial: 7.316 — ABNORMAL LOW (ref 7.35–7.45)
pH, Arterial: 7.338 — ABNORMAL LOW (ref 7.35–7.45)
pO2, Arterial: 185 mmHg — ABNORMAL HIGH (ref 83–108)
pO2, Arterial: 298 mmHg — ABNORMAL HIGH (ref 83–108)
pO2, Arterial: 326 mmHg — ABNORMAL HIGH (ref 83–108)
pO2, Arterial: 303 mmHg — ABNORMAL HIGH (ref 83–108)
pO2, Arterial: 310 mmHg — ABNORMAL HIGH (ref 83–108)
pO2, Arterial: 293 mmHg — ABNORMAL HIGH (ref 83–108)
pO2, Arterial: 97 mmHg (ref 83–108)
pO2, Arterial: 114 mmHg — ABNORMAL HIGH (ref 83–108)
pO2, Arterial: 119 mmHg — ABNORMAL HIGH (ref 83–108)
pO2, Arterial: 95 mmHg (ref 83–108)

## 2022-07-11 LAB — POCT I-STAT, CHEM 8
BUN: 20 mg/dL (ref 8–23)
BUN: 20 mg/dL (ref 8–23)
BUN: 19 mg/dL (ref 8–23)
BUN: 21 mg/dL (ref 8–23)
BUN: 19 mg/dL (ref 8–23)
Calcium, Ion: 1.29 mmol/L (ref 1.15–1.40)
Calcium, Ion: 1.21 mmol/L (ref 1.15–1.40)
Calcium, Ion: 1.08 mmol/L — ABNORMAL LOW (ref 1.15–1.40)
Calcium, Ion: 1.15 mmol/L (ref 1.15–1.40)
Calcium, Ion: 1.11 mmol/L — ABNORMAL LOW (ref 1.15–1.40)
Chloride: 100 mmol/L (ref 98–111)
Chloride: 101 mmol/L (ref 98–111)
Chloride: 100 mmol/L (ref 98–111)
Chloride: 100 mmol/L (ref 98–111)
Chloride: 101 mmol/L (ref 98–111)
Creatinine, Ser: 1.1 mg/dL (ref 0.61–1.24)
Creatinine, Ser: 1 mg/dL (ref 0.61–1.24)
Creatinine, Ser: 1 mg/dL (ref 0.61–1.24)
Creatinine, Ser: 1 mg/dL (ref 0.61–1.24)
Creatinine, Ser: 1 mg/dL (ref 0.61–1.24)
Glucose, Bld: 103 mg/dL — ABNORMAL HIGH (ref 70–99)
Glucose, Bld: 117 mg/dL — ABNORMAL HIGH (ref 70–99)
Glucose, Bld: 110 mg/dL — ABNORMAL HIGH (ref 70–99)
Glucose, Bld: 138 mg/dL — ABNORMAL HIGH (ref 70–99)
Glucose, Bld: 138 mg/dL — ABNORMAL HIGH (ref 70–99)
HCT: 44 % (ref 39.0–52.0)
HCT: 39 % (ref 39.0–52.0)
HCT: 33 % — ABNORMAL LOW (ref 39.0–52.0)
HCT: 35 % — ABNORMAL LOW (ref 39.0–52.0)
HCT: 35 % — ABNORMAL LOW (ref 39.0–52.0)
Hemoglobin: 15 g/dL (ref 13.0–17.0)
Hemoglobin: 13.3 g/dL (ref 13.0–17.0)
Hemoglobin: 11.2 g/dL — ABNORMAL LOW (ref 13.0–17.0)
Hemoglobin: 11.9 g/dL — ABNORMAL LOW (ref 13.0–17.0)
Hemoglobin: 11.9 g/dL — ABNORMAL LOW (ref 13.0–17.0)
Potassium: 4.1 mmol/L (ref 3.5–5.1)
Potassium: 4.3 mmol/L (ref 3.5–5.1)
Potassium: 5.3 mmol/L — ABNORMAL HIGH (ref 3.5–5.1)
Potassium: 5.6 mmol/L — ABNORMAL HIGH (ref 3.5–5.1)
Potassium: 5.2 mmol/L — ABNORMAL HIGH (ref 3.5–5.1)
Sodium: 139 mmol/L (ref 135–145)
Sodium: 137 mmol/L (ref 135–145)
Sodium: 135 mmol/L (ref 135–145)
Sodium: 135 mmol/L (ref 135–145)
Sodium: 135 mmol/L (ref 135–145)
TCO2: 27 mmol/L (ref 22–32)
TCO2: 25 mmol/L (ref 22–32)
TCO2: 26 mmol/L (ref 22–32)
TCO2: 26 mmol/L (ref 22–32)
TCO2: 25 mmol/L (ref 22–32)

## 2022-07-11 LAB — CBC
HCT: 44.2 % (ref 39.0–52.0)
HCT: 41.8 % (ref 39.0–52.0)
HCT: 41.7 % (ref 39.0–52.0)
Hemoglobin: 16.1 g/dL (ref 13.0–17.0)
Hemoglobin: 15.2 g/dL (ref 13.0–17.0)
Hemoglobin: 15.2 g/dL (ref 13.0–17.0)
MCH: 31.7 pg (ref 26.0–34.0)
MCH: 31.9 pg (ref 26.0–34.0)
MCH: 31.9 pg (ref 26.0–34.0)
MCHC: 36.4 g/dL — ABNORMAL HIGH (ref 30.0–36.0)
MCHC: 36.4 g/dL — ABNORMAL HIGH (ref 30.0–36.0)
MCHC: 36.5 g/dL — ABNORMAL HIGH (ref 30.0–36.0)
MCV: 87 fL (ref 80.0–100.0)
MCV: 87.8 fL (ref 80.0–100.0)
MCV: 87.4 fL (ref 80.0–100.0)
Platelets: 143 10*3/uL — ABNORMAL LOW (ref 150–400)
Platelets: 120 10*3/uL — ABNORMAL LOW (ref 150–400)
Platelets: 182 10*3/uL (ref 150–400)
RBC: 5.08 MIL/uL (ref 4.22–5.81)
RBC: 4.76 MIL/uL (ref 4.22–5.81)
RBC: 4.77 MIL/uL (ref 4.22–5.81)
RDW: 13.6 % (ref 11.5–15.5)
RDW: 13.7 % (ref 11.5–15.5)
RDW: 14 % (ref 11.5–15.5)
WBC: 6.8 10*3/uL (ref 4.0–10.5)
WBC: 15.4 10*3/uL — ABNORMAL HIGH (ref 4.0–10.5)
WBC: 21.8 10*3/uL — ABNORMAL HIGH (ref 4.0–10.5)
nRBC: 0 % (ref 0.0–0.2)
nRBC: 0 % (ref 0.0–0.2)
nRBC: 0.1 % (ref 0.0–0.2)

## 2022-07-11 LAB — BASIC METABOLIC PANEL
Anion gap: 8 (ref 5–15)
Anion gap: 9 (ref 5–15)
BUN: 19 mg/dL (ref 8–23)
BUN: 18 mg/dL (ref 8–23)
CO2: 24 mmol/L (ref 22–32)
CO2: 21 mmol/L — ABNORMAL LOW (ref 22–32)
Calcium: 8.9 mg/dL (ref 8.9–10.3)
Calcium: 7.4 mg/dL — ABNORMAL LOW (ref 8.9–10.3)
Chloride: 103 mmol/L (ref 98–111)
Chloride: 105 mmol/L (ref 98–111)
Creatinine, Ser: 1.1 mg/dL (ref 0.61–1.24)
Creatinine, Ser: 1.1 mg/dL (ref 0.61–1.24)
GFR, Estimated: >60 mL/min (ref >60)
GFR, Estimated: >60 mL/min (ref >60)
Glucose, Bld: 94 mg/dL (ref 70–99)
Glucose, Bld: 125 mg/dL — ABNORMAL HIGH (ref 70–99)
Potassium: 4 mmol/L (ref 3.5–5.1)
Potassium: 5.4 mmol/L — ABNORMAL HIGH (ref 3.5–5.1)
Sodium: 135 mmol/L (ref 135–145)
Sodium: 135 mmol/L (ref 135–145)

## 2022-07-11 LAB — PLATELET COUNT: Platelets: 144 10*3/uL — ABNORMAL LOW (ref 150–400)

## 2022-07-11 LAB — PROTIME-INR
INR: 1.3 — ABNORMAL HIGH (ref 0.8–1.2)
Prothrombin Time: 16.1 seconds — ABNORMAL HIGH (ref 11.4–15.2)

## 2022-07-11 LAB — HEPARIN LEVEL (UNFRACTIONATED): Heparin Unfractionated: 0.48 IU/mL (ref 0.30–0.70)

## 2022-07-11 LAB — HEMOGLOBIN AND HEMATOCRIT, BLOOD
HCT: 35 % — ABNORMAL LOW (ref 39.0–52.0)
Hemoglobin: 12.9 g/dL — ABNORMAL LOW (ref 13.0–17.0)

## 2022-07-11 LAB — SARS CORONAVIRUS 2 (TAT 6-24 HRS): SARS Coronavirus 2: NEGATIVE

## 2022-07-11 LAB — GLUCOSE, CAPILLARY
Glucose-Capillary: 122 mg/dL — ABNORMAL HIGH (ref 70–99)
Glucose-Capillary: 131 mg/dL — ABNORMAL HIGH (ref 70–99)
Glucose-Capillary: 131 mg/dL — ABNORMAL HIGH (ref 70–99)
Glucose-Capillary: 135 mg/dL — ABNORMAL HIGH (ref 70–99)
Glucose-Capillary: 137 mg/dL — ABNORMAL HIGH (ref 70–99)
Glucose-Capillary: 140 mg/dL — ABNORMAL HIGH (ref 70–99)
Glucose-Capillary: 142 mg/dL — ABNORMAL HIGH (ref 70–99)
Glucose-Capillary: 156 mg/dL — ABNORMAL HIGH (ref 70–99)

## 2022-07-11 LAB — COOXEMETRY PANEL
Carboxyhemoglobin: 1.9 % — ABNORMAL HIGH (ref 0.5–1.5)
Methemoglobin: <0.7 % (ref 0.0–1.5)
O2 Saturation: 54 %
Total hemoglobin: 15.5 g/dL (ref 12.0–16.0)

## 2022-07-11 LAB — POCT I-STAT EG7
Acid-Base Excess: 1 mmol/L (ref 0.0–2.0)
Bicarbonate: 26.5 mmol/L (ref 20.0–28.0)
Calcium, Ion: 1.08 mmol/L — ABNORMAL LOW (ref 1.15–1.40)
HCT: 33 % — ABNORMAL LOW (ref 39.0–52.0)
Hemoglobin: 11.2 g/dL — ABNORMAL LOW (ref 13.0–17.0)
O2 Saturation: 72 %
Potassium: 4.1 mmol/L (ref 3.5–5.1)
Sodium: 138 mmol/L (ref 135–145)
TCO2: 28 mmol/L (ref 22–32)
pCO2, Ven: 45.2 mmHg (ref 44–60)
pH, Ven: 7.376 (ref 7.25–7.43)
pO2, Ven: 39 mmHg (ref 32–45)

## 2022-07-11 LAB — APTT: aPTT: 41 seconds — ABNORMAL HIGH (ref 24–36)

## 2022-07-11 LAB — MAGNESIUM: Magnesium: 3.2 mg/dL — ABNORMAL HIGH (ref 1.7–2.4)

## 2022-07-11 SURGERY — CORONARY ARTERY BYPASS GRAFTING (CABG)
Anesthesia: General | Site: Chest

## 2022-07-11 MED ORDER — FENTANYL CITRATE (PF) 250 MCG/5ML IJ SOLN
INTRAMUSCULAR | Status: AC
  Filled 2022-07-11: qty 5

## 2022-07-11 MED ORDER — HEPARIN SODIUM (PORCINE) 1000 UNIT/ML IJ SOLN
INTRAMUSCULAR | Status: AC
  Filled 2022-07-11: qty 10

## 2022-07-11 MED ORDER — INSULIN REGULAR(HUMAN) IN NACL 100-0.9 UT/100ML-% IV SOLN: INTRAVENOUS | Status: DC

## 2022-07-11 MED ORDER — LACTATED RINGERS IV BOLUS
500.0000 mL | Freq: Once | INTRAVENOUS | Status: AC
  Administered 2022-07-11: 500 mL via INTRAVENOUS

## 2022-07-11 MED ORDER — ACETAMINOPHEN 160 MG/5ML PO SOLN: 650.0000 mg | Freq: Once | ORAL | Status: AC

## 2022-07-11 MED ORDER — METOPROLOL TARTRATE 5 MG/5ML IV SOLN: 2.5000 mg | INTRAVENOUS | Status: DC | PRN

## 2022-07-11 MED ORDER — PLASMA-LYTE A IV SOLN
INTRAVENOUS | Status: DC | PRN
  Administered 2022-07-11: 500 mL via INTRAVASCULAR

## 2022-07-11 MED ORDER — SODIUM CHLORIDE 0.9% FLUSH: 3.0000 mL | INTRAVENOUS | Status: DC | PRN

## 2022-07-11 MED ORDER — DOCUSATE SODIUM 100 MG PO CAPS
200.0000 mg | ORAL_CAPSULE | Freq: Every day | ORAL | Status: DC
  Administered 2022-07-12 – 2022-07-22 (×9): 200 mg via ORAL
  Filled 2022-07-11 (×10): qty 2

## 2022-07-11 MED ORDER — METOPROLOL TARTRATE 25 MG/10 ML ORAL SUSPENSION: 12.5000 mg | Freq: Two times a day (BID) | ORAL | Status: DC

## 2022-07-11 MED ORDER — ROCURONIUM BROMIDE 10 MG/ML (PF) SYRINGE
PREFILLED_SYRINGE | INTRAVENOUS | Status: DC | PRN
  Administered 2022-07-11 (×2): 50 mg via INTRAVENOUS
  Administered 2022-07-11: 20 mg via INTRAVENOUS
  Administered 2022-07-11: 50 mg via INTRAVENOUS
  Administered 2022-07-11: 10 mg via INTRAVENOUS
  Administered 2022-07-11: 90 mg via INTRAVENOUS

## 2022-07-11 MED ORDER — ONDANSETRON HCL 4 MG/2ML IJ SOLN: 4.0000 mg | Freq: Four times a day (QID) | INTRAMUSCULAR | Status: DC | PRN

## 2022-07-11 MED ORDER — EPHEDRINE SULFATE-NACL 50-0.9 MG/10ML-% IV SOSY
PREFILLED_SYRINGE | INTRAVENOUS | Status: DC | PRN
  Administered 2022-07-11: 5 mg via INTRAVENOUS

## 2022-07-11 MED ORDER — PAPAVERINE HCL 30 MG/ML IJ SOLN
INTRAMUSCULAR | Status: AC
  Filled 2022-07-11: qty 2

## 2022-07-11 MED ORDER — PANTOPRAZOLE SODIUM 40 MG PO TBEC
40.0000 mg | DELAYED_RELEASE_TABLET | Freq: Every day | ORAL | Status: DC
  Administered 2022-07-13 – 2022-07-22 (×10): 40 mg via ORAL
  Filled 2022-07-11 (×10): qty 1

## 2022-07-11 MED ORDER — LACTATED RINGERS IV SOLN: INTRAVENOUS | Status: DC | PRN

## 2022-07-11 MED ORDER — LEVOFLOXACIN IN D5W 750 MG/150ML IV SOLN
750.0000 mg | INTRAVENOUS | Status: AC
  Administered 2022-07-12: 750 mg via INTRAVENOUS
  Filled 2022-07-11: qty 150

## 2022-07-11 MED ORDER — SODIUM CHLORIDE 0.9% FLUSH
3.0000 mL | Freq: Two times a day (BID) | INTRAVENOUS | Status: DC
  Administered 2022-07-12 – 2022-07-21 (×18): 3 mL via INTRAVENOUS

## 2022-07-11 MED ORDER — PHENYLEPHRINE 80 MCG/ML (10ML) SYRINGE FOR IV PUSH (FOR BLOOD PRESSURE SUPPORT)
PREFILLED_SYRINGE | INTRAVENOUS | Status: AC
  Filled 2022-07-11: qty 10

## 2022-07-11 MED ORDER — DEXMEDETOMIDINE HCL IN NACL 400 MCG/100ML IV SOLN
0.0000 ug/kg/h | INTRAVENOUS | Status: DC
  Administered 2022-07-11: 0.5 ug/kg/h via INTRAVENOUS
  Filled 2022-07-11: qty 100

## 2022-07-11 MED ORDER — PROPOFOL 10 MG/ML IV BOLUS
INTRAVENOUS | Status: AC
  Filled 2022-07-11: qty 20

## 2022-07-11 MED ORDER — PROPOFOL 10 MG/ML IV BOLUS
INTRAVENOUS | Status: DC | PRN
  Administered 2022-07-11: 40 mg via INTRAVENOUS
  Administered 2022-07-11: 20 mg via INTRAVENOUS
  Administered 2022-07-11: 30 mg via INTRAVENOUS
  Administered 2022-07-11 (×2): 40 mg via INTRAVENOUS
  Administered 2022-07-11: 30 mg via INTRAVENOUS

## 2022-07-11 MED ORDER — VANCOMYCIN HCL IN DEXTROSE 1-5 GM/200ML-% IV SOLN
1000.0000 mg | Freq: Once | INTRAVENOUS | Status: AC
  Administered 2022-07-11: 1000 mg via INTRAVENOUS
  Filled 2022-07-11: qty 200

## 2022-07-11 MED ORDER — DEXTROSE 50 % IV SOLN: 0.0000 mL | INTRAVENOUS | Status: DC | PRN

## 2022-07-11 MED ORDER — LACTATED RINGERS IV SOLN: INTRAVENOUS | Status: DC

## 2022-07-11 MED ORDER — SODIUM CHLORIDE 0.45 % IV SOLN: INTRAVENOUS | Status: DC | PRN

## 2022-07-11 MED ORDER — ACETAMINOPHEN 650 MG RE SUPP
650.0000 mg | Freq: Once | RECTAL | Status: AC
  Administered 2022-07-11: 650 mg via RECTAL

## 2022-07-11 MED ORDER — 0.9 % SODIUM CHLORIDE (POUR BTL) OPTIME
TOPICAL | Status: DC | PRN
  Administered 2022-07-11: 1000 mL
  Administered 2022-07-11: 5000 mL

## 2022-07-11 MED ORDER — SODIUM BICARBONATE 8.4 % IV SOLN
100.0000 meq | Freq: Once | INTRAVENOUS | Status: AC
  Administered 2022-07-11: 100 meq via INTRAVENOUS

## 2022-07-11 MED ORDER — MIDAZOLAM HCL 2 MG/2ML IJ SOLN: 2.0000 mg | INTRAMUSCULAR | Status: DC | PRN

## 2022-07-11 MED ORDER — ROCURONIUM BROMIDE 10 MG/ML (PF) SYRINGE
PREFILLED_SYRINGE | INTRAVENOUS | Status: AC
  Filled 2022-07-11: qty 20

## 2022-07-11 MED ORDER — PHENYLEPHRINE HCL-NACL 20-0.9 MG/250ML-% IV SOLN
0.0000 ug/min | INTRAVENOUS | Status: DC
  Filled 2022-07-11: qty 250

## 2022-07-11 MED ORDER — POTASSIUM CHLORIDE 10 MEQ/50ML IV SOLN: 10.0000 meq | INTRAVENOUS | Status: AC

## 2022-07-11 MED ORDER — HEPARIN SODIUM (PORCINE) 1000 UNIT/ML IJ SOLN
INTRAMUSCULAR | Status: DC | PRN
  Administered 2022-07-11: 35000 [IU] via INTRAVENOUS

## 2022-07-11 MED ORDER — MIDAZOLAM HCL (PF) 10 MG/2ML IJ SOLN
INTRAMUSCULAR | Status: AC
  Filled 2022-07-11: qty 2

## 2022-07-11 MED ORDER — SODIUM BICARBONATE 8.4 % IV SOLN
INTRAVENOUS | Status: AC
  Administered 2022-07-11: 100 meq via INTRAVENOUS
  Filled 2022-07-11: qty 100

## 2022-07-11 MED ORDER — ACETAMINOPHEN 500 MG PO TABS
1000.0000 mg | ORAL_TABLET | Freq: Four times a day (QID) | ORAL | Status: AC
  Administered 2022-07-12 – 2022-07-16 (×18): 1000 mg via ORAL
  Filled 2022-07-11 (×16): qty 2

## 2022-07-11 MED ORDER — NITROGLYCERIN IN D5W 200-5 MCG/ML-% IV SOLN: 0.0000 ug/min | INTRAVENOUS | Status: DC

## 2022-07-11 MED ORDER — METOPROLOL TARTRATE 12.5 MG HALF TABLET: 12.5000 mg | ORAL_TABLET | Freq: Two times a day (BID) | ORAL | Status: DC

## 2022-07-11 MED ORDER — ETOMIDATE 2 MG/ML IV SOLN
INTRAVENOUS | Status: AC
  Filled 2022-07-11: qty 10

## 2022-07-11 MED ORDER — BISACODYL 5 MG PO TBEC
10.0000 mg | DELAYED_RELEASE_TABLET | Freq: Every day | ORAL | Status: DC
  Administered 2022-07-12 – 2022-07-22 (×9): 10 mg via ORAL
  Filled 2022-07-11 (×10): qty 2

## 2022-07-11 MED ORDER — ASPIRIN 81 MG PO CHEW: 324.0000 mg | CHEWABLE_TABLET | Freq: Every day | ORAL | Status: DC

## 2022-07-11 MED ORDER — HEPARIN SODIUM (PORCINE) 1000 UNIT/ML IJ SOLN
INTRAMUSCULAR | Status: AC
  Filled 2022-07-11: qty 1

## 2022-07-11 MED ORDER — PHENYLEPHRINE 80 MCG/ML (10ML) SYRINGE FOR IV PUSH (FOR BLOOD PRESSURE SUPPORT)
PREFILLED_SYRINGE | INTRAVENOUS | Status: DC | PRN
  Administered 2022-07-11 (×8): 80 ug via INTRAVENOUS

## 2022-07-11 MED ORDER — PROTAMINE SULFATE 10 MG/ML IV SOLN
INTRAVENOUS | Status: DC | PRN
  Administered 2022-07-11: 10 mg via INTRAVENOUS
  Administered 2022-07-11: 340 mg via INTRAVENOUS

## 2022-07-11 MED ORDER — PAPAVERINE HCL 30 MG/ML IJ SOLN
INTRAMUSCULAR | Status: DC | PRN
  Administered 2022-07-11: 60 mg via INTRAVENOUS

## 2022-07-11 MED ORDER — MIDAZOLAM HCL (PF) 5 MG/ML IJ SOLN
INTRAMUSCULAR | Status: DC | PRN
  Administered 2022-07-11: 1 mg via INTRAVENOUS
  Administered 2022-07-11: 2 mg via INTRAVENOUS
  Administered 2022-07-11 (×3): 1 mg via INTRAVENOUS
  Administered 2022-07-11 (×2): 2 mg via INTRAVENOUS

## 2022-07-11 MED ORDER — FENTANYL CITRATE (PF) 250 MCG/5ML IJ SOLN
INTRAMUSCULAR | Status: DC | PRN
  Administered 2022-07-11 (×2): 250 ug via INTRAVENOUS
  Administered 2022-07-11: 50 ug via INTRAVENOUS
  Administered 2022-07-11: 150 ug via INTRAVENOUS
  Administered 2022-07-11: 200 ug via INTRAVENOUS
  Administered 2022-07-11: 150 ug via INTRAVENOUS
  Administered 2022-07-11: 200 ug via INTRAVENOUS

## 2022-07-11 MED ORDER — SODIUM CHLORIDE 0.9 % IV SOLN
250.0000 mL | INTRAVENOUS | Status: DC
  Administered 2022-07-12: 250 mL via INTRAVENOUS

## 2022-07-11 MED ORDER — BISACODYL 10 MG RE SUPP: 10.0000 mg | Freq: Every day | RECTAL | Status: DC

## 2022-07-11 MED ORDER — LACTATED RINGERS IV SOLN
500.0000 mL | Freq: Once | INTRAVENOUS | Status: AC | PRN
  Administered 2022-07-11: 500 mL via INTRAVENOUS

## 2022-07-11 MED ORDER — SODIUM CHLORIDE 0.9 % IV SOLN: INTRAVENOUS | Status: DC | PRN

## 2022-07-11 MED ORDER — SODIUM CHLORIDE 0.9 % IV SOLN: INTRAVENOUS | Status: DC

## 2022-07-11 MED ORDER — ASPIRIN 325 MG PO TBEC
325.0000 mg | DELAYED_RELEASE_TABLET | Freq: Every day | ORAL | Status: DC
  Administered 2022-07-12 – 2022-07-21 (×10): 325 mg via ORAL
  Filled 2022-07-11 (×10): qty 1

## 2022-07-11 MED ORDER — SODIUM ZIRCONIUM CYCLOSILICATE 5 G PO PACK
5.0000 g | PACK | Freq: Once | ORAL | Status: AC
  Administered 2022-07-11: 5 g
  Filled 2022-07-11: qty 1

## 2022-07-11 MED ORDER — MORPHINE SULFATE (PF) 2 MG/ML IV SOLN
1.0000 mg | INTRAVENOUS | Status: DC | PRN
  Administered 2022-07-12: 2 mg via INTRAVENOUS
  Filled 2022-07-11: qty 1

## 2022-07-11 MED ORDER — MAGNESIUM SULFATE 4 GM/100ML IV SOLN
4.0000 g | Freq: Once | INTRAVENOUS | Status: AC
  Administered 2022-07-11: 4 g via INTRAVENOUS
  Filled 2022-07-11: qty 100

## 2022-07-11 MED ORDER — OXYCODONE HCL 5 MG PO TABS
5.0000 mg | ORAL_TABLET | ORAL | Status: DC | PRN
  Administered 2022-07-11: 10 mg via ORAL
  Administered 2022-07-13 – 2022-07-21 (×6): 5 mg via ORAL
  Filled 2022-07-11 (×6): qty 1
  Filled 2022-07-11: qty 2
  Filled 2022-07-11: qty 1

## 2022-07-11 MED ORDER — ROCURONIUM BROMIDE 10 MG/ML (PF) SYRINGE
PREFILLED_SYRINGE | INTRAVENOUS | Status: AC
  Filled 2022-07-11: qty 10

## 2022-07-11 MED ORDER — ALBUMIN HUMAN 5 % IV SOLN: 250.0000 mL | INTRAVENOUS | Status: DC | PRN

## 2022-07-11 MED ORDER — SODIUM CHLORIDE 0.9 % IV BOLUS
500.0000 mL | Freq: Once | INTRAVENOUS | Status: AC
  Administered 2022-07-11: 500 mL via INTRAVENOUS

## 2022-07-11 MED ORDER — NOREPINEPHRINE 4 MG/250ML-% IV SOLN
0.0000 ug/min | INTRAVENOUS | Status: DC
  Administered 2022-07-11: 4 ug/min via INTRAVENOUS
  Administered 2022-07-12: 6 ug/min via INTRAVENOUS
  Filled 2022-07-11: qty 250

## 2022-07-11 MED ORDER — CHLORHEXIDINE GLUCONATE CLOTH 2 % EX PADS
6.0000 | MEDICATED_PAD | Freq: Every day | CUTANEOUS | Status: DC
  Administered 2022-07-12 – 2022-07-21 (×11): 6 via TOPICAL

## 2022-07-11 MED ORDER — SODIUM BICARBONATE 8.4 % IV SOLN: 100.0000 meq | Freq: Once | INTRAVENOUS | Status: AC

## 2022-07-11 MED ORDER — ACETAMINOPHEN 160 MG/5ML PO SOLN: 1000.0000 mg | Freq: Four times a day (QID) | ORAL | Status: AC

## 2022-07-11 MED ORDER — VANCOMYCIN HCL 1000 MG IV SOLR
INTRAVENOUS | Status: DC | PRN
  Administered 2022-07-11: 1000 mL

## 2022-07-11 MED ORDER — TRAMADOL HCL 50 MG PO TABS
50.0000 mg | ORAL_TABLET | ORAL | Status: DC | PRN
  Administered 2022-07-11 – 2022-07-12 (×2): 100 mg via ORAL
  Filled 2022-07-11 (×2): qty 2

## 2022-07-11 MED ORDER — CHLORHEXIDINE GLUCONATE 0.12 % MT SOLN
15.0000 mL | OROMUCOSAL | Status: AC
  Administered 2022-07-11: 15 mL via OROMUCOSAL
  Filled 2022-07-11: qty 15

## 2022-07-11 MED ORDER — FAMOTIDINE IN NACL 20-0.9 MG/50ML-% IV SOLN
20.0000 mg | Freq: Two times a day (BID) | INTRAVENOUS | Status: DC
  Administered 2022-07-11: 20 mg via INTRAVENOUS
  Filled 2022-07-11: qty 50

## 2022-07-11 SURGICAL SUPPLY — 109 items
ADAPTER CARDIO PERF ANTE/RETRO (ADAPTER) ×2 IMPLANT
ADH SKN CLS LQ APL DERMABOND (GAUZE/BANDAGES/DRESSINGS) ×2
ADPR PRFSN 84XANTGRD RTRGD (ADAPTER) ×2
ANTIFOG SOL W/FOAM PAD STRL (MISCELLANEOUS) ×2
BAG DECANTER FOR FLEXI CONT (MISCELLANEOUS) ×2 IMPLANT
BLADE MICRO SHARP 3 15 DEG (BLADE) ×2 IMPLANT
BLADE STERNUM SYSTEM 6 (BLADE) ×2 IMPLANT
BLADE SURG 11 STRL SS (BLADE) IMPLANT
BLADE SURG 15 STRL LF DISP TIS (BLADE) IMPLANT
BLADE SURG 15 STRL SS (BLADE) ×2
BNDG CMPR MED 10X6 ELC LF (GAUZE/BANDAGES/DRESSINGS) ×2
BNDG ELASTIC 4X5.8 VLCR STR LF (GAUZE/BANDAGES/DRESSINGS) ×2 IMPLANT
BNDG ELASTIC 6X10 VLCR STRL LF (GAUZE/BANDAGES/DRESSINGS) IMPLANT
BNDG ELASTIC 6X5.8 VLCR STR LF (GAUZE/BANDAGES/DRESSINGS) ×2 IMPLANT
BNDG GAUZE DERMACEA FLUFF 4 (GAUZE/BANDAGES/DRESSINGS) ×2 IMPLANT
BNDG GZE DERMACEA 4 6PLY (GAUZE/BANDAGES/DRESSINGS) ×2
BOOT SUTURE VASCULAR YLW (MISCELLANEOUS) ×2
CANISTER SUCT 3000ML PPV (MISCELLANEOUS) ×2 IMPLANT
CANNULA MC2 2 STG 36/46 CONN (CANNULA) IMPLANT
CANNULA NON VENT 22FR 12 (CANNULA) IMPLANT
CATH FOLEY LATEX FREE 16FR (CATHETERS) ×2
CATH FOLEY LF 16FR (CATHETERS) IMPLANT
CATH HEART VENT LEFT (CATHETERS) ×2 IMPLANT
CATH RETROPLEGIA CORONARY 14FR (CATHETERS) ×2 IMPLANT
CATH ROBINSON RED A/P 18FR (CATHETERS) ×6 IMPLANT
CATH THOR STR 32F SOFT 20 RADI (CATHETERS) ×4 IMPLANT
CATH THORACIC 28FR RT ANG (CATHETERS) ×2 IMPLANT
CLAMP SUTURE YELLOW 5 PAIRS (MISCELLANEOUS) ×2 IMPLANT
CLIP TI LARGE 6 (CLIP) ×2 IMPLANT
CLIP TI MEDIUM 24 (CLIP) IMPLANT
CLIP TI WIDE RED SMALL 24 (CLIP) IMPLANT
CONN Y 3/8X3/8X3/8  BEN (MISCELLANEOUS) ×4
CONN Y 3/8X3/8X3/8 BEN (MISCELLANEOUS) IMPLANT
DERMABOND ADVANCED .7 DNX6 (GAUZE/BANDAGES/DRESSINGS) IMPLANT
DEV SUMP PERICARDIAL 20F 15IN (MISCELLANEOUS) IMPLANT
DEVICE SUT CK QUICK LOAD MINI (Prosthesis & Implant Heart) IMPLANT
DRAPE CV SPLIT W-CLR ANES SCRN (DRAPES) ×2 IMPLANT
DRAPE INCISE IOBAN 66X45 STRL (DRAPES) ×2 IMPLANT
DRAPE PERI GROIN 82X75IN TIB (DRAPES) ×2 IMPLANT
DRSG AQUACEL AG ADV 3.5X10 (GAUZE/BANDAGES/DRESSINGS) ×2 IMPLANT
DRSG AQUACEL AG ADV 3.5X14 (GAUZE/BANDAGES/DRESSINGS) IMPLANT
ELECT BLADE 4.0 EZ CLEAN MEGAD (MISCELLANEOUS) ×2
ELECT CAUTERY BLADE 6.4 (BLADE) ×2 IMPLANT
ELECT REM PT RETURN 9FT ADLT (ELECTROSURGICAL) ×4
ELECTRODE BLDE 4.0 EZ CLN MEGD (MISCELLANEOUS) ×2 IMPLANT
ELECTRODE REM PT RTRN 9FT ADLT (ELECTROSURGICAL) ×4 IMPLANT
FELT TEFLON 1X6 (MISCELLANEOUS) ×4 IMPLANT
GAUZE 4X4 16PLY ~~LOC~~+RFID DBL (SPONGE) IMPLANT
GAUZE SPONGE 4X4 12PLY STRL (GAUZE/BANDAGES/DRESSINGS) ×4 IMPLANT
GLOVE BIOGEL PI IND STRL 7.0 (GLOVE) IMPLANT
GLOVE BIOGEL PI IND STRL 7.5 (GLOVE) IMPLANT
GLOVE SURG SS PI 6.0 STRL IVOR (GLOVE) IMPLANT
GLOVE SURG SS PI 6.5 STRL IVOR (GLOVE) IMPLANT
GLOVE SURG SS PI 7.5 STRL IVOR (GLOVE) IMPLANT
GOWN STRL REUS W/ TWL LRG LVL3 (GOWN DISPOSABLE) ×12 IMPLANT
GOWN STRL REUS W/TWL LRG LVL3 (GOWN DISPOSABLE) ×12
HEMOSTAT SURGICEL 2X14 (HEMOSTASIS) IMPLANT
INSERT FOGARTY 61MM (MISCELLANEOUS) IMPLANT
INSERT FOGARTY XLG (MISCELLANEOUS) ×2 IMPLANT
KIT BASIN OR (CUSTOM PROCEDURE TRAY) ×2 IMPLANT
KIT SUCTION CATH 14FR (SUCTIONS) ×2 IMPLANT
KIT SUT CK MINI COMBO 4X17 (Prosthesis & Implant Heart) IMPLANT
KIT TURNOVER KIT B (KITS) ×2 IMPLANT
KIT VASOVIEW HEMOPRO 2 VH 4000 (KITS) ×2 IMPLANT
LINE VENT (MISCELLANEOUS) IMPLANT
MARKER DISTAL GRAFT W/ HOLDER (MISCELLANEOUS) ×4 IMPLANT
NS IRRIG 1000ML POUR BTL (IV SOLUTION) ×10 IMPLANT
ORGANIZER SUTURE GABBAY-FRATER (MISCELLANEOUS) ×2 IMPLANT
PACK E OPEN HEART (SUTURE) ×2 IMPLANT
PACK OPEN HEART (CUSTOM PROCEDURE TRAY) ×2 IMPLANT
PAD ARMBOARD 7.5X6 YLW CONV (MISCELLANEOUS) ×4 IMPLANT
PAD ELECT DEFIB RADIOL ZOLL (MISCELLANEOUS) ×2 IMPLANT
PENCIL BUTTON HOLSTER BLD 10FT (ELECTRODE) ×2 IMPLANT
POSITIONER HEAD DONUT 9IN (MISCELLANEOUS) ×2 IMPLANT
PUNCH AORTIC ROTATE 4.0MM (MISCELLANEOUS) ×2 IMPLANT
PUNCH AORTIC ROTATE 4.5MM 8IN (MISCELLANEOUS) ×2 IMPLANT
SET MPS 3-ND DEL (MISCELLANEOUS) IMPLANT
SOLUTION ANTFG W/FOAM PAD STRL (MISCELLANEOUS) IMPLANT
SPONGE T-LAP 18X18 ~~LOC~~+RFID (SPONGE) IMPLANT
SUPPORT HEART JANKE-BARRON (MISCELLANEOUS) ×2 IMPLANT
SUT BONE WAX W31G (SUTURE) ×2 IMPLANT
SUT EB EXC GRN/WHT 2-0 V-5 (SUTURE) ×4 IMPLANT
SUT MNCRL AB 4-0 PS2 18 (SUTURE) ×4 IMPLANT
SUT PROLENE 4 0 RB 1 (SUTURE) ×8
SUT PROLENE 4 0 SH DA (SUTURE) ×2 IMPLANT
SUT PROLENE 4-0 RB1 .5 CRCL 36 (SUTURE) ×2 IMPLANT
SUT PROLENE 6 0 C 1 30 (SUTURE) IMPLANT
SUT PROLENE 7 0 BV1 MDA (SUTURE) ×2 IMPLANT
SUT STEEL 6MS V (SUTURE) IMPLANT
SUT STEEL SZ 6 DBL 3X14 BALL (SUTURE) ×4 IMPLANT
SUT VIC AB 0 CTX 36 (SUTURE) ×4
SUT VIC AB 0 CTX36XBRD ANTBCTR (SUTURE) ×4 IMPLANT
SUT VIC AB 2-0 CT1 27 (SUTURE) ×6
SUT VIC AB 2-0 CT1 TAPERPNT 27 (SUTURE) ×4 IMPLANT
SUT VIC AB 3-0 X1 27 (SUTURE) IMPLANT
SYSTEM SAHARA CHEST DRAIN ATS (WOUND CARE) ×2 IMPLANT
TAG SUTURE CLAMP YLW 5PR (MISCELLANEOUS) ×2
TAPE CLOTH SURG 4X10 WHT LF (GAUZE/BANDAGES/DRESSINGS) IMPLANT
TAPE PAPER 2X10 WHT MICROPORE (GAUZE/BANDAGES/DRESSINGS) IMPLANT
TOWEL GREEN STERILE (TOWEL DISPOSABLE) ×2 IMPLANT
TOWEL GREEN STERILE FF (TOWEL DISPOSABLE) ×2 IMPLANT
TOWEL OR NON WOVEN STRL DISP B (DISPOSABLE) IMPLANT
TUBING ART PRESS 48 MALE/FEM (TUBING) IMPLANT
TUBING CONNECTING 10 (TUBING) IMPLANT
TUBING LAP HI FLOW INSUFFLATIO (TUBING) ×2 IMPLANT
UNDERPAD 30X36 HEAVY ABSORB (UNDERPADS AND DIAPERS) ×2 IMPLANT
VALVE AORTIC SZ25 INSP/RESIL (Prosthesis & Implant Heart) IMPLANT
VENT LEFT HEART 12002 (CATHETERS) ×2
WATER STERILE IRR 1000ML POUR (IV SOLUTION) ×4 IMPLANT

## 2022-07-11 NOTE — Progress Notes (Signed)
Pt unable to pass VC at this time. NIF -28. CCM MD notified and okay to leave pt on CPAP/PS and retry parameters in 30 minutes. RN aware and RT will continue to monitor and be available as needed.

## 2022-07-11 NOTE — Progress Notes (Signed)
Patient's ABG: pH 7.277, CO2 40. MD Agarwala notified. Orders for 2 amps bicarb.

## 2022-07-11 NOTE — Brief Op Note (Signed)
07/03/2022 - 07/11/2022  1:30 PM  PATIENT:  Craig Lawson  77 y.o. male  PRE-OPERATIVE DIAGNOSIS:  Coronary artery disease, Aortic Stenosis  POST-OPERATIVE DIAGNOSIS:  Coronary artery disease, Aortic Stenosis  PROCEDURE:  Procedure(s): CORONARY ARTERY BYPASS GRAFTING (CABG) X TWO, USING LEFT INTERNAL MAMMARY AND RIGHT LEG GREATER SAPHENOUS VEIN HARVESTED ENDOSCOPICALLY (N/A) AORTIC VALVE REPLACEMENT (AVR) USING INSPIRIS VALVE SIZE 25MM (N/A) TRANSESOPHAGEAL ECHOCARDIOGRAM (N/A) Vein harvest time: 58min Vein prep time: 20min  SURGEON:  Surgeon(s) and Role:    Coralie Common, MD - Primary  PHYSICIAN ASSISTANT: Tao Satz PA-C  ASSISTANTS: Vernie Murders RNFA   ANESTHESIA:   general  EBL:  900 mL   BLOOD ADMINISTERED:none  DRAINS:  MEDIASTINAL AND LEFT PLEURAL CHEST TUBESX3    LOCAL MEDICATIONS USED:  NONE  SPECIMEN:  Source of Specimen:  AORTIC VALVE LEAFLETS  DISPOSITION OF SPECIMEN:  PATHOLOGY  COUNTS:  YES  TOURNIQUET:  * No tourniquets in log *  DICTATION: .Dragon Dictation  PLAN OF CARE: Admit to inpatient   PATIENT DISPOSITION:  ICU - intubated and hemodynamically stable.   Delay start of Pharmacological VTE agent (>24hrs) due to surgical blood loss or risk of bleeding: yes  COMPLICATIONS: NO KNOWN

## 2022-07-11 NOTE — Progress Notes (Signed)
Patient ID: RADER SEGURA, male   DOB: 1945-09-12, 77 y.o.   MRN: [id number]  TCTS Evening Rounds:   Hemodynamically stable  CI = 1.2 on 65 mcg neo. Receiving fluid boluses. Co-ox 54% this afternoon.  Has started to wake up on vent.   Urine output good  CT output low  CBC    Component Value Date/Time   WBC 15.4 (H) 07/11/2022 1258   RBC 4.76 07/11/2022 1258   HGB 15.2 07/11/2022 1258   HGB 17.3 07/25/2019 1519   HCT 41.8 07/11/2022 1258   HCT 48.8 07/25/2019 1519   PLT 120 (L) 07/11/2022 1258   PLT 166 07/25/2019 1519   MCV 87.8 07/11/2022 1258   MCV 90 07/25/2019 1519   MCH 31.9 07/11/2022 1258   MCHC 36.4 (H) 07/11/2022 1258   RDW 13.7 07/11/2022 1258   RDW 14.0 07/25/2019 1519     BMET    Component Value Date/Time   NA 135 07/11/2022 1149   NA 141 11/28/2019 1401   K 5.2 (H) 07/11/2022 1149   CL 101 07/11/2022 1149   CO2 24 07/11/2022 0227   GLUCOSE 138 (H) 07/11/2022 1149   BUN 19 07/11/2022 1149   BUN 12 11/28/2019 1401   CREATININE 1.00 07/11/2022 1149   CALCIUM 8.9 07/11/2022 0227   GFRNONAA >60 07/11/2022 0227     A/P:  Stable postop course. Normal EF, suspect his CI will come up once extubated and with more fluid. Continue current plans

## 2022-07-11 NOTE — Op Note (Addendum)
CARDIOVASCULAR SURGERY OPERATIVE NOTE  07/11/2022 Phillip Heal [id number]  Surgeon:  Everlena Cooper, MD  First Assistant: Jadene Pierini [facility]                               An experienced assistant was required given the complexity of this surgery and the standard of surgical care. The assistant was needed for exposure, dissection, suctioning, retraction of delicate tissues and sutures, instrument exchange and for overall help during this procedure.     Preoperative Diagnosis:  Severe Aortic Stenosis                                               Left main Coronary artery Disease  Postoperative Diagnosis:  Same   Procedure: Aortic Valve Replacement with a 78mm Inspiris Pericardial Valve [id number])                     Coronary artery bypass x 2 with the LIMA to the LAD and RSVG from aorta to OM  Anesthesia:  General Endotracheal   Clinical History/Surgical Indication:   77 yo wm with NYHA class 3 symptoms of CAD and AS. Pt with preserved LV function and should be offered AVR and CABG. 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 He is acceptable to the cell saver. His HCT is normal and he is a large pt so should be better in these regards. He is on Plavix and will need 7 day washout to be certain no issues with surgery. I will try to get him on epo injections prior   Findings: The heart was significantly hypertrophied. The aortic valve was trileaflet with severe calcification. The LAD was a 2 mm vessel with no distal disease, the OM was a 2 mm vessel with no distal disease. The LIMA was excellent and the SVG was somewhat on the large caliber side. At the conclusion of the procedure the pt had a well seated AVR with a mean gradient of 42mmHg and with an EF of 50%   Media Information      Preparation:  The patient was seen in the preoperative holding area and the correct patient, correct operation were confirmed with the patient after  reviewing the medical record and catheterization. The consent was signed by me. Preoperative antibiotics were given. A pulmonary arterial line and radial arterial line were placed by the anesthesia team. The patient was taken back to the operating room and positioned supine on the operating room table. After being placed under general endotracheal anesthesia by the anesthesia team a foley catheter was placed. The neck, chest, abdomen, and both legs were prepped with betadine soap and solution and draped in the usual sterile manner. A surgical time-out was taken and the correct patient and operative procedure were confirmed with the nursing and anesthesia staff.  Operation: Median sternotomy incision was then created and the sternum divided with a sternal saw.  Simultaneously from the right thigh the assistant harvested saphenous vein graft utilizing saphenous excision system and his wound was closed in multilayer's absorbable suture.  The left internal mammary artery was harvested and packed in papaverine soaked sponge.  Heparin was delivered. Pericardial well was developed and the aorta cannulated with a 22 French aortic cannula  and a two-stage cannula placed in the right atrium for venous return.  Antegrade and retrograde cardioplegia catheters were placed in the ascending aorta and coronary sinus effectively.  With adequate confirmation of anticoagulation cardiopulmonary bypass was instituted and a left ventricular vent placed across the right superior pulmonary vein. With adequate bypass aortic cross-clamp was placed and cold blood potassium cardioplegia was delivered for total of 1300 cc antegrade and retrograde.  An additional dose was delivered at approximately 1 hour following crossclamping.  The obtuse marginal was vessel was opened and utilizing a piece of reverse saphenous vein graft and at the side anastomosis was constructed utilizing 7-0 Prolene suture.  It was flushed here and found hemostatic.   The mammary artery was then anastomosed to the LAD in an end-to-side fashion utilizing 8-0 Prolene sutures.  It was flushed de-aired and found to be hemostatic.  The pedicle was pexied to the anterior surface of the heart with 5-0 Prolene suture. An aortotomy was then performed and aortic valve was identified photographed resected and the annulus decalcified.  The left ventricular cavity was copiously irrigated by saline solution and utilizing fifteen 2-0 Tevdek pledgeted sutures with the pledgets on trichomonacide the 25 mm Inspiris valve was secured utilizing a core knot system.  Aortotomy was then closed with a running 4 oh pledgeted Prolene suture.  At this time the saphenous vein proximal was brought off the aorta through a 4.0 mm aortic punch.  Proximal vein graft markers were utilized.  Aortic cross-clamp was removed and the vein graft de-aired in routine fashion in the proximal and distal anastomoses were examined and were hemostatic. Ventricular atrial pacing wires were placed brought out inferior stab wounds and secured.  Patient was then weaned from cardiopulmonary bypass on inotropic support.  With adequate hemodynamics protamine was delivered and the patient decannulated and sites oversewn necessary.  Chest tubes were brought in for stab wound and secured. With adequate hemostasis the sternum was reapproximated with interrupted stainless steel wire and the presternal subcutaneous tissue and skin were closed in multiple layers absorbable suture.  Sterile dressings were applied.

## 2022-07-11 NOTE — Anesthesia Preprocedure Evaluation (Signed)
Anesthesia Evaluation  Patient identified by MRN, date of birth, ID band Patient awake    Reviewed: Allergy & Precautions, NPO status , Patient's Chart, lab work & pertinent test results  History of Anesthesia Complications Negative for: history of anesthetic complications  Airway Mallampati: III  TM Distance: >3 FB Neck ROM: Full    Dental  (+) Dental Advisory Given, Teeth Intact,    Pulmonary sleep apnea , former smoker   breath sounds clear to auscultation       Cardiovascular hypertension, Pt. on medications + CAD, + Past MI and + DOE  + Valvular Problems/Murmurs AS and AI  Rhythm:Regular  1. The aortic valve is calcified. There is severe calcifcation of the  aortic valve. There is moderate thickening of the aortic valve. Aortic  valve regurgitation is mild. Moderate aortic valve stenosis. Aortic valve  mean gradient measures 28.0 mmHg.  Aortic valve Vmax measures 3.63 m/s.   2. Left ventricular ejection fraction, by estimation, is 60 to 65%. The  left ventricle has normal function. The left ventricle has no regional  wall motion abnormalities. Left ventricular diastolic parameters are  consistent with Grade I diastolic  dysfunction (impaired relaxation).   3. Right ventricular systolic function is normal. The right ventricular  size is normal.   4. Left atrial size was mildly dilated.   5. The mitral valve is normal in structure. No evidence of mitral valve  regurgitation. No evidence of mitral stenosis.   6. The inferior vena cava is normal in size with greater than 50%  respiratory variability, suggesting right atrial pressure of 3 mmHg.      Dist RCA lesion is 50% stenosed.   2nd Diag lesion is 25% stenosed.   1st Diag lesion is 75% stenosed.   RPDA lesion is 30% stenosed.   RPAV lesion is 60% stenosed.   Prox LAD to Mid LAD lesion is 20% stenosed.   Ost Cx to Prox Cx lesion is 90% stenosed.   Mid LM to  Ost LAD lesion is 80% stenosed.   Ost LAD lesion is 80% stenosed.   Previously placed 1st Mrg stent of unknown type is  widely patent.   1. Severe distal left main stenosis (IVUS minimum lumen area of 4.0 mm2) 2. Severe ostial LAD stenosis (IVUD minimum lumen area of 3.5 mm2). Patent mid LAD stent.  3. Severe ostial Circumflex stenosis. Patent obtuse marginal stent 4. Large dominant RCA with moderate distal stenosis, patent PDA stent, moderate posterolateral artery stenosis.  5. Severe aortic stenosis by echo (Cath data: mean gradient 32.8 mmHg, peak to peak gradient 42 mmHg)   Recommendations: Severe distal left main stenosis involving both the LAD and Circumflex, confirmed with IVUS imaging. Severe aortic stenosis. He will need a CT surgery consult for CABG and AVR. He has been on Plavix so I will begin holding that tonight. Repeat echo tomorrow. 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 He will also need Plavix washout before surgery. Resume IV heparin 8 hours post sheath pull. Will call CT surgery to see him tomorrow.      Neuro/Psych TIA Neuromuscular disease  negative psych ROS   GI/Hepatic negative GI ROS, Neg liver ROS,,,  Endo/Other  negative endocrine ROS  Lab Results      Component                Value  Date                      HGBA1C                   5.3                 07/10/2022             Renal/GU Lab Results      Component                Value               Date                      CREATININE               1.00                07/11/2022                Musculoskeletal negative musculoskeletal ROS (+)    Abdominal   Peds  Hematology  (+) REFUSES BLOOD PRODUCTS, JEHOVAH'S WITNESS  Anesthesia Other Findings   Reproductive/Obstetrics                             Anesthesia Physical Anesthesia Plan  ASA: 4  Anesthesia Plan: General   Post-op Pain Management:     Induction: Intravenous  PONV Risk Score and Plan: 2 and Ondansetron and Treatment may vary due to age or medical condition  Airway Management Planned: Oral ETT  Additional Equipment: Arterial line, PA Cath, CVP, TEE and Ultrasound Guidance Line Placement  Intra-op Plan:   Post-operative Plan: Post-operative intubation/ventilation  Informed Consent: I have reviewed the patients History and Physical, chart, labs and discussed the procedure including the risks, benefits and alternatives for the proposed anesthesia with the patient or authorized representative who has indicated his/her understanding and acceptance.     Dental advisory given  Plan Discussed with: CRNA  Anesthesia Plan Comments:         Anesthesia Quick Evaluation

## 2022-07-11 NOTE — Consult Note (Signed)
NAME:  Craig Lawson, MRN:  [id number], DOB:  1945/07/24, LOS: 8 ADMISSION DATE:  07/03/2022, CONSULTATION DATE:  07/11/2022 REFERRING MD: Yolanda Manges, CHIEF COMPLAINT: Postcardiac surgery care.  History of Present Illness:  77 year old man who underwent uneventful bioprosthetic AVR and two-vessel bypass.  He was admitted 8 days ago for crescendo angina resulting in NSTEMI.  Found also to have moderate to severe aortic stenosis as well as two-vessel coronary disease.  Deemed to be at high risk for subsequent MACE, so kept in hospital for urgent revascularization.  Required Plavix washout. Jehovah's Witness.  Refuses blood and albumin.  Did receive preoperative erythropoietin.  Pertinent  Medical History   Past Medical History:  Diagnosis Date   Aortic stenosis 08/10/2015   Bilateral leg edema 08/22/2015   Bradycardia 06/01/2017   Cellulitis of left lower extremity 04/29/2017   Coronary artery disease involving native coronary artery 10/22/2017   DOE (dyspnea on exertion) 05/04/2017   Essential hypertension 08/13/2015   Heart murmur 04/29/2017   Kissing, osteophytes 08/22/2015   LBBB (left bundle branch block) 08/22/2015   Lipid disorder 11/30/2017   Lumbar facet joint pain 08/22/2015   Medicare annual wellness visit, subsequent 06/02/2016   Morbid obesity ([place]) 10/22/2017   Murmur 04/29/2017   Myopathy 09/02/2019   Formatting of this note might be different from the original. Secondary to high freq statin use.   Need for vaccination for Strep pneumoniae 03/27/2017   Non-ST elevation myocardial infarction (NSTEMI), subendocardial infarction, subsequent episode of care ([place]) 11/30/2017   Obesity, Class II, BMI 35-39.9 08/10/2015   OSA on CPAP 12/02/2017   Pure hypercholesterolemia 05/24/2015   Seborrheic keratoses 03/27/2017   Overview:  Of the right forehead.   Statin intolerance 11/30/2017   Thrombocytopenia ([place]) 04/29/2017   TIA (transient ischemic attack) 12/19/2016    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   Venous insufficiency 08/10/2015     Significant Hospital Events: Including procedures, antibiotic start and stop dates in addition to other pertinent events   3/22 aortic Valve Replacement with a 68mm Inspiris Pericardial Valve [id number]), LIMA to LAD, saphenous vein graft to OM1  Interim History / Subjective:  Hypotensive on arrival with low filling pressures.  Objective   Blood pressure (!) 140/76, pulse (!) 58, temperature 98.1 F (36.7 C), temperature source Oral, resp. rate 16, height 5\' 9"  (1.753 m), weight 113.2 kg, SpO2 94 %. PAP: (21-36)/(0-19) 31/19      Intake/Output Summary (Last 24 hours) at 07/11/2022 1259 Last data filed at 07/11/2022 1225 Gross per 24 hour  Intake 3566.29 ml  Output 1715 ml  Net 1851.29 ml   Filed Weights   07/09/22 0500 07/10/22 0446 07/11/22 0500  Weight: 114.3 kg 113.9 kg 113.2 kg    Examination: General: Moderately obese.  Sedated and intubated HENT: ET tube OG tube in place Lungs: clear Cardiovascular: Clean midline sternotomy incision.  Positive pericardial friction rub.  Minimal chest tube drainage.  AAI pacing bradycardic in the 60s Abdomen: Soft. Extremities: cool. Right leg wrapped.  Neuro: Remains sedated. GU: Foley catheter in place with clear urine.  Ancillary tests personally reviewed:  Chest x-ray shows support devices in appropriate position.  Clear lung fields with minimal atelectasis apart from left lower lobe. Creatinine 1.0, hemoglobin 11.   K: 5.5 ABG: 7.32/48.6/97  Assessment & Plan:  Critically ill following AVR CABG x 2 requiring postoperative ventilator management, fluid titration and titration of vasopressors. Coronary artery disease status post revascularization Severe aortic stenosis status post valve replacement Dyslipidemia  Hypertension OSA intolerant of CPAP  Plan:  -Volume resuscitation track index and pulse pressure variation. -Appropriate for fast-track  extubation -Crystalloid for fluid resuscitation alone. -AAI pacing to help increase cardiac output. -Monitor chest tube output once drainage nominal proceed to ventilator weaning -Initiate multimodal pain control regimen. -Resume secondary prevention once extubated.  Best Practice (right click and "Reselect all SmartList Selections" daily)   Diet/type: NPO DVT prophylaxis: not indicated GI prophylaxis: H2B Lines: Central line and Arterial Line Foley:  Yes, and it is still needed Code Status:  full code Last date of multidisciplinary goals of care discussion [per cardiac surgery]  CRITICAL CARE Performed by: Kipp Brood   Total critical care time: 35 minutes  Critical care time was exclusive of separately billable procedures and treating other patients.  Critical care was necessary to treat or prevent imminent or life-threatening deterioration.  Critical care was time spent personally by me on the following activities: development of treatment plan with patient and/or surrogate as well as nursing, discussions with consultants, evaluation of patient's response to treatment, examination of patient, obtaining history from patient or surrogate, ordering and performing treatments and interventions, ordering and review of laboratory studies, ordering and review of radiographic studies, pulse oximetry, re-evaluation of patient's condition and participation in multidisciplinary rounds.  Kipp Brood, MD [facility] ICU Physician [place]  Pager: [phone] Mobile: [phone] After hours: [phone].

## 2022-07-11 NOTE — Transfer of Care (Signed)
Immediate Anesthesia Transfer of Care Note  Patient: Craig Lawson  Procedure(s) Performed: CORONARY ARTERY BYPASS GRAFTING (CABG) X TWO, USING LEFT INTERNAL MAMMARY AND RIGHT LEG GREATER SAPHENOUS VEIN HARVESTED ENDOSCOPICALLY (Chest) AORTIC VALVE REPLACEMENT (AVR) USING INSPIRIS VALVE SIZE 25MM (Chest) TRANSESOPHAGEAL ECHOCARDIOGRAM  Patient Location: PACU  Anesthesia Type:General  Level of Consciousness: sedated and Patient remains intubated per anesthesia plan  Airway & Oxygen Therapy: Patient remains intubated per anesthesia plan and Patient placed on Ventilator (see vital sign flow sheet for setting)  Post-op Assessment: Report given to RN and Post -op Vital signs reviewed and stable  Post vital signs: Reviewed and stable  Last Vitals:  Vitals Value Taken Time  BP 104/74 07/11/22 1251  Temp 37 C 07/11/22 1259  Pulse 80 07/11/22 1259  Resp 0 07/11/22 1259  SpO2 95 % 07/11/22 1259  Vitals shown include unvalidated device data.  Last Pain:  Vitals:   07/11/22 0452  TempSrc: Oral  PainSc:       Patients Stated Pain Goal: 0 ([id number])  Complications: No notable events documented.

## 2022-07-11 NOTE — Procedures (Signed)
Extubation Procedure Note  Patient Details:   Name: Craig Lawson DOB: 1945/09/02 MRN: [id number]   Airway Documentation:    Vent end date: 07/11/22 Vent end time: 2050   Evaluation  O2 sats: stable throughout Complications: No apparent complications Patient did tolerate procedure well. Bilateral Breath Sounds: Clear   Yes  Cuff leak noted prior to extubation.  NIF -25  VC 1.03L  Patient currently on 5L nasal cannula tolerating well.   Craig Lawson R Oiva Dibari 07/11/2022, 8:57 PM

## 2022-07-11 NOTE — Interval H&P Note (Signed)
History and Physical Interval Note:  07/11/2022 6:31 AM  Craig Lawson  has presented today for surgery, with the diagnosis of CAD AS.  The various methods of treatment have been discussed with the patient and family. After consideration of risks, benefits and other options for treatment, the patient has consented to  Procedure(s): CORONARY ARTERY BYPASS GRAFTING (CABG) (N/A) AORTIC VALVE REPLACEMENT (AVR) (N/A) TRANSESOPHAGEAL ECHOCARDIOGRAM (N/A) as a surgical intervention.  The patient's history has been reviewed, patient examined, no change in status, stable for surgery.  I have reviewed the patient's chart and labs.  Questions were answered to the patient's satisfaction.     Coralie Common

## 2022-07-11 NOTE — Anesthesia Procedure Notes (Signed)
Arterial Line Insertion Start/End3/22/2024 7:00 AM, 07/11/2022 7:05 AM Performed by: Colin Benton, CRNA, CRNA  Patient location: Pre-op. Preanesthetic checklist: patient identified, IV checked, site marked, risks and benefits discussed, surgical consent, monitors and equipment checked, pre-op evaluation, timeout performed and anesthesia consent Lidocaine 1% used for infiltration Left, radial was placed Catheter size: 20 G Hand hygiene performed , maximum sterile barriers used  and Seldinger technique used Allen's test indicative of satisfactory collateral circulation Attempts: 1 Procedure performed without using ultrasound guided technique. Following insertion, dressing applied and Biopatch. Post procedure assessment: normal and unchanged  Patient tolerated the procedure well with no immediate complications.

## 2022-07-11 NOTE — Anesthesia Procedure Notes (Signed)
Central Venous Catheter Insertion Performed by: Oleta Mouse, MD, anesthesiologist Start/End3/22/2024 8:01 AM, 07/11/2022 8:21 AM Patient location: Pre-op. Preanesthetic checklist: patient identified, IV checked, site marked, risks and benefits discussed, surgical consent, monitors and equipment checked, pre-op evaluation, timeout performed and anesthesia consent Hand hygiene performed  and maximum sterile barriers used  PA cath was placed.Swan type:thermodilution Procedure performed without using ultrasound guided technique. Attempts: 1 Patient tolerated the procedure well with no immediate complications.

## 2022-07-11 NOTE — Anesthesia Procedure Notes (Signed)
Central Venous Catheter Insertion Performed by: Oleta Mouse, MD, anesthesiologist Start/End3/22/2024 8:04 AM, 07/11/2022 8:21 AM Patient location: Pre-op. Preanesthetic checklist: patient identified, IV checked, risks and benefits discussed, surgical consent, monitors and equipment checked, pre-op evaluation, timeout performed and anesthesia consent Position: supine Lidocaine 1% used for infiltration and patient sedated Hand hygiene performed  and maximum sterile barriers used  Catheter size: 8.5 Fr Sheath introducer Procedure performed using ultrasound guided technique. Ultrasound Notes:anatomy identified, needle tip was noted to be adjacent to the nerve/plexus identified, no ultrasound evidence of intravascular and/or intraneural injection and image(s) printed for medical record Attempts: 1 Following insertion, line sutured, dressing applied and Biopatch. Post procedure assessment: blood return through all ports, free fluid flow and no air  Patient tolerated the procedure well with no immediate complications.

## 2022-07-11 NOTE — Anesthesia Procedure Notes (Signed)
Procedure Name: Intubation Date/Time: 07/11/2022 7:59 AM  Performed by: Colin Benton, CRNAPre-anesthesia Checklist: Patient identified, Emergency Drugs available, Suction available and Patient being monitored Patient Re-evaluated:Patient Re-evaluated prior to induction Oxygen Delivery Method: Circle system utilized Preoxygenation: Pre-oxygenation with 100% oxygen Induction Type: IV induction Ventilation: Mask ventilation without difficulty Laryngoscope Size: Miller and 3 Grade View: Grade II Tube type: Oral Tube size: 8.0 mm Number of attempts: 1 Airway Equipment and Method: Stylet and Oral airway Placement Confirmation: ETT inserted through vocal cords under direct vision, positive ETCO2 and breath sounds checked- equal and bilateral Secured at: 23 cm Tube secured with: Tape Dental Injury: Teeth and Oropharynx as per pre-operative assessment

## 2022-07-11 NOTE — Hospital Course (Addendum)
[facility] Cardiologist: Shirlee More, MD    History of Present Illness:    The patient is a 77 year old male followed by Dr. Bettina Gavia with cardiology with a significant cardiac history dating back to 2019 who has had multiple percutaneous procedures.  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  Recently has developed progressive angina symptoms and has been reevaluated including L HC/RHC and repeat echocardiogram.  He now is noted to have severe LM stenosis.  He was not felt to be amenable to further percutaneous procedures and cardiothoracic surgical consultation was obtained.  Dr. Lavonna Monarch evaluated the patient and all relevant studies and recommended proceeding with AVR/CABG.  Hospital course:  Following full diagnostic evaluation and medical stabilization he was felt to be stable to proceed and on 07/11/2022 he was taken the operating room at which time he underwent CABG x 2 and aortic valve replacement.  He tolerated the procedure well and was taken to the surgical intensive care unit in stable condition.  Postoperative hospital course:  The patient was extubated using standard post cardiac surgical protocols without difficulty.  He has remained hemodynamically stable but did require norepinephrine in the early postoperative period, This was weaned over time without difficulty.  He did have an expected postoperative volume excess with some acute kidney injury limiting the use of diuretics initially.  All routine lines, monitors and drainage devices have been discontinued in the standard fashion.  The critical care medicine service was consulted to assist with ICU management.  He did develop a moderate hyponatremia which is being monitored clinically.  He does have an expected acute blood loss  anemia which is being monitored clinically.  He is not at the transfusion threshold.  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  He has been placed on iron supplement.  Blood sugars have been under adequate control using standard protocols.  He did develop a reactive leukocytosis but white blood cell count is trending towards normal over time.  He was placed on vancomycin and aztreonam for pulmonary coverage.  He has not had any significant fevers.  He did have a postoperative expected volume overload but is responding well to diuretics.  Early operatively he developed an AKI but BUN and creatinine continues to trend towards normal.  Creatinine peaked at 2.19. He remained hemodynamically stable while atrial paced. He was transitioned to oral amiodarone and set to back up pacing.  He maintained NSR overnight.  His pacing wires were removed without difficulty.  He developed shortness of breath however CXR did not show evidence of significant pleural effusion.

## 2022-07-12 ENCOUNTER — Inpatient Hospital Stay (HOSPITAL_COMMUNITY): Payer: Medicare Other

## 2022-07-12 ENCOUNTER — Encounter (HOSPITAL_COMMUNITY): Payer: Self-pay | Admitting: Thoracic Surgery (Cardiothoracic Vascular Surgery)

## 2022-07-12 LAB — GLUCOSE, CAPILLARY
Glucose-Capillary: 135 mg/dL — ABNORMAL HIGH (ref 70–99)
Glucose-Capillary: 143 mg/dL — ABNORMAL HIGH (ref 70–99)
Glucose-Capillary: 148 mg/dL — ABNORMAL HIGH (ref 70–99)
Glucose-Capillary: 149 mg/dL — ABNORMAL HIGH (ref 70–99)
Glucose-Capillary: 151 mg/dL — ABNORMAL HIGH (ref 70–99)
Glucose-Capillary: 155 mg/dL — ABNORMAL HIGH (ref 70–99)
Glucose-Capillary: 155 mg/dL — ABNORMAL HIGH (ref 70–99)
Glucose-Capillary: 157 mg/dL — ABNORMAL HIGH (ref 70–99)
Glucose-Capillary: 157 mg/dL — ABNORMAL HIGH (ref 70–99)
Glucose-Capillary: 157 mg/dL — ABNORMAL HIGH (ref 70–99)
Glucose-Capillary: 162 mg/dL — ABNORMAL HIGH (ref 70–99)
Glucose-Capillary: 178 mg/dL — ABNORMAL HIGH (ref 70–99)
Glucose-Capillary: 180 mg/dL — ABNORMAL HIGH (ref 70–99)

## 2022-07-12 LAB — CBC
HCT: 41.4 % (ref 39.0–52.0)
HCT: 38 % — ABNORMAL LOW (ref 39.0–52.0)
Hemoglobin: 14.8 g/dL (ref 13.0–17.0)
Hemoglobin: 12.9 g/dL — ABNORMAL LOW (ref 13.0–17.0)
MCH: 31.8 pg (ref 26.0–34.0)
MCH: 31.1 pg (ref 26.0–34.0)
MCHC: 35.7 g/dL (ref 30.0–36.0)
MCHC: 33.9 g/dL (ref 30.0–36.0)
MCV: 89 fL (ref 80.0–100.0)
MCV: 91.6 fL (ref 80.0–100.0)
Platelets: 159 10*3/uL (ref 150–400)
Platelets: 124 10*3/uL — ABNORMAL LOW (ref 150–400)
RBC: 4.65 MIL/uL (ref 4.22–5.81)
RBC: 4.15 MIL/uL — ABNORMAL LOW (ref 4.22–5.81)
RDW: 14.4 % (ref 11.5–15.5)
RDW: 14.5 % (ref 11.5–15.5)
WBC: 18.6 10*3/uL — ABNORMAL HIGH (ref 4.0–10.5)
WBC: 16.6 10*3/uL — ABNORMAL HIGH (ref 4.0–10.5)
nRBC: 0 % (ref 0.0–0.2)
nRBC: 0 % (ref 0.0–0.2)

## 2022-07-12 LAB — BASIC METABOLIC PANEL
Anion gap: 9 (ref 5–15)
Anion gap: 5 (ref 5–15)
BUN: 22 mg/dL (ref 8–23)
BUN: 31 mg/dL — ABNORMAL HIGH (ref 8–23)
CO2: 22 mmol/L (ref 22–32)
CO2: 26 mmol/L (ref 22–32)
Calcium: 7.7 mg/dL — ABNORMAL LOW (ref 8.9–10.3)
Calcium: 7.6 mg/dL — ABNORMAL LOW (ref 8.9–10.3)
Chloride: 106 mmol/L (ref 98–111)
Chloride: 100 mmol/L (ref 98–111)
Creatinine, Ser: 1.43 mg/dL — ABNORMAL HIGH (ref 0.61–1.24)
Creatinine, Ser: 1.93 mg/dL — ABNORMAL HIGH (ref 0.61–1.24)
GFR, Estimated: 50 mL/min — ABNORMAL LOW (ref >60)
GFR, Estimated: 35 mL/min — ABNORMAL LOW (ref >60)
Glucose, Bld: 142 mg/dL — ABNORMAL HIGH (ref 70–99)
Glucose, Bld: 141 mg/dL — ABNORMAL HIGH (ref 70–99)
Potassium: 4.5 mmol/L (ref 3.5–5.1)
Potassium: 4.6 mmol/L (ref 3.5–5.1)
Sodium: 137 mmol/L (ref 135–145)
Sodium: 131 mmol/L — ABNORMAL LOW (ref 135–145)

## 2022-07-12 LAB — MAGNESIUM
Magnesium: 3 mg/dL — ABNORMAL HIGH (ref 1.7–2.4)
Magnesium: 2.7 mg/dL — ABNORMAL HIGH (ref 1.7–2.4)

## 2022-07-12 MED ORDER — INSULIN ASPART 100 UNIT/ML IJ SOLN
0.0000 [IU] | INTRAMUSCULAR | Status: DC
  Administered 2022-07-12: 4 [IU] via SUBCUTANEOUS
  Administered 2022-07-12: 2 [IU] via SUBCUTANEOUS
  Administered 2022-07-12: 4 [IU] via SUBCUTANEOUS
  Administered 2022-07-13: 2 [IU] via SUBCUTANEOUS

## 2022-07-12 MED ORDER — ENOXAPARIN SODIUM 40 MG/0.4ML IJ SOSY
40.0000 mg | PREFILLED_SYRINGE | Freq: Every day | INTRAMUSCULAR | Status: DC
  Administered 2022-07-12 – 2022-07-17 (×6): 40 mg via SUBCUTANEOUS
  Filled 2022-07-12 (×6): qty 0.4

## 2022-07-12 MED ORDER — FUROSEMIDE 10 MG/ML IJ SOLN: 40.0000 mg | Freq: Once | INTRAMUSCULAR | Status: DC

## 2022-07-12 MED ORDER — TRAMADOL HCL 50 MG PO TABS
50.0000 mg | ORAL_TABLET | Freq: Four times a day (QID) | ORAL | Status: DC | PRN
  Administered 2022-07-12 – 2022-07-14 (×3): 50 mg via ORAL
  Filled 2022-07-12 (×3): qty 1

## 2022-07-12 MED ORDER — APIXABAN 5 MG PO TABS: 5.0000 mg | ORAL_TABLET | ORAL | Status: DC

## 2022-07-12 MED ORDER — GABAPENTIN 100 MG PO CAPS
100.0000 mg | ORAL_CAPSULE | Freq: Two times a day (BID) | ORAL | Status: DC
  Administered 2022-07-12 – 2022-07-22 (×20): 100 mg via ORAL
  Filled 2022-07-12 (×20): qty 1

## 2022-07-12 NOTE — Progress Notes (Signed)
Progress Note  Patient Name: Craig Lawson Date of Encounter: 07/12/2022  Primary Cardiologist:   Shirlee More, MD   Subjective   No distress.  Usual soreness post CABG.  Mildly SOB  Inpatient Medications    Scheduled Meds:  acetaminophen  1,000 mg Oral Q6H   Or   acetaminophen (TYLENOL) oral liquid 160 mg/5 mL  1,000 mg Per Tube Q6H   aspirin EC  325 mg Oral Daily   Or   aspirin  324 mg Per Tube Daily   bisacodyl  10 mg Oral Daily   Or   bisacodyl  10 mg Rectal Daily   Chlorhexidine Gluconate Cloth  6 each Topical Q0600   docusate sodium  200 mg Oral Daily   ezetimibe  10 mg Oral QHS   ferrous sulfate  325 mg Oral Q breakfast   metoprolol tartrate  12.5 mg Oral BID   Or   metoprolol tartrate  12.5 mg Per Tube BID   [START ON 07/13/2022] pantoprazole  40 mg Oral Daily   rosuvastatin  20 mg Oral Daily   sodium chloride flush  3 mL Intravenous Q12H   Continuous Infusions:  sodium chloride 10 mL/hr at 07/12/22 0700   sodium chloride 1 mL/hr at 07/12/22 0700   sodium chloride     albumin human     dexmedetomidine (PRECEDEX) IV infusion Stopped (07/11/22 1730)   insulin 2 Units/hr (07/12/22 0700)   lactated ringers     lactated ringers 20 mL/hr at 07/12/22 0700   levofloxacin (LEVAQUIN) IV     nitroGLYCERIN     norepinephrine (LEVOPHED) Adult infusion 6 mcg/min (07/12/22 0700)   phenylephrine (NEO-SYNEPHRINE) Adult infusion Stopped (07/11/22 1745)   PRN Meds: sodium chloride, albumin human, dextrose, metoprolol tartrate, midazolam, morphine injection, ondansetron (ZOFRAN) IV, oxyCODONE, sodium chloride flush, traMADol   Vital Signs    Vitals:   07/12/22 0409 07/12/22 0500 07/12/22 0600 07/12/22 0700  BP:  123/84 126/85 128/79  Pulse: 92 92 92 92  Resp: (!) 21 (!) 21 (!) 25 (!) 27  Temp: 99.9 F (37.7 C) 99.9 F (37.7 C) 99.7 F (37.6 C) 99.3 F (37.4 C)  TempSrc:      SpO2: 95% 93% 93% 94%  Weight:  118.3 kg    Height:        Intake/Output Summary  (Last 24 hours) at 07/12/2022 0736 Last data filed at 07/12/2022 0700 Gross per 24 hour  Intake 5949.83 ml  Output 3610 ml  Net 2339.83 ml   Filed Weights   07/10/22 0446 07/11/22 0500 07/12/22 0500  Weight: 113.9 kg 113.2 kg 118.3 kg    Telemetry     Atrial paced - Personally Reviewed  ECG    NSR, rate 60 , LBBB - Personally Reviewed  Physical Exam   GEN: No acute distress.   Neck: No  JVD Cardiac: RRR, no murmurs, rubs, or gallops.  Respiratory: Clear  to auscultation bilaterally.  Poor inspiratory effort GI: Soft, nontender, non-distended  MS: No Diffuse edema; No deformity. Neuro:  Nonfocal  Psych: Normal affect   Labs    Chemistry Recent Labs  Lab 07/11/22 0227 07/11/22 0803 07/11/22 1149 07/11/22 1300 07/11/22 1843 07/11/22 2006 07/11/22 2214 07/12/22 0311  NA 135   < > 135   < > 135 136 139 137  K 4.0   < > 5.2*   < > 5.4* 5.2* 4.5 4.5  CL 103   < > 101  --  105  --   --  106  CO2 24  --   --   --  21*  --   --  22  GLUCOSE 94   < > 138*  --  125*  --   --  142*  BUN 19   < > 19  --  18  --   --  22  CREATININE 1.10   < > 1.00  --  1.10  --   --  1.43*  CALCIUM 8.9  --   --   --  7.4*  --   --  7.7*  GFRNONAA >60  --   --   --  >60  --   --  50*  ANIONGAP 8  --   --   --  9  --   --  9   < > = values in this interval not displayed.     Hematology Recent Labs  Lab 07/11/22 1258 07/11/22 1300 07/11/22 1843 07/11/22 2006 07/11/22 2214 07/12/22 0311  WBC 15.4*  --  21.8*  --   --  18.6*  RBC 4.76  --  4.77  --   --  4.65  HGB 15.2   < > 15.2 13.6 12.9* 14.8  HCT 41.8   < > 41.7 40.0 38.0* 41.4  MCV 87.8  --  87.4  --   --  89.0  MCH 31.9  --  31.9  --   --  31.8  MCHC 36.4*  --  36.5*  --   --  35.7  RDW 13.7  --  14.0  --   --  14.4  PLT 120*  --  182  --   --  159   < > = values in this interval not displayed.    Cardiac EnzymesNo results for input(s): "TROPONINI" in the last 168 hours. No results for input(s): "TROPIPOC" in the last  168 hours.   BNPNo results for input(s): "BNP", "PROBNP" in the last 168 hours.   DDimer No results for input(s): "DDIMER" in the last 168 hours.   Radiology    DG Chest Port 1 View  Result Date: 07/11/2022 CLINICAL DATA:  Post AVR EXAM: PORTABLE CHEST 1 VIEW COMPARISON:  Portable exam 1312 hours compared to preoperative study of 07/10/2022 FINDINGS: Tip of endotracheal tube projects 5.4 cm above carina. Nasogastric tube extends into stomach. Mediastinal drain and LEFT thoracostomy tube present. RIGHT jugular Swan-Ganz catheter with tip projecting over proximal RIGHT pulmonary artery. Slightly prominent cardiac silhouette post AVR. Atherosclerotic calcification aorta. Bibasilar atelectasis without pleural effusion or pneumothorax. IMPRESSION: Expected postoperative changes post AVR. Electronically Signed   By: Lavonia Dana M.D.   On: 07/11/2022 13:22   EP STUDY  Result Date: 07/11/2022 See surgical note for result.  DG Chest 2 View  Result Date: 07/10/2022 CLINICAL DATA:  Aortic stenosis.  Preop. EXAM: CHEST - 2 VIEW COMPARISON:  July 03, 2022. FINDINGS: Stable cardiomediastinal silhouette. Both lungs are clear. The visualized skeletal structures are unremarkable. IMPRESSION: No active cardiopulmonary disease. Electronically Signed   By: Marijo Conception M.D.   On: 07/10/2022 10:09    Cardiac Studies    Echo 07/05/22:  1. The aortic valve is calcified. There is severe calcifcation of the  aortic valve. There is moderate thickening of the aortic valve. Aortic  valve regurgitation is mild. Moderate aortic valve stenosis. Aortic valve  mean gradient measures 28.0 mmHg.  Aortic valve Vmax measures 3.63 m/s.   2. Left ventricular ejection fraction, by estimation,  is 60 to 65%. The  left ventricle has normal function. The left ventricle has no regional  wall motion abnormalities. Left ventricular diastolic parameters are  consistent with Grade I diastolic  dysfunction (impaired relaxation).    3. Right ventricular systolic function is normal. The right ventricular  size is normal.   4. Left atrial size was mildly dilated.   5. The mitral valve is normal in structure. No evidence of mitral valve  regurgitation. No evidence of mitral stenosis.   6. The inferior vena cava is normal in size with greater than 50%  respiratory variability, suggesting right atrial pressure of 3 mmHg.      LHC 07/04/22:   Dist RCA lesion is 50% stenosed.   2nd Diag lesion is 25% stenosed.   1st Diag lesion is 75% stenosed.   RPDA lesion is 30% stenosed.   RPAV lesion is 60% stenosed.   Prox LAD to Mid LAD lesion is 20% stenosed.   Ost Cx to Prox Cx lesion is 90% stenosed.   Mid LM to Ost LAD lesion is 80% stenosed.   Ost LAD lesion is 80% stenosed.   Previously placed 1st Mrg stent of unknown type is  widely patent.   Severe distal left main stenosis (IVUS minimum lumen area of 4.0 mm2) Severe ostial LAD stenosis (IVUD minimum lumen area of 3.5 mm2). Patent mid LAD stent.  Severe ostial Circumflex stenosis. Patent obtuse marginal stent Large dominant RCA with moderate distal stenosis, patent PDA stent, moderate posterolateral artery stenosis.  Severe aortic stenosis by echo (Cath data: mean gradient 32.8 mmHg, peak to peak gradient 42 mmHg)    Patient Profile     77 y.o. male status post AVR/CABG.    Assessment & Plan    CAD with prior PCI,  non-ST elevation myocardial infarction:  Post CABG.   (L to LAD, SVT OM).  AVR with Inspiris pericardial valve.   CO/CI 4.5/2 .  Phenylephrine off, norepi weaning down.  Underlying rhythm is NSR, rate 60 , LBBB.        Hyperlipidemia with LDL goal < 70:  Continue Crestor.     Severe AS:  As above.     OSA :  intolerant to CPAP   LBBB:  Chronic, no syncope    For questions or updates, please contact [place] Please consult [email or web address] for contact info under Cardiology/STEMI.   Signed, Minus Breeding, MD  07/12/2022, 7:36 AM

## 2022-07-12 NOTE — Progress Notes (Signed)
Patient ID: Craig Lawson, male   DOB: 03-17-1946, 77 y.o.   MRN: [id number] TCTS Evening Rounds:  Hemodynamically stable off vasopressors. Sinus rhythm.  BMET    Component Value Date/Time   NA 131 (L) 07/12/2022 1555   NA 141 11/28/2019 1401   K 4.6 07/12/2022 1555   CL 100 07/12/2022 1555   CO2 26 07/12/2022 1555   GLUCOSE 141 (H) 07/12/2022 1555   BUN 31 (H) 07/12/2022 1555   BUN 12 11/28/2019 1401   CREATININE 1.93 (H) 07/12/2022 1555   CALCIUM 7.6 (L) 07/12/2022 1555   GFRNONAA 35 (L) 07/12/2022 1555   CBC    Component Value Date/Time   WBC 16.6 (H) 07/12/2022 1555   RBC 4.15 (L) 07/12/2022 1555   HGB 12.9 (L) 07/12/2022 1555   HGB 17.3 07/25/2019 1519   HCT 38.0 (L) 07/12/2022 1555   HCT 48.8 07/25/2019 1519   PLT 124 (L) 07/12/2022 1555   PLT 166 07/25/2019 1519   MCV 91.6 07/12/2022 1555   MCV 90 07/25/2019 1519   MCH 31.1 07/12/2022 1555   MCHC 33.9 07/12/2022 1555   RDW 14.5 07/12/2022 1555   RDW 14.0 07/25/2019 1519   Creat bumped further to 1.93 this evening. Will hold on diuresis for now and repeat in am. Filling pressures were low this am.

## 2022-07-12 NOTE — Progress Notes (Signed)
1 Day Post-Op Procedure(s) (LRB): CORONARY ARTERY BYPASS GRAFTING (CABG) X TWO, USING LEFT INTERNAL MAMMARY AND RIGHT LEG GREATER SAPHENOUS VEIN HARVESTED ENDOSCOPICALLY (N/A) AORTIC VALVE REPLACEMENT (AVR) USING INSPIRIS VALVE SIZE 25MM (N/A) TRANSESOPHAGEAL ECHOCARDIOGRAM (N/A) Subjective:  Pain with deep breaths, otherwise ok  Objective: Vital signs in last 24 hours: Temp:  [98.2 F (36.8 C)-101.3 F (38.5 C)] 99.3 F (37.4 C) (03/23 0800) Pulse Rate:  [80-92] 92 (03/23 0800) Cardiac Rhythm: Normal sinus rhythm (03/22 2030) Resp:  [0-30] 25 (03/23 0800) BP: (94-128)/(61-91) 128/91 (03/23 0800) SpO2:  [93 %-98 %] 93 % (03/23 0800) Arterial Line BP: (86-235)/(57-94) 127/64 (03/23 0810) FiO2 (%):  [40 %-50 %] 40 % (03/22 2030) Weight:  [118.3 kg] 118.3 kg (03/23 0500)  Hemodynamic parameters for last 24 hours: PAP: (22-37)/(5-24) 37/5 CO:  [2.8 L/min-4.8 L/min] 4 L/min CI:  [1.2 L/min/m2-2.1 L/min/m2] 1.8 L/min/m2  Intake/Output from previous day: 03/22 0701 - 03/23 0700 In: 5949.8 [I.V.:3527; Blood:696; IV Piggyback:1726.9] Out: 3610 [Urine:1980; Blood:900; Chest Tube:730] Intake/Output this shift: No intake/output data recorded.  General appearance: alert and cooperative Neurologic: intact Heart: regular rate and rhythm, S1, S2 normal, no murmur Lungs: clear to auscultation bilaterally Extremities: edema mild Wound: dressing dry  Lab Results: Recent Labs    07/11/22 1843 07/11/22 2006 07/11/22 2214 07/12/22 0311  WBC 21.8*  --   --  18.6*  HGB 15.2   < > 12.9* 14.8  HCT 41.7   < > 38.0* 41.4  PLT 182  --   --  159   < > = values in this interval not displayed.   BMET:  Recent Labs    07/11/22 1843 07/11/22 2006 07/11/22 2214 07/12/22 0311  NA 135   < > 139 137  K 5.4*   < > 4.5 4.5  CL 105  --   --  106  CO2 21*  --   --  22  GLUCOSE 125*  --   --  142*  BUN 18  --   --  22  CREATININE 1.10  --   --  1.43*  CALCIUM 7.4*  --   --  7.7*   < > =  values in this interval not displayed.    PT/INR:  Recent Labs    07/11/22 1402  LABPROT 16.1*  INR 1.3*   ABG    Component Value Date/Time   PHART 7.338 (L) 07/11/2022 2214   HCO3 23.0 07/11/2022 2214   TCO2 24 07/11/2022 2214   ACIDBASEDEF 3.0 (H) 07/11/2022 2214   O2SAT 96 07/11/2022 2214   CBG (last 3)  Recent Labs    07/12/22 0518 07/12/22 0620 07/12/22 0730  GLUCAP 157* 157* 162*   CXR: mild atelectasis  ECG: sinus, old LBBB, no acute changes.  Assessment/Plan: S/P Procedure(s) (LRB): CORONARY ARTERY BYPASS GRAFTING (CABG) X TWO, USING LEFT INTERNAL MAMMARY AND RIGHT LEG GREATER SAPHENOUS VEIN HARVESTED ENDOSCOPICALLY (N/A) AORTIC VALVE REPLACEMENT (AVR) USING INSPIRIS VALVE SIZE 25MM (N/A) TRANSESOPHAGEAL ECHOCARDIOGRAM (N/A)  POD 1 Hemodynamically stable on low dose NE. Wean off. Rhythm under pacer is sinus 60. Will continue atrial pacing at 80 for now until BP stable off NE. Hold Lopressor for now. HR was only in the 50's preop.  DC swan and arterial line.  Volume excess: Wt is 11 lbs over preop. Will start diuresis once off NE.   Mild bump in creat. Follow.  Glucose under good control. No DM and preop Hgb A1c was 5.3. Will continue SSI today  while on NE.  DC chest tubes after dangle.  IS, OOB.   LOS: 9 days    Gaye Pollack 07/12/2022

## 2022-07-13 ENCOUNTER — Inpatient Hospital Stay (HOSPITAL_COMMUNITY): Payer: Medicare Other

## 2022-07-13 DIAGNOSIS — I214 Non-ST elevation (NSTEMI) myocardial infarction: Secondary | ICD-10-CM | POA: Diagnosis not present

## 2022-07-13 LAB — CBC
HCT: 33.4 % — ABNORMAL LOW (ref 39.0–52.0)
Hemoglobin: 11.4 g/dL — ABNORMAL LOW (ref 13.0–17.0)
MCH: 31.5 pg (ref 26.0–34.0)
MCHC: 34.1 g/dL (ref 30.0–36.0)
MCV: 92.3 fL (ref 80.0–100.0)
Platelets: 119 10*3/uL — ABNORMAL LOW (ref 150–400)
RBC: 3.62 MIL/uL — ABNORMAL LOW (ref 4.22–5.81)
RDW: 14.3 % (ref 11.5–15.5)
WBC: 17.1 10*3/uL — ABNORMAL HIGH (ref 4.0–10.5)
nRBC: 0 % (ref 0.0–0.2)

## 2022-07-13 LAB — GLUCOSE, CAPILLARY
Glucose-Capillary: 120 mg/dL — ABNORMAL HIGH (ref 70–99)
Glucose-Capillary: 127 mg/dL — ABNORMAL HIGH (ref 70–99)
Glucose-Capillary: 127 mg/dL — ABNORMAL HIGH (ref 70–99)
Glucose-Capillary: 142 mg/dL — ABNORMAL HIGH (ref 70–99)
Glucose-Capillary: 163 mg/dL — ABNORMAL HIGH (ref 70–99)
Glucose-Capillary: 174 mg/dL — ABNORMAL HIGH (ref 70–99)

## 2022-07-13 LAB — BASIC METABOLIC PANEL
Anion gap: 6 (ref 5–15)
BUN: 41 mg/dL — ABNORMAL HIGH (ref 8–23)
CO2: 26 mmol/L (ref 22–32)
Calcium: 7.8 mg/dL — ABNORMAL LOW (ref 8.9–10.3)
Chloride: 97 mmol/L — ABNORMAL LOW (ref 98–111)
Creatinine, Ser: 2.19 mg/dL — ABNORMAL HIGH (ref 0.61–1.24)
GFR, Estimated: 30 mL/min — ABNORMAL LOW (ref >60)
Glucose, Bld: 124 mg/dL — ABNORMAL HIGH (ref 70–99)
Potassium: 4.2 mmol/L (ref 3.5–5.1)
Sodium: 129 mmol/L — ABNORMAL LOW (ref 135–145)

## 2022-07-13 LAB — URINALYSIS, ROUTINE W REFLEX MICROSCOPIC
Bilirubin Urine: NEGATIVE
Glucose, UA: NEGATIVE mg/dL
Ketones, ur: NEGATIVE mg/dL
Leukocytes,Ua: NEGATIVE
Nitrite: NEGATIVE
Protein, ur: NEGATIVE mg/dL
Specific Gravity, Urine: 1.023 (ref 1.005–1.030)
pH: 5 (ref 5.0–8.0)

## 2022-07-13 LAB — BRAIN NATRIURETIC PEPTIDE: B Natriuretic Peptide: 235.1 pg/mL — ABNORMAL HIGH (ref 0.0–100.0)

## 2022-07-13 LAB — SODIUM, URINE, RANDOM: Sodium, Ur: <10 mmol/L

## 2022-07-13 MED ORDER — INSULIN ASPART 100 UNIT/ML IJ SOLN
0.0000 [IU] | Freq: Three times a day (TID) | INTRAMUSCULAR | Status: DC
  Administered 2022-07-13: 2 [IU] via SUBCUTANEOUS
  Administered 2022-07-13: 4 [IU] via SUBCUTANEOUS
  Administered 2022-07-13: 2 [IU] via SUBCUTANEOUS
  Administered 2022-07-15: 4 [IU] via SUBCUTANEOUS
  Administered 2022-07-17: 2 [IU] via SUBCUTANEOUS

## 2022-07-13 MED ORDER — INSULIN ASPART 100 UNIT/ML IJ SOLN: 0.0000 [IU] | Freq: Three times a day (TID) | INTRAMUSCULAR | Status: DC

## 2022-07-13 NOTE — Progress Notes (Signed)
2 Days Post-Op Procedure(s) (LRB): CORONARY ARTERY BYPASS GRAFTING (CABG) X TWO, USING LEFT INTERNAL MAMMARY AND RIGHT LEG GREATER SAPHENOUS VEIN HARVESTED ENDOSCOPICALLY (N/A) AORTIC VALVE REPLACEMENT (AVR) USING INSPIRIS VALVE SIZE 25MM (N/A) TRANSESOPHAGEAL ECHOCARDIOGRAM (N/A) Subjective:  No complaints. Passing flatus but no BM yet.  Objective: Vital signs in last 24 hours: Temp:  [97.8 F (36.6 C)-99.5 F (37.5 C)] 98 F (36.7 C) (03/24 0000) Pulse Rate:  [65-92] 66 (03/24 0600) Cardiac Rhythm: Normal sinus rhythm;Bundle branch block (03/23 2000) Resp:  [16-34] 16 (03/24 0600) BP: (93-142)/(52-94) 93/52 (03/24 0500) SpO2:  [91 %-95 %] 92 % (03/24 0600) Arterial Line BP: (105-128)/(54-67) 116/55 (03/23 1234) Weight:  [120.8 kg] 120.8 kg (03/24 0500)  Hemodynamic parameters for last 24 hours: PAP: (23-43)/(5-16) 43/6 CO:  [4 L/min] 4 L/min CI:  [1.8 L/min/m2] 1.8 L/min/m2  Intake/Output from previous day: 03/23 0701 - 03/24 0700 In: 565.9 [I.V.:565.9] Out: 585 [Urine:585] Intake/Output this shift: No intake/output data recorded.  General appearance: alert and cooperative Neurologic: intact Heart: regular rate and rhythm, S1, S2 normal, no murmur Lungs: clear to auscultation bilaterally Extremities: edema mild Abd: BS present, soft, non-tender Wound: dressing dry  Lab Results: Recent Labs    07/12/22 1555 07/13/22 0415  WBC 16.6* 17.1*  HGB 12.9* 11.4*  HCT 38.0* 33.4*  PLT 124* 119*   BMET:  Recent Labs    07/12/22 1555 07/13/22 0415  NA 131* 129*  K 4.6 4.2  CL 100 97*  CO2 26 26  GLUCOSE 141* 124*  BUN 31* 41*  CREATININE 1.93* 2.19*  CALCIUM 7.6* 7.8*    PT/INR:  Recent Labs    07/11/22 1402  LABPROT 16.1*  INR 1.3*   ABG    Component Value Date/Time   PHART 7.338 (L) 07/11/2022 2214   HCO3 23.0 07/11/2022 2214   TCO2 24 07/11/2022 2214   ACIDBASEDEF 3.0 (H) 07/11/2022 2214   O2SAT 96 07/11/2022 2214   CBG (last 3)  Recent Labs     07/12/22 2016 07/12/22 2033 07/13/22 0014  GLUCAP 180* 163* 142*   CXR: mild atelectasis  Assessment/Plan: S/P Procedure(s) (LRB): CORONARY ARTERY BYPASS GRAFTING (CABG) X TWO, USING LEFT INTERNAL MAMMARY AND RIGHT LEG GREATER SAPHENOUS VEIN HARVESTED ENDOSCOPICALLY (N/A) AORTIC VALVE REPLACEMENT (AVR) USING INSPIRIS VALVE SIZE 25MM (N/A) TRANSESOPHAGEAL ECHOCARDIOGRAM (N/A)  POD 2  Hemodynamically stable in sinus rhythm but SBP high 90's so will hold off on beta blocker for now.  AKI: creat up further this am to 2.19. Follow. Hold off on diuresis until we see improvement. UO has been ok. Keep foley in to monitor.  DM: glucose under adequate control.  Leukocytosis likely inflammatory postop. Follow.  Continue IS,ambulation   LOS: 10 days    Gaye Pollack 07/13/2022

## 2022-07-13 NOTE — Progress Notes (Signed)
Patient ID: Craig Lawson, male   DOB: [birth year], 77 y.o.   MRN: [id number]  TCTS Evening Rounds:  Hemodynamically stable in sinus rhythm.  Sats 91% 4L [place].  UO 20-30/hr without diuretic. Repeat labs in am.

## 2022-07-13 NOTE — Consult Note (Signed)
NAME:  Craig Lawson, MRN:  [id number], DOB:  02/23/1946, LOS: 14 ADMISSION DATE:  07/03/2022, CONSULTATION DATE:  07/11/2022 REFERRING MD: Yolanda Manges, CHIEF COMPLAINT: Postcardiac surgery care.  History of Present Illness:  77 year old man who underwent uneventful bioprosthetic AVR and two-vessel bypass.  He was admitted 8 days ago for crescendo angina resulting in NSTEMI.  Found also to have moderate to severe aortic stenosis as well as two-vessel coronary disease.  Deemed to be at high risk for subsequent MACE, so kept in hospital for urgent revascularization.  Required Plavix washout. Jehovah's Witness.  Refuses blood and albumin.  Did receive preoperative erythropoietin.  Pertinent  Medical History   Past Medical History:  Diagnosis Date   Aortic stenosis 08/10/2015   Bilateral leg edema 08/22/2015   Bradycardia 06/01/2017   Cellulitis of left lower extremity 04/29/2017   Coronary artery disease involving native coronary artery 10/22/2017   DOE (dyspnea on exertion) 05/04/2017   Essential hypertension 08/13/2015   Heart murmur 04/29/2017   Kissing, osteophytes 08/22/2015   LBBB (left bundle branch block) 08/22/2015   Lipid disorder 11/30/2017   Lumbar facet joint pain 08/22/2015   Medicare annual wellness visit, subsequent 06/02/2016   Morbid obesity ([place]) 10/22/2017   Murmur 04/29/2017   Myopathy 09/02/2019   Formatting of this note might be different from the original. Secondary to high freq statin use.   Need for vaccination for Strep pneumoniae 03/27/2017   Non-ST elevation myocardial infarction (NSTEMI), subendocardial infarction, subsequent episode of care ([place]) 11/30/2017   Obesity, Class II, BMI 35-39.9 08/10/2015   OSA on CPAP 12/02/2017   Pure hypercholesterolemia 05/24/2015   Seborrheic keratoses 03/27/2017   Overview:  Of the right forehead.   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 Weight is up nearly 7 kg since preop Objective   Blood pressure (!) 92/57, pulse 63, temperature 98.2 F (36.8 C), temperature source Axillary, resp. rate 20, height 5\' 9"  (1.753 m), weight 120.8 kg, SpO2 93 %.        Intake/Output Summary (Last 24 hours) at 07/13/2022 1721 Last data filed at 07/13/2022 1600 Gross per 24 hour  Intake 1245.92 ml  Output 790 ml  Net 455.92 ml    Filed Weights   07/11/22 0500 07/12/22 0500 07/13/22 0500  Weight: 113.2 kg 118.3 kg 120.8 kg    Examination: General: Moderately obese.  In no distress HENT: Oral mucosae are moist Lungs: clear Cardiovascular: Clean midline sternotomy incision.  Heart sounds unremarkable chest tubes have been removed.   Abdomen: Soft. Extremities: Warm well-perfused generalized edema Neuro: No neurological deficits GU: Foley catheter in place with amber urine  Ancillary tests personally reviewed:   Creatinine creatinine 2.19, sodium 129 K: 4.2 ABG: 7.32/48.6/97  Assessment & Plan:  Critically ill following AVR CABG x 2 requiring postoperative ventilator management, fluid titration and titration of vasopressors. Coronary artery disease status post revascularization Severe aortic stenosis status post valve replacement Acute kidney injury Dyslipidemia Hypertension OSA intolerant of CPAP  Plan:  -Progressive ambulation and wean O2 to keep saturations greater than  92%. -Continue current multimodal pain control regimen. -Resume secondary prevention  -Creatinine has risen in the face of  diuresis although patient remains edematous on examination and is up nearly 7 kg since surgery. -Monitor creatinine but ultimately will likely need diuresis to maintain euvolemia suspect acute kidney injury related to bypass run rather than hypovolemia.  Will check BNP and urine electrolytes.  Monitor CVP  Best Practice (right click and "Reselect all SmartList Selections" daily)   Diet/type: Regular diet DVT prophylaxis: Enoxaparin.  Monitor if creatinine continues to worsen may need to switch to heparin. GI prophylaxis: H2B Lines: Central line and Arterial Line Foley:  Yes, and it is still needed Code Status:  full code Last date of multidisciplinary goals of care discussion [per cardiac surgery]  Kipp Brood, MD [facility] ICU Physician [place]  Pager: [phone] Mobile: [phone] After hours: [phone].

## 2022-07-14 ENCOUNTER — Inpatient Hospital Stay (HOSPITAL_COMMUNITY): Payer: Medicare Other

## 2022-07-14 DIAGNOSIS — R0609 Other forms of dyspnea: Secondary | ICD-10-CM | POA: Diagnosis not present

## 2022-07-14 DIAGNOSIS — Z951 Presence of aortocoronary bypass graft: Secondary | ICD-10-CM | POA: Diagnosis not present

## 2022-07-14 DIAGNOSIS — N179 Acute kidney failure, unspecified: Secondary | ICD-10-CM

## 2022-07-14 DIAGNOSIS — J9601 Acute respiratory failure with hypoxia: Secondary | ICD-10-CM

## 2022-07-14 DIAGNOSIS — R7989 Other specified abnormal findings of blood chemistry: Secondary | ICD-10-CM | POA: Diagnosis not present

## 2022-07-14 DIAGNOSIS — I447 Left bundle-branch block, unspecified: Secondary | ICD-10-CM | POA: Diagnosis not present

## 2022-07-14 DIAGNOSIS — I214 Non-ST elevation (NSTEMI) myocardial infarction: Secondary | ICD-10-CM | POA: Diagnosis not present

## 2022-07-14 DIAGNOSIS — J189 Pneumonia, unspecified organism: Secondary | ICD-10-CM | POA: Diagnosis not present

## 2022-07-14 LAB — ECHOCARDIOGRAM LIMITED
AR max vel: 2.7 cm2
AV Area VTI: 2.62 cm2
AV Area mean vel: 2.91 cm2
AV Mean grad: 11 mmHg
AV Peak grad: 23 mmHg
Ao pk vel: 2.4 m/s
Area-P 1/2: 4.6 cm2
Height: 69 in
S' Lateral: 4.3 cm
Weight: 4299.2 oz

## 2022-07-14 LAB — CBC
HCT: 29.5 % — ABNORMAL LOW (ref 39.0–52.0)
Hemoglobin: 10.4 g/dL — ABNORMAL LOW (ref 13.0–17.0)
MCH: 31.6 pg (ref 26.0–34.0)
MCHC: 35.3 g/dL (ref 30.0–36.0)
MCV: 89.7 fL (ref 80.0–100.0)
Platelets: 114 10*3/uL — ABNORMAL LOW (ref 150–400)
RBC: 3.29 MIL/uL — ABNORMAL LOW (ref 4.22–5.81)
RDW: 14.4 % (ref 11.5–15.5)
WBC: 12.6 10*3/uL — ABNORMAL HIGH (ref 4.0–10.5)
nRBC: 0 % (ref 0.0–0.2)

## 2022-07-14 LAB — BASIC METABOLIC PANEL
Anion gap: 6 (ref 5–15)
BUN: 52 mg/dL — ABNORMAL HIGH (ref 8–23)
CO2: 27 mmol/L (ref 22–32)
Calcium: 7.9 mg/dL — ABNORMAL LOW (ref 8.9–10.3)
Chloride: 95 mmol/L — ABNORMAL LOW (ref 98–111)
Creatinine, Ser: 1.9 mg/dL — ABNORMAL HIGH (ref 0.61–1.24)
GFR, Estimated: 36 mL/min — ABNORMAL LOW (ref >60)
Glucose, Bld: 104 mg/dL — ABNORMAL HIGH (ref 70–99)
Potassium: 3.9 mmol/L (ref 3.5–5.1)
Sodium: 128 mmol/L — ABNORMAL LOW (ref 135–145)

## 2022-07-14 LAB — PROCALCITONIN: Procalcitonin: 0.58 ng/mL

## 2022-07-14 LAB — TROPONIN I (HIGH SENSITIVITY): Troponin I (High Sensitivity): 1829 ng/L (ref <18)

## 2022-07-14 LAB — GLUCOSE, CAPILLARY
Glucose-Capillary: 112 mg/dL — ABNORMAL HIGH (ref 70–99)
Glucose-Capillary: 112 mg/dL — ABNORMAL HIGH (ref 70–99)
Glucose-Capillary: 115 mg/dL — ABNORMAL HIGH (ref 70–99)
Glucose-Capillary: 154 mg/dL — ABNORMAL HIGH (ref 70–99)

## 2022-07-14 LAB — EXPECTORATED SPUTUM ASSESSMENT W GRAM STAIN, RFLX TO RESP C

## 2022-07-14 LAB — MRSA NEXT GEN BY PCR, NASAL: MRSA by PCR Next Gen: NOT DETECTED

## 2022-07-14 MED ORDER — SODIUM CHLORIDE 0.9 % IV SOLN
2.0000 g | Freq: Three times a day (TID) | INTRAVENOUS | Status: DC
  Administered 2022-07-14 – 2022-07-15 (×2): 2 g via INTRAVENOUS
  Filled 2022-07-14 (×3): qty 10

## 2022-07-14 MED ORDER — VANCOMYCIN HCL IN DEXTROSE 1-5 GM/200ML-% IV SOLN
1000.0000 mg | INTRAVENOUS | Status: DC
  Administered 2022-07-14: 1000 mg via INTRAVENOUS
  Filled 2022-07-14: qty 200

## 2022-07-14 MED ORDER — ORAL CARE MOUTH RINSE: 15.0000 mL | OROMUCOSAL | Status: DC | PRN

## 2022-07-14 MED ORDER — MIDODRINE HCL 5 MG PO TABS
5.0000 mg | ORAL_TABLET | Freq: Three times a day (TID) | ORAL | Status: DC
  Administered 2022-07-14 – 2022-07-15 (×5): 5 mg via ORAL
  Filled 2022-07-14 (×5): qty 1

## 2022-07-14 MED ORDER — POTASSIUM CHLORIDE CRYS ER 20 MEQ PO TBCR
20.0000 meq | EXTENDED_RELEASE_TABLET | Freq: Every day | ORAL | Status: DC
  Administered 2022-07-14 – 2022-07-22 (×9): 20 meq via ORAL
  Filled 2022-07-14 (×9): qty 1

## 2022-07-14 MED ORDER — BUMETANIDE 0.25 MG/ML IJ SOLN
1.0000 mg | Freq: Once | INTRAMUSCULAR | Status: AC
  Administered 2022-07-14: 1 mg via INTRAVENOUS
  Filled 2022-07-14: qty 4

## 2022-07-14 MED ORDER — PERFLUTREN LIPID MICROSPHERE
1.0000 mL | INTRAVENOUS | Status: AC | PRN
  Administered 2022-07-14: 2 mL via INTRAVENOUS

## 2022-07-14 NOTE — Progress Notes (Addendum)
Feeling more SOB like he can't catch his breath. RN noticed he's more sweaty than earlier. Had brown sputum this morning per RN.  BP 117/78   Pulse 72   Temp 98.5 F (36.9 C) (Oral)   Resp 20   Ht 5\' 9"  (1.753 m)   Wt 121.9 kg   SpO2 94%   BMI 39.68 kg/m  Sitting up in bed in NAD Diaphoretic all over Breathing comfortably on , mildly increased WOB, not using accessory muscles. No conversational dyspnea.  S1S2, RRR, NSR on tele  CXR: LLL opacified still, silhouetted hemidiaphragm. EKG: NSR, LBBB.  III & AVF now upright. QRS prolonging. Larger ST elevations (now 22mm in V2, 61mm in V3, V4> still less than 24% of S wave depth)  Plan: Check PCT, trop Trial of bipap Collect sputum culture Low threshold to start empiric antibiotics for [place], DO 07/14/22 3:07 PM [facility]  For contact information, see Amion. If no response to pager, please call PCCM consult pager. After hours, 7PM- 7AM, please call Elink.    PCT increased> starting HAP antibiotics.  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 Will recheck at 5pm and discuss with TCTS.  Julian Hy, DO 07/14/22 4:11 PM [facility]  For contact information, see Amion. If no response to pager, please call PCCM consult pager. After hours, 7PM- 7AM, please call Elink.    STAT echo ordered.  Julian Hy, DO 07/14/22 4:41 PM [facility]   BP (!) 102/57 (BP Location: Right Arm)   Pulse 64   Temp 97.6 F (36.4 C) (Axillary)   Resp 17   Ht 5\' 9"  (1.753 m)   Wt 121.9 kg   SpO2 99%   BMI 39.68 kg/m  Reexamined at bedside Breathing comfortably on bipap, saturating in the upper 90s, breathing about 17 times per min. Min light gray secretions in suction catheter. Answering questions appropriately, awake and alert  Wife updated at bedside. We discussed antibiotics and her concern for renal failure from antibiotics. We discussed the  hope that we can get him off vanc if his MRSA nares is negative, although he would still require GPC coverage if he remains on aztreonam. We will discuss with pharmacy if MRSA nares are negative.   Julian Hy, DO 07/14/22 6:06 PM [facility]

## 2022-07-14 NOTE — Discharge Summary (Signed)
[address]       [place], [address]             [phone]    Physician Discharge Summary  Patient ID: Craig Lawson MRN: [id number] DOB/AGE: 78-Jan-1947 78 y.o.  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   Severe aortic stenosis 07/04/2022   NSTEMI (non-ST elevated myocardial infarction) 07/03/2022   Myopathy 09/02/2019   CAD S/P percutaneous coronary angioplasty 07/28/2019   Mixed hyperlipidemia 12/04/2017   OSA on CPAP 12/02/2017   Statin intolerance 11/30/2017   Lipid disorder 11/30/2017   Non-ST elevation myocardial infarction (NSTEMI), subendocardial infarction, subsequent episode of care 11/30/2017   History of transient ischemic attack (TIA) 10/30/2017   Coronary artery disease involving native coronary artery 10/22/2017   Morbid obesity 10/22/2017   Mild aortic stenosis 06/01/2017   Bradycardia 06/01/2017   DOE (dyspnea on exertion) 05/04/2017   Murmur 04/29/2017   Cellulitis of left lower extremity 04/29/2017   Thrombocytopenia 04/29/2017   Heart murmur 04/29/2017   Seborrheic keratoses 03/27/2017   Need for vaccination for Strep pneumoniae 03/27/2017   TIA (transient ischemic attack) 12/19/2016   Medicare annual wellness visit, subsequent 06/02/2016   Bilateral leg edema 08/22/2015   Kissing, osteophytes 08/22/2015   LBBB (left bundle branch block) 08/22/2015   Lumbar facet joint pain 08/22/2015   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   Essential hypertension 08/13/2015   Mild aortic regurgitation 08/10/2015   Obesity, Class II, BMI 35-39.9 08/10/2015   Venous insufficiency 08/10/2015   Aortic stenosis, mild 08/10/2015   Pure hypercholesterolemia  05/24/2015     Discharge Diagnoses:  Patient Active Problem List   Diagnosis Date Noted   Hyponatremia 07/16/2022   Pneumonia of left lower lobe due to infectious organism 07/15/2022   Troponin level elevated 07/15/2022   Acute respiratory failure with hypoxia 07/15/2022   S/P CABG x 2 07/11/2022   Severe aortic stenosis 07/04/2022   NSTEMI (non-ST elevated myocardial infarction) 07/03/2022   Myopathy 09/02/2019   CAD S/P percutaneous coronary angioplasty 07/28/2019   Mixed hyperlipidemia 12/04/2017   OSA on CPAP 12/02/2017   Statin intolerance 11/30/2017   Lipid disorder 11/30/2017   Non-ST elevation myocardial infarction (NSTEMI), subendocardial infarction, subsequent episode of care 11/30/2017   History of transient ischemic attack (TIA) 10/30/2017   Coronary artery disease involving native coronary artery 10/22/2017   Morbid obesity 10/22/2017   Mild aortic stenosis 06/01/2017   Bradycardia 06/01/2017   DOE (dyspnea on exertion) 05/04/2017   Murmur 04/29/2017   Cellulitis of left lower extremity 04/29/2017   Thrombocytopenia 04/29/2017   Heart murmur 04/29/2017   Seborrheic keratoses 03/27/2017   Need for vaccination for Strep pneumoniae 03/27/2017   TIA (transient ischemic attack) 12/19/2016   Medicare annual wellness visit, subsequent 06/02/2016   Bilateral leg edema 08/22/2015   Kissing, osteophytes 08/22/2015   LBBB (left bundle branch block) 08/22/2015   Lumbar facet joint pain 08/22/2015   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   Essential hypertension 08/13/2015   Mild aortic regurgitation 08/10/2015   Obesity, Class II, BMI 35-39.9 08/10/2015   Venous insufficiency 08/10/2015   Aortic stenosis, mild 08/10/2015   Pure hypercholesterolemia 05/24/2015  Discharged Condition: good  [facility] Cardiologist: Shirlee More, MD    History of Present Illness:    The patient is a 77 year old male followed by Dr. Bettina Gavia with cardiology with a significant  cardiac history dating back to 2019 who has had multiple percutaneous procedures.  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  Recently has developed progressive angina symptoms and has been reevaluated including L HC/RHC and repeat echocardiogram.  He now is noted to have severe LM stenosis.  He was not felt to be amenable to further percutaneous procedures and cardiothoracic surgical consultation was obtained.  Dr. Lavonna Monarch evaluated the patient and all relevant studies and recommended proceeding with AVR/CABG.  Hospital course:  Following full diagnostic evaluation and medical stabilization he was felt to be stable to proceed and on 07/11/2022 he was taken the operating room at which time he underwent CABG x 2 and aortic valve replacement.  He tolerated the procedure well and was taken to the surgical intensive care unit in stable condition.  Postoperative hospital course:  The patient was extubated using standard post cardiac surgical protocols without difficulty.  He has remained hemodynamically stable but did require norepinephrine in the early postoperative period, This was weaned over time without difficulty.  He did have an expected postoperative volume excess with some acute kidney injury limiting the use of diuretics initially.  All routine lines, monitors and drainage devices have been discontinued in the standard fashion.  The critical care medicine service was consulted to assist with ICU management.  He did develop a moderate hyponatremia which is being monitored clinically.  He does have an expected acute blood loss anemia which is being monitored clinically.  He is not at the transfusion threshold.  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  He has been placed on iron  supplement.  Blood sugars have been under adequate control using standard protocols.  He did develop a reactive leukocytosis but white blood cell count is trending towards normal over time.  He was placed on vancomycin and aztreonam for pulmonary coverage  and later switched to Maxipime for a 5-day course for HCAP. He has not had any significant fevers.  He did have a postoperative expected volume overload but is responding well to diuretics.  Early operatively he developed an AKI but BUN and creatinine continues to trend towards normal.  Creatinine peaked at 2.19. He remained hemodynamically stable while atrial paced. He was transitioned to oral amiodarone and set to back up pacing.  He maintained NSR overnight.  His pacing wires were removed without difficulty.  He developed shortness of breath.  He had ultrasound-guided left thoracentesis on 3/29 with 626ml thin bloody fluid removed. He was transferred to 4E on POD8.  He had return of normal bowel function. Diuresis continued for LE edema and persistent shortness of breath.  He will additionally be sent home on a short course of diuretics.  Follow up CXR was obtained on and is showing ongoing improvement in aeration.  The patient does have a history of obstructive sleep apnea and desaturates at night.  He previously used CPAP approximately 6 or 7 years ago but stopped because he was unable to sleep.  We will arrange home oxygen at night and attempt to arrange a sleep study in  the future through the [facility] pulmonary office.  Hopefully he will be able to tolerate CPAP in the future with some of the newer devices available..  Overall, at the time of discharge the patient is felt to be quite stable.   Consults: cardiology and pulmonary/intensive care  Significant Diagnostic Studies:  DG Chest 2 View  Result Date: 07/22/2022 CLINICAL DATA:  S/p AVR EXAM: CHEST - 2 VIEW COMPARISON:  CXR 07/20/22 FINDINGS: Small bilateral pleural effusions. Hazy opacity at the  left lung base has slightly increased from prior exam. Status post median sternotomy. Cardiac and mediastinal contours are unchanged. No radiographically apparent displaced rib fractures. Visualized upper abdomen is unremarkable. There are prominent bilateral interstitial opacities which could represent pulmonary venous congestion or atypical infection. IMPRESSION: 1. Small bilateral pleural effusions. 2. Hazy opacity at the left lung base has slightly increased from prior exam. This could represent atelectasis or infection. Electronically Signed   By: Marin Roberts M.D.   On: 07/22/2022 08:35   DG CHEST PORT 1 VIEW  Result Date: 07/20/2022 CLINICAL DATA:  Shortness of breath EXAM: PORTABLE CHEST 1 VIEW COMPARISON:  07/18/2022 FINDINGS: Cardiac shadow is enlarged. Postsurgical changes are noted. Lungs are hypoinflated but clear. No bony abnormality is noted. IMPRESSION: No active disease. Electronically Signed   By: Inez Catalina M.D.   On: 07/20/2022 10:41   ECHO INTRAOPERATIVE TEE  Result Date: 07/20/2022  *INTRAOPERATIVE TRANSESOPHAGEAL REPORT *  Patient Name:   XAVI BRICH Date of Exam: 07/11/2022 Medical Rec #:  [id number]     Height:       69.0 in Accession #:    [id number]    Weight:       249.6 lb Date of Birth:  Oct 09, 1945      BSA:          2.27 m Patient Age:    53 years      BP:           134/69 mmHg Patient Gender: M             HR:           59 bpm. Exam Location:  Inpatient Transesophogeal exam was perform intraoperatively during surgical procedure. Patient was closely monitored under general anesthesia during the entirety of examination. Indications:     I25.10 CAD Performing Phys: [id number] Coralie Common Complications: No known complications during this procedure. POST-OP IMPRESSIONS _ Left Ventricle: has mildly reduced systolic function, with an ejection fraction of 50%. The wall motion is septal dyssynchrony from pacing, inferior wall mildly hypokinetic relative to anterior wall. _ Right  Ventricle: normal function. The cavity was normal. The wall motion is normal. _ Aorta: there is no dissection present in the aorta. 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 The gradient recorded across the prosthetic valve is within the expected range, measuring 4 mmHg. _ Mitral Valve: The mitral valve appears unchanged from pre-bypass. _ Tricuspid Valve: Mild regurgitation post repair. PRE-OP FINDINGS  Left Ventricle: The left ventricle has low normal systolic function, with an ejection fraction of 50-55%. The cavity size was normal. No evidence of left ventricular regional wall motion abnormalities. There is moderate concentric left ventricular hypertrophy. Right Ventricle: The right ventricle has normal systolic function. The cavity was normal. There is no increase in right ventricular wall thickness. Left Atrium: Left atrial size was not assessed. No left atrial/left atrial appendage thrombus was detected. Left atrial appendage velocity is  normal at greater than 40 cm/s. Right Atrium: Right atrial size was not assessed. Interatrial Septum: No atrial level shunt detected by color flow Doppler. There is no evidence of a patent foramen ovale. Pericardium: There is no evidence of pericardial effusion. Mitral Valve: The mitral valve is normal in structure. Mitral valve regurgitation is mild by color flow Doppler. The MR jet is centrally-directed. There is No evidence of mitral stenosis. Tricuspid Valve: The tricuspid valve was normal in structure. Tricuspid valve regurgitation was not visualized by color flow Doppler. No evidence of tricuspid stenosis is present. Aortic Valve: The aortic valve is tricuspid Aortic valve regurgitation is mild by color flow Doppler. The jet is posteriorly-directed. There is moderate stenosis of the aortic valve, with a calculated valve area of 1.38 cm. There is severe thickening and severe calcifcation present on the aortic  valve left coronary and non-coronary cusps with severely decreased mobility and there is moderate thickening and mild calcification present on the aortic valve right coronary cusp with mildly decreased mobility. Pulmonic Valve: The pulmonic valve was normal in structure, with normal. No evidence of pumonic stenosis. Pulmonic valve regurgitation is not visualized by color flow Doppler. Aorta: There is evidence of plaque in the descending aorta; Grade I, measuring 1-70mm in size. There is evidence of a dissection in the none. Shunts: There is no evidence of an atrial septal defect. +--------------+--------++ LEFT VENTRICLE         +--------------+--------++ PLAX 2D                +--------------+--------++ LVOT diam:    2.20 cm  +--------------+--------++ LVOT Area:    3.80 cm +--------------+--------++                        +--------------+--------++ +------------------+------------++ AORTIC VALVE                   +------------------+------------++ AV Area (Vmax):   1.55 cm     +------------------+------------++ AV Area (Vmean):  1.44 cm     +------------------+------------++ AV Area (VTI):    1.38 cm     +------------------+------------++ AV Vmax:          286.33 cm/s  +------------------+------------++ AV Vmean:         198.967 cm/s +------------------+------------++ AV VTI:           0.610 m      +------------------+------------++ AV Peak Grad:     32.8 mmHg    +------------------+------------++ AV Mean Grad:     20.7 mmHg    +------------------+------------++ LVOT Vmax:        116.85 cm/s  +------------------+------------++ LVOT Vmean:       75.550 cm/s  +------------------+------------++ LVOT VTI:         0.221 m      +------------------+------------++ LVOT/AV VTI ratio:0.36         +------------------+------------++ +-------------+---------++ MITRAL VALVE           +--------------+-------+ +-------------+---------++ SHUNTS                 MV Peak grad:4.1 mmHg  +--------------+-------+ +-------------+---------++ Systemic VTI: 0.22 m  MV Mean grad:1.0 mmHg  +--------------+-------+ +-------------+---------++ Systemic Diam:2.20 cm MV Vmax:     1.01 m/s  +--------------+-------+ +-------------+---------++ MV Vmean:    45.1 cm/s +-------------+---------++ MV VTI:      0.25 m    +-------------+---------++  Oleta Mouse MD Electronically signed by Oleta Mouse MD Signature Date/Time: 07/20/2022/8:52:48 AM    Final  DG Chest Port 1 View  Result Date: 07/18/2022 CLINICAL DATA:  Status post aortic valve repair, thoracentesis EXAM: PORTABLE CHEST 1 VIEW COMPARISON:  Previous studies including the examination done earlier today FINDINGS: Transverse diameter of heart is increased. There is prosthetic aortic valve. There are no signs of alveolar pulmonary edema. Linear densities are seen in left upper and right lower lung fields suggesting subsegmental atelectasis. There is increased density in left lower lung field obscuring the left hemidiaphragm with interval improvement. There is no pneumothorax. IMPRESSION: Possible decrease in left pleural effusion. There is no pneumothorax. Electronically Signed   By: Elmer Picker M.D.   On: 07/18/2022 16:59   DG CHEST PORT 1 VIEW  Result Date: 07/18/2022 CLINICAL DATA:  Status post AVR EXAM: PORTABLE CHEST 1 VIEW COMPARISON:  Chest radiograph 07/17/2022 FINDINGS: Status post median sternotomy. Postsurgical changes from aortic valve repair. Low lung volumes. Unchanged cardiac and mediastinal contours. Redemonstrated are diffusely increased interstitial opacities and a more focal opacity at the left lung base, unchanged from prior exam. No radiographically apparent displaced rib fractures. Visualized upper abdomen is unremarkable. Degenerative changes of the bilateral AC joints. IMPRESSION: Unchanged diffusely increased interstitial opacities and a more focal  opacity at the left lung base. Electronically Signed   By: Marin Roberts M.D.   On: 07/18/2022 08:40   DG CHEST PORT 1 VIEW  Result Date: 07/17/2022 CLINICAL DATA:  Status post AVR. EXAM: PORTABLE CHEST 1 VIEW COMPARISON:  07/16/2022 FINDINGS: RIGHT IJ sheath remains in place. Prior median sternal. There is persistent opacity at the LEFT lung base which obscures the hemidiaphragm, stable in appearance. Minimal subsegmental atelectasis is present at the RIGHT lung base. No pneumothorax. IMPRESSION: 1. Stable appearance of LEFT basilar opacity. 2. Minimal subsegmental atelectasis at the RIGHT lung base. Electronically Signed   By: Nolon Nations M.D.   On: 07/17/2022 07:40   DG CHEST PORT 1 VIEW  Result Date: 07/16/2022 CLINICAL DATA:  Status post aortic valve replacement EXAM: PORTABLE CHEST 1 VIEW COMPARISON:  Chest x-ray dated July 15, 2022 FINDINGS: Cardiac and mediastinal contours are unchanged. Stable position of right IJ line sheath. Unchanged diffuse interstitial opacities and bibasilar atelectasis. Probable trace bilateral pleural effusions. No evidence of pneumothorax. IMPRESSION: Unchanged diffuse interstitial opacities, likely pulmonary edema. Electronically Signed   By: Yetta Glassman M.D.   On: 07/16/2022 08:08   DG CHEST PORT 1 VIEW  Result Date: 07/15/2022 CLINICAL DATA:  Respiratory distress EXAM: PORTABLE CHEST 1 VIEW COMPARISON:  07/14/2022 FINDINGS: Cardiac shadow remains enlarged. Postsurgical changes are again seen. Right jugular sheath is noted but kinked at the skin surface stable from the prior exam. Overall inspiratory effort is poor with basilar atelectasis. Crowding of the vascular markings is again noted. No bony abnormality is seen. IMPRESSION: Stable appearance of the chest when compare with the prior exam. Electronically Signed   By: Inez Catalina M.D.   On: 07/15/2022 22:42   ECHOCARDIOGRAM LIMITED  Result Date: 07/14/2022    ECHOCARDIOGRAM LIMITED REPORT   Patient  Name:   Craig BELARDE Date of Exam: 07/14/2022 Medical Rec #:  [id number]     Height:       69.0 in Accession #:    [id number]    Weight:       268.7 lb Date of Birth:  06/28/1945      BSA:          2.342 m Patient Age:    26 years  BP:           102/57 mmHg Patient Gender: M             HR:           65 bpm. Exam Location:  Inpatient Procedure: 2D Echo, Intracardiac Opacification Agent, Cardiac Doppler and Color            Doppler Indications:    Dyspnea  History:        Patient has prior history of Echocardiogram examinations, most                 recent 07/05/2022. Prior CABG and Prior Cardiac Surgery.  Sonographer:    Marella Chimes Referring Phys: [id number] [place] 1. Technically difficult study with very limited visualization of cardiac structures. 2. On contrasted views, LV function appears normal with an estimated EF of 55%. There is paradoxical motion of the interventricular septum due to underlying LBBB. 3. There is a well seated bioprosthetic valve in the aortic position with no signs of paravalvular regurgitation. 4. No pericardial effusion visualized. FINDINGS  Left Ventricle: Left ventricular ejection fraction, by estimation, is 50 to 55%. The left ventricle has low normal function. Abnormal (paradoxical) septal motion consistent with post-operative status and abnormal (paradoxical) septal motion, consistent with left bundle branch block. Left ventricular diastolic parameters are consistent with Grade II diastolic dysfunction (pseudonormalization). Left ventricular diastolic function could not be evaluated due to indeterminate diastolic function. Right Ventricle: The right ventricular size is normal. No increase in right ventricular wall thickness. Right ventricular systolic function is low normal. Left Atrium: Left atrial size was not well visualized. Right Atrium: Right atrial size was not well visualized. Pericardium: There is no evidence of pericardial effusion. Mitral Valve: The  mitral valve was not well visualized. Tricuspid Valve: The tricuspid valve is not well visualized. Aortic Valve: The aortic valve has been repaired/replaced. Aortic valve mean gradient measures 11.0 mmHg. Aortic valve peak gradient measures 23.0 mmHg. Aortic valve area, by VTI measures 2.62 cm. There is a bioprosthetic valve present in the aortic position. Pulmonic Valve: The pulmonic valve was not assessed. Venous: The inferior vena cava was not well visualized. LEFT VENTRICLE PLAX 2D LVIDd:         5.30 cm   Diastology LVIDs:         4.30 cm   LV e' medial:    3.26 cm/s LV PW:         1.10 cm   LV E/e' medial:  27.9 LV IVS:        1.10 cm   LV e' lateral:   5.00 cm/s LVOT diam:     2.40 cm   LV E/e' lateral: 18.2 LV SV:         112 LV SV Index:   48 LVOT Area:     4.52 cm  RIGHT VENTRICLE RV S prime:     7.62 cm/s TAPSE (M-mode): 1.0 cm LEFT ATRIUM           Index        RIGHT ATRIUM           Index LA Vol (A4C): 43.0 ml 18.36 ml/m  RA Area:     14.60 cm                                    RA Volume:   31.50 ml  13.45 ml/m  AORTIC VALVE AV Area (Vmax):    2.70 cm AV Area (Vmean):   2.91 cm AV Area (VTI):     2.62 cm AV Vmax:           240.00 cm/s AV Vmean:          150.000 cm/s AV VTI:            0.427 m AV Peak Grad:      23.0 mmHg AV Mean Grad:      11.0 mmHg LVOT Vmax:         143.00 cm/s LVOT Vmean:        96.400 cm/s LVOT VTI:          0.247 m LVOT/AV VTI ratio: 0.58  AORTA Ao Root diam: 3.50 cm Ao Asc diam:  3.40 cm MITRAL VALVE MV Area (PHT): 4.60 cm    SHUNTS MV Decel Time: 165 msec    Systemic VTI:  0.25 m MV E velocity: 90.80 cm/s  Systemic Diam: 2.40 cm MV A velocity: 83.40 cm/s MV E/A ratio:  1.09 Aditya Sabharwal Electronically signed by Hebert Soho Signature Date/Time: 07/14/2022/6:00:23 PM    Final    DG CHEST PORT 1 VIEW  Result Date: 07/14/2022 CLINICAL DATA:  Hypoxia.  Worsening chest pain on left side. EXAM: PORTABLE CHEST 1 VIEW COMPARISON:  07/13/2022. FINDINGS: 1424 hours.  Right IJ approach vascular sheath remains in place. Stable postoperative changes of median sternotomy and aortic valve replacement. Low lung volumes accentuate the pulmonary vasculature and cardiomediastinal silhouette. Unchanged linear atelectasis throughout the left-greater-than-right lungs. Probable small left pleural effusion. No pneumothorax. IMPRESSION: Low lung volumes with scattered linear atelectasis throughout the left-greater-than-right lungs and probable small left pleural effusion. Electronically Signed   By: Emmit Alexanders M.D.   On: 07/14/2022 14:33   DG Chest Port 1 View  Result Date: 07/13/2022 CLINICAL DATA:  CABG. EXAM: PORTABLE CHEST 1 VIEW COMPARISON:  07/12/2022 FINDINGS: The cardio pericardial silhouette is enlarged. Low volume film with asymmetric elevation right hemidiaphragm. There is pulmonary vascular congestion without overt pulmonary edema. Subsegmental atelectasis noted bilaterally, left greater than right. Chest tube and mediastinal drain have been removed in the interval. Pulmonary artery catheter has been removed with right IJ sheath remaining in place. IMPRESSION: 1. Interval removal of chest tube and mediastinal drain. No pneumothorax. 2. Low volume film with bibasilar atelectasis. Electronically Signed   By: Misty Stanley M.D.   On: 07/13/2022 08:16   DG Chest Port 1 View  Result Date: 07/12/2022 CLINICAL DATA:  [id number], recent two-vessel CABG.  AVR.  [id number]. EXAM: PORTABLE CHEST 1 VIEW COMPARISON:  Portable chest yesterday at 1:12 p.m. FINDINGS: 6:02 a.m. ETT/NGT interval removal. Left chest tube remains with tip in the lateral apical area. A mediastinal drain also remains in place in the midline. Right IJ Swan-Ganz catheter has been advanced into the proximal right main pulmonary artery. CABG changes and aortic valve prosthesis with intact median sternotomy sutures. The aorta is tortuous and ectatic with patchy calcification, stable mediastinal configuration. Stable  cardiomegaly. There are hypoinflated lungs with lower zonal linear perihilar atelectatic bands but no convincing focal pneumonia. There is no substantial pleural effusion or visible pneumothorax. The overall aeration is not significantly changed postextubation. No new abnormality. IMPRESSION: 1. ETT/NGT interval removal. 2. Right IJ Swan-Ganz catheter has been advanced into the proximal right main pulmonary artery. 3. Stable cardiomegaly and CABG changes. 4. Low lung volumes with perihilar atelectasis. No new abnormality. Electronically Signed  By: Telford Nab M.D.   On: 07/12/2022 07:52   DG Chest Port 1 View  Result Date: 07/11/2022 CLINICAL DATA:  Post AVR EXAM: PORTABLE CHEST 1 VIEW COMPARISON:  Portable exam 1312 hours compared to preoperative study of 07/10/2022 FINDINGS: Tip of endotracheal tube projects 5.4 cm above carina. Nasogastric tube extends into stomach. Mediastinal drain and LEFT thoracostomy tube present. RIGHT jugular Swan-Ganz catheter with tip projecting over proximal RIGHT pulmonary artery. Slightly prominent cardiac silhouette post AVR. Atherosclerotic calcification aorta. Bibasilar atelectasis without pleural effusion or pneumothorax. IMPRESSION: Expected postoperative changes post AVR. Electronically Signed   By: Lavonia Dana M.D.   On: 07/11/2022 13:22   EP STUDY  Result Date: 07/11/2022 See surgical note for result.  DG Chest 2 View  Result Date: 07/10/2022 CLINICAL DATA:  Aortic stenosis.  Preop. EXAM: CHEST - 2 VIEW COMPARISON:  July 03, 2022. FINDINGS: Stable cardiomediastinal silhouette. Both lungs are clear. The visualized skeletal structures are unremarkable. IMPRESSION: No active cardiopulmonary disease. Electronically Signed   By: Marijo Conception M.D.   On: 07/10/2022 10:09   VAS US DOPPLER PRE CABG  Result Date: 07/08/2022 PREOPERATIVE VASCULAR EVALUATION Patient Name:  PREET NEWNHAM  Date of Exam:   07/08/2022 Medical Rec #: [id number]      Accession #:     [id number] Date of Birth: 11-08-1945       Patient Gender: M Patient Age:   79 years Exam Location:  [facility] Procedure:      VAS US DOPPLER PRE CABG Referring Phys: Coralie Common --------------------------------------------------------------------------------  Indications:      TIA and pre-CABG. Risk Factors:     Hyperlipidemia, coronary artery disease. Comparison Study: No prior studies. Performing Technologist: McKayla Maag RVT, [place]  Examination Guidelines: A complete evaluation includes B-mode imaging, spectral Doppler, color Doppler, and power Doppler as needed of all accessible portions of each vessel. Bilateral testing is considered an integral part of a complete examination. Limited examinations for reoccurring indications may be performed as noted.  Right Carotid Findings: +---------+--------+-------+--------+------------------------+-----------------+          PSV cm/sEDV    StenosisDescribe                Comments                           cm/s                                                     +---------+--------+-------+--------+------------------------+-----------------+ CCA Prox 85      7                                      intimal                                                                   thickening        +---------+--------+-------+--------+------------------------+-----------------+ CCA      56      12  intimal           Distal                                                  thickening        +---------+--------+-------+--------+------------------------+-----------------+ ICA Prox 72      6      1-39%   heterogenous and                                                          irregular                                 +---------+--------+-------+--------+------------------------+-----------------+ ICA Mid  52      11                                                        +---------+--------+-------+--------+------------------------+-----------------+ ICA      61      12                                                       Distal                                                                    +---------+--------+-------+--------+------------------------+-----------------+ ECA      100     10                                                       +---------+--------+-------+--------+------------------------+-----------------+ +----------+--------+-------+----------------+------------+           PSV cm/sEDV cmsDescribe        Arm Pressure +----------+--------+-------+----------------+------------+ Subclavian125            Multiphasic, WNL             +----------+--------+-------+----------------+------------+ +---------+--------+--+--------+-+---------+ VertebralPSV cm/s26EDV cm/s8Antegrade +---------+--------+--+--------+-+---------+ Left Carotid Findings: +----------+-------+--------+--------+-----------------------+-----------------+           PSV    EDV cm/sStenosisDescribe               Comments                    cm/s                                                            +----------+-------+--------+--------+-----------------------+-----------------+  CCA Prox  110    17                                     intimal                                                                   thickening        +----------+-------+--------+--------+-----------------------+-----------------+ CCA Distal93     14                                     intimal                                                                   thickening        +----------+-------+--------+--------+-----------------------+-----------------+ ICA Prox  33     9       1-39%   homogeneous and                                                           irregular                                 +----------+-------+--------+--------+-----------------------+-----------------+ ICA Mid   60     19                                                       +----------+-------+--------+--------+-----------------------+-----------------+ ICA Distal59     16                                                       +----------+-------+--------+--------+-----------------------+-----------------+ ECA       89     12                                                       +----------+-------+--------+--------+-----------------------+-----------------+  +----------+--------+--------+----------------+------------+ SubclavianPSV cm/sEDV cm/sDescribe        Arm Pressure +----------+--------+--------+----------------+------------+           111             Multiphasic, WNL             +----------+--------+--------+----------------+------------+ +---------+--------+--+--------+---------+  VertebralPSV cm/s24EDV cm/sAntegrade +---------+--------+--+--------+---------+  ABI Findings: +---------+------------------+-----+---------+--------+ Right    Rt Pressure (mmHg)IndexWaveform Comment  +---------+------------------+-----+---------+--------+ Brachial 129                    triphasic         +---------+------------------+-----+---------+--------+ PTA      163               1.26 triphasic         +---------+------------------+-----+---------+--------+ DP       157               1.22 triphasic         +---------+------------------+-----+---------+--------+ Galvin Proffer               0.95 Normal            +---------+------------------+-----+---------+--------+ +---------+------------------+-----+---------+---------------------------------+ Left     Lt Pressure (mmHg)IndexWaveform Comment                           +---------+------------------+-----+---------+---------------------------------+ Brachial                        triphasicNo pressure obtianed due to  IV                                             placement.                        +---------+------------------+-----+---------+---------------------------------+ PTA      162               1.26 triphasic                                  +---------+------------------+-----+---------+---------------------------------+ DP       148               1.15 triphasic                                  +---------+------------------+-----+---------+---------------------------------+ Great Toe136               1.05 Normal                                     +---------+------------------+-----+---------+---------------------------------+ +-------+---------------+----------------+ ABI/TBIToday's ABI/TBIPrevious ABI/TBI +-------+---------------+----------------+ Right  1.26/ 0.95                      +-------+---------------+----------------+ Left   1.26/ 1.05                      +-------+---------------+----------------+  Right Doppler Findings: +--------+--------+-----+---------+--------+ Site    PressureIndexDoppler  Comments +--------+--------+-----+---------+--------+ [id number]          triphasic         +--------+--------+-----+---------+--------+ Radial               triphasic         +--------+--------+-----+---------+--------+ Ulnar                triphasic         +--------+--------+-----+---------+--------+  Left Doppler Findings: +--------+--------+-----+---------+-----------------------------------------+ Site  PressureIndexDoppler  Comments                                  +--------+--------+-----+---------+-----------------------------------------+ Brachial             triphasicNo pressure obtianed due to IV placement. +--------+--------+-----+---------+-----------------------------------------+ Radial               triphasic                                           +--------+--------+-----+---------+-----------------------------------------+ Ulnar                triphasic                                          +--------+--------+-----+---------+-----------------------------------------+   Summary: Right Carotid: Velocities in the right ICA are consistent with a 1-39% stenosis. Left Carotid: Velocities in the left ICA are consistent with a 1-39% stenosis. Vertebrals:  Bilateral vertebral arteries demonstrate antegrade flow. Subclavians: Normal flow hemodynamics were seen in bilateral subclavian              arteries. Right ABI: Resting right ankle-brachial index is within normal range. The right toe-brachial index is normal. Left ABI: Resting left ankle-brachial index is within normal range. The left toe-brachial index is normal. Right Upper Extremity: Doppler waveforms remain within normal limits with right radial compression. Doppler waveforms decrease <50% with right ulnar compression. Left Upper Extremity: Doppler waveforms decrease <50% w left radial compression. Doppler waveforms remain within normal limits with left ulnar compression.  Electronically signed by Deitra Mayo MD on 07/08/2022 at 4:40:56 PM.    Final    CT CHEST WO CONTRAST  Result Date: 07/05/2022 CLINICAL DATA:  Thoracic aorta disease, pre-op planning EXAM: CT CHEST WITHOUT CONTRAST TECHNIQUE: Multidetector CT imaging of the chest was performed following the standard protocol without IV contrast. RADIATION DOSE REDUCTION: This exam was performed according to the departmental dose-optimization program which includes automated exposure control, adjustment of the mA and/or kV according to patient size and/or use of iterative reconstruction technique. COMPARISON:  None Available. FINDINGS: Cardiovascular: Diffuse coronary artery calcifications. Moderate aortic calcifications. No aneurysm. Maximum aortic diameter 3.6 cm in the ascending thoracic aorta. Mediastinum/Nodes: Scattered mediastinal  lymph nodes. No mediastinal, hilar, or axillary adenopathy. Trachea and esophagus are unremarkable. Thyroid unremarkable. Lungs/Pleura: No confluent opacities or effusions. Upper Abdomen: No acute findings Musculoskeletal: Chest wall soft tissues are unremarkable. No acute bony abnormality. IMPRESSION: Diffuse coronary artery disease. No evidence of aortic aneurysm. No acute cardiopulmonary disease. Aortic Atherosclerosis (ICD10-I70.0). Electronically Signed   By: Rolm Baptise M.D.   On: 07/05/2022 21:28   ECHOCARDIOGRAM COMPLETE  Result Date: 07/05/2022    ECHOCARDIOGRAM REPORT   Patient Name:   Craig Lawson Date of Exam: 07/05/2022 Medical Rec #:  [id number]     Height:       69.0 in Accession #:    [id number]    Weight:       258.0 lb Date of Birth:  [birth year]      BSA:          2.302 m Patient Age:    14 years      BP:  128/79 mmHg Patient Gender: M             HR:           58 bpm. Exam Location:  Inpatient Procedure: Color Doppler, Cardiac Doppler and 2D Echo Indications:    CAD  History:        Patient has prior history of Echocardiogram examinations. CAD,                 TIA, Arrythmias:LBBB and Bradycardia, Signs/Symptoms:Murmur;                 Risk Factors:Hypertension.  Sonographer:    Marella Chimes Referring Phys: Burnell Blanks  Sonographer Comments: Patient is obese. IMPRESSIONS  1. The aortic valve is calcified. There is severe calcifcation of the aortic valve. There is moderate thickening of the aortic valve. Aortic valve regurgitation is mild. Moderate aortic valve stenosis. Aortic valve mean gradient measures 28.0 mmHg. Aortic valve Vmax measures 3.63 m/s.  2. Left ventricular ejection fraction, by estimation, is 60 to 65%. The left ventricle has normal function. The left ventricle has no regional wall motion abnormalities. Left ventricular diastolic parameters are consistent with Grade I diastolic dysfunction (impaired relaxation).  3. Right ventricular systolic function is  normal. The right ventricular size is normal.  4. Left atrial size was mildly dilated.  5. The mitral valve is normal in structure. No evidence of mitral valve regurgitation. No evidence of mitral stenosis.  6. The inferior vena cava is normal in size with greater than 50% respiratory variability, suggesting right atrial pressure of 3 mmHg. FINDINGS  Left Ventricle: Left ventricular ejection fraction, by estimation, is 60 to 65%. The left ventricle has normal function. The left ventricle has no regional wall motion abnormalities. The left ventricular internal cavity size was normal in size. There is  no left ventricular hypertrophy. Left ventricular diastolic parameters are consistent with Grade I diastolic dysfunction (impaired relaxation). Right Ventricle: The right ventricular size is normal. No increase in right ventricular wall thickness. Right ventricular systolic function is normal. Left Atrium: Left atrial size was mildly dilated. Right Atrium: Right atrial size was normal in size. Pericardium: There is no evidence of pericardial effusion. Mitral Valve: The mitral valve is normal in structure. Mild mitral annular calcification. No evidence of mitral valve regurgitation. No evidence of mitral valve stenosis. Tricuspid Valve: The tricuspid valve is normal in structure. Tricuspid valve regurgitation is not demonstrated. No evidence of tricuspid stenosis. Aortic Valve: The aortic valve is calcified. There is severe calcifcation of the aortic valve. There is moderate thickening of the aortic valve. Aortic valve regurgitation is mild. Moderate aortic stenosis is present. Aortic valve mean gradient measures 28.0 mmHg. Aortic valve peak gradient measures 52.7 mmHg. Pulmonic Valve: The pulmonic valve was normal in structure. Pulmonic valve regurgitation is trivial. No evidence of pulmonic stenosis. Aorta: The aortic root is normal in size and structure. Venous: The inferior vena cava is normal in size with greater  than 50% respiratory variability, suggesting right atrial pressure of 3 mmHg. IAS/Shunts: No atrial level shunt detected by color flow Doppler.  LEFT VENTRICLE PLAX 2D LVIDd:         6.30 cm Diastology LVIDs:         4.60 cm LV e' medial:    4.46 cm/s LV PW:         1.00 cm LV E/e' medial:  7.1 LV IVS:        1.00 cm LV e' lateral:  7.07 cm/s                        LV E/e' lateral: 4.5  RIGHT VENTRICLE RV S prime:     14.00 cm/s TAPSE (M-mode): 2.1 cm LEFT ATRIUM             Index LA diam:        4.50 cm 1.95 cm/m LA Vol (A2C):   43.5 ml 18.90 ml/m LA Vol (A4C):   31.2 ml 13.55 ml/m LA Biplane Vol: 40.1 ml 17.42 ml/m  AORTIC VALVE AV Vmax:           363.00 cm/s AV Vmean:          247.000 cm/s AV VTI:            0.752 m AV Peak Grad:      52.7 mmHg AV Mean Grad:      28.0 mmHg LVOT Vmax:         113.00 cm/s LVOT Vmean:        73.400 cm/s LVOT VTI:          0.216 m LVOT/AV VTI ratio: 0.29  AORTA Ao Asc diam: 3.90 cm MITRAL VALVE MV Area (PHT): 2.16 cm     SHUNTS MV Decel Time: 352 msec     Systemic VTI: 0.22 m MV E velocity: 31.83 cm/s MV A velocity: 106.00 cm/s MV E/A ratio:  0.30 Candee Furbish MD Electronically signed by Candee Furbish MD Signature Date/Time: 07/05/2022/3:08:16 PM    Final    CARDIAC CATHETERIZATION  Result Date: 07/04/2022   Dist RCA lesion is 50% stenosed.   2nd Diag lesion is 25% stenosed.   1st Diag lesion is 75% stenosed.   RPDA lesion is 30% stenosed.   RPAV lesion is 60% stenosed.   Prox LAD to Mid LAD lesion is 20% stenosed.   Ost Cx to Prox Cx lesion is 90% stenosed.   Mid LM to Ost LAD lesion is 80% stenosed.   Ost LAD lesion is 80% stenosed.   Previously placed 1st Mrg stent of unknown type is  widely patent. Severe distal left main stenosis (IVUS minimum lumen area of 4.0 mm2) Severe ostial LAD stenosis (IVUD minimum lumen area of 3.5 mm2). Patent mid LAD stent. Severe ostial Circumflex stenosis. Patent obtuse marginal stent Large dominant RCA with moderate distal stenosis, patent  PDA stent, moderate posterolateral artery stenosis. Severe aortic stenosis by echo (Cath data: mean gradient 32.8 mmHg, peak to peak gradient 42 mmHg) Recommendations: Severe distal left main stenosis involving both the LAD and Circumflex, confirmed with IVUS imaging. Severe aortic stenosis. He will need a CT surgery consult for CABG and AVR. He has been on Plavix so I will begin holding that tonight. Repeat echo tomorrow. 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 He will also need Plavix washout before surgery. Resume IV heparin 8 hours post sheath pull. Will call CT surgery to see him tomorrow.   DG Chest 2 View  Result Date: 07/03/2022 CLINICAL DATA:  Provided history: Chest pain. EXAM: CHEST - 2 VIEW COMPARISON:  Prior chest radiographs 11/25/2021 and earlier. FINDINGS: Stable cardiac and mediastinal contours as compared to the prior chest radiographs of 11/25/2021. Aortic atherosclerosis. Chronic elevation of the right hemidiaphragm. No appreciable airspace consolidation or pulmonary edema. No evidence of pleural effusion or pneumothorax. No acute osseous abnormality identified. Surgical clips within the  upper abdomen. IMPRESSION: No evidence of acute cardiopulmonary abnormality. Aortic Atherosclerosis (ICD10-I70.0). Electronically Signed   By: Kellie Simmering D.O.   On: 07/03/2022 16:10     Results for orders placed or performed during the hospital encounter of 07/03/22 (from the past 72 hour(s))  Glucose, capillary     Status: Abnormal   Collection Time: 07/19/22 12:14 PM  Result Value Ref Range   Glucose-Capillary 108 (H) 70 - 99 mg/dL    Comment: Glucose reference range applies only to samples taken after fasting for at least 8 hours.  Glucose, capillary     Status: Abnormal   Collection Time: 07/19/22  4:35 PM  Result Value Ref Range   Glucose-Capillary 286 (H) 70 - 99 mg/dL    Comment: Glucose reference range applies only to samples  taken after fasting for at least 8 hours.   Comment 1 Notify RN    Comment 2 Document in Chart   Glucose, capillary     Status: Abnormal   Collection Time: 07/19/22  9:11 PM  Result Value Ref Range   Glucose-Capillary 106 (H) 70 - 99 mg/dL    Comment: Glucose reference range applies only to samples taken after fasting for at least 8 hours.  CBC     Status: Abnormal   Collection Time: 07/20/22  1:14 AM  Result Value Ref Range   WBC 7.9 4.0 - 10.5 K/uL   RBC 3.20 (L) 4.22 - 5.81 MIL/uL   Hemoglobin 9.8 (L) 13.0 - 17.0 g/dL   HCT 29.0 (L) 39.0 - 52.0 %   MCV 90.6 80.0 - 100.0 fL   MCH 30.6 26.0 - 34.0 pg   MCHC 33.8 30.0 - 36.0 g/dL   RDW 13.9 11.5 - 15.5 %   Platelets 251 150 - 400 K/uL   nRBC 0.0 0.0 - 0.2 %    Comment: Performed at [facility], [place] [address], [place], [place] [id number]  Basic metabolic panel     Status: Abnormal   Collection Time: 07/20/22  1:14 AM  Result Value Ref Range   Sodium 133 (L) 135 - 145 mmol/L   Potassium 4.4 3.5 - 5.1 mmol/L   Chloride 100 98 - 111 mmol/L   CO2 27 22 - 32 mmol/L   Glucose, Bld 108 (H) 70 - 99 mg/dL    Comment: Glucose reference range applies only to samples taken after fasting for at least 8 hours.   BUN 30 (H) 8 - 23 mg/dL   Creatinine, Ser 1.15 0.61 - 1.24 mg/dL   Calcium 7.9 (L) 8.9 - 10.3 mg/dL   GFR, Estimated >60 >60 mL/min    Comment: (NOTE) Calculated using the CKD-EPI Creatinine Equation (2021)    Anion gap 6 5 - 15    Comment: Performed at [facility] [address] [place], [place] [address]  Glucose, capillary     Status: Abnormal   Collection Time: 07/20/22  6:02 AM  Result Value Ref Range   Glucose-Capillary 102 (H) 70 - 99 mg/dL    Comment: Glucose reference range applies only to samples taken after fasting for at least 8 hours.   Comment 1 Notify RN    Comment 2 Document in Chart   Glucose, capillary     Status: Abnormal   Collection Time: 07/20/22 12:14 PM  Result Value Ref Range    Glucose-Capillary 110 (H) 70 - 99 mg/dL    Comment: Glucose reference range applies only to samples taken after fasting  for at least 8 hours.   Comment 1 Notify RN    Comment 2 Document in Chart   Glucose, capillary     Status: Abnormal   Collection Time: 07/20/22  5:02 PM  Result Value Ref Range   Glucose-Capillary 112 (H) 70 - 99 mg/dL    Comment: Glucose reference range applies only to samples taken after fasting for at least 8 hours.   Comment 1 Notify RN    Comment 2 Document in Chart   Glucose, capillary     Status: Abnormal   Collection Time: 07/20/22  9:07 PM  Result Value Ref Range   Glucose-Capillary 112 (H) 70 - 99 mg/dL    Comment: Glucose reference range applies only to samples taken after fasting for at least 8 hours.  Glucose, capillary     Status: Abnormal   Collection Time: 07/21/22  6:05 AM  Result Value Ref Range   Glucose-Capillary 104 (H) 70 - 99 mg/dL    Comment: Glucose reference range applies only to samples taken after fasting for at least 8 hours.  Glucose, capillary     Status: Abnormal   Collection Time: 07/21/22 11:05 AM  Result Value Ref Range   Glucose-Capillary 108 (H) 70 - 99 mg/dL    Comment: Glucose reference range applies only to samples taken after fasting for at least 8 hours.  Glucose, capillary     Status: Abnormal   Collection Time: 07/21/22  5:39 PM  Result Value Ref Range   Glucose-Capillary 105 (H) 70 - 99 mg/dL    Comment: Glucose reference range applies only to samples taken after fasting for at least 8 hours.  Glucose, capillary     Status: Abnormal   Collection Time: 07/21/22  8:53 PM  Result Value Ref Range   Glucose-Capillary 126 (H) 70 - 99 mg/dL    Comment: Glucose reference range applies only to samples taken after fasting for at least 8 hours.  Glucose, capillary     Status: None   Collection Time: 07/22/22  5:41 AM  Result Value Ref Range   Glucose-Capillary 98 70 - 99 mg/dL    Comment: Glucose reference range applies only  to samples taken after fasting for at least 8 hours.       Treatments: surgery:  CARDIOVASCULAR SURGERY OPERATIVE NOTE   07/11/2022 Phillip Heal [id number]   Surgeon:  Everlena Cooper, MD   First Assistant: Jadene Pierini [facility]                                  Preoperative Diagnosis:  Severe Aortic Stenosis                                               Left main Coronary artery Disease   Postoperative Diagnosis:  Same     Procedure: Aortic Valve Replacement with a 73mm Inspiris Pericardial Valve [id number])                     Coronary artery bypass x 2 with the LIMA to the LAD and RSVG from aorta to OM   Anesthesia:  General Endotracheal   Discharge Exam: Blood pressure 136/79, pulse 82, temperature 98.4 F (36.9 C), temperature source Oral, resp. rate 20, height 5\' 9"  (1.753 m), weight 115.9  kg, SpO2 94 %.    General appearance: alert, cooperative, and no distress Heart: regular rate and rhythm and soft systolic murmur Lungs: clear Abdomen: benign Extremities: minor edema Wound: incis healing well  Discharge Medications:  The patient has been discharged on:   1.Beta Blocker:  Yes Blue.Reese   ]                              No   [   ]                              If No, reason:  2.Ace Inhibitor/ARB: Yes [   ]                                     No  [  n  ]                                     If No, reason:BP well controlled and labile early postoperatively, may be able to reume as outpatient  3.Statin:   Yes Kaito.Grams   ]                  No  [   ]                  If No, reason:  4.Ecasa:  Yes  [ y  ]                  No   [   ]                  If No, reason:  Patient had ACS upon admission:y  Plavix/P2Y12 inhibitor: Yes [ y  ]                                      No  [   ]     Discharge Instructions     Amb Referral to Cardiac Rehabilitation   Complete by: As directed    Diagnosis: CABG   CABG X ___: 2   After initial evaluation and assessments completed:  Virtual Based Care may be provided alone or in conjunction with Phase 2 Cardiac Rehab based on patient barriers.: Yes   Intensive Cardiac Rehabilitation (ICR) [place] location only OR Traditional Cardiac Rehabilitation (TCR) *If criteria for ICR are not met will enroll in TCR [facility] only): Yes   Discharge patient   Complete by: As directed    When arrangements in place for home night time oxygen   Discharge disposition: 01-Home or Self Care   Discharge patient date: 07/22/2022      Allergies as of 07/22/2022       Reactions   Penicillins    Had rash in his 30s-40s with this as well as recurrence when re-trialed   Statins Other (See Comments)   Muscle pains; increasing with continued use.  Muscle pains; increasing with continued use.    Isosorbide Other (See Comments)   "makes me dizzy and I have headaches"   Latex Rash   Has noticed with bandages in past, leaves a sore on  skin but more recently with APAP mask         Medication List     STOP taking these medications    hydrochlorothiazide 25 MG tablet Commonly known as: HYDRODIURIL   nitroGLYCERIN 0.4 MG SL tablet Commonly known as: Nitrostat   olmesartan 20 MG tablet Commonly known as: BENICAR   pravastatin 20 MG tablet Commonly known as: PRAVACHOL   sildenafil 50 MG tablet Commonly known as: VIAGRA       TAKE these medications    amiodarone 200 MG tablet Commonly known as: PACERONE Take 1 tablet (200 mg total) by mouth daily. Start taking on: July 23, 2022   aspirin EC 81 MG tablet Take 81 mg by mouth at bedtime.   Cholecalciferol 125 MCG (5000 UT) Tabs Take 1 tablet by mouth daily.   clopidogrel 75 MG tablet Commonly known as: PLAVIX TAKE 1 TABLET BY MOUTH AT  BEDTIME   ezetimibe 10 MG tablet Commonly known as: ZETIA Take 10 mg by mouth at bedtime.   ferrous sulfate 325 (65 FE) MG tablet Take 1 tablet (325 mg total) by mouth daily with breakfast. Start taking on: July 23, 2022   furosemide 40 MG  tablet Commonly known as: LASIX Take 1 tablet (40 mg total) by mouth daily. Start taking on: July 23, 2022 What changed:  medication strength how much to take when to take this   gabapentin 100 MG capsule Commonly known as: NEURONTIN Take 1 capsule (100 mg total) by mouth 2 (two) times daily.   metoprolol tartrate 25 MG tablet Commonly known as: LOPRESSOR Take 1 tablet (25 mg total) by mouth 2 (two) times daily.   oxyCODONE 5 MG immediate release tablet Commonly known as: Oxy IR/ROXICODONE Take 1 tablet (5 mg total) by mouth every 6 (six) hours as needed for up to 7 days for severe pain.   potassium chloride SA 20 MEQ tablet Commonly known as: KLOR-CON M Take 1 tablet (20 mEq total) by mouth daily. Start taking on: July 23, 2022   rosuvastatin 20 MG tablet Commonly known as: CRESTOR Take 1 tablet (20 mg total) by mouth daily. Start taking on: July 23, 2022               Durable Medical Equipment  (From admission, onward)           Start     Ordered   07/21/22 [id number]  For home use only DME Walker rolling  Once       Question Answer Comment  Walker: With [place] Wheels   Patient needs a walker to treat with the following condition Imbalance      07/20/22 0939   07/21/22 0819  For home use only DME oxygen  Once       Question Answer Comment  Length of Need 6 Months   Mode or (Route) Nasal cannula   Liters per Minute 2   Frequency Only at night (stationary unit needed)   Oxygen delivery system Gas      07/21/22 0818            Follow-up Information     Coralie Common, MD Follow up.   Specialty: Cardiothoracic Surgery Why: Please see discharge paperwork for details of follow-up appointment with surgery office Contact information: [address] [place] [address] [phone]         [facility] Follow up.   Why: On the date you were seen in the surgeons office please  obtain a chest x-ray at [facility] 1 hour prior  to surgery office appointment. Contact information: [place]        Richardo Priest, MD Follow up.   Specialties: Cardiology, Radiology Why: Please see discharge paperwork for details of follow-up appointment with cardiology. Contact information: [place] [address] [phone]         [facility] Follow up.   Specialty: Pulmonology Why: Please recontact the office to arrange sleep medicine consult.  This will need to be done to obtain sleep studt/CPAP Contact information: [facility] [address] [place] [id number] [facility] Follow up.   Why: home 02 arranged-- they will contact you to deliver 02 concentrator to home Contact information: [place] [address] [phone]                 Signed:  John Giovanni, PA-C  07/22/2022, 10:50 AM

## 2022-07-14 NOTE — Anesthesia Postprocedure Evaluation (Signed)
Anesthesia Post Note  Patient: Craig Lawson  Procedure(s) Performed: CORONARY ARTERY BYPASS GRAFTING (CABG) X TWO, USING LEFT INTERNAL MAMMARY AND RIGHT LEG GREATER SAPHENOUS VEIN HARVESTED ENDOSCOPICALLY (Chest) AORTIC VALVE REPLACEMENT (AVR) USING INSPIRIS VALVE SIZE 25MM (Chest) TRANSESOPHAGEAL ECHOCARDIOGRAM     Patient location during evaluation: ICU Anesthesia Type: General Level of consciousness: sedated Pain management: pain level controlled Vital Signs Assessment: post-procedure vital signs reviewed and stable Respiratory status: patient remains intubated per anesthesia plan Cardiovascular status: stable Postop Assessment: no apparent nausea or vomiting Anesthetic complications: no   No notable events documented.          Audie Stayer

## 2022-07-14 NOTE — Progress Notes (Addendum)
[facility] DAILY ICU PROGRESS NOTE                   [place]            , [address]          [phone]   3 Days Post-Op Procedure(s) (LRB): CORONARY ARTERY BYPASS GRAFTING (CABG) X TWO, USING LEFT INTERNAL MAMMARY AND RIGHT LEG GREATER SAPHENOUS VEIN HARVESTED ENDOSCOPICALLY (N/A) AORTIC VALVE REPLACEMENT (AVR) USING INSPIRIS VALVE SIZE 25MM (N/A) TRANSESOPHAGEAL ECHOCARDIOGRAM (N/A)  Total Length of Stay:  LOS: 11 days   Subjective: Feels ok  Objective: Vital signs in last 24 hours: Temp:  [97.2 F (36.2 C)-99.9 F (37.7 C)] 98 F (36.7 C) (03/25 0655) Pulse Rate:  [56-70] 68 (03/25 0720) Cardiac Rhythm: Normal sinus rhythm;Bundle branch block (03/24 2000) Resp:  [11-26] 16 (03/25 0720) BP: (74-112)/(53-70) 87/58 (03/25 0700) SpO2:  [88 %-95 %] 94 % (03/25 0720) Weight:  [121.9 kg] 121.9 kg (03/25 0500)  Filed Weights   07/12/22 0500 07/13/22 0500 07/14/22 0500  Weight: 118.3 kg 120.8 kg 121.9 kg    Weight change: 1.089 kg   Hemodynamic parameters for last 24 hours: CVP:  [10 mmHg-11 mmHg] 10 mmHg  Intake/Output from previous day: 03/24 0701 - 03/25 0700 In: 1216.6 [P.O.:720; I.V.:496.6] Out: 830 [Urine:830]  Intake/Output this shift: No intake/output data recorded.  Current Meds: Scheduled Meds:  acetaminophen  1,000 mg Oral Q6H   Or   acetaminophen (TYLENOL) oral liquid 160 mg/5 mL  1,000 mg Per Tube Q6H   aspirin EC  325 mg Oral Daily   Or   aspirin  324 mg Per Tube Daily   bisacodyl  10 mg Oral Daily   Or   bisacodyl  10 mg Rectal Daily   bumetanide (BUMEX) IV  1 mg Intravenous Once   Chlorhexidine Gluconate Cloth  6 each Topical Q0600   docusate sodium  200 mg Oral Daily   enoxaparin (LOVENOX) injection  40 mg Subcutaneous QHS   ezetimibe  10 mg Oral QHS   ferrous sulfate  325 mg Oral Q breakfast   gabapentin  100 mg Oral BID   insulin aspart  0-24 Units Subcutaneous TID WC   midodrine  5 mg Oral TID WC   pantoprazole   40 mg Oral Daily   potassium chloride  20 mEq Oral Daily   rosuvastatin  20 mg Oral Daily   sodium chloride flush  3 mL Intravenous Q12H   Continuous Infusions:  sodium chloride Stopped (07/12/22 2110)   lactated ringers     lactated ringers 20 mL/hr at 07/14/22 0700   PRN Meds:.metoprolol tartrate, morphine injection, ondansetron (ZOFRAN) IV, mouth rinse, oxyCODONE, sodium chloride flush, traMADol  General appearance: alert, cooperative, fatigued, and no distress Heart: regular rate and rhythm Lungs: mildly dim right base Abdomen: benign Extremities: minimal edema Wound: incis healing well  Lab Results: CBC: Recent Labs    07/13/22 0415 07/14/22 0407  WBC 17.1* 12.6*  HGB 11.4* 10.4*  HCT 33.4* 29.5*  PLT 119* 114*   BMET:  Recent Labs    07/13/22 0415 07/14/22 0407  NA 129* 128*  K 4.2 3.9  CL 97* 95*  CO2 26 27  GLUCOSE 124* 104*  BUN 41* 52*  CREATININE 2.19* 1.90*  CALCIUM 7.8* 7.9*    CMET: Lab Results  Component Value Date   WBC 12.6 (H) 07/14/2022   HGB 10.4 (L) 07/14/2022   HCT 29.5 (L) 07/14/2022  PLT 114 (L) 07/14/2022   GLUCOSE 104 (H) 07/14/2022   CHOL 184 07/04/2022   TRIG 196 (H) 07/04/2022   HDL 37 (L) 07/04/2022   LDLCALC 108 (H) 07/04/2022   ALT 37 07/04/2022   AST 26 07/04/2022   NA 128 (L) 07/14/2022   K 3.9 07/14/2022   CL 95 (L) 07/14/2022   CREATININE 1.90 (H) 07/14/2022   BUN 52 (H) 07/14/2022   CO2 27 07/14/2022   TSH 4.966 (H) 07/04/2022   INR 1.3 (H) 07/11/2022   HGBA1C 5.3 07/10/2022      PT/INR:  Recent Labs    07/11/22 1402  LABPROT 16.1*  INR 1.3*   Radiology: No results found.   Assessment/Plan: S/P Procedure(s) (LRB): CORONARY ARTERY BYPASS GRAFTING (CABG) X TWO, USING LEFT INTERNAL MAMMARY AND RIGHT LEG GREATER SAPHENOUS VEIN HARVESTED ENDOSCOPICALLY (N/A) AORTIC VALVE REPLACEMENT (AVR) USING INSPIRIS VALVE SIZE 25MM (N/A) TRANSESOPHAGEAL ECHOCARDIOGRAM (N/A)  POD#3  1 Tmax 99.9, BP  74-112/53-70, sinus/BBB 2 sats ok on 4 liters 3 weight up 8 kg if accurate, fair UOP- clinically does not appear to be that overloaded 4 BS well controlled 5 AKI, creat improved today to 1.90 from 2.19, BUN increased too 52 from 41 yesterday- diuretics now being held, CCM is assisting with management 6 hyponatremia- relatively stable with sodium 128 7 reactive leukocytosis, trending lower, WBC 12.6 from peak of 21.8 8 expected ABLA- trending lower but not at transfusion threshold- on iron 9 no CXR today 10 routine pulm hygiene and cardiac rehab   Craig Giovanni PA-C Pager [phone] 07/14/2022 7:45 AM  Agree with above

## 2022-07-14 NOTE — Progress Notes (Signed)
NAME:  Craig Lawson, MRN:  [id number], DOB:  11/06/1945, LOS: 30 ADMISSION DATE:  07/03/2022, CONSULTATION DATE:  07/11/2022 REFERRING MD: Yolanda Manges, CHIEF COMPLAINT: Postcardiac surgery care.  History of Present Illness:  77 year old man who underwent uneventful bioprosthetic AVR and two-vessel bypass.  He was admitted 8 days ago for crescendo angina resulting in NSTEMI.  Found also to have moderate to severe aortic stenosis as well as two-vessel coronary disease.  Deemed to be at high risk for subsequent MACE, so kept in hospital for urgent revascularization.  Required Plavix washout. Jehovah's Witness.  Refuses blood and albumin.  Did receive preoperative erythropoietin.  Pertinent  Medical History   Past Medical History:  Diagnosis Date   Aortic stenosis 08/10/2015   Bilateral leg edema 08/22/2015   Bradycardia 06/01/2017   Cellulitis of left lower extremity 04/29/2017   Coronary artery disease involving native coronary artery 10/22/2017   DOE (dyspnea on exertion) 05/04/2017   Essential hypertension 08/13/2015   Heart murmur 04/29/2017   Kissing, osteophytes 08/22/2015   LBBB (left bundle branch block) 08/22/2015   Lipid disorder 11/30/2017   Lumbar facet joint pain 08/22/2015   Medicare annual wellness visit, subsequent 06/02/2016   Morbid obesity ([place]) 10/22/2017   Murmur 04/29/2017   Myopathy 09/02/2019   Formatting of this note might be different from the original. Secondary to high freq statin use.   Need for vaccination for Strep pneumoniae 03/27/2017   Non-ST elevation myocardial infarction (NSTEMI), subendocardial infarction, subsequent episode of care ([place]) 11/30/2017   Obesity, Class II, BMI 35-39.9 08/10/2015   OSA on CPAP 12/02/2017   Pure hypercholesterolemia 05/24/2015   Seborrheic keratoses 03/27/2017   Overview:  Of the right forehead.   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 Pain is adequately controlled.   Objective   Blood pressure (!) 100/59, pulse 66, temperature 98 F (36.7 C), temperature source Oral, resp. rate 11, height 5\' 9"  (1.753 m), weight 121.9 kg, SpO2 94 %. CVP:  [10 mmHg-11 mmHg] 10 mmHg      Intake/Output Summary (Last 24 hours) at 07/14/2022 0706 Last data filed at 07/14/2022 0600 Gross per 24 hour  Intake 1196.52 ml  Output 830 ml  Net 366.52 ml    Filed Weights   07/12/22 0500 07/13/22 0500 07/14/22 0500  Weight: 118.3 kg 120.8 kg 121.9 kg    Examination: General: elderly man sitting up in the chair in NAD HENT: [place]/AT, eyes anicteric Lungs: breathing comfortably on [place], CTAB Cardiovascular: midline incision healing well, S1S2, RRR  Abdomen: soft, NT Extremities: +edema, no cyanosis Neuro: awake, alert, answering questions appropriately Derm: warm, dry, no diffuse rashes  Na+  128 BUN 52 Cr 1.9 BNP 235 WBC 12.6 H/H 10.4/29.5 Platelets 114   Ancillary tests personally reviewed:    Assessment & Plan:  Jehova's witness -does not want albumin or blood products  Critically ill following AVR CABG x 2 vessels Coronary artery disease status post revascularization Severe aortic stenosis status post valve replacement -gentle diuresis today -midodrine 5 mg TID -aspirin, statin, zetia -metoprolol on hold  until BP improves -post-op pain control per protocol; multimodal pain control with gabapentin -progressive mobility  AKI, hypervolemic hyponatremia -Maintain adequate perfusion -strict I/O -renally dose meds, avoid nephrotoxic  meds -start gentle diuresis today  Dyslipidemia -statin, zetia  History of hypertension -holding metoprolol until BP improves -wean off midodrine  Anemia, iron deficiency Consumptive thrombocytopenia -iron supplements -no transfusions - Jehova's witness  OSA intolerant of CPAP -nocturnal CPAP   Best Practice (right click and "Reselect all SmartList Selections" daily)   Diet/type: Regular diet DVT prophylaxis: Enoxaparin.  Monitor if creatinine continues to worsen may need to switch to heparin. GI prophylaxis: N/A Lines: Central line Foley:  Yes, and it is still needed Code Status:  full code Last date of multidisciplinary goals of care discussion [per cardiac surgery]  Julian Hy, DO 07/14/22 11:47 AM [facility]  For contact information, see Amion. If no response to pager, please call PCCM consult pager. After hours, 7PM- 7AM, please call [place].

## 2022-07-14 NOTE — Progress Notes (Signed)
  Echocardiogram 2D Echocardiogram has been performed.  Divine Imber Renold Don 07/14/2022, 5:26 PM

## 2022-07-14 NOTE — Progress Notes (Addendum)
Pharmacy Antibiotic Note  Craig Lawson is a 77 y.o. male admitted on 07/03/2022 with pneumonia.  Pharmacy has been consulted for vancomycin and aztreonam dosing.  WBC trending down to 12.6. PCT 0.58. Tmax/24 hr was 99.9. CXR showing scattered linear atelectasis. Now on BiPAP. Has PCN allergy (rash in 30-40s but recurred with re-trial) - no records of cephalosporin use.   Plan: Vancomycin 1g IV every 24 hours (estAUC 529, Vd 0.5) Aztreonam 2g IV every 12 hours  Monitor renal fx, cx results, clinical pic, and vanc levels as appropriate  Height: 5\' 9"  (175.3 cm) Weight: 121.9 kg (268 lb 11.2 oz) IBW/kg (Calculated) : 70.7  Temp (24hrs), Avg:98.7 F (37.1 C), Min:98 F (36.7 C), Max:99.9 F (37.7 C)  Recent Labs  Lab 07/11/22 1843 07/12/22 0311 07/12/22 1555 07/13/22 0415 07/14/22 0407  WBC 21.8* 18.6* 16.6* 17.1* 12.6*  CREATININE 1.10 1.43* 1.93* 2.19* 1.90*    Estimated Creatinine Clearance: 41.3 mL/min (A) (by C-G formula based on SCr of 1.9 mg/dL (H)).    Allergies  Allergen Reactions   Penicillins     Had rash in his 30s-40s with this as well as recurrence when re-trialed   Statins Other (See Comments)    Muscle pains; increasing with continued use.   Muscle pains; increasing with continued use.    Isosorbide Other (See Comments)    "makes me dizzy and I have headaches"   Latex Rash    Has noticed with bandages in past, leaves a sore on skin but more recently with APAP mask     Antimicrobials this admission: Levofloxacin 3/22 >> 3/23 (surg ppx) Vanc 3/22, 3/25 >>  Aztreonam 3/25 >>  Dose adjustments this admission: N/A  Microbiology results: 3/25 MRSA PCR: sent  3/25 Sputum: sent  3/20 MRSA PCR: neg  Thank you for allowing pharmacy to be a part of this patient's care.  Antonietta Jewel, PharmD, [place] Clinical Pharmacist  Phone: [phone] 07/14/2022 4:26 PM  Please check AMION for all [place] phone numbers After 10:00 PM, call [place]  [phone]

## 2022-07-15 ENCOUNTER — Inpatient Hospital Stay (HOSPITAL_COMMUNITY): Payer: Medicare Other

## 2022-07-15 DIAGNOSIS — R7989 Other specified abnormal findings of blood chemistry: Secondary | ICD-10-CM

## 2022-07-15 DIAGNOSIS — J9601 Acute respiratory failure with hypoxia: Secondary | ICD-10-CM | POA: Diagnosis not present

## 2022-07-15 DIAGNOSIS — J189 Pneumonia, unspecified organism: Secondary | ICD-10-CM | POA: Diagnosis not present

## 2022-07-15 DIAGNOSIS — I214 Non-ST elevation (NSTEMI) myocardial infarction: Secondary | ICD-10-CM | POA: Diagnosis not present

## 2022-07-15 HISTORY — DX: Acute respiratory failure with hypoxia: J96.01

## 2022-07-15 HISTORY — DX: Pneumonia, unspecified organism: J18.9

## 2022-07-15 HISTORY — DX: Other specified abnormal findings of blood chemistry: R79.89

## 2022-07-15 LAB — PROCALCITONIN: Procalcitonin: 0.42 ng/mL

## 2022-07-15 LAB — BASIC METABOLIC PANEL
Anion gap: 8 (ref 5–15)
Anion gap: 9 (ref 5–15)
BUN: 46 mg/dL — ABNORMAL HIGH (ref 8–23)
BUN: 39 mg/dL — ABNORMAL HIGH (ref 8–23)
CO2: 28 mmol/L (ref 22–32)
CO2: 27 mmol/L (ref 22–32)
Calcium: 8 mg/dL — ABNORMAL LOW (ref 8.9–10.3)
Calcium: 7.8 mg/dL — ABNORMAL LOW (ref 8.9–10.3)
Chloride: 97 mmol/L — ABNORMAL LOW (ref 98–111)
Chloride: 99 mmol/L (ref 98–111)
Creatinine, Ser: 1.43 mg/dL — ABNORMAL HIGH (ref 0.61–1.24)
Creatinine, Ser: 1.27 mg/dL — ABNORMAL HIGH (ref 0.61–1.24)
GFR, Estimated: 50 mL/min — ABNORMAL LOW (ref >60)
GFR, Estimated: 58 mL/min — ABNORMAL LOW (ref >60)
Glucose, Bld: 104 mg/dL — ABNORMAL HIGH (ref 70–99)
Glucose, Bld: 93 mg/dL (ref 70–99)
Potassium: 4.1 mmol/L (ref 3.5–5.1)
Potassium: 4.1 mmol/L (ref 3.5–5.1)
Sodium: 133 mmol/L — ABNORMAL LOW (ref 135–145)
Sodium: 135 mmol/L (ref 135–145)

## 2022-07-15 LAB — TROPONIN I (HIGH SENSITIVITY): Troponin I (High Sensitivity): 1451 ng/L (ref <18)

## 2022-07-15 LAB — GLUCOSE, CAPILLARY
Glucose-Capillary: 132 mg/dL — ABNORMAL HIGH (ref 70–99)
Glucose-Capillary: 162 mg/dL — ABNORMAL HIGH (ref 70–99)
Glucose-Capillary: 78 mg/dL (ref 70–99)
Glucose-Capillary: 103 mg/dL — ABNORMAL HIGH (ref 70–99)
Glucose-Capillary: 117 mg/dL — ABNORMAL HIGH (ref 70–99)

## 2022-07-15 MED ORDER — SODIUM CHLORIDE 0.9 % IV SOLN
2.0000 g | Freq: Two times a day (BID) | INTRAVENOUS | Status: DC
  Administered 2022-07-15 – 2022-07-17 (×6): 2 g via INTRAVENOUS
  Filled 2022-07-15 (×6): qty 12.5

## 2022-07-15 MED ORDER — AMIODARONE HCL IN DEXTROSE 360-4.14 MG/200ML-% IV SOLN
30.0000 mg/h | INTRAVENOUS | Status: DC
  Administered 2022-07-16: 30 mg/h via INTRAVENOUS
  Filled 2022-07-15: qty 200

## 2022-07-15 MED ORDER — AMIODARONE HCL IN DEXTROSE 360-4.14 MG/200ML-% IV SOLN
60.0000 mg/h | INTRAVENOUS | Status: DC
  Administered 2022-07-15 (×3): 60 mg/h via INTRAVENOUS
  Filled 2022-07-15: qty 200

## 2022-07-15 MED ORDER — BUMETANIDE 0.25 MG/ML IJ SOLN
1.0000 mg | Freq: Two times a day (BID) | INTRAMUSCULAR | Status: AC
  Administered 2022-07-15: 1 mg via INTRAVENOUS
  Filled 2022-07-15: qty 4

## 2022-07-15 MED ORDER — POTASSIUM CHLORIDE 10 MEQ/50ML IV SOLN
10.0000 meq | INTRAVENOUS | Status: AC
  Administered 2022-07-15 (×2): 10 meq via INTRAVENOUS
  Filled 2022-07-15 (×2): qty 50

## 2022-07-15 MED ORDER — BUMETANIDE 0.25 MG/ML IJ SOLN
1.0000 mg | Freq: Once | INTRAMUSCULAR | Status: DC
  Filled 2022-07-15: qty 4

## 2022-07-15 MED ORDER — POTASSIUM CHLORIDE 10 MEQ/50ML IV SOLN
INTRAVENOUS | Status: AC
  Administered 2022-07-15: 10 meq
  Filled 2022-07-15: qty 50

## 2022-07-15 MED ORDER — AMIODARONE LOAD VIA INFUSION
150.0000 mg | Freq: Once | INTRAVENOUS | Status: AC
  Administered 2022-07-15: 150 mg via INTRAVENOUS
  Filled 2022-07-15: qty 83.34

## 2022-07-15 MED ORDER — AMIODARONE HCL IN DEXTROSE 360-4.14 MG/200ML-% IV SOLN
INTRAVENOUS | Status: AC
  Filled 2022-07-15: qty 400

## 2022-07-15 NOTE — Progress Notes (Addendum)
Progress Note  Patient Name: Craig Lawson Date of Encounter: 07/15/2022  Primary Cardiologist:   Shirlee More, MD   Subjective   Had increased SOB and diaphoresis yesterday.  Stat echo ordered.  Today, less SOB.    Inpatient Medications    Scheduled Meds:  acetaminophen  1,000 mg Oral Q6H   Or   acetaminophen (TYLENOL) oral liquid 160 mg/5 mL  1,000 mg Per Tube Q6H   aspirin EC  325 mg Oral Daily   Or   aspirin  324 mg Per Tube Daily   bisacodyl  10 mg Oral Daily   Or   bisacodyl  10 mg Rectal Daily   bumetanide (BUMEX) IV  1 mg Intravenous Once   Chlorhexidine Gluconate Cloth  6 each Topical Q0600   docusate sodium  200 mg Oral Daily   enoxaparin (LOVENOX) injection  40 mg Subcutaneous QHS   ezetimibe  10 mg Oral QHS   ferrous sulfate  325 mg Oral Q breakfast   gabapentin  100 mg Oral BID   insulin aspart  0-24 Units Subcutaneous TID WC   midodrine  5 mg Oral TID WC   pantoprazole  40 mg Oral Daily   potassium chloride  20 mEq Oral Daily   rosuvastatin  20 mg Oral Daily   sodium chloride flush  3 mL Intravenous Q12H   Continuous Infusions:  sodium chloride Stopped (07/12/22 2110)   aztreonam Stopped (07/15/22 0121)   lactated ringers     lactated ringers 20 mL/hr at 07/15/22 0700   vancomycin Stopped (07/14/22 1954)   PRN Meds: metoprolol tartrate, morphine injection, ondansetron (ZOFRAN) IV, mouth rinse, oxyCODONE, sodium chloride flush, traMADol   Vital Signs    Vitals:   07/15/22 0400 07/15/22 0500 07/15/22 0600 07/15/22 0700  BP: 98/60 108/72 117/66 (!) 96/32  Pulse: 63 67 69 78  Resp: 18 16 15 19   Temp:      TempSrc:      SpO2: 98% 100% 99% 97%  Weight:  120.4 kg    Height:        Intake/Output Summary (Last 24 hours) at 07/15/2022 0752 Last data filed at 07/15/2022 0700 Gross per 24 hour  Intake 967.63 ml  Output 3530 ml  Net -2562.37 ml   Filed Weights   07/13/22 0500 07/14/22 0500 07/15/22 0500  Weight: 120.8 kg 121.9 kg 120.4 kg     Telemetry     NSR, PVCs- Personally Reviewed  ECG    NA - Personally Reviewed  Physical Exam   GEN: No  acute distress.   Neck: No  JVD Cardiac: RRR, no murmurs, rubs, or gallops.  Respiratory: Clear   to auscultation bilaterally. GI: Soft, nontender, non-distended, normal bowel sounds  MS:  Diffuse edema, moderate leg edema; No deformity.  Labs    Chemistry Recent Labs  Lab 07/13/22 0415 07/14/22 0407 07/15/22 0424  NA 129* 128* 133*  K 4.2 3.9 4.1  CL 97* 95* 97*  CO2 26 27 28   GLUCOSE 124* 104* 104*  BUN 41* 52* 46*  CREATININE 2.19* 1.90* 1.43*  CALCIUM 7.8* 7.9* 8.0*  GFRNONAA 30* 36* 50*  ANIONGAP 6 6 8      Hematology Recent Labs  Lab 07/12/22 1555 07/13/22 0415 07/14/22 0407  WBC 16.6* 17.1* 12.6*  RBC 4.15* 3.62* 3.29*  HGB 12.9* 11.4* 10.4*  HCT 38.0* 33.4* 29.5*  MCV 91.6 92.3 89.7  MCH 31.1 31.5 31.6  MCHC 33.9 34.1 35.3  RDW 14.5  14.3 14.4  PLT 124* 119* 114*    Cardiac EnzymesNo results for input(s): "TROPONINI" in the last 168 hours. No results for input(s): "TROPIPOC" in the last 168 hours.   BNP Recent Labs  Lab 07/13/22 1745  BNP 235.1*     DDimer No results for input(s): "DDIMER" in the last 168 hours.   Radiology    ECHOCARDIOGRAM LIMITED  Result Date: 07/14/2022    ECHOCARDIOGRAM LIMITED REPORT   Patient Name:   Craig Lawson Date of Exam: 07/14/2022 Medical Rec #:  [id number]     Height:       69.0 in Accession #:    [id number]    Weight:       268.7 lb Date of Birth:  04-14-1946      BSA:          2.342 m Patient Age:    51 years      BP:           102/57 mmHg Patient Gender: M             HR:           65 bpm. Exam Location:  Inpatient Procedure: 2D Echo, Intracardiac Opacification Agent, Cardiac Doppler and Color            Doppler Indications:    Dyspnea  History:        Patient has prior history of Echocardiogram examinations, most                 recent 07/05/2022. Prior CABG and Prior Cardiac Surgery.  Sonographer:     Marella Chimes Referring Phys: [id number] [place] 1. Technically difficult study with very limited visualization of cardiac structures. 2. On contrasted views, LV function appears normal with an estimated EF of 55%. There is paradoxical motion of the interventricular septum due to underlying LBBB. 3. There is a well seated bioprosthetic valve in the aortic position with no signs of paravalvular regurgitation. 4. No pericardial effusion visualized. FINDINGS  Left Ventricle: Left ventricular ejection fraction, by estimation, is 50 to 55%. The left ventricle has low normal function. Abnormal (paradoxical) septal motion consistent with post-operative status and abnormal (paradoxical) septal motion, consistent with left bundle branch block. Left ventricular diastolic parameters are consistent with Grade II diastolic dysfunction (pseudonormalization). Left ventricular diastolic function could not be evaluated due to indeterminate diastolic function. Right Ventricle: The right ventricular size is normal. No increase in right ventricular wall thickness. Right ventricular systolic function is low normal. Left Atrium: Left atrial size was not well visualized. Right Atrium: Right atrial size was not well visualized. Pericardium: There is no evidence of pericardial effusion. Mitral Valve: The mitral valve was not well visualized. Tricuspid Valve: The tricuspid valve is not well visualized. Aortic Valve: The aortic valve has been repaired/replaced. Aortic valve mean gradient measures 11.0 mmHg. Aortic valve peak gradient measures 23.0 mmHg. Aortic valve area, by VTI measures 2.62 cm. There is a bioprosthetic valve present in the aortic position. Pulmonic Valve: The pulmonic valve was not assessed. Venous: The inferior vena cava was not well visualized. LEFT VENTRICLE PLAX 2D LVIDd:         5.30 cm   Diastology LVIDs:         4.30 cm   LV e' medial:    3.26 cm/s LV PW:         1.10 cm   LV E/e' medial:  27.9 LV IVS:  1.10 cm   LV e' lateral:   5.00 cm/s LVOT diam:     2.40 cm   LV E/e' lateral: 18.2 LV SV:         112 LV SV Index:   48 LVOT Area:     4.52 cm  RIGHT VENTRICLE RV S prime:     7.62 cm/s TAPSE (M-mode): 1.0 cm LEFT ATRIUM           Index        RIGHT ATRIUM           Index LA Vol (A4C): 43.0 ml 18.36 ml/m  RA Area:     14.60 cm                                    RA Volume:   31.50 ml  13.45 ml/m  AORTIC VALVE AV Area (Vmax):    2.70 cm AV Area (Vmean):   2.91 cm AV Area (VTI):     2.62 cm AV Vmax:           240.00 cm/s AV Vmean:          150.000 cm/s AV VTI:            0.427 m AV Peak Grad:      23.0 mmHg AV Mean Grad:      11.0 mmHg LVOT Vmax:         143.00 cm/s LVOT Vmean:        96.400 cm/s LVOT VTI:          0.247 m LVOT/AV VTI ratio: 0.58  AORTA Ao Root diam: 3.50 cm Ao Asc diam:  3.40 cm MITRAL VALVE MV Area (PHT): 4.60 cm    SHUNTS MV Decel Time: 165 msec    Systemic VTI:  0.25 m MV E velocity: 90.80 cm/s  Systemic Diam: 2.40 cm MV A velocity: 83.40 cm/s MV E/A ratio:  1.09 Aditya Sabharwal Electronically signed by Hebert Soho Signature Date/Time: 07/14/2022/6:00:23 PM    Final    DG CHEST PORT 1 VIEW  Result Date: 07/14/2022 CLINICAL DATA:  Hypoxia.  Worsening chest pain on left side. EXAM: PORTABLE CHEST 1 VIEW COMPARISON:  07/13/2022. FINDINGS: 1424 hours. Right IJ approach vascular sheath remains in place. Stable postoperative changes of median sternotomy and aortic valve replacement. Low lung volumes accentuate the pulmonary vasculature and cardiomediastinal silhouette. Unchanged linear atelectasis throughout the left-greater-than-right lungs. Probable small left pleural effusion. No pneumothorax. IMPRESSION: Low lung volumes with scattered linear atelectasis throughout the left-greater-than-right lungs and probable small left pleural effusion. Electronically Signed   By: Emmit Alexanders M.D.   On: 07/14/2022 14:33    Cardiac Studies    Echo 07/05/22:  1. The aortic valve is  calcified. There is severe calcifcation of the  aortic valve. There is moderate thickening of the aortic valve. Aortic  valve regurgitation is mild. Moderate aortic valve stenosis. Aortic valve  mean gradient measures 28.0 mmHg.  Aortic valve Vmax measures 3.63 m/s.   2. Left ventricular ejection fraction, by estimation, is 60 to 65%. The  left ventricle has normal function. The left ventricle has no regional  wall motion abnormalities. Left ventricular diastolic parameters are  consistent with Grade I diastolic  dysfunction (impaired relaxation).   3. Right ventricular systolic function is normal. The right ventricular  size is normal.   4. Left atrial size was mildly dilated.  5. The mitral valve is normal in structure. No evidence of mitral valve  regurgitation. No evidence of mitral stenosis.   6. The inferior vena cava is normal in size with greater than 50%  respiratory variability, suggesting right atrial pressure of 3 mmHg.      LHC 07/04/22:   Dist RCA lesion is 50% stenosed.   2nd Diag lesion is 25% stenosed.   1st Diag lesion is 75% stenosed.   RPDA lesion is 30% stenosed.   RPAV lesion is 60% stenosed.   Prox LAD to Mid LAD lesion is 20% stenosed.   Ost Cx to Prox Cx lesion is 90% stenosed.   Mid LM to Ost LAD lesion is 80% stenosed.   Ost LAD lesion is 80% stenosed.   Previously placed 1st Mrg stent of unknown type is  widely patent.   Severe distal left main stenosis (IVUS minimum lumen area of 4.0 mm2) Severe ostial LAD stenosis (IVUD minimum lumen area of 3.5 mm2). Patent mid LAD stent.  Severe ostial Circumflex stenosis. Patent obtuse marginal stent Large dominant RCA with moderate distal stenosis, patent PDA stent, moderate posterolateral artery stenosis.  Severe aortic stenosis by echo (Cath data: mean gradient 32.8 mmHg, peak to peak gradient 42 mmHg)    Patient Profile     77 y.o. male status post AVR/CABG.    Assessment & Plan    CAD with prior PCI,   non-ST elevation myocardial infarction:  Post CABG.   (L to LAD, SVT OM).  AVR with Inspiris pericardial valve.   Elevated troponin is non specific in this situation post op.    Hyperlipidemia with LDL goal < 70:  Continue Crestor.     Severe AS:  Limited views on echo yesterday.   AVR stable.     LBBB:  Chronic.  Acute SOB:    Elevated enzymes.  No obvious abnormality on repeat echo   Weight has been up.  BP is marginal and on midodrine.  Getting low doses of Bumex.   CVP is 15.    I/O appears to be net negative.   Agree some lung congestion on CXR with atelectasis.  Agree with continued gentle diuresis with aggressive diaphoresis.      AKI:  Creat is improving.      For questions or updates, please contact [place] Please consult [email or web address] for contact info under Cardiology/STEMI.   Signed, Minus Breeding, MD  07/15/2022, 7:52 AM

## 2022-07-15 NOTE — Progress Notes (Signed)
NAME:  Craig Lawson, MRN:  [id number], DOB:  [birth year], LOS: 18 ADMISSION DATE:  07/03/2022, CONSULTATION DATE:  07/11/2022 REFERRING MD: Yolanda Manges, CHIEF COMPLAINT: Postcardiac surgery care.  History of Present Illness:  77 year old man who underwent uneventful bioprosthetic AVR and two-vessel bypass.  He was admitted 8 days ago for crescendo angina resulting in NSTEMI.  Found also to have moderate to severe aortic stenosis as well as two-vessel coronary disease.  Deemed to be at high risk for subsequent MACE, so kept in hospital for urgent revascularization.  Required Plavix washout. Jehovah's Witness.  Refuses blood and albumin.  Did receive preoperative erythropoietin.  Pertinent  Medical History   Past Medical History:  Diagnosis Date   Aortic stenosis 08/10/2015   Bilateral leg edema 08/22/2015   Bradycardia 06/01/2017   Cellulitis of left lower extremity 04/29/2017   Coronary artery disease involving native coronary artery 10/22/2017   DOE (dyspnea on exertion) 05/04/2017   Essential hypertension 08/13/2015   Heart murmur 04/29/2017   Kissing, osteophytes 08/22/2015   LBBB (left bundle branch block) 08/22/2015   Lipid disorder 11/30/2017   Lumbar facet joint pain 08/22/2015   Medicare annual wellness visit, subsequent 06/02/2016   Morbid obesity ([place]) 10/22/2017   Murmur 04/29/2017   Myopathy 09/02/2019   Formatting of this note might be different from the original. Secondary to high freq statin use.   Need for vaccination for Strep pneumoniae 03/27/2017   Non-ST elevation myocardial infarction (NSTEMI), subendocardial infarction, subsequent episode of care ([place]) 11/30/2017   Obesity, Class II, BMI 35-39.9 08/10/2015   OSA on CPAP 12/02/2017   Pure hypercholesterolemia 05/24/2015   Seborrheic keratoses 03/27/2017   Overview:  Of the right forehead.   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 EKG and echo stable. Started on HAP antibiotics.   Interim History / Subjective:  Tmax 99.4. Has still been coughing up thick sputum today. Breathing is improved.   Objective   Blood pressure (!) 96/32, pulse 78, temperature 99.4 F (37.4 C), temperature source Axillary, resp. rate 19, height 5\' 9"  (1.753 m), weight 120.4 kg, SpO2 97 %. CVP:  [7 mmHg-15 mmHg] 15 mmHg  FiO2 (%):  [40 %] 40 %   Intake/Output Summary (Last 24 hours) at 07/15/2022 0705 Last data filed at 07/15/2022 0700 Gross per 24 hour  Intake 1207.63 ml  Output 3530 ml  Net -2322.37 ml    Filed Weights   07/13/22 0500 07/14/22 0500 07/15/22 0500  Weight: 120.8 kg 121.9 kg 120.4 kg    Examination: General: ill appearing man sitting up in the chair in NAD HENT: [place]/AT, eyes anicteric Lungs: breathing comfortably on [place], bibasilar rhales Cardiovascular: midline incision healing well, S1S2, RRR Abdomen: soft, NT Extremities: + edema, no cyanosis Neuro: awake and alert, moving all extremities Derm: mild diaphoresis,  no rashes  Na+  133 BUN 46 Cr 1.43 Trop 1829> 1451 Sputum> abundant PMNs, GPC PCT 0.58> 0.42  Ancillary tests personally reviewed:    Assessment & Plan:  Jehova's witness -does not want albumin or blood products  Critically ill following AVR CABG x 2 vessels Coronary artery  disease status post revascularization Severe aortic stenosis status post valve replacement -bumex BID -midodrine 5 mg TID -aspirin, statin, zetia -holding metoprolol until BP improves -post-op pain control per protocol; multimodal pain control with  gabapentin -progressive mobility  Acute respiratory failure with hypoxia, LLL pneumonia -Can d/c vanc, switch to cefepime to cover non-MRSA GPC. Has h/o rash to PCN, never tried cephalosporins that he is aware of.  AKI, hypervolemic hyponatremia, both improving -strict I/o -renally dose meds, avoid nephrotoxic meds -diuresis  Dyslipidemia -statin, zetia  History of hypertension -holding metoprolol -con't midodrine 5mg  TID  Anemia, iron deficiency Consumptive thrombocytopenia -iron supplements -no transfusions-- patient is Jehova's witness  OSA intolerant of CPAP -trial of nocturnal CPAP   Best Practice (right click and "Reselect all SmartList Selections" daily)   Diet/type: Regular diet DVT prophylaxis: Enoxaparin.    GI prophylaxis: N/A Lines: Central line Foley:  removal ordered  Code Status:  full code Last date of multidisciplinary goals of care discussion [wife updated 3/25]  Julian Hy, DO 07/15/22 8:32 AM  Pulmonary & Critical Care  For contact information, see Amion. If no response to pager, please call PCCM consult pager. After hours, 7PM- 7AM, please call [place].

## 2022-07-15 NOTE — Progress Notes (Signed)
TCTS DAILY ICU PROGRESS NOTE                   [address]            [place] [address]          [phone]   4 Days Post-Op Procedure(s) (LRB): CORONARY ARTERY BYPASS GRAFTING (CABG) X TWO, USING LEFT INTERNAL MAMMARY AND RIGHT LEG GREATER SAPHENOUS VEIN HARVESTED ENDOSCOPICALLY (N/A) AORTIC VALVE REPLACEMENT (AVR) USING INSPIRIS VALVE SIZE 25MM (N/A) TRANSESOPHAGEAL ECHOCARDIOGRAM (N/A)  Total Length of Stay:  LOS: 12 days   Subjective: Feels fairly well  Objective: Vital signs in last 24 hours: Temp:  [97.6 F (36.4 C)-99.4 F (37.4 C)] 99.4 F (37.4 C) (03/25 2000) Pulse Rate:  [62-78] 78 (03/26 0700) Cardiac Rhythm: Normal sinus rhythm;Bundle branch block (03/25 1600) Resp:  [14-28] 19 (03/26 0700) BP: (92-123)/(32-78) 96/32 (03/26 0700) SpO2:  [94 %-100 %] 97 % (03/26 0700) FiO2 (%):  [40 %] 40 % (03/25 1936) Weight:  [120.4 kg] 120.4 kg (03/26 0500)  Filed Weights   07/13/22 0500 07/14/22 0500 07/15/22 0500  Weight: 120.8 kg 121.9 kg 120.4 kg    Weight change: -1.482 kg   Hemodynamic parameters for last 24 hours: CVP:  [7 mmHg-15 mmHg] 15 mmHg  Intake/Output from previous day: 03/25 0701 - 03/26 0700 In: 1207.6 [P.O.:480; I.V.:434.9; IV Piggyback:282.8] Out: 3530 [Urine:3530]  Intake/Output this shift: No intake/output data recorded.  Current Meds: Scheduled Meds:  acetaminophen  1,000 mg Oral Q6H   Or   acetaminophen (TYLENOL) oral liquid 160 mg/5 mL  1,000 mg Per Tube Q6H   aspirin EC  325 mg Oral Daily   Or   aspirin  324 mg Per Tube Daily   bisacodyl  10 mg Oral Daily   Or   bisacodyl  10 mg Rectal Daily   bumetanide (BUMEX) IV  1 mg Intravenous Once   Chlorhexidine Gluconate Cloth  6 each Topical Q0600   docusate sodium  200 mg Oral Daily   enoxaparin (LOVENOX) injection  40 mg Subcutaneous QHS   ezetimibe  10 mg Oral QHS   ferrous sulfate  325 mg Oral Q breakfast   gabapentin  100 mg Oral BID   insulin aspart  0-24  Units Subcutaneous TID WC   midodrine  5 mg Oral TID WC   pantoprazole  40 mg Oral Daily   potassium chloride  20 mEq Oral Daily   rosuvastatin  20 mg Oral Daily   sodium chloride flush  3 mL Intravenous Q12H   Continuous Infusions:  sodium chloride Stopped (07/12/22 2110)   aztreonam Stopped (07/15/22 0121)   lactated ringers     lactated ringers 20 mL/hr at 07/15/22 0700   vancomycin Stopped (07/14/22 1954)   PRN Meds:.metoprolol tartrate, morphine injection, ondansetron (ZOFRAN) IV, mouth rinse, oxyCODONE, sodium chloride flush, traMADol  General appearance: alert, cooperative, and no distress Heart: regular rate and rhythm and occas extrasystole Lungs: dim L>R base Abdomen: benign Extremities: + edema LE's Wound: incis healing well  Lab Results: CBC: Recent Labs    07/13/22 0415 07/14/22 0407  WBC 17.1* 12.6*  HGB 11.4* 10.4*  HCT 33.4* 29.5*  PLT 119* 114*   BMET:  Recent Labs    07/14/22 0407 07/15/22 0424  NA 128* 133*  K 3.9 4.1  CL 95* 97*  CO2 27 28  GLUCOSE 104* 104*  BUN 52* 46*  CREATININE 1.90* 1.43*  CALCIUM 7.9* 8.0*  CMET: Lab Results  Component Value Date   WBC 12.6 (H) 07/14/2022   HGB 10.4 (L) 07/14/2022   HCT 29.5 (L) 07/14/2022   PLT 114 (L) 07/14/2022   GLUCOSE 104 (H) 07/15/2022   CHOL 184 07/04/2022   TRIG 196 (H) 07/04/2022   HDL 37 (L) 07/04/2022   LDLCALC 108 (H) 07/04/2022   ALT 37 07/04/2022   AST 26 07/04/2022   NA 133 (L) 07/15/2022   K 4.1 07/15/2022   CL 97 (L) 07/15/2022   CREATININE 1.43 (H) 07/15/2022   BUN 46 (H) 07/15/2022   CO2 28 07/15/2022   TSH 4.966 (H) 07/04/2022   INR 1.3 (H) 07/11/2022   HGBA1C 5.3 07/10/2022      PT/INR: No results for input(s): "LABPROT", "INR" in the last 72 hours. Radiology: ECHOCARDIOGRAM LIMITED  Result Date: 07/14/2022    ECHOCARDIOGRAM LIMITED REPORT   Patient Name:   BRAXSTON BELLEW Date of Exam: 07/14/2022 Medical Rec #:  [id number]     Height:       69.0 in Accession  #:    [id number]    Weight:       268.7 lb Date of Birth:  May 23, 1945      BSA:          2.342 m Patient Age:    77 years      BP:           102/57 mmHg Patient Gender: M             HR:           65 bpm. Exam Location:  Inpatient Procedure: 2D Echo, Intracardiac Opacification Agent, Cardiac Doppler and Color            Doppler Indications:    Dyspnea  History:        Patient has prior history of Echocardiogram examinations, most                 recent 07/05/2022. Prior CABG and Prior Cardiac Surgery.  Sonographer:    Marella Chimes Referring Phys: [id number] [place] 1. Technically difficult study with very limited visualization of cardiac structures. 2. On contrasted views, LV function appears normal with an estimated EF of 55%. There is paradoxical motion of the interventricular septum due to underlying LBBB. 3. There is a well seated bioprosthetic valve in the aortic position with no signs of paravalvular regurgitation. 4. No pericardial effusion visualized. FINDINGS  Left Ventricle: Left ventricular ejection fraction, by estimation, is 50 to 55%. The left ventricle has low normal function. Abnormal (paradoxical) septal motion consistent with post-operative status and abnormal (paradoxical) septal motion, consistent with left bundle branch block. Left ventricular diastolic parameters are consistent with Grade II diastolic dysfunction (pseudonormalization). Left ventricular diastolic function could not be evaluated due to indeterminate diastolic function. Right Ventricle: The right ventricular size is normal. No increase in right ventricular wall thickness. Right ventricular systolic function is low normal. Left Atrium: Left atrial size was not well visualized. Right Atrium: Right atrial size was not well visualized. Pericardium: There is no evidence of pericardial effusion. Mitral Valve: The mitral valve was not well visualized. Tricuspid Valve: The tricuspid valve is not well visualized. Aortic Valve:  The aortic valve has been repaired/replaced. Aortic valve mean gradient measures 11.0 mmHg. Aortic valve peak gradient measures 23.0 mmHg. Aortic valve area, by VTI measures 2.62 cm. There is a bioprosthetic valve present in the aortic position. Pulmonic Valve: The pulmonic valve was  not assessed. Venous: The inferior vena cava was not well visualized. LEFT VENTRICLE PLAX 2D LVIDd:         5.30 cm   Diastology LVIDs:         4.30 cm   LV e' medial:    3.26 cm/s LV PW:         1.10 cm   LV E/e' medial:  27.9 LV IVS:        1.10 cm   LV e' lateral:   5.00 cm/s LVOT diam:     2.40 cm   LV E/e' lateral: 18.2 LV SV:         112 LV SV Index:   48 LVOT Area:     4.52 cm  RIGHT VENTRICLE RV S prime:     7.62 cm/s TAPSE (M-mode): 1.0 cm LEFT ATRIUM           Index        RIGHT ATRIUM           Index LA Vol (A4C): 43.0 ml 18.36 ml/m  RA Area:     14.60 cm                                    RA Volume:   31.50 ml  13.45 ml/m  AORTIC VALVE AV Area (Vmax):    2.70 cm AV Area (Vmean):   2.91 cm AV Area (VTI):     2.62 cm AV Vmax:           240.00 cm/s AV Vmean:          150.000 cm/s AV VTI:            0.427 m AV Peak Grad:      23.0 mmHg AV Mean Grad:      11.0 mmHg LVOT Vmax:         143.00 cm/s LVOT Vmean:        96.400 cm/s LVOT VTI:          0.247 m LVOT/AV VTI ratio: 0.58  AORTA Ao Root diam: 3.50 cm Ao Asc diam:  3.40 cm MITRAL VALVE MV Area (PHT): 4.60 cm    SHUNTS MV Decel Time: 165 msec    Systemic VTI:  0.25 m MV E velocity: 90.80 cm/s  Systemic Diam: 2.40 cm MV A velocity: 83.40 cm/s MV E/A ratio:  1.09 Aditya Sabharwal Electronically signed by Hebert Soho Signature Date/Time: 07/14/2022/6:00:23 PM    Final    DG CHEST PORT 1 VIEW  Result Date: 07/14/2022 CLINICAL DATA:  Hypoxia.  Worsening chest pain on left side. EXAM: PORTABLE CHEST 1 VIEW COMPARISON:  07/13/2022. FINDINGS: 1424 hours. Right IJ approach vascular sheath remains in place. Stable postoperative changes of median sternotomy and aortic  valve replacement. Low lung volumes accentuate the pulmonary vasculature and cardiomediastinal silhouette. Unchanged linear atelectasis throughout the left-greater-than-right lungs. Probable small left pleural effusion. No pneumothorax. IMPRESSION: Low lung volumes with scattered linear atelectasis throughout the left-greater-than-right lungs and probable small left pleural effusion. Electronically Signed   By: Emmit Alexanders M.D.   On: 07/14/2022 14:33     Assessment/Plan: S/P Procedure(s) (LRB): CORONARY ARTERY BYPASS GRAFTING (CABG) X TWO, USING LEFT INTERNAL MAMMARY AND RIGHT LEG GREATER SAPHENOUS VEIN HARVESTED ENDOSCOPICALLY (N/A) AORTIC VALVE REPLACEMENT (AVR) USING INSPIRIS VALVE SIZE 25MM (N/A) TRANSESOPHAGEAL ECHOCARDIOGRAM (N/A)  POD#4  1 Tmax 99.4, VSS, sinus w/BBB 2 sats ok  on BiPap-has been weaned to 3 liters [place],  now on abx for poss pneumonia- PCCM managing, procalcitonin .42 3 good diuresis, will give additional bumex today 4 BS well controlled 5 hyponatremia- improved w/ sodium 133 from 128 yesterday 6 AKI- BUN/creat trend improved to 46/1.43 from peak of 41/2.19 7 Trop I- elevated, trending lower- echo obtained yesterday , results reviewed(see above) 8 routine pulm hygiene and rehab modalities    John Giovanni PA-C Pager [phone] 07/15/2022 7:38 AM

## 2022-07-15 NOTE — Progress Notes (Signed)
Pharmacy Antibiotic Note  Craig Lawson is a 77 y.o. male admitted on 07/03/2022 with pneumonia.  Pharmacy has been consulted for vancomycin and aztreonam dosing.  WBC trending down to 12.6. PCT 0.58. Tmax/24 hr was 99.9. CXR showing scattered linear atelectasis. Now on BiPAP. Has PCN allergy (rash in 30-40s but recurred with re-trial) - no records of cephalosporin use. Patient reports allergy was rash with no HIVES or anaphylaxis. CrCl is 55 ml/min.  Plan: Stop Aztreonam and vancomycin Start cefepime 2g every 12 hours  Height: 5\' 9"  (175.3 cm) Weight: 120.4 kg (265 lb 6.9 oz) IBW/kg (Calculated) : 70.7  Temp (24hrs), Avg:98.4 F (36.9 C), Min:97.6 F (36.4 C), Max:99.4 F (37.4 C)  Recent Labs  Lab 07/11/22 1843 07/12/22 0311 07/12/22 1555 07/13/22 0415 07/14/22 0407 07/15/22 0424  WBC 21.8* 18.6* 16.6* 17.1* 12.6*  --   CREATININE 1.10 1.43* 1.93* 2.19* 1.90* 1.43*     Estimated Creatinine Clearance: 54.6 mL/min (A) (by C-G formula based on SCr of 1.43 mg/dL (H)).    Allergies  Allergen Reactions   Penicillins     Had rash in his 30s-40s with this as well as recurrence when re-trialed   Statins Other (See Comments)    Muscle pains; increasing with continued use.   Muscle pains; increasing with continued use.    Isosorbide Other (See Comments)    "makes me dizzy and I have headaches"   Latex Rash    Has noticed with bandages in past, leaves a sore on skin but more recently with APAP mask     Antimicrobials this admission: Levofloxacin 3/22 >> 3/23 (surg ppx) Vanc 3/22, 3/25 >> 3/26 Aztreonam 3/25 >>3/26 Cefepime 3/26>>  Dose adjustments this admission: N/A  Microbiology results: 3/25 MRSA PCR: neg  3/25 Sputum: sent  3/20 MRSA PCR: neg  Thank you for involving pharmacy in this patient's care.  Reatha Harps, PharmD PGY2 Pharmacy Resident 07/15/2022 2:03 PM

## 2022-07-15 NOTE — Progress Notes (Signed)
Called Dr. Lavonna Monarch regarding frequent ectopy (PVCs) and pairs of VT.  Orders given and initiated.  BMP sent stat as requested per MD. Patient c/o of chest burning.   VVS HR 76..blood pressure 116/67.

## 2022-07-16 ENCOUNTER — Inpatient Hospital Stay (HOSPITAL_COMMUNITY): Payer: Medicare Other

## 2022-07-16 DIAGNOSIS — J189 Pneumonia, unspecified organism: Secondary | ICD-10-CM | POA: Diagnosis not present

## 2022-07-16 DIAGNOSIS — E871 Hypo-osmolality and hyponatremia: Secondary | ICD-10-CM

## 2022-07-16 HISTORY — DX: Hypo-osmolality and hyponatremia: E87.1

## 2022-07-16 LAB — GLUCOSE, CAPILLARY
Glucose-Capillary: 98 mg/dL (ref 70–99)
Glucose-Capillary: 92 mg/dL (ref 70–99)
Glucose-Capillary: 113 mg/dL — ABNORMAL HIGH (ref 70–99)
Glucose-Capillary: 116 mg/dL — ABNORMAL HIGH (ref 70–99)
Glucose-Capillary: 103 mg/dL — ABNORMAL HIGH (ref 70–99)

## 2022-07-16 LAB — BASIC METABOLIC PANEL
Anion gap: 8 (ref 5–15)
Anion gap: 10 (ref 5–15)
BUN: 37 mg/dL — ABNORMAL HIGH (ref 8–23)
BUN: 39 mg/dL — ABNORMAL HIGH (ref 8–23)
CO2: 28 mmol/L (ref 22–32)
CO2: 27 mmol/L (ref 22–32)
Calcium: 8 mg/dL — ABNORMAL LOW (ref 8.9–10.3)
Calcium: 7.9 mg/dL — ABNORMAL LOW (ref 8.9–10.3)
Chloride: 97 mmol/L — ABNORMAL LOW (ref 98–111)
Chloride: 99 mmol/L (ref 98–111)
Creatinine, Ser: 1.25 mg/dL — ABNORMAL HIGH (ref 0.61–1.24)
Creatinine, Ser: 1.27 mg/dL — ABNORMAL HIGH (ref 0.61–1.24)
GFR, Estimated: 59 mL/min — ABNORMAL LOW (ref >60)
GFR, Estimated: 58 mL/min — ABNORMAL LOW (ref >60)
Glucose, Bld: 109 mg/dL — ABNORMAL HIGH (ref 70–99)
Glucose, Bld: 104 mg/dL — ABNORMAL HIGH (ref 70–99)
Potassium: 4.2 mmol/L (ref 3.5–5.1)
Potassium: 4.2 mmol/L (ref 3.5–5.1)
Sodium: 133 mmol/L — ABNORMAL LOW (ref 135–145)
Sodium: 136 mmol/L (ref 135–145)

## 2022-07-16 LAB — CBC
HCT: 28.2 % — ABNORMAL LOW (ref 39.0–52.0)
Hemoglobin: 9.7 g/dL — ABNORMAL LOW (ref 13.0–17.0)
MCH: 31.6 pg (ref 26.0–34.0)
MCHC: 34.4 g/dL (ref 30.0–36.0)
MCV: 91.9 fL (ref 80.0–100.0)
Platelets: 146 10*3/uL — ABNORMAL LOW (ref 150–400)
RBC: 3.07 MIL/uL — ABNORMAL LOW (ref 4.22–5.81)
RDW: 14.3 % (ref 11.5–15.5)
WBC: 7.4 10*3/uL (ref 4.0–10.5)
nRBC: 0 % (ref 0.0–0.2)

## 2022-07-16 LAB — MAGNESIUM
Magnesium: 2.5 mg/dL — ABNORMAL HIGH (ref 1.7–2.4)
Magnesium: 2.3 mg/dL (ref 1.7–2.4)

## 2022-07-16 MED ORDER — AMIODARONE HCL IN DEXTROSE 360-4.14 MG/200ML-% IV SOLN
30.0000 mg/h | INTRAVENOUS | Status: DC
  Administered 2022-07-16 (×2): 30 mg/h via INTRAVENOUS
  Filled 2022-07-16: qty 200
  Filled 2022-07-16: qty 400

## 2022-07-16 MED ORDER — FUROSEMIDE 10 MG/ML IJ SOLN
40.0000 mg | Freq: Two times a day (BID) | INTRAMUSCULAR | Status: DC
  Administered 2022-07-16 (×2): 40 mg via INTRAVENOUS
  Filled 2022-07-16 (×2): qty 4

## 2022-07-16 MED ORDER — AMIODARONE HCL IN DEXTROSE 360-4.14 MG/200ML-% IV SOLN
60.0000 mg/h | INTRAVENOUS | Status: AC
  Administered 2022-07-16: 60 mg/h via INTRAVENOUS

## 2022-07-16 MED ORDER — AMIODARONE LOAD VIA INFUSION
150.0000 mg | Freq: Once | INTRAVENOUS | Status: AC
  Administered 2022-07-16: 150 mg via INTRAVENOUS
  Filled 2022-07-16: qty 83.34

## 2022-07-16 MED ORDER — METOPROLOL TARTRATE 25 MG PO TABS
25.0000 mg | ORAL_TABLET | Freq: Two times a day (BID) | ORAL | Status: DC
  Administered 2022-07-16 – 2022-07-22 (×12): 25 mg via ORAL
  Filled 2022-07-16 (×13): qty 1

## 2022-07-16 NOTE — TOC Progression Note (Signed)
Transition of Care [facility]) - Progression Note    Patient Details  Name: Craig Lawson MRN: [id number] Date of Birth: 1945-07-03  Transition of Care [facility]) CM/SW Contact  Tom-Johnson, Renea Ee, RN Phone Number: 07/16/2022, 3:12 PM  Clinical Narrative:     Patient is day 5 Post CABG. Per Cardiologist, patient is slow to progress post op. C/O difficulty with breathing and mild chest pains. Diuresing today with IV Lasix BID. On IV abx, Amiodarone. No TOC needs or recommendations noted at this time. CM will continue to follow as patient progresses with care towards discharge.          Expected Discharge Plan: [place] Barriers to Discharge: Continued Medical Work up  Expected Discharge Plan and Services In-house Referral: NA Discharge Planning Services: CM Consult   Living arrangements for the past 2 months: Single Family Home                                       Social Determinants of Health (SDOH) Interventions SDOH Screenings   Food Insecurity: No Food Insecurity (07/04/2022)  Housing: Low Risk  (07/04/2022)  Transportation Needs: No Transportation Needs (07/04/2022)  Utilities: Not At Risk (07/04/2022)  Tobacco Use: Medium Risk (07/12/2022)    Readmission Risk Interventions     No data to display

## 2022-07-16 NOTE — Progress Notes (Signed)
NAME:  Craig Lawson, MRN:  [id number], DOB:  Feb 07, 1946, LOS: 41 ADMISSION DATE:  07/03/2022, CONSULTATION DATE:  07/11/2022 REFERRING MD: Yolanda Manges, CHIEF COMPLAINT: Postcardiac surgery care.  History of Present Illness:  77 year old man who underwent uneventful bioprosthetic AVR and two-vessel bypass.  He was admitted 8 days ago for crescendo angina resulting in NSTEMI.  Found also to have moderate to severe aortic stenosis as well as two-vessel coronary disease.  Deemed to be at high risk for subsequent MACE, so kept in hospital for urgent revascularization.  Required Plavix washout. Jehovah's Witness.  Refuses blood and albumin.  Did receive preoperative erythropoietin.  Pertinent  Medical History   Past Medical History:  Diagnosis Date   Aortic stenosis 08/10/2015   Bilateral leg edema 08/22/2015   Bradycardia 06/01/2017   Cellulitis of left lower extremity 04/29/2017   Coronary artery disease involving native coronary artery 10/22/2017   DOE (dyspnea on exertion) 05/04/2017   Essential hypertension 08/13/2015   Heart murmur 04/29/2017   Kissing, osteophytes 08/22/2015   LBBB (left bundle branch block) 08/22/2015   Lipid disorder 11/30/2017   Lumbar facet joint pain 08/22/2015   Medicare annual wellness visit, subsequent 06/02/2016   Morbid obesity ([place]) 10/22/2017   Murmur 04/29/2017   Myopathy 09/02/2019   Formatting of this note might be different from the original. Secondary to high freq statin use.   Need for vaccination for Strep pneumoniae 03/27/2017   Non-ST elevation myocardial infarction (NSTEMI), subendocardial infarction, subsequent episode of care ([place]) 11/30/2017   Obesity, Class II, BMI 35-39.9 08/10/2015   OSA on CPAP 12/02/2017   Pure hypercholesterolemia 05/24/2015   Seborrheic keratoses 03/27/2017   Overview:  Of the right forehead.   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 EKG and echo stable. Started on HAP antibiotics.   Interim History / Subjective:  Less sputum today.  Breathing is stable.  Afebrile overnight.  Having episodes of bigeminy overnight with dropped beats.  Objective   Blood pressure 115/65, pulse 73, temperature 99.8 F (37.7 C), temperature source Oral, resp. rate (!) 22, height 5\' 9"  (1.753 m), weight 117.8 kg, SpO2 94 %. CVP:  [7 mmHg-15 mmHg] 13 mmHg  FiO2 (%):  [40 %] 40 %   Intake/Output Summary (Last 24 hours) at 07/16/2022 1738 Last data filed at 07/16/2022 1700 Gross per 24 hour  Intake 1300.15 ml  Output 2700 ml  Net -1399.85 ml    Filed Weights   07/14/22 0500 07/15/22 0500 07/16/22 0436  Weight: 121.9 kg 120.4 kg 117.8 kg    Examination: General: Elderly man sitting up in bed in no acute distress HENT: [place] AT, eyes anicteric Lungs: Breathing comfortably on nasal cannula, rales improving in the bases Cardiovascular: S1-S2, regular rate and rhythm-paced rhythm today, no murmurs Abdomen: Soft soft, nontender Extremities: Persistent edema, slowly improving-most notable in the upper extremities.  No cyanosis. Neuro: Awake and alert, answering questions appropriately.  Moving all extremities. Derm: Warm, dry, no rashes  Na+  136 BUN 39 Cr 1.27 Sputum> abundant PMNs, GPC>  CXR personally reviewed-left lower lobe infiltrate, bibasilar atelectasis.  Ancillary tests personally  reviewed:    Assessment & Plan:  Jehova's witness -does not want albumin or blood products  Critically ill following AVR CABG x 2 vessels LBBB Coronary artery disease status post  revascularization Severe aortic stenosis status post valve replacement -Continue diuresis-Lasix twice daily - No longer requiring midodrine -Cardiac pacing per T CTS - Aspirin, statin, Zetia -Metoprolol 25 twice daily -Also modal pain regimen-gabapentin, pain meds per protocol - Progressive mobility  Acute respiratory failure with hypoxia, LLL pneumonia -Continue cefepime, de-escalate if cultures allow, otherwise plan for 7-day course.  AKI, hypervolemic hyponatremia, both improving -Strict I's/O - Renally dose meds and avoid nephrotoxic meds - Continue diuresis  Dyslipidemia -Zetia, statin  History of hypertension -No longer requiring midodrine, metoprolol twice daily. -Hold PTA olmesartan for now  Anemia, iron deficiency Consumptive thrombocytopenia -Iron supplements -no transfusions-- patient is Jehova's witness  OSA intolerant of CPAP -CPAP nightly   Best Practice (right click and "Reselect all SmartList Selections" daily)   Diet/type: Regular diet DVT prophylaxis: Enoxaparin    GI prophylaxis: N/A Lines: Central line Foley:  N/A Code Status:  full code Last date of multidisciplinary goals of care discussion [wife updated 3/25]  Julian Hy, DO 07/16/22 5:38 PM [facility]  For contact information, see Amion. If no response to pager, please call PCCM consult pager. After hours, 7PM- 7AM, please call Elink.

## 2022-07-16 NOTE — Progress Notes (Signed)
TCTS Progress Note: 5 Days Post-Op Procedure(s) (LRB): CORONARY ARTERY BYPASS GRAFTING (CABG) X TWO, USING LEFT INTERNAL MAMMARY AND RIGHT LEG GREATER SAPHENOUS VEIN HARVESTED ENDOSCOPICALLY (N/A) AORTIC VALVE REPLACEMENT (AVR) USING INSPIRIS VALVE SIZE 25MM (N/A) TRANSESOPHAGEAL ECHOCARDIOGRAM (N/A)  LOS: 13 days   Amio gtt 0.5 AAI 80 Nasal cannula     Latest Ref Rng & Units 07/16/2022    1:42 AM 07/14/2022    4:07 AM 07/13/2022    4:15 AM  CBC  WBC 4.0 - 10.5 K/uL 7.4  12.6  17.1   Hemoglobin 13.0 - 17.0 g/dL 9.7  10.4  11.4   Hematocrit 39.0 - 52.0 % 28.2  29.5  33.4   Platelets 150 - 400 K/uL 146  114  119        Latest Ref Rng & Units 07/16/2022    5:06 AM 07/16/2022    1:42 AM 07/15/2022    6:30 PM  CMP  Glucose 70 - 99 mg/dL 104  109  93   BUN 8 - 23 mg/dL 39  37  39   Creatinine 0.61 - 1.24 mg/dL 1.27  1.25  1.27   Sodium 135 - 145 mmol/L 136  133  135   Potassium 3.5 - 5.1 mmol/L 4.2  4.2  4.1   Chloride 98 - 111 mmol/L 99  97  99   CO2 22 - 32 mmol/L 27  28  27    Calcium 8.9 - 10.3 mg/dL 7.9  8.0  7.8     ABG    Component Value Date/Time   PHART 7.338 (L) 07/11/2022 2214   PCO2ART 43.1 07/11/2022 2214   PO2ART 95 07/11/2022 2214   HCO3 23.0 07/11/2022 2214   TCO2 24 07/11/2022 2214   ACIDBASEDEF 3.0 (H) 07/11/2022 2214   O2SAT 96 07/11/2022 2214    FiO2 (%):  [40 %] 40 %

## 2022-07-16 NOTE — Progress Notes (Signed)
[address]       [place] [address]             [phone]      5 Days Post-Op  Procedure(s) (LRB): CORONARY ARTERY BYPASS GRAFTING (CABG) X TWO, USING LEFT INTERNAL MAMMARY AND RIGHT LEG GREATER SAPHENOUS VEIN HARVESTED ENDOSCOPICALLY (N/A) AORTIC VALVE REPLACEMENT (AVR) USING INSPIRIS VALVE SIZE 25MM (N/A) TRANSESOPHAGEAL ECHOCARDIOGRAM (N/A)   Total Length of Stay:  LOS: 13 days    SUBJECTIVE: Having difficult time getting breath Feels bipap dries his mouth out too much Had bigeminy throughout the night  Vitals:   07/16/22 0515 07/16/22 0530  BP: 105/63 (!) 124/49  Pulse: 65 65  Resp: (!) 23 17  Temp:    SpO2: 97% 98%    Intake/Output      03/26 0701 03/27 0700 03/27 0701 03/28 0700   P.O. 700    I.V. (mL/kg) 697.5 (5.9)    Other     IV Piggyback 350    Total Intake(mL/kg) 1747.5 (14.8)    Urine (mL/kg/hr) 2700 (1)    Stool 0    Total Output 2700    Net -952.5         Stool Occurrence 1 x        sodium chloride Stopped (07/12/22 2110)   amiodarone 30 mg/hr (07/16/22 0626)   ceFEPime (MAXIPIME) IV Stopped (07/15/22 2334)   lactated ringers     lactated ringers 20 mL/hr at 07/16/22 0500    CBC    Component Value Date/Time   WBC 7.4 07/16/2022 0142   RBC 3.07 (L) 07/16/2022 0142   HGB 9.7 (L) 07/16/2022 0142   HGB 17.3 07/25/2019 1519   HCT 28.2 (L) 07/16/2022 0142   HCT 48.8 07/25/2019 1519   PLT 146 (L) 07/16/2022 0142   PLT 166 07/25/2019 1519   MCV 91.9 07/16/2022 0142   MCV 90 07/25/2019 1519   MCH 31.6 07/16/2022 0142   MCHC 34.4 07/16/2022 0142   RDW 14.3 07/16/2022 0142   RDW 14.0 07/25/2019 1519   CMP     Component Value Date/Time   NA 136 07/16/2022 0506   NA 141 11/28/2019 1401   K 4.2 07/16/2022 0506   CL 99 07/16/2022 0506   CO2 27 07/16/2022 0506   GLUCOSE 104 (H) 07/16/2022 0506   BUN 39 (H) 07/16/2022 0506   BUN 12 11/28/2019 1401   CREATININE 1.27 (H) 07/16/2022 0506   CALCIUM 7.9 (L)  07/16/2022 0506   PROT 6.0 (L) 07/04/2022 0258   PROT 6.8 11/09/2018 1056   ALBUMIN 3.6 07/04/2022 0258   ALBUMIN 4.6 11/09/2018 1056   AST 26 07/04/2022 0258   ALT 37 07/04/2022 0258   ALKPHOS 21 (L) 07/04/2022 0258   BILITOT 1.2 07/04/2022 0258   BILITOT 0.7 11/09/2018 1056   GFRNONAA 58 (L) 07/16/2022 0506   GFRAA 99 11/28/2019 1401   ABG    Component Value Date/Time   PHART 7.338 (L) 07/11/2022 2214   PCO2ART 43.1 07/11/2022 2214   PO2ART 95 07/11/2022 2214   HCO3 23.0 07/11/2022 2214   TCO2 24 07/11/2022 2214   ACIDBASEDEF 3.0 (H) 07/11/2022 2214   O2SAT 96 07/11/2022 2214   CBG (last 3)  Recent Labs    07/15/22 1611 07/15/22 2120 07/16/22 0631  GLUCAP 103* 117* 98  EXAM Lungs: very decreased Card:RR without murmur Ext: warm and edematous Neuro: awake and alert   ASSESSMENT: POD #5 sp AVR/CABG Hemodynamics  improved with apacing. Will stop amiodarone and start bb and observe rhythm Lasix bid today ISB and will check CXR this am    Coralie Common, MD 07/16/2022

## 2022-07-16 NOTE — Progress Notes (Signed)
Progress Note  Patient Name: Craig Lawson Date of Encounter: 07/16/2022  Primary Cardiologist:   Shirlee More, MD   Subjective   He reports continued chest pain, difficulty taking a deep breath, difficulty with mask last night  Inpatient Medications    Scheduled Meds:  acetaminophen  1,000 mg Oral Q6H   Or   acetaminophen (TYLENOL) oral liquid 160 mg/5 mL  1,000 mg Per Tube Q6H   aspirin EC  325 mg Oral Daily   Or   aspirin  324 mg Per Tube Daily   bisacodyl  10 mg Oral Daily   Or   bisacodyl  10 mg Rectal Daily   Chlorhexidine Gluconate Cloth  6 each Topical Q0600   docusate sodium  200 mg Oral Daily   enoxaparin (LOVENOX) injection  40 mg Subcutaneous QHS   ezetimibe  10 mg Oral QHS   ferrous sulfate  325 mg Oral Q breakfast   furosemide  40 mg Intravenous BID   gabapentin  100 mg Oral BID   insulin aspart  0-24 Units Subcutaneous TID WC   metoprolol tartrate  25 mg Oral BID   pantoprazole  40 mg Oral Daily   potassium chloride  20 mEq Oral Daily   rosuvastatin  20 mg Oral Daily   sodium chloride flush  3 mL Intravenous Q12H   Continuous Infusions:  sodium chloride Stopped (07/12/22 2110)   amiodarone 60 mg/hr (07/16/22 0956)   Followed by   amiodarone     ceFEPime (MAXIPIME) IV 2 g (07/16/22 0952)   lactated ringers     lactated ringers Stopped (07/16/22 0751)   PRN Meds: metoprolol tartrate, morphine injection, ondansetron (ZOFRAN) IV, mouth rinse, oxyCODONE, sodium chloride flush, traMADol   Vital Signs    Vitals:   07/16/22 0630 07/16/22 0645 07/16/22 0700 07/16/22 0800  BP: (!) 119/58 116/60 111/68 135/78  Pulse: (!) 37 (!) 36 (!) 38 78  Resp: 15 (!) 9 15 (!) 22  Temp:   98.5 F (36.9 C)   TempSrc:   Oral   SpO2: 98% 98% 99% 97%  Weight:      Height:        Intake/Output Summary (Last 24 hours) at 07/16/2022 1041 Last data filed at 07/16/2022 1001 Gross per 24 hour  Intake 1065.34 ml  Output 2025 ml  Net -959.66 ml   Filed Weights    07/14/22 0500 07/15/22 0500 07/16/22 0436  Weight: 121.9 kg 120.4 kg 117.8 kg    Telemetry     PAF, atrial pacing  - Personally Reviewed  ECG    NA - Personally Reviewed  Physical Exam   GEN: No  acute distress.   Neck: No  JVD Cardiac: RRR, no murmurs, rubs, or gallops.  Respiratory:    Decreased breath sounds with diffuse wheezing GI: Soft, nontender, non-distended, normal bowel sounds  MS:  Moderate bilateral leg edema; No deformity.   Labs    Chemistry Recent Labs  Lab 07/15/22 1830 07/16/22 0142 07/16/22 0506  NA 135 133* 136  K 4.1 4.2 4.2  CL 99 97* 99  CO2 27 28 27   GLUCOSE 93 109* 104*  BUN 39* 37* 39*  CREATININE 1.27* 1.25* 1.27*  CALCIUM 7.8* 8.0* 7.9*  GFRNONAA 58* 59* 58*  ANIONGAP 9 8 10      Hematology Recent Labs  Lab 07/13/22 0415 07/14/22 0407 07/16/22 0142  WBC 17.1* 12.6* 7.4  RBC 3.62* 3.29* 3.07*  HGB 11.4* 10.4* 9.7*  HCT  33.4* 29.5* 28.2*  MCV 92.3 89.7 91.9  MCH 31.5 31.6 31.6  MCHC 34.1 35.3 34.4  RDW 14.3 14.4 14.3  PLT 119* 114* 146*    Cardiac EnzymesNo results for input(s): "TROPONINI" in the last 168 hours. No results for input(s): "TROPIPOC" in the last 168 hours.   BNP Recent Labs  Lab 07/13/22 1745  BNP 235.1*     DDimer No results for input(s): "DDIMER" in the last 168 hours.   Radiology    DG CHEST PORT 1 VIEW  Result Date: 07/16/2022 CLINICAL DATA:  Status post aortic valve replacement EXAM: PORTABLE CHEST 1 VIEW COMPARISON:  Chest x-ray dated July 15, 2022 FINDINGS: Cardiac and mediastinal contours are unchanged. Stable position of right IJ line sheath. Unchanged diffuse interstitial opacities and bibasilar atelectasis. Probable trace bilateral pleural effusions. No evidence of pneumothorax. IMPRESSION: Unchanged diffuse interstitial opacities, likely pulmonary edema. Electronically Signed   By: Yetta Glassman M.D.   On: 07/16/2022 08:08   DG CHEST PORT 1 VIEW  Result Date: 07/15/2022 CLINICAL  DATA:  Respiratory distress EXAM: PORTABLE CHEST 1 VIEW COMPARISON:  07/14/2022 FINDINGS: Cardiac shadow remains enlarged. Postsurgical changes are again seen. Right jugular sheath is noted but kinked at the skin surface stable from the prior exam. Overall inspiratory effort is poor with basilar atelectasis. Crowding of the vascular markings is again noted. No bony abnormality is seen. IMPRESSION: Stable appearance of the chest when compare with the prior exam. Electronically Signed   By: Inez Catalina M.D.   On: 07/15/2022 22:42   ECHOCARDIOGRAM LIMITED  Result Date: 07/14/2022    ECHOCARDIOGRAM LIMITED REPORT   Patient Name:   Craig Lawson Date of Exam: 07/14/2022 Medical Rec #:  [id number]     Height:       69.0 in Accession #:    [id number]    Weight:       268.7 lb Date of Birth:  1945-08-08      BSA:          2.342 m Patient Age:    77 years      BP:           102/57 mmHg Patient Gender: M             HR:           65 bpm. Exam Location:  Inpatient Procedure: 2D Echo, Intracardiac Opacification Agent, Cardiac Doppler and Color            Doppler Indications:    Dyspnea  History:        Patient has prior history of Echocardiogram examinations, most                 recent 07/05/2022. Prior CABG and Prior Cardiac Surgery.  Sonographer:    Marella Chimes Referring Phys: [id number] Prien 1. Technically difficult study with very limited visualization of cardiac structures. 2. On contrasted views, LV function appears normal with an estimated EF of 55%. There is paradoxical motion of the interventricular septum due to underlying LBBB. 3. There is a well seated bioprosthetic valve in the aortic position with no signs of paravalvular regurgitation. 4. No pericardial effusion visualized. FINDINGS  Left Ventricle: Left ventricular ejection fraction, by estimation, is 50 to 55%. The left ventricle has low normal function. Abnormal (paradoxical) septal motion consistent with post-operative status and abnormal  (paradoxical) septal motion, consistent with left bundle branch block. Left ventricular diastolic parameters are consistent with Grade II diastolic  dysfunction (pseudonormalization). Left ventricular diastolic function could not be evaluated due to indeterminate diastolic function. Right Ventricle: The right ventricular size is normal. No increase in right ventricular wall thickness. Right ventricular systolic function is low normal. Left Atrium: Left atrial size was not well visualized. Right Atrium: Right atrial size was not well visualized. Pericardium: There is no evidence of pericardial effusion. Mitral Valve: The mitral valve was not well visualized. Tricuspid Valve: The tricuspid valve is not well visualized. Aortic Valve: The aortic valve has been repaired/replaced. Aortic valve mean gradient measures 11.0 mmHg. Aortic valve peak gradient measures 23.0 mmHg. Aortic valve area, by VTI measures 2.62 cm. There is a bioprosthetic valve present in the aortic position. Pulmonic Valve: The pulmonic valve was not assessed. Venous: The inferior vena cava was not well visualized. LEFT VENTRICLE PLAX 2D LVIDd:         5.30 cm   Diastology LVIDs:         4.30 cm   LV e' medial:    3.26 cm/s LV PW:         1.10 cm   LV E/e' medial:  27.9 LV IVS:        1.10 cm   LV e' lateral:   5.00 cm/s LVOT diam:     2.40 cm   LV E/e' lateral: 18.2 LV SV:         112 LV SV Index:   48 LVOT Area:     4.52 cm  RIGHT VENTRICLE RV S prime:     7.62 cm/s TAPSE (M-mode): 1.0 cm LEFT ATRIUM           Index        RIGHT ATRIUM           Index LA Vol (A4C): 43.0 ml 18.36 ml/m  RA Area:     14.60 cm                                    RA Volume:   31.50 ml  13.45 ml/m  AORTIC VALVE AV Area (Vmax):    2.70 cm AV Area (Vmean):   2.91 cm AV Area (VTI):     2.62 cm AV Vmax:           240.00 cm/s AV Vmean:          150.000 cm/s AV VTI:            0.427 m AV Peak Grad:      23.0 mmHg AV Mean Grad:      11.0 mmHg LVOT Vmax:         143.00 cm/s  LVOT Vmean:        96.400 cm/s LVOT VTI:          0.247 m LVOT/AV VTI ratio: 0.58  AORTA Ao Root diam: 3.50 cm Ao Asc diam:  3.40 cm MITRAL VALVE MV Area (PHT): 4.60 cm    SHUNTS MV Decel Time: 165 msec    Systemic VTI:  0.25 m MV E velocity: 90.80 cm/s  Systemic Diam: 2.40 cm MV A velocity: 83.40 cm/s MV E/A ratio:  1.09 Aditya Sabharwal Electronically signed by Hebert Soho Signature Date/Time: 07/14/2022/6:00:23 PM    Final    DG CHEST PORT 1 VIEW  Result Date: 07/14/2022 CLINICAL DATA:  Hypoxia.  Worsening chest pain on left side. EXAM: PORTABLE CHEST 1 VIEW COMPARISON:  07/13/2022. FINDINGS: 1424 hours.  Right IJ approach vascular sheath remains in place. Stable postoperative changes of median sternotomy and aortic valve replacement. Low lung volumes accentuate the pulmonary vasculature and cardiomediastinal silhouette. Unchanged linear atelectasis throughout the left-greater-than-right lungs. Probable small left pleural effusion. No pneumothorax. IMPRESSION: Low lung volumes with scattered linear atelectasis throughout the left-greater-than-right lungs and probable small left pleural effusion. Electronically Signed   By: Emmit Alexanders M.D.   On: 07/14/2022 14:33    Cardiac Studies    Echo 07/05/22:  1. The aortic valve is calcified. There is severe calcifcation of the  aortic valve. There is moderate thickening of the aortic valve. Aortic  valve regurgitation is mild. Moderate aortic valve stenosis. Aortic valve  mean gradient measures 28.0 mmHg.  Aortic valve Vmax measures 3.63 m/s.   2. Left ventricular ejection fraction, by estimation, is 60 to 65%. The  left ventricle has normal function. The left ventricle has no regional  wall motion abnormalities. Left ventricular diastolic parameters are  consistent with Grade I diastolic  dysfunction (impaired relaxation).   3. Right ventricular systolic function is normal. The right ventricular  size is normal.   4. Left atrial size was  mildly dilated.   5. The mitral valve is normal in structure. No evidence of mitral valve  regurgitation. No evidence of mitral stenosis.   6. The inferior vena cava is normal in size with greater than 50%  respiratory variability, suggesting right atrial pressure of 3 mmHg.    Echo 07/14/2022:    1. Technically difficult study with very limited visualization of cardiac  structures.  2. On contrasted views, LV function appears normal with an estimated EF of  55%. There is paradoxical motion of the interventricular septum due to  underlying LBBB.  3. There is a well seated bioprosthetic valve in the aortic position with  no signs of paravalvular regurgitation.  4. No pericardial effusion visualized. FINDINGS   Left Ventricle: Left ventricular ejection fraction, by estimation, is 50  to 55%. The left ventricle has low normal function. Abnormal (paradoxical)  septal motion consistent with post-operative status and abnormal  (paradoxical) septal motion, consistent  with left bundle branch block. Left ventricular diastolic parameters are  consistent with Grade II diastolic dysfunction (pseudonormalization). Left  ventricular diastolic function could not be evaluated due to indeterminate  diastolic function.   LHC 07/04/22:   Dist RCA lesion is 50% stenosed.   2nd Diag lesion is 25% stenosed.   1st Diag lesion is 75% stenosed.   RPDA lesion is 30% stenosed.   RPAV lesion is 60% stenosed.   Prox LAD to Mid LAD lesion is 20% stenosed.   Ost Cx to Prox Cx lesion is 90% stenosed.   Mid LM to Ost LAD lesion is 80% stenosed.   Ost LAD lesion is 80% stenosed.   Previously placed 1st Mrg stent of unknown type is  widely patent.   Severe distal left main stenosis (IVUS minimum lumen area of 4.0 mm2) Severe ostial LAD stenosis (IVUD minimum lumen area of 3.5 mm2). Patent mid LAD stent.  Severe ostial Circumflex stenosis. Patent obtuse marginal stent Large dominant RCA with moderate distal  stenosis, patent PDA stent, moderate posterolateral artery stenosis.  Severe aortic stenosis by echo (Cath data: mean gradient 32.8 mmHg, peak to peak gradient 42 mmHg)    Patient Profile     77 y.o. male status post AVR/CABG.    Assessment & Plan    CAD with prior PCI,  non-ST elevation myocardial infarction:  Post CABG.   (L to LAD, SVT OM).  AVR with Inspiris pericardial valve.   Slow progress post op   Hyperlipidemia with LDL goal < 70:  Continue Crestor.     Severe AS:  Limited views on echo yesterday.   AVR stable on echo 06/2022.     Rhythm:     Sinus brady, PAF atrial fib, PVCs,   Chronic LBBB.  Amio stopped then restarted secondary to recurrent fib.  Will transition to PO when able.   Started metoprolol PO.  Currently atrial paced at 80.    Acute SOB:      Edema on CXR.  Intake and output incomplete.  Received one dose of Bumex yesterday.  Lasix IV bid ordered.  Agree with diuresis.  Creat is stable.  Talked with nursing about PT, pulmonary toilet.   AKI:  Creat is stable.      For questions or updates, please contact [place] Please consult [email or web address] for contact info under Cardiology/STEMI.   Signed, Minus Breeding, MD  07/16/2022, 10:41 AM

## 2022-07-17 ENCOUNTER — Inpatient Hospital Stay (HOSPITAL_COMMUNITY): Payer: Medicare Other

## 2022-07-17 DIAGNOSIS — R600 Localized edema: Secondary | ICD-10-CM | POA: Diagnosis not present

## 2022-07-17 DIAGNOSIS — J189 Pneumonia, unspecified organism: Secondary | ICD-10-CM | POA: Diagnosis not present

## 2022-07-17 LAB — BASIC METABOLIC PANEL
Anion gap: 8 (ref 5–15)
BUN: 31 mg/dL — ABNORMAL HIGH (ref 8–23)
CO2: 30 mmol/L (ref 22–32)
Calcium: 7.8 mg/dL — ABNORMAL LOW (ref 8.9–10.3)
Chloride: 97 mmol/L — ABNORMAL LOW (ref 98–111)
Creatinine, Ser: 1.18 mg/dL (ref 0.61–1.24)
GFR, Estimated: >60 mL/min (ref >60)
Glucose, Bld: 123 mg/dL — ABNORMAL HIGH (ref 70–99)
Potassium: 3.9 mmol/L (ref 3.5–5.1)
Sodium: 135 mmol/L (ref 135–145)

## 2022-07-17 LAB — GLUCOSE, CAPILLARY
Glucose-Capillary: 115 mg/dL — ABNORMAL HIGH (ref 70–99)
Glucose-Capillary: 100 mg/dL — ABNORMAL HIGH (ref 70–99)
Glucose-Capillary: 141 mg/dL — ABNORMAL HIGH (ref 70–99)
Glucose-Capillary: 76 mg/dL (ref 70–99)

## 2022-07-17 LAB — CULTURE, RESPIRATORY W GRAM STAIN: Culture: NORMAL

## 2022-07-17 MED ORDER — AMIODARONE HCL 200 MG PO TABS
200.0000 mg | ORAL_TABLET | Freq: Two times a day (BID) | ORAL | Status: DC
  Administered 2022-07-17 – 2022-07-18 (×4): 200 mg via ORAL
  Filled 2022-07-17 (×4): qty 1

## 2022-07-17 MED ORDER — FUROSEMIDE 10 MG/ML IJ SOLN
40.0000 mg | Freq: Every day | INTRAMUSCULAR | Status: DC
  Administered 2022-07-17 – 2022-07-21 (×5): 40 mg via INTRAVENOUS
  Filled 2022-07-17 (×5): qty 4

## 2022-07-17 MED FILL — Lidocaine HCl Local Preservative Free (PF) Inj 2%: INTRAMUSCULAR | Qty: 14 | Status: AC

## 2022-07-17 MED FILL — Magnesium Sulfate Inj 50%: INTRAMUSCULAR | Qty: 10 | Status: AC

## 2022-07-17 MED FILL — Potassium Chloride Inj 2 mEq/ML: INTRAVENOUS | Qty: 40 | Status: AC

## 2022-07-17 MED FILL — Heparin Sodium (Porcine) Inj 1000 Unit/ML: Qty: 1000 | Status: AC

## 2022-07-17 NOTE — Progress Notes (Signed)
[facility] [address]       [place] [address]             [phone]      6 Days Post-Op  Procedure(s) (LRB): CORONARY ARTERY BYPASS GRAFTING (CABG) X TWO, USING LEFT INTERNAL MAMMARY AND RIGHT LEG GREATER SAPHENOUS VEIN HARVESTED ENDOSCOPICALLY (N/A) AORTIC VALVE REPLACEMENT (AVR) USING INSPIRIS VALVE SIZE 25MM (N/A) TRANSESOPHAGEAL ECHOCARDIOGRAM (N/A)   Total Length of Stay:  LOS: 14 days    SUBJECTIVE: Feels better this am. Ambulated Was Apaced all night  Vitals:   07/17/22 0000 07/17/22 0500  BP: 111/80   Pulse: 79   Resp: (!) 21   Temp:  100.1 F (37.8 C)  SpO2: 98%     Intake/Output      03/27 0701 03/28 0700 03/28 0701 03/29 0700   P.O.     I.V. (mL/kg) 419 (3.6)    IV Piggyback 200.1    Total Intake(mL/kg) 619.1 (5.3)    Urine (mL/kg/hr) 4750 (1.7)    Stool 0    Total Output 4750    Net -4130.9         Stool Occurrence 2 x        sodium chloride Stopped (07/12/22 2110)   amiodarone 30 mg/hr (07/16/22 2200)   ceFEPime (MAXIPIME) IV Stopped (07/16/22 2149)   lactated ringers     lactated ringers Stopped (07/16/22 0751)    CBC    Component Value Date/Time   WBC 7.4 07/16/2022 0142   RBC 3.07 (L) 07/16/2022 0142   HGB 9.7 (L) 07/16/2022 0142   HGB 17.3 07/25/2019 1519   HCT 28.2 (L) 07/16/2022 0142   HCT 48.8 07/25/2019 1519   PLT 146 (L) 07/16/2022 0142   PLT 166 07/25/2019 1519   MCV 91.9 07/16/2022 0142   MCV 90 07/25/2019 1519   MCH 31.6 07/16/2022 0142   MCHC 34.4 07/16/2022 0142   RDW 14.3 07/16/2022 0142   RDW 14.0 07/25/2019 1519   CMP     Component Value Date/Time   NA 135 07/17/2022 0426   NA 141 11/28/2019 1401   K 3.9 07/17/2022 0426   CL 97 (L) 07/17/2022 0426   CO2 30 07/17/2022 0426   GLUCOSE 123 (H) 07/17/2022 0426   BUN 31 (H) 07/17/2022 0426   BUN 12 11/28/2019 1401   CREATININE 1.18 07/17/2022 0426   CALCIUM 7.8 (L) 07/17/2022 0426   PROT 6.0 (L) 07/04/2022 0258   PROT 6.8 11/09/2018 1056    ALBUMIN 3.6 07/04/2022 0258   ALBUMIN 4.6 11/09/2018 1056   AST 26 07/04/2022 0258   ALT 37 07/04/2022 0258   ALKPHOS 21 (L) 07/04/2022 0258   BILITOT 1.2 07/04/2022 0258   BILITOT 0.7 11/09/2018 1056   GFRNONAA >60 07/17/2022 0426   GFRAA 99 11/28/2019 1401   ABG    Component Value Date/Time   PHART 7.338 (L) 07/11/2022 2214   PCO2ART 43.1 07/11/2022 2214   PO2ART 95 07/11/2022 2214   HCO3 23.0 07/11/2022 2214   TCO2 24 07/11/2022 2214   ACIDBASEDEF 3.0 (H) 07/11/2022 2214   O2SAT 96 07/11/2022 2214   CBG (last 3)  Recent Labs    07/16/22 1531 07/16/22 2123 07/17/22 0628  GLUCAP 116* 103* 115*  EXAM Lungs: overall clear Card: RR with soft syst murmur Ext: warm and less edematous Neuro: alert and awake   ASSESSMENT: POD #6 sp AVR/CABG Hemodynamics good on apacing. Will stop and let in own rhythm today  Change to oral amiodarone Cont diuresis Ambulate Leave in unit today Remove sheath   Coralie Common, MD 07/17/2022

## 2022-07-17 NOTE — Progress Notes (Signed)
NAME:  Craig Lawson, MRN:  [id number], DOB:  Sep 18, 1945, LOS: 22 ADMISSION DATE:  07/03/2022, CONSULTATION DATE:  07/11/2022 REFERRING MD: Yolanda Manges, CHIEF COMPLAINT: Postcardiac surgery care.  History of Present Illness:  77 year old man who underwent uneventful bioprosthetic AVR and two-vessel bypass.  He was admitted 8 days ago for crescendo angina resulting in NSTEMI.  Found also to have moderate to severe aortic stenosis as well as two-vessel coronary disease.  Deemed to be at high risk for subsequent MACE, so kept in hospital for urgent revascularization.  Required Plavix washout. Jehovah's Witness.  Refuses blood and albumin.  Did receive preoperative erythropoietin.  Pertinent  Medical History   Past Medical History:  Diagnosis Date   Aortic stenosis 08/10/2015   Bilateral leg edema 08/22/2015   Bradycardia 06/01/2017   Cellulitis of left lower extremity 04/29/2017   Coronary artery disease involving native coronary artery 10/22/2017   DOE (dyspnea on exertion) 05/04/2017   Essential hypertension 08/13/2015   Heart murmur 04/29/2017   Kissing, osteophytes 08/22/2015   LBBB (left bundle branch block) 08/22/2015   Lipid disorder 11/30/2017   Lumbar facet joint pain 08/22/2015   Medicare annual wellness visit, subsequent 06/02/2016   Morbid obesity ([place]) 10/22/2017   Murmur 04/29/2017   Myopathy 09/02/2019   Formatting of this note might be different from the original. Secondary to high freq statin use.   Need for vaccination for Strep pneumoniae 03/27/2017   Non-ST elevation myocardial infarction (NSTEMI), subendocardial infarction, subsequent episode of care ([place]) 11/30/2017   Obesity, Class II, BMI 35-39.9 08/10/2015   OSA on CPAP 12/02/2017   Pure hypercholesterolemia 05/24/2015   Seborrheic keratoses 03/27/2017   Overview:  Of the right forehead.   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 EKG and echo stable. Started on HAP antibiotics.   Interim History / Subjective:  Mild sputum production, denies complaints otherwise.   Objective   Blood pressure 111/80, pulse 79, temperature 100.1 F (37.8 C), temperature source Oral, resp. rate (!) 21, height 5\' 9"  (1.753 m), weight 117.8 kg, SpO2 98 %. CVP:  [10 mmHg-15 mmHg] 10 mmHg      Intake/Output Summary (Last 24 hours) at 07/17/2022 0714 Last data filed at 07/17/2022 0500 Gross per 24 hour  Intake 619.09 ml  Output 4750 ml  Net -4130.91 ml    Filed Weights   07/15/22 0500 07/16/22 0436 07/17/22 0500  Weight: 120.4 kg 117.8 kg 117.8 kg    Examination: General: elderly man sitting up in the chair in NAD HENT:  [place]/AT, eyes anicteric Lungs: breathing comfortably on [place], improved basilar rhales, no wheezing Cardiovascular: S1S2, RRR Abdomen: soft, NT Extremities: improving edema Neuro: awake, alert, moving all extremities. Derm: warm, dry, no rashes   BUN 30 Cr 1.18 Sputum> abundant PMNs, GPC>pending  CXR personally reviewed- bibasilar atelectasis, LLL infiltrate improving  Ancillary tests personally reviewed:  Hypervolemic hyponatremia AKI  Assessment & Plan:  Jehova's witness -does not want albumin or blood products  Critically ill following AVR CABG x 2 vessels LBBB Coronary artery disease status post revascularization Severe aortic stenosis status post valve replacement -Continue diuresis-  lasix 1-2 times daily -can d/c CVC -aspirin, statin, zetia -metoprolol BID -pain meds per protocol - advance mobility  Acute respiratory failure with hypoxia,  LLL pneumonia -Cefepime, 5 day course  Dyslipidemia -statin, zetia  History of hypertension -metoprolol BID -can resume PTA olmesartan when BP can tolerate  Anemia, iron deficiency Consumptive thrombocytopenia -iron supplements -no transfusions  OSA intolerant of CPAP as OP -CPAP nightly   Best Practice (right click and "Reselect all SmartList Selections" daily)   Diet/type: Regular diet DVT prophylaxis: Enoxaparin    GI prophylaxis: N/A Lines: Central line Foley:  N/A Code Status:  full code Last date of multidisciplinary goals of care discussion [wife updated 3/25]  Julian Hy, DO 07/17/22 10:33 AM [facility]  For contact information, see Amion. If no response to pager, please call PCCM consult pager. After hours, 7PM- 7AM, please call [place].

## 2022-07-17 NOTE — Progress Notes (Signed)
Patient ID: Craig Lawson, male   DOB: Jan 17, 1946, 77 y.o.   MRN: [id number] TCTS Evening Rounds:  Hemodynamically stable in sinus rhythm.  Sats 93%   Excellent UO.  Ambulated today.

## 2022-07-18 ENCOUNTER — Other Ambulatory Visit: Payer: Self-pay | Admitting: Cardiology

## 2022-07-18 ENCOUNTER — Inpatient Hospital Stay (HOSPITAL_COMMUNITY): Payer: Medicare Other

## 2022-07-18 DIAGNOSIS — I350 Nonrheumatic aortic (valve) stenosis: Secondary | ICD-10-CM

## 2022-07-18 DIAGNOSIS — I214 Non-ST elevation (NSTEMI) myocardial infarction: Secondary | ICD-10-CM | POA: Diagnosis not present

## 2022-07-18 LAB — CBC
HCT: 32.3 % — ABNORMAL LOW (ref 39.0–52.0)
Hemoglobin: 11.2 g/dL — ABNORMAL LOW (ref 13.0–17.0)
MCH: 30.9 pg (ref 26.0–34.0)
MCHC: 34.7 g/dL (ref 30.0–36.0)
MCV: 89.2 fL (ref 80.0–100.0)
Platelets: 231 10*3/uL (ref 150–400)
RBC: 3.62 MIL/uL — ABNORMAL LOW (ref 4.22–5.81)
RDW: 13.8 % (ref 11.5–15.5)
WBC: 8.6 10*3/uL (ref 4.0–10.5)
nRBC: 0 % (ref 0.0–0.2)

## 2022-07-18 LAB — GLUCOSE, CAPILLARY
Glucose-Capillary: 110 mg/dL — ABNORMAL HIGH (ref 70–99)
Glucose-Capillary: 96 mg/dL (ref 70–99)
Glucose-Capillary: 94 mg/dL (ref 70–99)

## 2022-07-18 LAB — BASIC METABOLIC PANEL
Anion gap: 12 (ref 5–15)
BUN: 29 mg/dL — ABNORMAL HIGH (ref 8–23)
CO2: 30 mmol/L (ref 22–32)
Calcium: 8.3 mg/dL — ABNORMAL LOW (ref 8.9–10.3)
Chloride: 94 mmol/L — ABNORMAL LOW (ref 98–111)
Creatinine, Ser: 1.25 mg/dL — ABNORMAL HIGH (ref 0.61–1.24)
GFR, Estimated: 59 mL/min — ABNORMAL LOW (ref >60)
Glucose, Bld: 99 mg/dL (ref 70–99)
Potassium: 4 mmol/L (ref 3.5–5.1)
Sodium: 136 mmol/L (ref 135–145)

## 2022-07-18 MED ORDER — SODIUM CHLORIDE 0.9 % IV SOLN
2.0000 g | Freq: Three times a day (TID) | INTRAVENOUS | Status: AC
  Administered 2022-07-18 – 2022-07-19 (×6): 2 g via INTRAVENOUS
  Filled 2022-07-18 (×6): qty 12.5

## 2022-07-18 MED ORDER — SODIUM CHLORIDE 0.9 % IV SOLN: 2.0000 g | Freq: Three times a day (TID) | INTRAVENOUS | Status: DC

## 2022-07-18 NOTE — Progress Notes (Addendum)
[address]       [place], [address]             [phone]      7 Days Post-Op Procedure(s) (LRB): CORONARY ARTERY BYPASS GRAFTING (CABG) X TWO, USING LEFT INTERNAL MAMMARY AND RIGHT LEG GREATER SAPHENOUS VEIN HARVESTED ENDOSCOPICALLY (N/A) AORTIC VALVE REPLACEMENT (AVR) USING INSPIRIS VALVE SIZE 25MM (N/A) TRANSESOPHAGEAL ECHOCARDIOGRAM (N/A)  Subjective:  Patient complains of shortness of breath while lying flat.  Also noticed he got short of breath with ambulation and fatigued easier than yesterday.  + ambulation   + BM  Objective: Vital signs in last 24 hours: Temp:  [99.2 F (37.3 C)] 99.2 F (37.3 C) (03/28 1724) Pulse Rate:  [59-76] 70 (03/29 0700) Cardiac Rhythm: Normal sinus rhythm (03/28 2000) Resp:  [15-32] 22 (03/29 0700) BP: (93-147)/(40-82) 121/69 (03/29 0700) SpO2:  [91 %-97 %] 91 % (03/29 0700) Weight:  [117.6 kg] 117.6 kg (03/29 0500)  Intake/Output from previous day: 03/28 0701 - 03/29 0700 In: 1070.6 [P.O.:720; I.V.:150.6; IV Piggyback:200] Out: 2550 [Urine:2550]  General appearance: alert, cooperative, and no distress Heart: regular rate and rhythm Lungs: diminished breath sounds bibasilar Abdomen: soft, non-tender; bowel sounds normal; no masses,  no organomegaly Extremities: edema pitting  Wound: clean and dry  Lab Results: Recent Labs    07/16/22 0142  WBC 7.4  HGB 9.7*  HCT 28.2*  PLT 146*   BMET:  Recent Labs    07/16/22 0506 07/17/22 0426  NA 136 135  K 4.2 3.9  CL 99 97*  CO2 27 30  GLUCOSE 104* 123*  BUN 39* 31*  CREATININE 1.27* 1.18  CALCIUM 7.9* 7.8*    PT/INR: No results for input(s): "LABPROT", "INR" in the last 72 hours. ABG    Component Value Date/Time   PHART 7.338 (L) 07/11/2022 2214   HCO3 23.0 07/11/2022 2214   TCO2 24 07/11/2022 2214   ACIDBASEDEF 3.0 (H) 07/11/2022 2214   O2SAT 96 07/11/2022 2214   CBG (last 3)  Recent Labs    07/17/22 1719 07/17/22 2214 07/18/22 0658   GLUCAP 141* 76 110*    Assessment/Plan: S/P Procedure(s) (LRB): CORONARY ARTERY BYPASS GRAFTING (CABG) X TWO, USING LEFT INTERNAL MAMMARY AND RIGHT LEG GREATER SAPHENOUS VEIN HARVESTED ENDOSCOPICALLY (N/A) AORTIC VALVE REPLACEMENT (AVR) USING INSPIRIS VALVE SIZE 25MM (N/A) TRANSESOPHAGEAL ECHOCARDIOGRAM (N/A)  CV- NSR, BP is stable- continue Amiodarone, Lopressor.. will d/c EPW Pulm- wean oxygen as tolerated, patient complains of dyspnea.Marland Kitchen CXR w/o significant effusion from 3/28.Marland Kitchen good use of IS ID- continue maxipime for suspected LLL pneumonia Renal- creatinine continues to trend down, remains edematous on exam.. continue IV lasix, potassium Expected post operative anemia- stable at 9.7 (3/27).. 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 will only check blood if need arises CBGs controlled- d/c SSIP Dispo- patient stable, in NSR will d/c EPW today, get PT/OT consult to assess for home needs.. transfer to 4E   LOS: 15 days    Ellwood Handler, PA-C 07/18/2022  DR Tenny Craw ADDENDUM  S/p CAB/AVR.  Ongoing issues  abx tx for PNA  Amio for AF  Sitting up in chair looking good 3+ LE edema.   Plan get labs today XR reviewed, left base mild consolidation   Meds:  ASA: yes DVT ppx: appears not, has SCDs. Given JW status reasonable if Hgb less than 10 to have on ASA alone as long as is ambulatory BB: yes  Daily lasix: yes  Will likely transfer out tomorrow to floor.

## 2022-07-18 NOTE — Procedures (Signed)
Thoracentesis  Procedure Note  Craig Lawson  [id number]  [birth year]  Date:07/18/22  Time:4:10 PM   Provider Performing:Tuwanna Krausz C Tamala Julian   Procedure: Thoracentesis with imaging guidance [id number])  Indication(s) Pleural Effusion  Consent Risks of the procedure as well as the alternatives and risks of each were explained to the patient and/or caregiver.  Consent for the procedure was obtained and is signed in the bedside chart  Anesthesia Topical only with 1% lidocaine    Time Out Verified patient identification, verified procedure, site/side was marked, verified correct patient position, special equipment/implants available, medications/allergies/relevant history reviewed, required imaging and test results available.   Sterile Technique Maximal sterile technique including full sterile barrier drape, hand hygiene, sterile gown, sterile gloves, mask, hair covering, sterile ultrasound probe cover (if used).  Procedure Description Ultrasound was used to identify appropriate pleural anatomy for placement and overlying skin marked.  Area of drainage cleaned and draped in sterile fashion. Lidocaine was used to anesthetize the skin and subcutaneous tissue.  600 cc's of bloody appearing fluid was drained from the left pleural space. Catheter then removed and bandaid applied to site.   Complications/Tolerance None; patient tolerated the procedure well. Chest X-ray is ordered to confirm no post-procedural complication.   EBL Minimal   Specimen(s) None

## 2022-07-18 NOTE — Progress Notes (Signed)
PHARMACY NOTE:  ANTIMICROBIAL RENAL DOSAGE ADJUSTMENT  Current antimicrobial regimen includes a mismatch between antimicrobial dosage and estimated renal function.  As per policy approved by the Pharmacy & Therapeutics and Medical Executive Committees, the antimicrobial dosage will be adjusted accordingly.  Current antimicrobial dosage:  Cefepime 2g every 12 hours  Indication: PNA  Renal Function:  Estimated Creatinine Clearance: 65.3 mL/min (by C-G formula based on SCr of 1.18 mg/dL). []      On intermittent HD, scheduled: []      On CRRT    Antimicrobial dosage has been changed to:  Cefepime every 8 hours  Additional comments: Stop date 5 total days of antibiotics  Thank you for allowing pharmacy to be a part of this patient's care.  Ursula Beath, [facility] 07/18/2022 6:02 AM

## 2022-07-18 NOTE — Evaluation (Addendum)
Physical Therapy Evaluation Patient Details Name: Craig Lawson MRN: [id number] DOB: 11/30/1945 Today's Date: 07/18/2022  History of Present Illness  77 year old man presented to ED with progressive angina 07/03/22 admitted with crescendo angina and suspected NSTEMI who underwent uneventful bioprosthetic AVR and two-vessel bypass 07/11/22. [id number] witness (blood product refusal) with CAD, moderate-severe AS by echo 04/2022, chronic edema, mild dilation of ascending aorta, LBBB, HTN, HLD, morbid obesity with OSA intolerant of CPAP, possible prior TIAs, habitual ETOH use  Clinical Impression  PTA pt was living with his wife in a single story home with level entry. Pt completely independent in mobility, ADLs, and iADLs, driving. Pt is currently limited in safe mobility by sternal precautions and pain, and decreased endurance. Pt is min guard for transfers and supervision for ambulation of 740 feet with EVA walker. Pt will benefit from Cardiac Rehab and will likely not have any PT needs at discharge. PT will continue to follow acutely.   Recommendations for follow up therapy are one component of a multi-disciplinary discharge planning process, led by the attending physician.  Recommendations may be updated based on patient status, additional functional criteria and insurance authorization.     Assistance Recommended at Discharge Intermittent Supervision/Assistance  Patient can return home with the following  Assist for transportation;Assistance with cooking/housework    Equipment Recommendations Rolling walker (2 wheels) (if wife can not find one at home)  Recommendations for Other Services  OT consult    Functional Status Assessment Patient has had a recent decline in their functional status and demonstrates the ability to make significant improvements in function in a reasonable and predictable amount of time.     Precautions / Restrictions Precautions Precautions: Sternal Precaution  Comments: able to recall 2/4 precautions Restrictions Weight Bearing Restrictions: Yes (sternal precautions) RUE Weight Bearing: Non weight bearing LUE Weight Bearing: Non weight bearing      Mobility  Bed Mobility               General bed mobility comments: sitting in recliner on entry    Transfers Overall transfer level: Needs assistance Equipment used:  (EVA walker) Transfers: Sit to/from Stand Sit to Stand: Min guard           General transfer comment: min guard for safety, strong power up with legs and self steadying holding heart pillow, very good eccentric leg control with lower back to recliner    Ambulation/Gait Ambulation/Gait assistance: Supervision Gait Distance (Feet): 740 Feet Assistive device: Ethelene Hal Gait Pattern/deviations: Step-through pattern, WFL(Within Functional Limits) Gait velocity: WFL Gait velocity interpretation: >2.62 ft/sec, indicative of community ambulatory   General Gait Details: supervision for safety, strong steady gait        Balance Overall balance assessment: Mild deficits observed, not formally tested                                           Pertinent Vitals/Pain Pain Assessment Pain Assessment: Faces Faces Pain Scale: Hurts a little bit Pain Location: chest Pain Descriptors / Indicators: Pressure, Sore Pain Intervention(s): Limited activity within patient's tolerance, Monitored during session, Repositioned    Home Living Family/patient expects to be discharged to:: Private residence Living Arrangements: Spouse/significant other Available Help at Discharge: Available 24 hours/day Type of Home: House Home Access: Level entry       Home Layout: One level Home Equipment: Conservation officer, nature (2 wheels) Additional  Comments: could get wheelchair if needed    Prior Function Prior Level of Function : Independent/Modified Independent             Mobility Comments: driving           Extremity/Trunk Assessment   Upper Extremity Assessment Upper Extremity Assessment: Defer to OT evaluation    Lower Extremity Assessment Lower Extremity Assessment: RLE deficits/detail;LLE deficits/detail RLE Deficits / Details: ROM WFL, strength grossly 4/5 LLE Deficits / Details: ROM WFL, strength grossly 4/5       Communication   Communication: No difficulties  Cognition Arousal/Alertness: Awake/alert Behavior During Therapy: WFL for tasks assessed/performed Overall Cognitive Status: Within Functional Limits for tasks assessed                                          General Comments General comments (skin integrity, edema, etc.): VSS on 1L O2 via [place], increased edema in B LE        Assessment/Plan    PT Assessment Patient needs continued PT services  PT Problem List Cardiopulmonary status limiting activity;Decreased mobility       PT Treatment Interventions DME instruction;Gait training;Stair training;Functional mobility training;Balance training;Cognitive remediation;Patient/family education;Therapeutic activities;Therapeutic exercise    PT Goals (Current goals can be found in the Care Plan section)  Acute Rehab PT Goals Patient Stated Goal: get back to playing golf or at least walking on the beach with his wife PT Goal Formulation: With patient Time For Goal Achievement: 08/01/22 Potential to Achieve Goals: Good    Frequency Min 1X/week        AM-PAC PT "6 Clicks" Mobility  Outcome Measure Help needed turning from your back to your side while in a flat bed without using bedrails?: A Little Help needed moving from lying on your back to sitting on the side of a flat bed without using bedrails?: A Little Help needed moving to and from a bed to a chair (including a wheelchair)?: None Help needed standing up from a chair using your arms (e.g., wheelchair or bedside chair)?: None Help needed to walk in hospital room?: None Help needed climbing  3-5 steps with a railing? : A Little 6 Click Score: 21    End of Session Equipment Utilized During Treatment: Gait belt;Oxygen Activity Tolerance: Patient tolerated treatment well Patient left: in chair;with call bell/phone within reach Nurse Communication: Mobility status PT Visit Diagnosis: Difficulty in walking, not elsewhere classified (R26.2)    Time: [id number] PT Time Calculation (min) (ACUTE ONLY): 32 min   Charges:   PT Evaluation $PT Eval Moderate Complexity: 1 Mod PT Treatments $Therapeutic Exercise: 8-22 mins        Lukas Pelcher B. Migdalia Dk PT, DPT Acute Rehabilitation Services Please use secure chat or  Call Office [phone]   [place] 07/18/2022, 9:00 AM

## 2022-07-18 NOTE — Progress Notes (Signed)
NAME:  Craig Lawson, MRN:  [id number], DOB:  17-Jun-1945, LOS: 51 ADMISSION DATE:  07/03/2022, CONSULTATION DATE:  07/11/2022 REFERRING MD: Yolanda Manges, CHIEF COMPLAINT: Postcardiac surgery care.  History of Present Illness:  77 year old man who underwent uneventful bioprosthetic AVR and two-vessel bypass.  He was admitted 8 days ago for crescendo angina resulting in NSTEMI.  Found also to have moderate to severe aortic stenosis as well as two-vessel coronary disease.  Deemed to be at high risk for subsequent MACE, so kept in hospital for urgent revascularization.  Required Plavix washout. Jehovah's Witness.  Refuses blood and albumin.  Did receive preoperative erythropoietin.  Pertinent  Medical History   Past Medical History:  Diagnosis Date   Aortic stenosis 08/10/2015   Bilateral leg edema 08/22/2015   Bradycardia 06/01/2017   Cellulitis of left lower extremity 04/29/2017   Coronary artery disease involving native coronary artery 10/22/2017   DOE (dyspnea on exertion) 05/04/2017   Essential hypertension 08/13/2015   Heart murmur 04/29/2017   Kissing, osteophytes 08/22/2015   LBBB (left bundle branch block) 08/22/2015   Lipid disorder 11/30/2017   Lumbar facet joint pain 08/22/2015   Medicare annual wellness visit, subsequent 06/02/2016   Morbid obesity ([place]) 10/22/2017   Murmur 04/29/2017   Myopathy 09/02/2019   Formatting of this note might be different from the original. Secondary to high freq statin use.   Need for vaccination for Strep pneumoniae 03/27/2017   Non-ST elevation myocardial infarction (NSTEMI), subendocardial infarction, subsequent episode of care ([place]) 11/30/2017   Obesity, Class II, BMI 35-39.9 08/10/2015   OSA on CPAP 12/02/2017   Pure hypercholesterolemia 05/24/2015   Seborrheic keratoses 03/27/2017   Overview:  Of the right forehead.   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 EKG and echo stable. Started on HAP antibiotics.   Interim History / Subjective:  More tachypneic this am. Pulling 750 on IS. Pain is under control.  Objective   Blood pressure 104/61, pulse 68, temperature 98.8 F (37.1 C), temperature source Oral, resp. rate (!) 25, height 5\' 9"  (1.753 m), weight 117.6 kg, SpO2 94 %.        Intake/Output Summary (Last 24 hours) at 07/18/2022 0835 Last data filed at 07/18/2022 0800 Gross per 24 hour  Intake 757.52 ml  Output 2450 ml  Net -1692.48 ml    Filed Weights   07/16/22 0436 07/17/22 0500 07/18/22 0500  Weight: 117.8 kg 117.8 kg 117.6 kg    Examination: No distress [place] 3 MMM, trachea midline 2+ edema Crackles worse on R  BUN 30 Cr 1.18 Sputum still pending  CXR volume loss L>R, L basilar infiltrate vs atelectasis CBC/BMP pending  Ancillary tests personally reviewed:  Hypervolemic hyponatremia AKI  Assessment & Plan:  Jehova's witness -does not want albumin or blood products  Critically ill following AVR CABG x 2 vessels LBBB Coronary artery disease status post revascularization Severe aortic stenosis status post valve replacement - GDMT per primary - Progressive mobility   Acute respiratory failure with hypoxia, LLL pneumonia -Cefepime, 5 day course - Push diuresis, check BMP, CXR - May want to [place] left chest, ?  diaphragm dysfunction/stunning  Dyslipidemia -statin, zetia  History of hypertension -metoprolol BID -can resume PTA olmesartan when BP can tolerate  Anemia, iron deficiency Consumptive thrombocytopenia -iron supplements -no  transfusions  OSA intolerant of CPAP as OP -CPAP nightly   Best Practice (right click and "Reselect all SmartList Selections" daily)   Diet/type: Cardiac diet DVT prophylaxis: Enoxaparin    GI prophylaxis: N/A Lines: Central line Foley:  N/A Code Status:  full code Last date of multidisciplinary goals of care discussion [updated patient at bedside]  Discussed during multidisc rounds w/ Dr. Hebert Soho, MD 07/18/22 8:35 AM [facility]

## 2022-07-19 LAB — BASIC METABOLIC PANEL
Anion gap: 6 (ref 5–15)
BUN: 35 mg/dL — ABNORMAL HIGH (ref 8–23)
CO2: 29 mmol/L (ref 22–32)
Calcium: 8 mg/dL — ABNORMAL LOW (ref 8.9–10.3)
Chloride: 99 mmol/L (ref 98–111)
Creatinine, Ser: 1.21 mg/dL (ref 0.61–1.24)
GFR, Estimated: >60 mL/min (ref >60)
Glucose, Bld: 118 mg/dL — ABNORMAL HIGH (ref 70–99)
Potassium: 4.3 mmol/L (ref 3.5–5.1)
Sodium: 134 mmol/L — ABNORMAL LOW (ref 135–145)

## 2022-07-19 LAB — GLUCOSE, CAPILLARY
Glucose-Capillary: 194 mg/dL — ABNORMAL HIGH (ref 70–99)
Glucose-Capillary: 108 mg/dL — ABNORMAL HIGH (ref 70–99)
Glucose-Capillary: 286 mg/dL — ABNORMAL HIGH (ref 70–99)
Glucose-Capillary: 106 mg/dL — ABNORMAL HIGH (ref 70–99)

## 2022-07-19 MED ORDER — SODIUM CHLORIDE 0.9% FLUSH: 3.0000 mL | INTRAVENOUS | Status: DC | PRN

## 2022-07-19 MED ORDER — AMIODARONE HCL 200 MG PO TABS
200.0000 mg | ORAL_TABLET | Freq: Every day | ORAL | Status: DC
  Administered 2022-07-19 – 2022-07-22 (×4): 200 mg via ORAL
  Filled 2022-07-19 (×4): qty 1

## 2022-07-19 MED ORDER — SODIUM CHLORIDE 0.9% FLUSH
3.0000 mL | Freq: Two times a day (BID) | INTRAVENOUS | Status: DC
  Administered 2022-07-19 – 2022-07-21 (×4): 3 mL via INTRAVENOUS

## 2022-07-19 MED ORDER — SODIUM CHLORIDE 0.9 % IV SOLN: 250.0000 mL | INTRAVENOUS | Status: DC | PRN

## 2022-07-19 MED ORDER — INSULIN ASPART 100 UNIT/ML IJ SOLN: 0.0000 [IU] | Freq: Three times a day (TID) | INTRAMUSCULAR | Status: DC

## 2022-07-19 MED ORDER — ~~LOC~~ CARDIAC SURGERY, PATIENT & FAMILY EDUCATION: Freq: Once | Status: AC

## 2022-07-19 NOTE — Progress Notes (Signed)
CARDIAC REHAB PHASE I   PRE:  Rate/Rhythm: Sinus rhythm 63  BP:    Sitting: 112/64     SaO2: 92% 1l/min ambulated on 2l/min  MODE:  Ambulation: 350 ft   POST:  Rate/Rhythem: 64  BP:    Sitting: 120/71     SaO2: 96% on 2l/min  [id number] Patient ambulated with rolling walker on 2l/min. Tolerated well. Patient assisted back to recliner with call bell within reach used IS to 600. Feet elevated.  Harrell Gave RN

## 2022-07-19 NOTE — Evaluation (Signed)
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 [id number] witness (blood product refusal) with CAD, moderate-severe AS by echo 04/2022, chronic edema, mild dilation of ascending aorta, LBBB, HTN, HLD, morbid obesity with OSA intolerant of CPAP, possible prior TIAs, habitual ETOH use   Clinical Impression   PTA patient independent and driving. Admitted for above and presents with problem list below.  He completes ADLs with setup to mod assist (for LB), transfers with min guard and functional mobility in room using eva walker with min guard.  VSS during session on RA.  Educated on sternal precautions, Adl compensatory techniques, energy conservation techniques, recommendations and safety.  Anticipate pt will progress well acutely, will follow.      Recommendations for follow up therapy are one component of a multi-disciplinary discharge planning process, led by the attending physician.  Recommendations may be updated based on patient status, additional functional criteria and insurance authorization.   Assistance Recommended at Discharge Intermittent Supervision/Assistance  Patient can return home with the following A little help with walking and/or transfers;A lot of help with bathing/dressing/bathroom;Assistance with cooking/housework;Help with stairs or ramp for entrance;Assist for transportation    Functional Status Assessment  Patient has had a recent decline in their functional status and demonstrates the ability to make significant improvements in function in a reasonable and predictable amount of time.  Equipment Recommendations  None recommended by OT    Recommendations for Other  Services       Precautions / Restrictions Precautions Precautions: Sternal Precaution Comments: min cueing to recall, good adherance functionally Restrictions Weight Bearing Restrictions: Yes Other Position/Activity Restrictions: sternal      Mobility Bed Mobility               General bed mobility comments: in recliner    Transfers Overall transfer level: Needs assistance   Transfers: Sit to/from Stand Sit to Stand: Min guard           General transfer comment: cueing for hand on knees to avoid premature reaching for eva walker, powers up with min gaurd to steady      Balance Overall balance assessment: Mild deficits observed, not formally tested                                         ADL either performed or assessed with clinical judgement   ADL Overall ADL's : Needs assistance/impaired     Grooming: Min guard;Oral care;Standing           Upper Body Dressing : Minimal assistance;Sitting   Lower Body Dressing: Moderate assistance;Sit to/from stand Lower Body Dressing Details (indicate cue type and reason): requires assist for socks but would be able to thread pants, min guard in standing Toilet Transfer: Min guard;Ambulation Toilet Transfer Details (indicate cue type and reason): eva walker simulated         Functional mobility during ADLs: Min guard;Cueing for safety       Vision Baseline Vision/History: 1 Wears glasses (reading) Patient Visual Report: No change from baseline Vision Assessment?: No apparent visual deficits     Perception     Praxis      Pertinent Vitals/Pain  Pain Assessment Pain Assessment: Faces Faces Pain Scale: Hurts a little bit Pain Location: chest Pain Descriptors / Indicators: Pressure, Sore Pain Intervention(s): Limited activity within patient's tolerance, Monitored during session, Repositioned     Hand Dominance Left   Extremity/Trunk Assessment Upper Extremity Assessment Upper  Extremity Assessment: Generalized weakness (within sternal precautions, mild edema noted)   Lower Extremity Assessment Lower Extremity Assessment: Defer to PT evaluation       Communication Communication Communication: No difficulties   Cognition Arousal/Alertness: Awake/alert Behavior During Therapy: WFL for tasks assessed/performed Overall Cognitive Status: Within Functional Limits for tasks assessed                                       General Comments  pt on RA with VSS during activity, RN notified remains on RA upon exit    Exercises     Shoulder Instructions      Home Living Family/patient expects to be discharged to:: Private residence Living Arrangements: Spouse/significant other Available Help at Discharge: Available 24 hours/day Type of Home: House Home Access: Level entry     Home Layout: One level     Bathroom Shower/Tub: Occupational psychologist: Standard     Home Equipment: Conservation officer, nature (2 wheels);Shower seat;Grab bars - tub/shower          Prior Functioning/Environment Prior Level of Function : Independent/Modified Independent;Driving               ADLs Comments: enjoys Haematologist, completes iADLs        OT Problem List: Decreased strength;Decreased activity tolerance;Decreased knowledge of precautions;Decreased knowledge of use of DME or AE;Cardiopulmonary status limiting activity;Increased edema      OT Treatment/Interventions: Self-care/ADL training;Therapeutic exercise;DME and/or AE instruction;Energy conservation;Therapeutic activities;Patient/family education;Balance training    OT Goals(Current goals can be found in the care plan section) Acute Rehab OT Goals Patient Stated Goal: get better OT Goal Formulation: With patient Time For Goal Achievement: 08/01/22 Potential to Achieve Goals: Good  OT Frequency: Min 2X/week    Co-evaluation              AM-PAC OT "6 Clicks" Daily Activity      Outcome Measure Help from another person eating meals?: None Help from another person taking care of personal grooming?: A Little Help from another person toileting, which includes using toliet, bedpan, or urinal?: A Lot Help from another person bathing (including washing, rinsing, drying)?: A Lot Help from another person to put on and taking off regular upper body clothing?: A Little Help from another person to put on and taking off regular lower body clothing?: A Lot 6 Click Score: 16   End of Session Equipment Utilized During Treatment: Other (comment) (eva walker) Nurse Communication: Mobility status  Activity Tolerance: Patient tolerated treatment well Patient left: in chair;with call bell/phone within reach  OT Visit Diagnosis: Other abnormalities of gait and mobility (R26.89)                Time: [id number] OT Time Calculation (min): 27 min Charges:  OT General Charges $OT Visit: 1 Visit OT Evaluation $OT Eval Moderate Complexity: 1 Mod OT Treatments $Self Care/Home Management : 8-22 mins  Craig Lawson, OT Acute Rehabilitation Services Office [phone]   Craig Lawson 07/19/2022, 10:48 AM

## 2022-07-19 NOTE — Progress Notes (Signed)
Pt refused BiPAP use at this time, pt states he does not wear CPAP or BiPAP at home. SpO2 is within normal limits at this time. RN made aware.

## 2022-07-19 NOTE — Progress Notes (Signed)
T[facility] Progress Note: 8 Days Post-Op Procedure(s) (LRB): CORONARY ARTERY BYPASS GRAFTING (CABG) X TWO, USING LEFT INTERNAL MAMMARY AND RIGHT LEG GREATER SAPHENOUS VEIN HARVESTED ENDOSCOPICALLY (N/A) AORTIC VALVE REPLACEMENT (AVR) USING INSPIRIS VALVE SIZE 25MM (N/A) TRANSESOPHAGEAL ECHOCARDIOGRAM (N/A)  LOS: 16 days   No issues  1L [place] Sitting up walking laps S/p thora today Off bipap o/n  Meds:  ASA: yes DVT ppx: scds only - is JW and hgb was up to 11 and walking around BB: yes  Lasix yes 40 daily.  Plan Transfer Cont daily diuresis Ongoing issues include abx for PNA - WC better yest On Amio for AF. Changed to qd from bid as HR low 60s.        Latest Ref Rng & Units 07/18/2022   11:21 AM 07/16/2022    1:42 AM 07/14/2022    4:07 AM  CBC  WBC 4.0 - 10.5 K/uL 8.6  7.4  12.6   Hemoglobin 13.0 - 17.0 g/dL 11.2  9.7  10.4   Hematocrit 39.0 - 52.0 % 32.3  28.2  29.5   Platelets 150 - 400 K/uL 231  146  114        Latest Ref Rng & Units 07/19/2022    1:20 AM 07/18/2022   11:21 AM 07/17/2022    4:26 AM  CMP  Glucose 70 - 99 mg/dL 118  99  123   BUN 8 - 23 mg/dL 35  29  31   Creatinine 0.61 - 1.24 mg/dL 1.21  1.25  1.18   Sodium 135 - 145 mmol/L 134  136  135   Potassium 3.5 - 5.1 mmol/L 4.3  4.0  3.9   Chloride 98 - 111 mmol/L 99  94  97   CO2 22 - 32 mmol/L 29  30  30    Calcium 8.9 - 10.3 mg/dL 8.0  8.3  7.8     ABG    Component Value Date/Time   PHART 7.338 (L) 07/11/2022 2214   PCO2ART 43.1 07/11/2022 2214   PO2ART 95 07/11/2022 2214   HCO3 23.0 07/11/2022 2214   TCO2 24 07/11/2022 2214   ACIDBASEDEF 3.0 (H) 07/11/2022 2214   O2SAT 96 07/11/2022 2214    Vent Mode: Stand-by

## 2022-07-20 ENCOUNTER — Inpatient Hospital Stay (HOSPITAL_COMMUNITY): Payer: Medicare Other

## 2022-07-20 LAB — GLUCOSE, CAPILLARY
Glucose-Capillary: 102 mg/dL — ABNORMAL HIGH (ref 70–99)
Glucose-Capillary: 110 mg/dL — ABNORMAL HIGH (ref 70–99)
Glucose-Capillary: 112 mg/dL — ABNORMAL HIGH (ref 70–99)
Glucose-Capillary: 112 mg/dL — ABNORMAL HIGH (ref 70–99)

## 2022-07-20 LAB — CBC
HCT: 29 % — ABNORMAL LOW (ref 39.0–52.0)
Hemoglobin: 9.8 g/dL — ABNORMAL LOW (ref 13.0–17.0)
MCH: 30.6 pg (ref 26.0–34.0)
MCHC: 33.8 g/dL (ref 30.0–36.0)
MCV: 90.6 fL (ref 80.0–100.0)
Platelets: 251 10*3/uL (ref 150–400)
RBC: 3.2 MIL/uL — ABNORMAL LOW (ref 4.22–5.81)
RDW: 13.9 % (ref 11.5–15.5)
WBC: 7.9 10*3/uL (ref 4.0–10.5)
nRBC: 0 % (ref 0.0–0.2)

## 2022-07-20 LAB — ECHO INTRAOPERATIVE TEE
AR max vel: 1.55 cm2
AV Area VTI: 1.38 cm2
AV Area mean vel: 1.44 cm2
AV Mean grad: 20.7 mmHg
AV Peak grad: 32.8 mmHg
Ao pk vel: 2.86 m/s
Height: 69 in
MV VTI: 3.37 cm2
Weight: 3992.97 oz

## 2022-07-20 LAB — BASIC METABOLIC PANEL
Anion gap: 6 (ref 5–15)
BUN: 30 mg/dL — ABNORMAL HIGH (ref 8–23)
CO2: 27 mmol/L (ref 22–32)
Calcium: 7.9 mg/dL — ABNORMAL LOW (ref 8.9–10.3)
Chloride: 100 mmol/L (ref 98–111)
Creatinine, Ser: 1.15 mg/dL (ref 0.61–1.24)
GFR, Estimated: >60 mL/min (ref >60)
Glucose, Bld: 108 mg/dL — ABNORMAL HIGH (ref 70–99)
Potassium: 4.4 mmol/L (ref 3.5–5.1)
Sodium: 133 mmol/L — ABNORMAL LOW (ref 135–145)

## 2022-07-20 NOTE — Progress Notes (Signed)
Patient c/o Shortness of breath. Unable to take deep breathes. SPO2 at 92% Started on oxygen at 2 litres [place], encouraged to use spirometry. Assisted to get out of bed to reclining chair.

## 2022-07-20 NOTE — Progress Notes (Signed)
[address]       [place] [address]             [phone]      9 Days Post-Op Procedure(s) (LRB): CORONARY ARTERY BYPASS GRAFTING (CABG) X TWO, USING LEFT INTERNAL MAMMARY AND RIGHT LEG GREATER SAPHENOUS VEIN HARVESTED ENDOSCOPICALLY (N/A) AORTIC VALVE REPLACEMENT (AVR) USING INSPIRIS VALVE SIZE 25MM (N/A) TRANSESOPHAGEAL ECHOCARDIOGRAM (N/A) Subjective: Transferred from ICU yesterday. Up in the bedside chair, feels more short of breath this morning after being on RA with reasonable O2 sats over night.  Appears comfortable on 2LNC O2 now.   Objective: Vital signs in last 24 hours: Temp:  [98.4 F (36.9 C)-99.6 F (37.6 C)] 98.8 F (37.1 C) (03/31 0813) Pulse Rate:  [58-66] 62 (03/31 0813) Cardiac Rhythm: Normal sinus rhythm;Bundle branch block (03/31 0700) Resp:  [15-28] 20 (03/31 0813) BP: (108-137)/(52-71) 123/71 (03/31 0813) SpO2:  [89 %-96 %] 96 % (03/31 0813)  Hemodynamic parameters for last 24 hours: CVP:  [0 mmHg] 0 mmHg  Intake/Output from previous day: 03/30 0701 - 03/31 0700 In: 240 [P.O.:240] Out: 1900 [Urine:1900] Intake/Output this shift: Total I/O In: -  Out: 400 [Urine:400]  General appearance: alert, cooperative, and mild distress Neurologic: intact Heart: RRR, monitor showing stable SR since arrival to unit.  Lungs: breath sounds clear, shallow. Abdomen: soft, no tenderness Extremities: mild bilateral pre-tibial edema. Wound: the sternotomy incision is intact and dry, LE incision OK.   Lab Results: Recent Labs    07/18/22 1121 07/20/22 0114  WBC 8.6 7.9  HGB 11.2* 9.8*  HCT 32.3* 29.0*  PLT 231 251   BMET:  Recent Labs    07/19/22 0120 07/20/22 0114  NA 134* 133*  K 4.3 4.4  CL 99 100  CO2 29 27  GLUCOSE 118* 108*  BUN 35* 30*  CREATININE 1.21 1.15  CALCIUM 8.0* 7.9*    PT/INR: No results for input(s): "LABPROT", "INR" in the last 72 hours. ABG    Component Value Date/Time   PHART 7.338 (L)  07/11/2022 2214   HCO3 23.0 07/11/2022 2214   TCO2 24 07/11/2022 2214   ACIDBASEDEF 3.0 (H) 07/11/2022 2214   O2SAT 96 07/11/2022 2214   CBG (last 3)  Recent Labs    07/19/22 1635 07/19/22 2111 07/20/22 0602  GLUCAP 286* 106* 102*    Assessment/Plan: S/P Procedure(s) (LRB): CORONARY ARTERY BYPASS GRAFTING (CABG) X TWO, USING LEFT INTERNAL MAMMARY AND RIGHT LEG GREATER SAPHENOUS VEIN HARVESTED ENDOSCOPICALLY (N/A) AORTIC VALVE REPLACEMENT (AVR) USING INSPIRIS VALVE SIZE 25MM (N/A) TRANSESOPHAGEAL ECHOCARDIOGRAM (N/A)  -POD10 AVR / CABG, POD2 left thoracentesis- Transferred fro ICU yesterday. On ASA, metoprolol, Crestor. Progressing with mobility.   -Post-op HCAP- completed 5 day course of Maxipime. WBC 7,900, no cough. F/U CXR.  -Left Pleural Effusion- now s/p left thora with 667ml drained on 3/29.  Feeling more short of breath today and is back on supplemental O2.  Recheck CXR today. Encouraged IS.   -Post-op Atrial Fibrillation- maintaining SR ~60/min on oral amiodarone 200mg  daily. K+ 4.4  --Expected acute blood loss anemia-Hct  29% and reasonably stable. On Fe++ replacement. 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  -GI- tolerating PO's, BM x 2 yesterday.   -Volume excess- current Wt is equal to pre-op Wt but has LE edema. Continue Lasix IV daily.   -Disposition- planning for eventual discharge to home. Rolling walker ordered per PT recs.  LOS: 17 days    Antony Odea, PA-C  07/20/2022

## 2022-07-20 NOTE — Progress Notes (Signed)
Mobility Specialist Progress Note    07/20/22 1454  Mobility  Activity Ambulated with assistance in hallway  Level of Assistance Minimal assist, patient does 75% or more  Assistive Device Front wheel walker  Distance Ambulated (ft) 470 ft  Activity Response Tolerated well  Mobility Referral Yes  $Mobility charge 1 Mobility   Pre-Mobility: 63 HR, 96% SpO2 During Mobility: >/=89% SpO2 Post-Mobility: 71 HR, 96% SpO2  Pt received in bed and agreeable. No complaints on walk. Tolerated on RA. Returned to sitting EOB with call bell in reach.    Hildred Alamin Mobility Specialist  Please Psychologist, sport and exercise or Rehab Office at [phone]

## 2022-07-21 ENCOUNTER — Telehealth: Payer: Self-pay | Admitting: Critical Care Medicine

## 2022-07-21 ENCOUNTER — Ambulatory Visit: Payer: Medicare Other

## 2022-07-21 DIAGNOSIS — I350 Nonrheumatic aortic (valve) stenosis: Secondary | ICD-10-CM

## 2022-07-21 LAB — GLUCOSE, CAPILLARY
Glucose-Capillary: 104 mg/dL — ABNORMAL HIGH (ref 70–99)
Glucose-Capillary: 108 mg/dL — ABNORMAL HIGH (ref 70–99)
Glucose-Capillary: 105 mg/dL — ABNORMAL HIGH (ref 70–99)
Glucose-Capillary: 126 mg/dL — ABNORMAL HIGH (ref 70–99)

## 2022-07-21 MED ORDER — FUROSEMIDE 40 MG PO TABS
40.0000 mg | ORAL_TABLET | Freq: Every day | ORAL | Status: DC
  Administered 2022-07-22: 40 mg via ORAL
  Filled 2022-07-21: qty 1

## 2022-07-21 NOTE — Progress Notes (Signed)
Physical Therapy Treatment Patient Details Name: Craig Lawson MRN: [id number] DOB: 1945/06/13 Today's Date: 07/21/2022   History of Present Illness Pt is a 77 year old man presented to ED with progressive angina 07/03/22 admitted with crescendo angina and suspected NSTEMI who underwent uneventful bioprosthetic AVR and two-vessel bypass 07/11/22. [id number] witness (blood product refusal) with CAD, moderate-severe AS by echo 04/2022, chronic edema, mild dilation of ascending aorta, LBBB, HTN, HLD, morbid obesity with OSA intolerant of CPAP, possible prior TIAs, habitual ETOH use    PT Comments    Pt received in supine, agreeable to therapy session and with good participation and tolerance for bed mobility, transfer training, gait and stair training. Pt needs mostly Supervision with safety cues to optimize body mechanics and for Move in the Tube precautions, but for stair ascent/descent, pt needing up to minA with single rail. SpO2/HR WFL on RA throughout. Pt continues to benefit from PT services to progress toward functional mobility goals.    Recommendations for follow up therapy are one component of a multi-disciplinary discharge planning process, led by the attending physician.  Recommendations may be updated based on patient status, additional functional criteria and insurance authorization.  Follow Up Recommendations       Assistance Recommended at Discharge Intermittent Supervision/Assistance  Patient can return home with the following Assist for transportation;Assistance with cooking/housework   Equipment Recommendations  Rolling walker (2 wheels) (if his wife can't find one at home)    Recommendations for Other Services       Precautions / Restrictions Precautions Precautions: Sternal Precaution Booklet Issued: Yes (comment) Precaution Comments: reviewed with pt Restrictions Other Position/Activity Restrictions: sternal precs/Move in the Tube     Mobility  Bed  Mobility Overal bed mobility: Needs Assistance Bed Mobility: Supine to Sit     Supine to sit: Min guard     General bed mobility comments: bed already elevated into chair posture as pt was eating, defer lowering it prior to sitting up as he was still digesting.    Transfers Overall transfer level: Needs assistance Equipment used: Rolling walker (2 wheels) Transfers: Sit to/from Stand Sit to Stand: Min guard           General transfer comment: cueing for hands on knees with min guard to stay foward while powering up, cues for Move in the Tube if he needs some UE support for eccentric control with stand>sit to/from lower surface heights.    Ambulation/Gait Ambulation/Gait assistance: Supervision Gait Distance (Feet): 300 Feet Assistive device: Rolling walker (2 wheels) Gait Pattern/deviations: Step-through pattern, WFL(Within Functional Limits)       General Gait Details: supervision for safety, strong steady gait, min cues intermittently for keeping elbows tucked in; HR WFL, SpO2 WFL on RA   Stairs Stairs: Yes Stairs assistance: Min guard, Min assist Stair Management: One rail Right, Step to pattern, Forwards Number of Stairs: 2 General stair comments: cues for step sequencing, safety, pt with decreased eccentric control with descending needing minA for stability. Discussion on curb step sequencing and step-up/downs for BLE strengthening as he progresses since pt does not need to do flight of steps at home.       Balance Overall balance assessment: Mild deficits observed, not formally tested                                          Cognition Arousal/Alertness: Awake/alert Behavior During Therapy:  WFL for tasks assessed/performed Overall Cognitive Status: Within Functional Limits for tasks assessed                                          Exercises Other Exercises Other Exercises: cues for ankle pumps hourly and use of IS; pt  pulling ~1,000 mL on IS x 10 reps    General Comments General comments (skin integrity, edema, etc.): SpO2/HR WFL on RA (92%-96% w/exertion). no acute s/sx distress throughout. Pt reports some mild BLE edema, he may benefit from TED hose for swelling, discussed using SCDs in hospital and ankle pumps/increased mobility.      Pertinent Vitals/Pain Pain Assessment Pain Assessment: No/denies pain Pain Intervention(s): Monitored during session, Repositioned     PT Goals (current goals can now be found in the care plan section) Acute Rehab PT Goals Patient Stated Goal: get back to playing golf or at least walking on the beach with his wife PT Goal Formulation: With patient Time For Goal Achievement: 08/01/22 Progress towards PT goals: Progressing toward goals    Frequency    Min 1X/week      PT Plan Current plan remains appropriate       AM-PAC PT "6 Clicks" Mobility   Outcome Measure  Help needed turning from your back to your side while in a flat bed without using bedrails?: A Little Help needed moving from lying on your back to sitting on the side of a flat bed without using bedrails?: A Little Help needed moving to and from a bed to a chair (including a wheelchair)?: A Little Help needed standing up from a chair using your arms (e.g., wheelchair or bedside chair)?: A Little Help needed to walk in hospital room?: A Little (supervision only) Help needed climbing 3-5 steps with a railing? : A Little 6 Click Score: 18    End of Session Equipment Utilized During Treatment: Gait belt Activity Tolerance: Patient tolerated treatment well Patient left: in chair;with call bell/phone within reach Nurse Communication: Mobility status PT Visit Diagnosis: Difficulty in walking, not elsewhere classified (R26.2)     Time: 1755-1820 PT Time Calculation (min) (ACUTE ONLY): 25 min  Charges:  $Gait Training: 8-22 mins $Therapeutic Activity: 8-22 mins                     Luke Falero P.,  PTA Acute Rehabilitation Services Secure Chat Preferred 9a-5:30pm Office: [place] 07/21/2022, 6:59 PM

## 2022-07-21 NOTE — Progress Notes (Signed)
Occupational Therapy Treatment Patient Details Name: Craig Lawson MRN: [id number] DOB: 04/04/1946 Today's Date: 07/21/2022   History of present illness Pt is a 77 year old man presented to ED with progressive angina 07/03/22 admitted with crescendo angina and suspected NSTEMI who underwent uneventful bioprosthetic AVR and two-vessel bypass 07/11/22. [id number] witness (blood product refusal) with CAD, moderate-severe AS by echo 04/2022, chronic edema, mild dilation of ascending aorta, LBBB, HTN, HLD, morbid obesity with OSA intolerant of CPAP, possible prior TIAs, habitual ETOH use   OT comments  Patient progressing well towards goals.  Provided sternal handout for reference, but good recall of precautions.  Completing bed mobility with min assist, transfers with min guard.  Continues to require assist for socks, but discussed compensatory techniques for LB dressing (and pt has sock aide available to him) or will have spouse assist him.  Simulated shower transfers with min guard using RW until he can reach his grabbar and shower chair.  VSS on RA.  Will follow acutely.    Recommendations for follow up therapy are one component of a multi-disciplinary discharge planning process, led by the attending physician.  Recommendations may be updated based on patient status, additional functional criteria and insurance authorization.    Assistance Recommended at Discharge Intermittent Supervision/Assistance  Patient can return home with the following  A little help with walking and/or transfers;A lot of help with bathing/dressing/bathroom;Assistance with cooking/housework;Help with stairs or ramp for entrance;Assist for transportation   Equipment Recommendations  None recommended by OT    Recommendations for Other Services      Precautions / Restrictions Precautions Precautions: Sternal Precaution Booklet Issued: Yes (comment) Precaution Comments: reviewed with pt Restrictions Weight Bearing  Restrictions: Yes Other Position/Activity Restrictions: sternal       Mobility Bed Mobility Overal bed mobility: Needs Assistance Bed Mobility: Supine to Sit     Supine to sit: Min assist     General bed mobility comments: trunk support to elevate    Transfers Overall transfer level: Needs assistance Equipment used: Rolling walker (2 wheels) Transfers: Sit to/from Stand Sit to Stand: Min guard           General transfer comment: cueing for hands on knees with min guard to stay foward while powering up     Balance Overall balance assessment: Mild deficits observed, not formally tested                                         ADL either performed or assessed with clinical judgement   ADL Overall ADL's : Needs assistance/impaired                 Upper Body Dressing : Set up;Sitting   Lower Body Dressing: Minimal assistance;Sit to/from stand Lower Body Dressing Details (indicate cue type and reason): discussed compensatory techniques and pt plans to wear slip on shoes Toilet Transfer: Min guard;Ambulation;Rolling walker (2 wheels)       Tub/ Shower Transfer: Walk-in shower;Min guard;Ambulation;Rolling walker (2 wheels);Shower seat   Functional mobility during ADLs: Min guard;Rolling walker (2 wheels)      Extremity/Trunk Assessment              Vision       Perception     Praxis      Cognition Arousal/Alertness: Awake/alert Behavior During Therapy: WFL for tasks assessed/performed Overall Cognitive Status: Within Functional Limits for tasks assessed  Exercises      Shoulder Instructions       General Comments VSS on RA    Pertinent Vitals/ Pain       Pain Assessment Pain Assessment: No/denies pain Pain Intervention(s): Monitored during session  Home Living                                          Prior Functioning/Environment               Frequency  Min 2X/week        Progress Toward Goals  OT Goals(current goals can now be found in the care plan section)  Progress towards OT goals: Progressing toward goals  Acute Rehab OT Goals Patient Stated Goal: get better OT Goal Formulation: With patient Time For Goal Achievement: 08/01/22 Potential to Achieve Goals: Good  Plan Discharge plan remains appropriate;Frequency remains appropriate    Co-evaluation                 AM-PAC OT "6 Clicks" Daily Activity     Outcome Measure   Help from another person eating meals?: None Help from another person taking care of personal grooming?: A Little Help from another person toileting, which includes using toliet, bedpan, or urinal?: A Little Help from another person bathing (including washing, rinsing, drying)?: A Little Help from another person to put on and taking off regular upper body clothing?: A Little Help from another person to put on and taking off regular lower body clothing?: A Lot 6 Click Score: 18    End of Session Equipment Utilized During Treatment: Rolling walker (2 wheels)  OT Visit Diagnosis: Other abnormalities of gait and mobility (R26.89)   Activity Tolerance Patient tolerated treatment well   Patient Left in chair;with call bell/phone within reach   Nurse Communication Mobility status        Time: [id number] OT Time Calculation (min): 31 min  Charges: OT General Charges $OT Visit: 1 Visit OT Treatments $Self Care/Home Management : 23-37 mins  Jolaine Artist, OT Acute Rehabilitation Services Office [phone]   Craig Lawson 07/21/2022, 12:22 PM

## 2022-07-21 NOTE — Progress Notes (Signed)
CARDIAC REHAB PHASE I   PRE:  Rate/Rhythm: 66 SR   BP:  Sitting: 136/66      SaO2: 95 RA  MODE:  Ambulation: 240 ft   POST:  Rate/Rhythm: 72 SR   BP:  Sitting: 166/72      SaO2: 97 RA   Pt ambulated in hall using front wheel walker. Tolerated well with no pain, SOB or dizziness.  Returned to bed with call bell and bedside table in reach. Encouraged continued ambulation and IS use. Will continue to follow.   [id number]  Vanessa Barbara, RN BSN 07/21/2022 9:05 AM

## 2022-07-21 NOTE — Progress Notes (Signed)
Rounding Note    Patient Name: Craig Lawson Date of Encounter: 07/21/2022  [place] Cardiologist: Shirlee More, MD   Subjective   Pt found sitting on side of bed, NAD, feels like he is still gasping for air at night  Inpatient Medications    Scheduled Meds:  amiodarone  200 mg Oral Daily   aspirin EC  325 mg Oral Daily   Or   aspirin  324 mg Per Tube Daily   bisacodyl  10 mg Oral Daily   Or   bisacodyl  10 mg Rectal Daily   Chlorhexidine Gluconate Cloth  6 each Topical Q0600   docusate sodium  200 mg Oral Daily   ezetimibe  10 mg Oral QHS   ferrous sulfate  325 mg Oral Q breakfast   furosemide  40 mg Intravenous Daily   gabapentin  100 mg Oral BID   insulin aspart  0-15 Units Subcutaneous TID WC   metoprolol tartrate  25 mg Oral BID   pantoprazole  40 mg Oral Daily   potassium chloride  20 mEq Oral Daily   rosuvastatin  20 mg Oral Daily   sodium chloride flush  3 mL Intravenous Q12H   sodium chloride flush  3 mL Intravenous Q12H   Continuous Infusions:  sodium chloride     lactated ringers Stopped (07/16/22 0751)   PRN Meds: sodium chloride, metoprolol tartrate, ondansetron (ZOFRAN) IV, mouth rinse, oxyCODONE, sodium chloride flush, sodium chloride flush, traMADol   Vital Signs    Vitals:   07/20/22 1952 07/20/22 2356 07/21/22 0336 07/21/22 0603  BP: 131/66 119/61  (!) 143/75  Pulse: 66 60 66 65  Resp: 20 20 (!) 27 19  Temp: 98.5 F (36.9 C) 97.7 F (36.5 C)  98.8 F (37.1 C)  TempSrc: Oral Oral  Oral  SpO2: 94% 94% 97% 97%  Weight:   116.8 kg   Height:   5\' 9"  (1.753 m)     Intake/Output Summary (Last 24 hours) at 07/21/2022 0728 Last data filed at 07/21/2022 0336 Gross per 24 hour  Intake --  Output 2325 ml  Net -2325 ml      07/21/2022    3:36 AM 07/19/2022    6:00 AM 07/18/2022    5:00 AM  Last 3 Weights  Weight (lbs) 257 lb 6.4 oz 258 lb 6.1 oz 259 lb 4.2 oz  Weight (kg) 116.756 kg 117.2 kg 117.6 kg      Telemetry    Sinus  rhythm in the 70s - Personally Reviewed  ECG    No new tracings - Personally Reviewed  Physical Exam   GEN: No acute distress.   Neck: minimal JVD Cardiac: RRR, no murmurs, rubs, or gallops.  Respiratory: diminished in bases GI: Soft, nontender, non-distended  MS: mild B LE edema; chronic skin changes Neuro:  Nonfocal  Psych: Normal affect   Labs    High Sensitivity Troponin:   Recent Labs  Lab 07/03/22 1529 07/03/22 1826 07/04/22 0620 07/14/22 1455 07/15/22 0424  TROPONINIHS 49* 114* 86* 1,829* 1,451*     Chemistry Recent Labs  Lab 07/16/22 0142 07/16/22 0506 07/17/22 0426 07/18/22 1121 07/19/22 0120 07/20/22 0114  NA 133* 136   < > 136 134* 133*  K 4.2 4.2   < > 4.0 4.3 4.4  CL 97* 99   < > 94* 99 100  CO2 28 27   < > 30 29 27   GLUCOSE 109* 104*   < > 99  118* 108*  BUN 37* 39*   < > 29* 35* 30*  CREATININE 1.25* 1.27*   < > 1.25* 1.21 1.15  CALCIUM 8.0* 7.9*   < > 8.3* 8.0* 7.9*  MG 2.5* 2.3  --   --   --   --   GFRNONAA 59* 58*   < > 59* >60 >60  ANIONGAP 8 10   < > 12 6 6    < > = values in this interval not displayed.    Lipids No results for input(s): "CHOL", "TRIG", "HDL", "LABVLDL", "LDLCALC", "CHOLHDL" in the last 168 hours.  Hematology Recent Labs  Lab 07/16/22 0142 07/18/22 1121 07/20/22 0114  WBC 7.4 8.6 7.9  RBC 3.07* 3.62* 3.20*  HGB 9.7* 11.2* 9.8*  HCT 28.2* 32.3* 29.0*  MCV 91.9 89.2 90.6  MCH 31.6 30.9 30.6  MCHC 34.4 34.7 33.8  RDW 14.3 13.8 13.9  PLT 146* 231 251   Thyroid No results for input(s): "TSH", "FREET4" in the last 168 hours.  BNPNo results for input(s): "BNP", "PROBNP" in the last 168 hours.  DDimer No results for input(s): "DDIMER" in the last 168 hours.   Radiology    DG CHEST PORT 1 VIEW  Result Date: 07/20/2022 CLINICAL DATA:  Shortness of breath EXAM: PORTABLE CHEST 1 VIEW COMPARISON:  07/18/2022 FINDINGS: Cardiac shadow is enlarged. Postsurgical changes are noted. Lungs are hypoinflated but clear. No bony  abnormality is noted. IMPRESSION: No active disease. Electronically Signed   By: Inez Catalina M.D.   On: 07/20/2022 10:41    Cardiac Studies   Echo 07/14/22: 1. Technically difficult study with very limited visualization of cardiac  structures.  2. On contrasted views, LV function appears normal with an estimated EF of  55%. There is paradoxical motion of the interventricular septum due to  underlying LBBB.  3. There is a well seated bioprosthetic valve in the aortic position with  no signs of paravalvular regurgitation.  4. No pericardial effusion visualized. FINDINGS   Left Ventricle: Left ventricular ejection fraction, by estimation, is 50  to 55%. The left ventricle has low normal function. Abnormal (paradoxical)  septal motion consistent with post-operative status and abnormal  (paradoxical) septal motion, consistent  with left bundle branch block. Left ventricular diastolic parameters are  consistent with Grade II diastolic dysfunction (pseudonormalization). Left  ventricular diastolic function could not be evaluated due to indeterminate  diastolic function.    LHC 07/04/22:   Dist RCA lesion is 50% stenosed.   2nd Diag lesion is 25% stenosed.   1st Diag lesion is 75% stenosed.   RPDA lesion is 30% stenosed.   RPAV lesion is 60% stenosed.   Prox LAD to Mid LAD lesion is 20% stenosed.   Ost Cx to Prox Cx lesion is 90% stenosed.   Mid LM to Ost LAD lesion is 80% stenosed.   Ost LAD lesion is 80% stenosed.   Previously placed 1st Mrg stent of unknown type is  widely patent.   Severe distal left main stenosis (IVUS minimum lumen area of 4.0 mm2) Severe ostial LAD stenosis (IVUD minimum lumen area of 3.5 mm2). Patent mid LAD stent.  Severe ostial Circumflex stenosis. Patent obtuse marginal stent Large dominant RCA with moderate distal stenosis, patent PDA stent, moderate posterolateral artery stenosis.  Severe aortic stenosis by echo (Cath data: mean gradient 32.8 mmHg, peak to  peak gradient 42 mmHg)  Patient Profile     77 y.o. male Jehovah's witness (blood product refusal) with CAD (  DES to PDA and DES to OM2 at [facility] 2019, PTCA of ISR of PDA and attempted unusccessful angioplasty to LAD in 07/2019 with staged intervention with PTCA to D2, PTCA/DES/orbital atherectomy to LAD), moderate-severe AS by echo 04/2022, chronic edema, mild dilation of ascending aorta, LBBB, HTN, HLD (declines statins due to joint pain), morbid obesity with OSA intolerant of CPAP, possible prior TIAs (improved after initiation of Plavix per pt), habitual ETOH use (max ~2 glasses of wine nightly) who is was seen 07/03/2022 for the evaluation of progressive angina. LHC revealed severe distal left main stenosis involving LAD and LCX, severe aortic stenosis. He was recommended for CABG and AVR which occurred on 07/11/22. Post op course has been complicated by pleural effusion requiring thoracentesis 07/19/22 and post op Afib treated with amiodarone.  Assessment & Plan    CAD s/p prior PCI and now CABG x 2 with LIMA-LAD, SVG-OM 07/11/22 - continue ASA, BB, statin - no chest pain   Shortness of breath at night Hx of OSA - not on CPAP - appears mildly volume up - may need repeat sleep study after he recovers from surgery   Severe AS s/p AVR Good valve function on echo Echo in 6 weeks   Pleural effusion Suspected LLL pneumonia S/P thoracentesis On ABX Per primary   Post op atrial fibrillation Converted to NSR on IV amiodarone Not on anticoagulation Now on 200 mg amiodarone Likely plan to wean amiodarone in 3 months   Hyperlipidemia with LDL goal < 70 07/04/2022: Cholesterol 184; HDL 37; LDL Cholesterol 108; Triglycerides 196; VLDL 39 On crestor + 10 mg zetia --> changed from 20 mg pravastatin LP(a) not checked Recheck lipids in 6 weeks May ultimately need lipid clinic       For questions or updates, please contact [place] Please consult [email or web address] for contact info  under        Signed, Ledora Bottcher, PA  07/21/2022, 7:28 AM

## 2022-07-21 NOTE — Progress Notes (Signed)
10 Days Post-Op Procedure(s) (LRB): CORONARY ARTERY BYPASS GRAFTING (CABG) X TWO, USING LEFT INTERNAL MAMMARY AND RIGHT LEG GREATER SAPHENOUS VEIN HARVESTED ENDOSCOPICALLY (N/A) AORTIC VALVE REPLACEMENT (AVR) USING INSPIRIS VALVE SIZE 25MM (N/A) TRANSESOPHAGEAL ECHOCARDIOGRAM (N/A) Subjective: Primarily c/o of SOB at night, some anxiety is playing a role  Objective: Vital signs in last 24 hours: Temp:  [97.7 F (36.5 C)-98.8 F (37.1 C)] 98.8 F (37.1 C) (04/01 0603) Pulse Rate:  [59-66] 65 (04/01 0603) Cardiac Rhythm: Heart block;Bundle branch block (03/31 1924) Resp:  [17-27] 19 (04/01 0603) BP: (112-143)/(58-75) 143/75 (04/01 0603) SpO2:  [94 %-98 %] 97 % (04/01 0603) Weight:  [116.8 kg] 116.8 kg (04/01 0336)  Hemodynamic parameters for last 24 hours:    Intake/Output from previous day: 03/31 0701 - 04/01 0700 In: -  Out: 2325 [Urine:2325] Intake/Output this shift: No intake/output data recorded.  General appearance: alert, cooperative, and no distress Heart: regular rate and rhythm Lungs: slightly dim in left base Abdomen: benign Extremities: minimal edema Wound: incis healing well  Lab Results: Recent Labs    07/18/22 1121 07/20/22 0114  WBC 8.6 7.9  HGB 11.2* 9.8*  HCT 32.3* 29.0*  PLT 231 251   BMET:  Recent Labs    07/19/22 0120 07/20/22 0114  NA 134* 133*  K 4.3 4.4  CL 99 100  CO2 29 27  GLUCOSE 118* 108*  BUN 35* 30*  CREATININE 1.21 1.15  CALCIUM 8.0* 7.9*    PT/INR: No results for input(s): "LABPROT", "INR" in the last 72 hours. ABG    Component Value Date/Time   PHART 7.338 (L) 07/11/2022 2214   HCO3 23.0 07/11/2022 2214   TCO2 24 07/11/2022 2214   ACIDBASEDEF 3.0 (H) 07/11/2022 2214   O2SAT 96 07/11/2022 2214   CBG (last 3)  Recent Labs    07/20/22 1702 07/20/22 2107 07/21/22 0605  GLUCAP 112* 112* 104*    Meds Scheduled Meds:  amiodarone  200 mg Oral Daily   aspirin EC  325 mg Oral Daily   Or   aspirin  324 mg Per  Tube Daily   bisacodyl  10 mg Oral Daily   Or   bisacodyl  10 mg Rectal Daily   Chlorhexidine Gluconate Cloth  6 each Topical Q0600   docusate sodium  200 mg Oral Daily   ezetimibe  10 mg Oral QHS   ferrous sulfate  325 mg Oral Q breakfast   furosemide  40 mg Intravenous Daily   gabapentin  100 mg Oral BID   insulin aspart  0-15 Units Subcutaneous TID WC   metoprolol tartrate  25 mg Oral BID   pantoprazole  40 mg Oral Daily   potassium chloride  20 mEq Oral Daily   rosuvastatin  20 mg Oral Daily   sodium chloride flush  3 mL Intravenous Q12H   sodium chloride flush  3 mL Intravenous Q12H   Continuous Infusions:  sodium chloride     lactated ringers Stopped (07/16/22 0751)   PRN Meds:.sodium chloride, metoprolol tartrate, ondansetron (ZOFRAN) IV, mouth rinse, oxyCODONE, sodium chloride flush, sodium chloride flush, traMADol  Xrays DG CHEST PORT 1 VIEW  Result Date: 07/20/2022 CLINICAL DATA:  Shortness of breath EXAM: PORTABLE CHEST 1 VIEW COMPARISON:  07/18/2022 FINDINGS: Cardiac shadow is enlarged. Postsurgical changes are noted. Lungs are hypoinflated but clear. No bony abnormality is noted. IMPRESSION: No active disease. Electronically Signed   By: Inez Catalina M.D.   On: 07/20/2022 10:41    Assessment/Plan: S/P  Procedure(s) (LRB): CORONARY ARTERY BYPASS GRAFTING (CABG) X TWO, USING LEFT INTERNAL MAMMARY AND RIGHT LEG GREATER SAPHENOUS VEIN HARVESTED ENDOSCOPICALLY (N/A) AORTIC VALVE REPLACEMENT (AVR) USING INSPIRIS VALVE SIZE 25MM (N/A) TRANSESOPHAGEAL ECHOCARDIOGRAM (N/A) POD#11  1 afeb, VSS 2 O2 sats good on RA-2 liters, walked ok on RA yesterday- primarily feels SOB at night, has h/o sleep apnea but intolerant of CPAP- will test night time sats and see if he qualifies for home O2 till long term solution available(Poss other Tx for OSA. - repeat study) 3 good UOP 4 no new labs/CXR, Xray yesterday fairly unremarkable 5 cont rehab and pulm hygiene      LOS: 18 days     John Giovanni PA-C Pager [phone] 07/21/2022

## 2022-07-21 NOTE — Telephone Encounter (Signed)
OP Sleep medicine appointment requested.  Julian Hy, DO 07/21/22 8:08 AM [facility]  For contact information, see Amion. If no response to pager, please call PCCM consult pager. After hours, 7PM- 7AM, please call Elink.

## 2022-07-22 ENCOUNTER — Inpatient Hospital Stay (HOSPITAL_COMMUNITY): Payer: Medicare Other

## 2022-07-22 LAB — GLUCOSE, CAPILLARY: Glucose-Capillary: 98 mg/dL (ref 70–99)

## 2022-07-22 MED ORDER — METOPROLOL TARTRATE 25 MG PO TABS: 25.0000 mg | ORAL_TABLET | Freq: Two times a day (BID) | ORAL | 1 refills | Status: DC

## 2022-07-22 MED ORDER — CLOPIDOGREL BISULFATE 75 MG PO TABS
75.0000 mg | ORAL_TABLET | Freq: Every day | ORAL | Status: DC
  Administered 2022-07-22: 75 mg via ORAL
  Filled 2022-07-22: qty 1

## 2022-07-22 MED ORDER — ASPIRIN 81 MG PO CHEW: 324.0000 mg | CHEWABLE_TABLET | Freq: Every day | ORAL | Status: DC

## 2022-07-22 MED ORDER — ASPIRIN 81 MG PO TBEC
81.0000 mg | DELAYED_RELEASE_TABLET | Freq: Every day | ORAL | Status: DC
  Administered 2022-07-22: 81 mg via ORAL
  Filled 2022-07-22: qty 1

## 2022-07-22 MED ORDER — AMIODARONE HCL 200 MG PO TABS: 200.0000 mg | ORAL_TABLET | Freq: Every day | ORAL | 1 refills | Status: DC

## 2022-07-22 MED ORDER — FUROSEMIDE 40 MG PO TABS: 40.0000 mg | ORAL_TABLET | Freq: Every day | ORAL | 0 refills | Status: DC

## 2022-07-22 MED ORDER — POTASSIUM CHLORIDE CRYS ER 20 MEQ PO TBCR: 20.0000 meq | EXTENDED_RELEASE_TABLET | Freq: Every day | ORAL | 0 refills | Status: DC

## 2022-07-22 MED ORDER — FERROUS SULFATE 325 (65 FE) MG PO TABS: 325.0000 mg | ORAL_TABLET | Freq: Every day | ORAL | 3 refills | Status: DC

## 2022-07-22 MED ORDER — GABAPENTIN 100 MG PO CAPS: 100.0000 mg | ORAL_CAPSULE | Freq: Two times a day (BID) | ORAL | 1 refills | Status: DC

## 2022-07-22 MED ORDER — ROSUVASTATIN CALCIUM 20 MG PO TABS: 20.0000 mg | ORAL_TABLET | Freq: Every day | ORAL | 1 refills | Status: AC

## 2022-07-22 MED ORDER — OXYCODONE HCL 5 MG PO TABS: 5.0000 mg | ORAL_TABLET | Freq: Four times a day (QID) | ORAL | 0 refills | Status: AC | PRN

## 2022-07-22 NOTE — Progress Notes (Signed)
CARDIAC REHAB PHASE I   PRE:  Rate/Rhythm: 67  BP:  Sitting: 136/79      SaO2: 98  MODE:  Ambulation: 470 ft   POST:  Rate/Rhythm: 75 SR     SaO2: 97 RA    Pt ambulated in hall with front wheel walker, moving at slow steady pace. Returned to chair with call bell and personal items in reach. Post OHS education including site care, restrictions, risk factors, heart healthy diet, sternal precautions, IS use at home, exercise guidelines and CRP2 reviewed. All questions and concerns addressed. Will refer to [facility] for CRP2. Plan for home today.    [id number] Vanessa Barbara, RN BSN 07/22/2022 10:03 AM

## 2022-07-22 NOTE — TOC Transition Note (Signed)
Transition of Care (TOC) - CM/SW Discharge Note Marvetta Gibbons RN, BSN Transitions of Care Unit 4E- RN Case Manager See Treatment Team for direct phone #   Patient Details  Name: Craig Lawson MRN: [id number] Date of Birth: 09-13-1945  Transition of Care [facility]) CM/SW Contact:  Dawayne Patricia, RN Phone Number: 07/22/2022, 12:13 PM   Clinical Narrative:    Pt stable for transition home today, over night oximetry completed and home 02 order placed for night time 02.   CM spoke with pt at bedside, pt agreeable to home 02 and states he is hopeful it is short term. Pt voiced he has sleep study arranged for April 10.  Pt has no preference for home 02 provider- and asked if 02 could be delivered to home.  Pt also states he has RW already at home.   Address, phone # and PCP all confirmed in epic.   Call made to [facility] for home 02 needs- Liaison to f/u and have home 02 delivered to home this afternoon.   Pt ride coming within the hour to transport home   Final next level of care: Home/Self Care Barriers to Discharge: Barriers Resolved   Patient Goals and CMS Choice CMS Medicare.gov Compare Post Acute Care list provided to:: Patient Choice offered to / list presented to : Patient  Discharge Placement                 Home        Discharge Plan and Services Additional resources added to the After Visit Summary for   In-house Referral: NA Discharge Planning Services: CM Consult Post Acute Care Choice: Durable Medical Equipment          DME Arranged: Oxygen DME Agency: [place] Date DME Agency Contacted: 07/22/22 Time DME Agency Contacted: 1000 Representative spoke with at DME Agency: [facility] HH Arranged: NA [place] Agency: NA        Social Determinants of Health ([place]) Interventions SDOH Screenings   Food Insecurity: No Food Insecurity (07/04/2022)  Housing: Low Risk  (07/04/2022)  Transportation Needs: No Transportation Needs (07/04/2022)  Utilities: Not At  Risk (07/04/2022)  Tobacco Use: Medium Risk (07/12/2022)     Readmission Risk Interventions     No data to display

## 2022-07-22 NOTE — Progress Notes (Signed)
[address]       [place] [address]             [phone]     11 Days Post-Op Procedure(s) (LRB): CORONARY ARTERY BYPASS GRAFTING (CABG) X TWO, USING LEFT INTERNAL MAMMARY AND RIGHT LEG GREATER SAPHENOUS VEIN HARVESTED ENDOSCOPICALLY (N/A) AORTIC VALVE REPLACEMENT (AVR) USING INSPIRIS VALVE SIZE 25MM (N/A) TRANSESOPHAGEAL ECHOCARDIOGRAM (N/A) Subjective: Feels pretty well, minor sputum production , does desat at night so will need home O2 until he can see sleep medicine for apnea study/CPAP  Objective: Vital signs in last 24 hours: Temp:  [97.6 F (36.4 C)-98.5 F (36.9 C)] 97.7 F (36.5 C) (04/02 0602) Pulse Rate:  [56-67] 62 (04/02 0602) Cardiac Rhythm: Heart block;Bundle branch block (04/01 1901) Resp:  [19-20] 20 (04/02 0602) BP: (126-143)/(62-74) 133/66 (04/02 0602) SpO2:  [92 %-97 %] 93 % (04/02 0602) Weight:  [115.9 kg] 115.9 kg (04/02 0602)  Hemodynamic parameters for last 24 hours:    Intake/Output from previous day: 04/01 0701 - 04/02 0700 In: 240 [P.O.:240] Out: 1800 [Urine:1800] Intake/Output this shift: No intake/output data recorded.  General appearance: alert, cooperative, and no distress Heart: regular rate and rhythm and soft systolic murmur Lungs: clear Abdomen: benign Extremities: minor edema Wound: incis healing well  Lab Results: Recent Labs    07/20/22 0114  WBC 7.9  HGB 9.8*  HCT 29.0*  PLT 251   BMET:  Recent Labs    07/20/22 0114  NA 133*  K 4.4  CL 100  CO2 27  GLUCOSE 108*  BUN 30*  CREATININE 1.15  CALCIUM 7.9*    PT/INR: No results for input(s): "LABPROT", "INR" in the last 72 hours. ABG    Component Value Date/Time   PHART 7.338 (L) 07/11/2022 2214   HCO3 23.0 07/11/2022 2214   TCO2 24 07/11/2022 2214   ACIDBASEDEF 3.0 (H) 07/11/2022 2214   O2SAT 96 07/11/2022 2214   CBG (last 3)  Recent Labs    07/21/22 1739 07/21/22 2053 07/22/22 0541  GLUCAP 105* 126* 98    Meds Scheduled  Meds:  amiodarone  200 mg Oral Daily   aspirin EC  325 mg Oral Daily   Or   aspirin  324 mg Per Tube Daily   bisacodyl  10 mg Oral Daily   Or   bisacodyl  10 mg Rectal Daily   Chlorhexidine Gluconate Cloth  6 each Topical Q0600   docusate sodium  200 mg Oral Daily   ezetimibe  10 mg Oral QHS   ferrous sulfate  325 mg Oral Q breakfast   furosemide  40 mg Oral Daily   gabapentin  100 mg Oral BID   insulin aspart  0-15 Units Subcutaneous TID WC   metoprolol tartrate  25 mg Oral BID   pantoprazole  40 mg Oral Daily   potassium chloride  20 mEq Oral Daily   rosuvastatin  20 mg Oral Daily   sodium chloride flush  3 mL Intravenous Q12H   sodium chloride flush  3 mL Intravenous Q12H   Continuous Infusions:  sodium chloride     lactated ringers Stopped (07/16/22 0751)   PRN Meds:.sodium chloride, metoprolol tartrate, ondansetron (ZOFRAN) IV, mouth rinse, oxyCODONE, sodium chloride flush, sodium chloride flush, traMADol  Xrays DG CHEST PORT 1 VIEW  Result Date: 07/20/2022 CLINICAL DATA:  Shortness of breath EXAM: PORTABLE CHEST 1 VIEW COMPARISON:  07/18/2022 FINDINGS: Cardiac shadow is enlarged. Postsurgical changes are noted. Lungs are  hypoinflated but clear. No bony abnormality is noted. IMPRESSION: No active disease. Electronically Signed   By: Inez Catalina M.D.   On: 07/20/2022 10:41    Assessment/Plan: S/P Procedure(s) (LRB): CORONARY ARTERY BYPASS GRAFTING (CABG) X TWO, USING LEFT INTERNAL MAMMARY AND RIGHT LEG GREATER SAPHENOUS VEIN HARVESTED ENDOSCOPICALLY (N/A) AORTIC VALVE REPLACEMENT (AVR) USING INSPIRIS VALVE SIZE 25MM (N/A) TRANSESOPHAGEAL ECHOCARDIOGRAM (N/A) POD#12  1 afeb, VSS 2 O2 sats appears to desat frequently at night- arranging home O2 3 good UOP- weight trending lower 4 BS well controlled 5 no new labs  6 CXR- improved aeration 7 start plavix, change to 81 mg ASA 8 appears stable for D/C   LOS: 19 days    John Giovanni PA-C Pager [phone]  07/22/2022

## 2022-07-22 NOTE — Telephone Encounter (Signed)
Scheduled for sleep consult on 4/10. Nothing further needed.

## 2022-07-23 MED FILL — Mannitol IV Soln 20%: INTRAVENOUS | Qty: 250 | Status: AC

## 2022-07-23 MED FILL — Sodium Chloride IV Soln 0.9%: INTRAVENOUS | Qty: 2000 | Status: AC

## 2022-07-23 MED FILL — Electrolyte-R (PH 7.4) Solution: INTRAVENOUS | Qty: 4000 | Status: AC

## 2022-07-23 MED FILL — Heparin Sodium (Porcine) Inj 1000 Unit/ML: INTRAMUSCULAR | Qty: 30 | Status: AC

## 2022-07-23 MED FILL — Sodium Bicarbonate IV Soln 8.4%: INTRAVENOUS | Qty: 50 | Status: AC

## 2022-07-25 ENCOUNTER — Other Ambulatory Visit: Payer: Self-pay | Admitting: Thoracic Surgery (Cardiothoracic Vascular Surgery)

## 2022-07-25 DIAGNOSIS — Z952 Presence of prosthetic heart valve: Secondary | ICD-10-CM

## 2022-07-25 NOTE — Progress Notes (Unsigned)
     [address]       [place] [address]             [phone]   HPI: This is a 77 year old male with a past medical history of coronary artery disease, OSA, TIA, hypertension, hypercholesterolemia, obesity, remote tobacco abuse, as well as a multitude of other medical problems who underwent an AVR  (size 25 mm) and CABG x 2 (LIMA to LAD and RSVG from aorta to OM)  by Dr. Leafy Ro on 07/11/2022.   Current Outpatient Medications  Medication Sig Dispense Refill   amiodarone (PACERONE) 200 MG tablet Take 1 tablet (200 mg total) by mouth daily. 30 tablet 1   aspirin EC 81 MG tablet Take 81 mg by mouth at bedtime.     Cholecalciferol 125 MCG (5000 UT) TABS Take 1 tablet by mouth daily.     clopidogrel (PLAVIX) 75 MG tablet TAKE 1 TABLET BY MOUTH AT  BEDTIME 100 tablet 2   ezetimibe (ZETIA) 10 MG tablet Take 10 mg by mouth at bedtime.     ferrous sulfate 325 (65 FE) MG tablet Take 1 tablet (325 mg total) by mouth daily with breakfast. 30 tablet 3   furosemide (LASIX) 40 MG tablet Take 1 tablet (40 mg total) by mouth daily. 5 tablet 0   gabapentin (NEURONTIN) 100 MG capsule Take 1 capsule (100 mg total) by mouth 2 (two) times daily. 60 capsule 1   metoprolol tartrate (LOPRESSOR) 25 MG tablet Take 1 tablet (25 mg total) by mouth 2 (two) times daily. 60 tablet 1   oxyCODONE (OXY IR/ROXICODONE) 5 MG immediate release tablet Take 1 tablet (5 mg total) by mouth every 6 (six) hours as needed for up to 7 days for severe pain. 28 tablet 0   potassium chloride SA (KLOR-CON M) 20 MEQ tablet Take 1 tablet (20 mEq total) by mouth daily. 5 tablet 0   rosuvastatin (CRESTOR) 20 MG tablet Take 1 tablet (20 mg total) by mouth daily. 30 tablet 1  Vital Signs:   Physical Exam: CV Pulmonary Abdomen Extremities Wounds  Diagnostic Tests: ***  Impression and Plan: Mr. Blend is continuing to recover from aortic valve replacement and CABG x 2. He was instructed to continue with sternal  precautions (no lifting more than 10 pounds for at least 5 more weeks. We will discuss driving and participation in  cardiac rehab at next office visit.  Patient will see cardiology at the end of April and have a echo in May.    Ardelle Balls, PA-C [facility] [phone]

## 2022-07-28 ENCOUNTER — Encounter: Payer: Self-pay | Admitting: Physician Assistant

## 2022-07-28 ENCOUNTER — Ambulatory Visit (INDEPENDENT_AMBULATORY_CARE_PROVIDER_SITE_OTHER): Payer: Self-pay | Admitting: Physician Assistant

## 2022-07-28 ENCOUNTER — Ambulatory Visit
Admission: RE | Admit: 2022-07-28 | Discharge: 2022-07-28 | Disposition: A | Discharge status: Home / Self Care | Payer: Medicare Other | Source: Ambulatory Visit | Attending: Thoracic Surgery (Cardiothoracic Vascular Surgery) | Admitting: Thoracic Surgery (Cardiothoracic Vascular Surgery)

## 2022-07-28 VITALS — BP 118/65 | HR 56 | Resp 20 | Ht 69.0 in | Wt 257.0 lb

## 2022-07-28 DIAGNOSIS — Z952 Presence of prosthetic heart valve: Secondary | ICD-10-CM

## 2022-07-28 MED ORDER — FUROSEMIDE 40 MG PO TABS: 40.0000 mg | ORAL_TABLET | Freq: Every day | ORAL | 0 refills | Status: DC

## 2022-07-28 MED ORDER — POTASSIUM CHLORIDE CRYS ER 20 MEQ PO TBCR: 20.0000 meq | EXTENDED_RELEASE_TABLET | Freq: Every day | ORAL | 0 refills | Status: DC

## 2022-07-28 NOTE — Patient Instructions (Signed)
He was instructed to continue with sternal precautions (no lifting more than 10 pounds for at least 5 more weeks.  2. He has an appointment for sleep study (OSA) on 04/10  and per discharge instructions from hospital, he will stay on oxygen until this has been completed and interpreted.  3. We will discuss driving and participation in cardiac rehab at next office visit. 4. Lasix/potassium supplement for one week

## 2022-07-30 ENCOUNTER — Ambulatory Visit: Payer: Medicare Other | Admitting: Nurse Practitioner

## 2022-07-30 ENCOUNTER — Encounter: Payer: Self-pay | Admitting: Nurse Practitioner

## 2022-07-30 VITALS — BP 136/70 | HR 59 | Ht 69.5 in | Wt 256.8 lb

## 2022-07-30 DIAGNOSIS — G4733 Obstructive sleep apnea (adult) (pediatric): Secondary | ICD-10-CM | POA: Diagnosis not present

## 2022-07-30 DIAGNOSIS — R0681 Apnea, not elsewhere classified: Secondary | ICD-10-CM

## 2022-07-30 DIAGNOSIS — J9601 Acute respiratory failure with hypoxia: Secondary | ICD-10-CM | POA: Diagnosis not present

## 2022-07-30 DIAGNOSIS — J90 Pleural effusion, not elsewhere classified: Secondary | ICD-10-CM | POA: Diagnosis not present

## 2022-07-30 DIAGNOSIS — G4734 Idiopathic sleep related nonobstructive alveolar hypoventilation: Secondary | ICD-10-CM

## 2022-07-30 DIAGNOSIS — R0683 Snoring: Secondary | ICD-10-CM

## 2022-07-30 DIAGNOSIS — J189 Pneumonia, unspecified organism: Secondary | ICD-10-CM | POA: Diagnosis not present

## 2022-07-30 NOTE — Progress Notes (Signed)
@Patient  ID: Craig Lawson, male    DOB: [birth year], 77 y.o.   MRN: [id number]  Chief Complaint  Patient presents with   Follow-up    Referring provider: Montez Hageman, DO  HPI: 77 year old male, smoker followed for pneumonia, acute respiratory failure and sleep apnea not on CPAP.  New to the pulmonary clinic and last seen by PCCM group during his recent hospitalization. Past medical history significant for CAD status post CABG x 2, NSTEMI, AMS, hypertension, history of TIA, history of OSA not on CPAP, obesity, HLD.   TEST/EVENTS:  07/05/2022 CT chest wo con: diffused CAD. Scattered mediastinal lymph nodes. No acute process in the lungs.  07/14/2022 echo limited: EF 50-55%, GIIDD. RV size nl, function low normal.  07/31/2022: Today-sleep consult Patient presents today for sleep consult with his wife.  He was referred by inpatient PCCM provider, Dr. Chestine Spore.  He had been admitted from 07/03/2022 to 07/22/2022 following NSTEMI.  He was found to have moderate to severe aortic stenosis as well as two-vessel coronary disease.  He was deemed high risk so was kept in the hospital for urgent revascularization.  He underwent uneventful bioprosthetic AVR and CABG x 2 on 3/22.  Following his procedure, he developed increased dyspnea and diaphoresis.  Imaging showed a left basilar infiltrate versus atelectasis. He was started on empiric cefepime course for HAP and continued diuresis. He also had an enlarging pleural effusion so underwent thoracentesis on 3/29. His oxygen status improved but he was still requiring nocturnal supplemental O2 due to desaturations.  He does have a history of OSA but has not been on CPAP in the outpatient setting.  He was started on this inpatient and advised to have outpatient sleep study.  He was discharged by CT surgery on 4/2 following clinical and radiographic improvement with supplemental oxygen for nocturnal use. Today, he tells me that he has been slowly recovering since he  was admitted.  He was there for almost 20 days and so was still feeling weak and a bit fatigued.  He does have plans to start cardiac rehab soon.  From a breathing standpoint, he feels stable but still notices that he gets short winded at times, especially with long distances or uphill climbing.  He has to take frequent breaks. Denies any cough or chest congestion.  No fevers, chills, hemoptysis.  He had repeat chest x-ray on 4/8, which showed some improvement in the left basilar opacity and unchanged small bilateral pleural effusions. He also had oozing of his RLE incision; evaluated by TCTS without evidence of infection. He tells me this is still oozing some bloody discharge but seems to be some better. He has follow up with them in one week and cardiology at the end of May. Let swelling is stable.   He has a previous history of sleep apnea but he does not remember exactly when this was diagnosed.  He had trouble tolerating the CPAP so he stopped using it many years ago.  He does have snoring and witnessed apneas according to his wife who is with him today.  He recently has felt more tired but before this did not notice that he had any excessive daytime fatigue.  His wife does tell me that he dozes frequently while watching TV.  He denies any morning headaches, dry mouth, drowsy driving, sleep parasomnia/paralysis.  No history of narcolepsy or symptoms of cataplexy. Wearing 2 lpm supplemental O2 at night. He usually gets about 8 hours of sleep at night.  Falls asleep quickly.  Stays asleep during the night.  No issues with frequent night awakenings.  Does not take anything to help him sleep at night.   He is a former smoker, quit in 1973.  He drinks 1 to 2 glasses of wine in the evenings.  Drinks 2 cups of coffee in the morning.  He is retired.  Epworth 7  Allergies  Allergen Reactions   Penicillins     Had rash in his 30s-40s with this as well as recurrence when re-trialed   Statins Other (See  Comments)    Muscle pains; increasing with continued use.   Muscle pains; increasing with continued use.    Isosorbide Other (See Comments)    "makes me dizzy and I have headaches"   Latex Rash    Has noticed with bandages in past, leaves a sore on skin but more recently with APAP mask     Immunization History  Administered Date(s) Administered   PFIZER Comirnaty(Gray Top)Covid-19 Tri-Sucrose Vaccine 06/20/2019    Past Medical History:  Diagnosis Date   Aortic stenosis 08/10/2015   Bilateral leg edema 08/22/2015   Bradycardia 06/01/2017   Cellulitis of left lower extremity 04/29/2017   Coronary artery disease involving native coronary artery 10/22/2017   DOE (dyspnea on exertion) 05/04/2017   Essential hypertension 08/13/2015   Heart murmur 04/29/2017   Kissing, osteophytes 08/22/2015   LBBB (left bundle branch block) 08/22/2015   Lipid disorder 11/30/2017   Lumbar facet joint pain 08/22/2015   Medicare annual wellness visit, subsequent 06/02/2016   Morbid obesity 10/22/2017   Murmur 04/29/2017   Myopathy 09/02/2019   Formatting of this note might be different from the original. Secondary to high freq statin use.   Need for vaccination for Strep pneumoniae 03/27/2017   Non-ST elevation myocardial infarction (NSTEMI), subendocardial infarction, subsequent episode of care 11/30/2017   Obesity, Class II, BMI 35-39.9 08/10/2015   OSA on CPAP 12/02/2017   Pure hypercholesterolemia 05/24/2015   Seborrheic keratoses 03/27/2017   Overview:  Of the right forehead.   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 30 tablet 1   aspirin EC 81 MG tablet Take 81 mg by mouth at bedtime.     Cholecalciferol 125 MCG (5000 UT) TABS Take 1 tablet by mouth daily.     clopidogrel (PLAVIX) 75 MG tablet TAKE 1 TABLET BY MOUTH AT  BEDTIME 100 tablet 2   ezetimibe (ZETIA) 10 MG tablet Take 10 mg by mouth at bedtime.     ferrous sulfate 325 (65 FE) MG tablet Take 1 tablet (325 mg total) by mouth daily with breakfast. 30 tablet 3   furosemide (LASIX) 40 MG tablet Take 1 tablet (40 mg total) by mouth daily. 7 tablet 0   gabapentin (NEURONTIN) 100 MG capsule Take 1 capsule (100 mg total) by mouth 2 (two) times daily. 60 capsule 1   metoprolol tartrate (LOPRESSOR) 25 MG tablet Take 1 tablet (25 mg total)  by mouth 2 (two) times daily. 60 tablet 1   potassium chloride SA (KLOR-CON M) 20 MEQ tablet Take 1 tablet (20 mEq total) by mouth daily. 7 tablet 0   rosuvastatin (CRESTOR) 20 MG tablet Take 1 tablet (20 mg total) by mouth daily. 30 tablet 1   No facility-administered medications prior to visit.     Review of Systems:   Constitutional: No weight loss or gain, night sweats, fevers, chills. +fatigue, lassitude (improving) HEENT: No headaches, difficulty swallowing, tooth/dental problems, or sore throat. No sneezing, itching, ear ache, nasal congestion, or post nasal drip CV:  +orthopnea, swelling in lower extremities. No chest pain, PND, anasarca, dizziness, palpitations, syncope Resp: +shortness of breath with exertion (improving); snoring; witnessed apneas. No excess mucus or change in color of mucus. No productive or non-productive. No hemoptysis. No wheezing.  No chest wall deformity GI:  No heartburn, indigestion, abdominal pain, nausea, vomiting, diarrhea, change in bowel habits, loss of appetite, bloody stools.  GU: No dysuria, change in color of  urine, urgency or frequency.   Skin: No rash, lesions, ulcerations. +oozing RLE incision MSK:  No joint pain or swelling.   Neuro: No dizziness or lightheadedness.  Psych: No depression or anxiety. Mood stable.     Physical Exam:  BP 136/70 (BP Location: Right Arm, Cuff Size: Normal)   Pulse (!) 59   Ht 5' 9.5" (1.765 m)   Wt 256 lb 12.8 oz (116.5 kg)   SpO2 95%   BMI 37.38 kg/m   GEN: Pleasant, interactive, well-kempt; obese; in no acute distress. HEENT:  Normocephalic and atraumatic. PERRLA. Sclera white. Nasal turbinates pink, moist and patent bilaterally. No rhinorrhea present. Oropharynx pink and moist, without exudate or edema. No lesions, ulcerations, or postnasal drip. Mallampati III NECK:  Supple w/ fair ROM. No JVD present. Normal carotid impulses w/o bruits. Thyroid symmetrical with no goiter or nodules palpated. No lymphadenopathy.   CV: RRR, no m/r/g, no peripheral edema. Pulses intact, +2 bilaterally. No cyanosis, pallor or clubbing. PULMONARY:  Unlabored, regular breathing. Diminished bases, otherwise clear bilaterally A&P w/o wheezes/rales/rhonchi. No accessory muscle use.  GI: BS present and normoactive. Soft, non-tender to palpation. No organomegaly or masses detected.  MSK: No erythema, warmth or tenderness. Cap refil <2 sec all extrem. No deformities or joint swelling noted.  Neuro: A/Ox3. No focal deficits noted.   Skin: Warm, no lesions or rashes Psych: Normal affect and behavior. Judgement and thought content appropriate.     Lab Results:  CBC    Component Value Date/Time   WBC 7.9 07/20/2022 0114   RBC 3.20 (L) 07/20/2022 0114   HGB 9.8 (L) 07/20/2022 0114   HGB 17.3 07/25/2019 1519   HCT 29.0 (L) 07/20/2022 0114   HCT 48.8 07/25/2019 1519   PLT 251 07/20/2022 0114   PLT 166 07/25/2019 1519   MCV 90.6 07/20/2022 0114   MCV 90 07/25/2019 1519   MCH 30.6 07/20/2022 0114   MCHC 33.8 07/20/2022 0114   RDW 13.9 07/20/2022 0114   RDW 14.0 07/25/2019  1519    BMET    Component Value Date/Time   NA 133 (L) 07/20/2022 0114   NA 141 11/28/2019 1401   K 4.4 07/20/2022 0114   CL 100 07/20/2022 0114   CO2 27 07/20/2022 0114   GLUCOSE 108 (H) 07/20/2022 0114   BUN 30 (H) 07/20/2022 0114   BUN 12 11/28/2019 1401   CREATININE 1.15 07/20/2022 0114   CALCIUM 7.9 (L) 07/20/2022 0114   GFRNONAA >  60 07/20/2022 0114   GFRAA 99 11/28/2019 1401    BNP    Component Value Date/Time   BNP 235.1 (H) 07/13/2022 1745     Imaging:  DG Chest 2 View  Result Date: 07/28/2022 CLINICAL DATA:  Status post AVR EXAM: CHEST - 2 VIEW COMPARISON:  Multiple chest x-rays, most recently July 22, 2022 FINDINGS: The cardiomediastinal silhouette is unchanged in contour status post median sternotomy and aortic valve replacement. Persistent left basilar hazy pulmonary opacity. Small bilateral pleural effusions, unchanged. No pneumothorax. The visualized upper abdomen is unremarkable. No acute osseous abnormality. IMPRESSION: 1. Persistent left basilar hazy pulmonary opacity, likely atelectasis. However, superimposed infection is not excluded. 2. Unchanged small bilateral pleural effusions. Electronically Signed   By: Jacob Moores M.D.   On: 07/28/2022 12:20   DG Chest 2 View  Result Date: 07/22/2022 CLINICAL DATA:  S/p AVR EXAM: CHEST - 2 VIEW COMPARISON:  CXR 07/20/22 FINDINGS: Small bilateral pleural effusions. Hazy opacity at the left lung base has slightly increased from prior exam. Status post median sternotomy. Cardiac and mediastinal contours are unchanged. No radiographically apparent displaced rib fractures. Visualized upper abdomen is unremarkable. There are prominent bilateral interstitial opacities which could represent pulmonary venous congestion or atypical infection. IMPRESSION: 1. Small bilateral pleural effusions. 2. Hazy opacity at the left lung base has slightly increased from prior exam. This could represent atelectasis or infection. Electronically  Signed   By: Lorenza Cambridge M.D.   On: 07/22/2022 08:35   DG CHEST PORT 1 VIEW  Result Date: 07/20/2022 CLINICAL DATA:  Shortness of breath EXAM: PORTABLE CHEST 1 VIEW COMPARISON:  07/18/2022 FINDINGS: Cardiac shadow is enlarged. Postsurgical changes are noted. Lungs are hypoinflated but clear. No bony abnormality is noted. IMPRESSION: No active disease. Electronically Signed   By: Alcide Clever M.D.   On: 07/20/2022 10:41   ECHO INTRAOPERATIVE TEE  Result Date: 07/20/2022  *INTRAOPERATIVE TRANSESOPHAGEAL REPORT *  Patient Name:   NAIN RUDD Date of Exam: 07/11/2022 Medical Rec #:  [id number]     Height:       69.0 in Accession #:    [id number]    Weight:       249.6 lb Date of Birth:  [birth year]      BSA:          2.27 m Patient Age:    77 years      BP:           134/69 mmHg Patient Gender: M             HR:           59 bpm. Exam Location:  Inpatient Transesophogeal exam was perform intraoperatively during surgical procedure. Patient was closely monitored under general anesthesia during the entirety of examination. Indications:     I25.10 CAD Performing Phys: [id number] Eugenio Hoes Complications: No known complications during this procedure. POST-OP IMPRESSIONS _ Left Ventricle: has mildly reduced systolic function, with an ejection fraction of 50%. The wall motion is septal dyssynchrony from pacing, inferior wall mildly hypokinetic relative to anterior wall. _ Right Ventricle: normal function. The cavity was normal. The wall motion is normal. _ Aorta: there is no dissection present in the aorta. 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 The gradient recorded across the prosthetic valve is within the expected range, measuring 4 mmHg. _ Mitral Valve: The mitral valve appears unchanged from pre-bypass. _ Tricuspid Valve: Mild regurgitation post  repair. PRE-OP FINDINGS  Left Ventricle: The left ventricle has low normal systolic function, with an  ejection fraction of 50-55%. The cavity size was normal. No evidence of left ventricular regional wall motion abnormalities. There is moderate concentric left ventricular hypertrophy. Right Ventricle: The right ventricle has normal systolic function. The cavity was normal. There is no increase in right ventricular wall thickness. Left Atrium: Left atrial size was not assessed. No left atrial/left atrial appendage thrombus was detected. Left atrial appendage velocity is normal at greater than 40 cm/s. Right Atrium: Right atrial size was not assessed. Interatrial Septum: No atrial level shunt detected by color flow Doppler. There is no evidence of a patent foramen ovale. Pericardium: There is no evidence of pericardial effusion. Mitral Valve: The mitral valve is normal in structure. Mitral valve regurgitation is mild by color flow Doppler. The MR jet is centrally-directed. There is No evidence of mitral stenosis. Tricuspid Valve: The tricuspid valve was normal in structure. Tricuspid valve regurgitation was not visualized by color flow Doppler. No evidence of tricuspid stenosis is present. Aortic Valve: The aortic valve is tricuspid Aortic valve regurgitation is mild by color flow Doppler. The jet is posteriorly-directed. There is moderate stenosis of the aortic valve, with a calculated valve area of 1.38 cm. There is severe thickening and severe calcifcation present on the aortic valve left coronary and non-coronary cusps with severely decreased mobility and there is moderate thickening and mild calcification present on the aortic valve right coronary cusp with mildly decreased mobility. Pulmonic Valve: The pulmonic valve was normal in structure, with normal. No evidence of pumonic stenosis. Pulmonic valve regurgitation is not visualized by color flow Doppler. Aorta: There is evidence of plaque in the descending aorta; Grade I, measuring 1-62mm in size. There is evidence of a dissection in the none. Shunts: There is  no evidence of an atrial septal defect. +--------------+--------++ LEFT VENTRICLE         +--------------+--------++ PLAX 2D                +--------------+--------++ LVOT diam:    2.20 cm  +--------------+--------++ LVOT Area:    3.80 cm +--------------+--------++                        +--------------+--------++ +------------------+------------++ AORTIC VALVE                   +------------------+------------++ AV Area (Vmax):   1.55 cm     +------------------+------------++ AV Area (Vmean):  1.44 cm     +------------------+------------++ AV Area (VTI):    1.38 cm     +------------------+------------++ AV Vmax:          286.33 cm/s  +------------------+------------++ AV Vmean:         198.967 cm/s +------------------+------------++ AV VTI:           0.610 m      +------------------+------------++ AV Peak Grad:     32.8 mmHg    +------------------+------------++ AV Mean Grad:     20.7 mmHg    +------------------+------------++ LVOT Vmax:        116.85 cm/s  +------------------+------------++ LVOT Vmean:       75.550 cm/s  +------------------+------------++ LVOT VTI:         0.221 m      +------------------+------------++ LVOT/AV VTI ratio:0.36         +------------------+------------++ +-------------+---------++ MITRAL VALVE           +--------------+-------+ +-------------+---------++ SHUNTS  MV Peak grad:4.1 mmHg  +--------------+-------+ +-------------+---------++ Systemic VTI: 0.22 m  MV Mean grad:1.0 mmHg  +--------------+-------+ +-------------+---------++ Systemic Diam:2.20 cm MV Vmax:     1.01 m/s  +--------------+-------+ +-------------+---------++ MV Vmean:    45.1 cm/s +-------------+---------++ MV VTI:      0.25 m    +-------------+---------++  Val Eagle MD Electronically signed by Val Eagle MD Signature Date/Time: 07/20/2022/8:52:48 AM    Final    DG Chest Port 1  View  Result Date: 07/18/2022 CLINICAL DATA:  Status post aortic valve repair, thoracentesis EXAM: PORTABLE CHEST 1 VIEW COMPARISON:  Previous studies including the examination done earlier today FINDINGS: Transverse diameter of heart is increased. There is prosthetic aortic valve. There are no signs of alveolar pulmonary edema. Linear densities are seen in left upper and right lower lung fields suggesting subsegmental atelectasis. There is increased density in left lower lung field obscuring the left hemidiaphragm with interval improvement. There is no pneumothorax. IMPRESSION: Possible decrease in left pleural effusion. There is no pneumothorax. Electronically Signed   By: Ernie Avena M.D.   On: 07/18/2022 16:59   DG CHEST PORT 1 VIEW  Result Date: 07/18/2022 CLINICAL DATA:  Status post AVR EXAM: PORTABLE CHEST 1 VIEW COMPARISON:  Chest radiograph 07/17/2022 FINDINGS: Status post median sternotomy. Postsurgical changes from aortic valve repair. Low lung volumes. Unchanged cardiac and mediastinal contours. Redemonstrated are diffusely increased interstitial opacities and a more focal opacity at the left lung base, unchanged from prior exam. No radiographically apparent displaced rib fractures. Visualized upper abdomen is unremarkable. Degenerative changes of the bilateral AC joints. IMPRESSION: Unchanged diffusely increased interstitial opacities and a more focal opacity at the left lung base. Electronically Signed   By: Lorenza Cambridge M.D.   On: 07/18/2022 08:40   DG CHEST PORT 1 VIEW  Result Date: 07/17/2022 CLINICAL DATA:  Status post AVR. EXAM: PORTABLE CHEST 1 VIEW COMPARISON:  07/16/2022 FINDINGS: RIGHT IJ sheath remains in place. Prior median sternal. There is persistent opacity at the LEFT lung base which obscures the hemidiaphragm, stable in appearance. Minimal subsegmental atelectasis is present at the RIGHT lung base. No pneumothorax. IMPRESSION: 1. Stable appearance of LEFT basilar  opacity. 2. Minimal subsegmental atelectasis at the RIGHT lung base. Electronically Signed   By: Norva Pavlov M.D.   On: 07/17/2022 07:40   DG CHEST PORT 1 VIEW  Result Date: 07/16/2022 CLINICAL DATA:  Status post aortic valve replacement EXAM: PORTABLE CHEST 1 VIEW COMPARISON:  Chest x-ray dated July 15, 2022 FINDINGS: Cardiac and mediastinal contours are unchanged. Stable position of right IJ line sheath. Unchanged diffuse interstitial opacities and bibasilar atelectasis. Probable trace bilateral pleural effusions. No evidence of pneumothorax. IMPRESSION: Unchanged diffuse interstitial opacities, likely pulmonary edema. Electronically Signed   By: Allegra Lai M.D.   On: 07/16/2022 08:08   DG CHEST PORT 1 VIEW  Result Date: 07/15/2022 CLINICAL DATA:  Respiratory distress EXAM: PORTABLE CHEST 1 VIEW COMPARISON:  07/14/2022 FINDINGS: Cardiac shadow remains enlarged. Postsurgical changes are again seen. Right jugular sheath is noted but kinked at the skin surface stable from the prior exam. Overall inspiratory effort is poor with basilar atelectasis. Crowding of the vascular markings is again noted. No bony abnormality is seen. IMPRESSION: Stable appearance of the chest when compare with the prior exam. Electronically Signed   By: Alcide Clever M.D.   On: 07/15/2022 22:42   ECHOCARDIOGRAM LIMITED  Result Date: 07/14/2022    ECHOCARDIOGRAM LIMITED REPORT   Patient Name:   HERSHAL  Wallie Renshaw Date of Exam: 07/14/2022 Medical Rec #:  [id number]     Height:       69.0 in Accession #:    [id number]    Weight:       268.7 lb Date of Birth:  [birth year]      BSA:          2.342 m Patient Age:    77 years      BP:           102/57 mmHg Patient Gender: M             HR:           65 bpm. Exam Location:  Inpatient Procedure: 2D Echo, Intracardiac Opacification Agent, Cardiac Doppler and Color            Doppler Indications:    Dyspnea  History:        Patient has prior history of Echocardiogram examinations, most                  recent 07/05/2022. Prior CABG and Prior Cardiac Surgery.  Sonographer:    Lucy Antigua Referring Phys: [id number] LAURA P CLARK IMPRESSIONS 1. Technically difficult study with very limited visualization of cardiac structures. 2. On contrasted views, LV function appears normal with an estimated EF of 55%. There is paradoxical motion of the interventricular septum due to underlying LBBB. 3. There is a well seated bioprosthetic valve in the aortic position with no signs of paravalvular regurgitation. 4. No pericardial effusion visualized. FINDINGS  Left Ventricle: Left ventricular ejection fraction, by estimation, is 50 to 55%. The left ventricle has low normal function. Abnormal (paradoxical) septal motion consistent with post-operative status and abnormal (paradoxical) septal motion, consistent with left bundle branch block. Left ventricular diastolic parameters are consistent with Grade II diastolic dysfunction (pseudonormalization). Left ventricular diastolic function could not be evaluated due to indeterminate diastolic function. Right Ventricle: The right ventricular size is normal. No increase in right ventricular wall thickness. Right ventricular systolic function is low normal. Left Atrium: Left atrial size was not well visualized. Right Atrium: Right atrial size was not well visualized. Pericardium: There is no evidence of pericardial effusion. Mitral Valve: The mitral valve was not well visualized. Tricuspid Valve: The tricuspid valve is not well visualized. Aortic Valve: The aortic valve has been repaired/replaced. Aortic valve mean gradient measures 11.0 mmHg. Aortic valve peak gradient measures 23.0 mmHg. Aortic valve area, by VTI measures 2.62 cm. There is a bioprosthetic valve present in the aortic position. Pulmonic Valve: The pulmonic valve was not assessed. Venous: The inferior vena cava was not well visualized. LEFT VENTRICLE PLAX 2D LVIDd:         5.30 cm   Diastology LVIDs:         4.30  cm   LV e' medial:    3.26 cm/s LV PW:         1.10 cm   LV E/e' medial:  27.9 LV IVS:        1.10 cm   LV e' lateral:   5.00 cm/s LVOT diam:     2.40 cm   LV E/e' lateral: 18.2 LV SV:         112 LV SV Index:   48 LVOT Area:     4.52 cm  RIGHT VENTRICLE RV S prime:     7.62 cm/s TAPSE (M-mode): 1.0 cm LEFT ATRIUM           Index  RIGHT ATRIUM           Index LA Vol (A4C): 43.0 ml 18.36 ml/m  RA Area:     14.60 cm                                    RA Volume:   31.50 ml  13.45 ml/m  AORTIC VALVE AV Area (Vmax):    2.70 cm AV Area (Vmean):   2.91 cm AV Area (VTI):     2.62 cm AV Vmax:           240.00 cm/s AV Vmean:          150.000 cm/s AV VTI:            0.427 m AV Peak Grad:      23.0 mmHg AV Mean Grad:      11.0 mmHg LVOT Vmax:         143.00 cm/s LVOT Vmean:        96.400 cm/s LVOT VTI:          0.247 m LVOT/AV VTI ratio: 0.58  AORTA Ao Root diam: 3.50 cm Ao Asc diam:  3.40 cm MITRAL VALVE MV Area (PHT): 4.60 cm    SHUNTS MV Decel Time: 165 msec    Systemic VTI:  0.25 m MV E velocity: 90.80 cm/s  Systemic Diam: 2.40 cm MV A velocity: 83.40 cm/s MV E/A ratio:  1.09 Aditya Sabharwal Electronically signed by Dorthula Nettles Signature Date/Time: 07/14/2022/6:00:23 PM    Final    DG CHEST PORT 1 VIEW  Result Date: 07/14/2022 CLINICAL DATA:  Hypoxia.  Worsening chest pain on left side. EXAM: PORTABLE CHEST 1 VIEW COMPARISON:  07/13/2022. FINDINGS: 1424 hours. Right IJ approach vascular sheath remains in place. Stable postoperative changes of median sternotomy and aortic valve replacement. Low lung volumes accentuate the pulmonary vasculature and cardiomediastinal silhouette. Unchanged linear atelectasis throughout the left-greater-than-right lungs. Probable small left pleural effusion. No pneumothorax. IMPRESSION: Low lung volumes with scattered linear atelectasis throughout the left-greater-than-right lungs and probable small left pleural effusion. Electronically Signed   By: Orvan Falconer M.D.    On: 07/14/2022 14:33   DG Chest Port 1 View  Result Date: 07/13/2022 CLINICAL DATA:  CABG. EXAM: PORTABLE CHEST 1 VIEW COMPARISON:  07/12/2022 FINDINGS: The cardio pericardial silhouette is enlarged. Low volume film with asymmetric elevation right hemidiaphragm. There is pulmonary vascular congestion without overt pulmonary edema. Subsegmental atelectasis noted bilaterally, left greater than right. Chest tube and mediastinal drain have been removed in the interval. Pulmonary artery catheter has been removed with right IJ sheath remaining in place. IMPRESSION: 1. Interval removal of chest tube and mediastinal drain. No pneumothorax. 2. Low volume film with bibasilar atelectasis. Electronically Signed   By: Kennith Center M.D.   On: 07/13/2022 08:16   DG Chest Port 1 View  Result Date: 07/12/2022 CLINICAL DATA:  [id number], recent two-vessel CABG.  AVR.  [id number]. EXAM: PORTABLE CHEST 1 VIEW COMPARISON:  Portable chest yesterday at 1:12 p.m. FINDINGS: 6:02 a.m. ETT/NGT interval removal. Left chest tube remains with tip in the lateral apical area. A mediastinal drain also remains in place in the midline. Right IJ Swan-Ganz catheter has been advanced into the proximal right main pulmonary artery. CABG changes and aortic valve prosthesis with intact median sternotomy sutures. The aorta is tortuous and ectatic with patchy calcification, stable mediastinal configuration. Stable cardiomegaly. There are hypoinflated lungs  with lower zonal linear perihilar atelectatic bands but no convincing focal pneumonia. There is no substantial pleural effusion or visible pneumothorax. The overall aeration is not significantly changed postextubation. No new abnormality. IMPRESSION: 1. ETT/NGT interval removal. 2. Right IJ Swan-Ganz catheter has been advanced into the proximal right main pulmonary artery. 3. Stable cardiomegaly and CABG changes. 4. Low lung volumes with perihilar atelectasis. No new abnormality. Electronically Signed    By: Almira Bar M.D.   On: 07/12/2022 07:52   DG Chest Port 1 View  Result Date: 07/11/2022 CLINICAL DATA:  Post AVR EXAM: PORTABLE CHEST 1 VIEW COMPARISON:  Portable exam 1312 hours compared to preoperative study of 07/10/2022 FINDINGS: Tip of endotracheal tube projects 5.4 cm above carina. Nasogastric tube extends into stomach. Mediastinal drain and LEFT thoracostomy tube present. RIGHT jugular Swan-Ganz catheter with tip projecting over proximal RIGHT pulmonary artery. Slightly prominent cardiac silhouette post AVR. Atherosclerotic calcification aorta. Bibasilar atelectasis without pleural effusion or pneumothorax. IMPRESSION: Expected postoperative changes post AVR. Electronically Signed   By: Ulyses Southward M.D.   On: 07/11/2022 13:22   EP STUDY  Result Date: 07/11/2022 See surgical note for result.  DG Chest 2 View  Result Date: 07/10/2022 CLINICAL DATA:  Aortic stenosis.  Preop. EXAM: CHEST - 2 VIEW COMPARISON:  July 03, 2022. FINDINGS: Stable cardiomediastinal silhouette. Both lungs are clear. The visualized skeletal structures are unremarkable. IMPRESSION: No active cardiopulmonary disease. Electronically Signed   By: Lupita Raider M.D.   On: 07/10/2022 10:09   VAS US DOPPLER PRE CABG  Result Date: 07/08/2022 PREOPERATIVE VASCULAR EVALUATION Patient Name:  PRESTEN JOOST  Date of Exam:   07/08/2022 Medical Rec #: [id number]      Accession #:    [id number] Date of Birth: [birth year]       Patient Gender: M Patient Age:   25 years Exam Location:  [facility] Procedure:      VAS US DOPPLER PRE CABG Referring Phys: Eugenio Hoes --------------------------------------------------------------------------------  Indications:      TIA and pre-CABG. Risk Factors:     Hyperlipidemia, coronary artery disease. Comparison Study: No prior studies. Performing Technologist: McKayla Maag RVT, [place]  Examination Guidelines: A complete evaluation includes B-mode imaging, spectral Doppler, color Doppler, and  power Doppler as needed of all accessible portions of each vessel. Bilateral testing is considered an integral part of a complete examination. Limited examinations for reoccurring indications may be performed as noted.  Right Carotid Findings: +---------+--------+-------+--------+------------------------+-----------------+          PSV cm/sEDV    StenosisDescribe                Comments                           cm/s                                                     +---------+--------+-------+--------+------------------------+-----------------+ CCA Prox 85      7                                      intimal  thickening        +---------+--------+-------+--------+------------------------+-----------------+ CCA      56      12                                     intimal           Distal                                                  thickening        +---------+--------+-------+--------+------------------------+-----------------+ ICA Prox 72      6      1-39%   heterogenous and                                                          irregular                                 +---------+--------+-------+--------+------------------------+-----------------+ ICA Mid  52      11                                                       +---------+--------+-------+--------+------------------------+-----------------+ ICA      61      12                                                       Distal                                                                    +---------+--------+-------+--------+------------------------+-----------------+ ECA      100     10                                                       +---------+--------+-------+--------+------------------------+-----------------+ +----------+--------+-------+----------------+------------+           PSV cm/sEDV  cmsDescribe        Arm Pressure +----------+--------+-------+----------------+------------+ Subclavian125            Multiphasic, WNL             +----------+--------+-------+----------------+------------+ +---------+--------+--+--------+-+---------+ VertebralPSV cm/s26EDV cm/s8Antegrade +---------+--------+--+--------+-+---------+ Left Carotid Findings: +----------+-------+--------+--------+-----------------------+-----------------+           PSV    EDV cm/sStenosisDescribe               Comments  cm/s                                                            +----------+-------+--------+--------+-----------------------+-----------------+ CCA Prox  110    17                                     intimal                                                                   thickening        +----------+-------+--------+--------+-----------------------+-----------------+ CCA Distal93     14                                     intimal                                                                   thickening        +----------+-------+--------+--------+-----------------------+-----------------+ ICA Prox  33     9       1-39%   homogeneous and                                                           irregular                                +----------+-------+--------+--------+-----------------------+-----------------+ ICA Mid   60     19                                                       +----------+-------+--------+--------+-----------------------+-----------------+ ICA Distal59     16                                                       +----------+-------+--------+--------+-----------------------+-----------------+ ECA       89     12                                                       +----------+-------+--------+--------+-----------------------+-----------------+   +----------+--------+--------+----------------+------------+  SubclavianPSV cm/sEDV cm/sDescribe        Arm Pressure +----------+--------+--------+----------------+------------+           111             Multiphasic, WNL             +----------+--------+--------+----------------+------------+ +---------+--------+--+--------+---------+ VertebralPSV cm/s24EDV cm/sAntegrade +---------+--------+--+--------+---------+  ABI Findings: +---------+------------------+-----+---------+--------+ Right    Rt Pressure (mmHg)IndexWaveform Comment  +---------+------------------+-----+---------+--------+ Brachial 129                    triphasic         +---------+------------------+-----+---------+--------+ PTA      163               1.26 triphasic         +---------+------------------+-----+---------+--------+ DP       157               1.22 triphasic         +---------+------------------+-----+---------+--------+ Ernst Spell               0.95 Normal            +---------+------------------+-----+---------+--------+ +---------+------------------+-----+---------+---------------------------------+ Left     Lt Pressure (mmHg)IndexWaveform Comment                           +---------+------------------+-----+---------+---------------------------------+ Brachial                        triphasicNo pressure obtianed due to IV                                             placement.                        +---------+------------------+-----+---------+---------------------------------+ PTA      162               1.26 triphasic                                  +---------+------------------+-----+---------+---------------------------------+ DP       148               1.15 triphasic                                  +---------+------------------+-----+---------+---------------------------------+ Great Toe136               1.05 Normal                                      +---------+------------------+-----+---------+---------------------------------+ +-------+---------------+----------------+ ABI/TBIToday's ABI/TBIPrevious ABI/TBI +-------+---------------+----------------+ Right  1.26/ 0.95                      +-------+---------------+----------------+ Left   1.26/ 1.05                      +-------+---------------+----------------+  Right Doppler Findings: +--------+--------+-----+---------+--------+ Site    PressureIndexDoppler  Comments +--------+--------+-----+---------+--------+ [id number]          triphasic         +--------+--------+-----+---------+--------+ Radial  triphasic         +--------+--------+-----+---------+--------+ Ulnar                triphasic         +--------+--------+-----+---------+--------+  Left Doppler Findings: +--------+--------+-----+---------+-----------------------------------------+ Site    PressureIndexDoppler  Comments                                  +--------+--------+-----+---------+-----------------------------------------+ Brachial             triphasicNo pressure obtianed due to IV placement. +--------+--------+-----+---------+-----------------------------------------+ Radial               triphasic                                          +--------+--------+-----+---------+-----------------------------------------+ Ulnar                triphasic                                          +--------+--------+-----+---------+-----------------------------------------+   Summary: Right Carotid: Velocities in the right ICA are consistent with a 1-39% stenosis. Left Carotid: Velocities in the left ICA are consistent with a 1-39% stenosis. Vertebrals:  Bilateral vertebral arteries demonstrate antegrade flow. Subclavians: Normal flow hemodynamics were seen in bilateral subclavian              arteries. Right ABI: Resting right ankle-brachial index is within normal range.  The right toe-brachial index is normal. Left ABI: Resting left ankle-brachial index is within normal range. The left toe-brachial index is normal. Right Upper Extremity: Doppler waveforms remain within normal limits with right radial compression. Doppler waveforms decrease <50% with right ulnar compression. Left Upper Extremity: Doppler waveforms decrease <50% w left radial compression. Doppler waveforms remain within normal limits with left ulnar compression.  Electronically signed by Waverly Ferrari MD on 07/08/2022 at 4:40:56 PM.    Final    CT CHEST WO CONTRAST  Result Date: 07/05/2022 CLINICAL DATA:  Thoracic aorta disease, pre-op planning EXAM: CT CHEST WITHOUT CONTRAST TECHNIQUE: Multidetector CT imaging of the chest was performed following the standard protocol without IV contrast. RADIATION DOSE REDUCTION: This exam was performed according to the departmental dose-optimization program which includes automated exposure control, adjustment of the mA and/or kV according to patient size and/or use of iterative reconstruction technique. COMPARISON:  None Available. FINDINGS: Cardiovascular: Diffuse coronary artery calcifications. Moderate aortic calcifications. No aneurysm. Maximum aortic diameter 3.6 cm in the ascending thoracic aorta. Mediastinum/Nodes: Scattered mediastinal lymph nodes. No mediastinal, hilar, or axillary adenopathy. Trachea and esophagus are unremarkable. Thyroid unremarkable. Lungs/Pleura: No confluent opacities or effusions. Upper Abdomen: No acute findings Musculoskeletal: Chest wall soft tissues are unremarkable. No acute bony abnormality. IMPRESSION: Diffuse coronary artery disease. No evidence of aortic aneurysm. No acute cardiopulmonary disease. Aortic Atherosclerosis (ICD10-I70.0). Electronically Signed   By: Charlett Nose M.D.   On: 07/05/2022 21:28   ECHOCARDIOGRAM COMPLETE  Result Date: 07/05/2022    ECHOCARDIOGRAM REPORT   Patient Name:   ORRY SIGL Date of Exam:  07/05/2022 Medical Rec #:  [id number]     Height:       69.0 in Accession #:    [id number]    Weight:  258.0 lb Date of Birth:  1945/05/05      BSA:          2.302 m Patient Age:    77 years      BP:           128/79 mmHg Patient Gender: M             HR:           58 bpm. Exam Location:  Inpatient Procedure: Color Doppler, Cardiac Doppler and 2D Echo Indications:    CAD  History:        Patient has prior history of Echocardiogram examinations. CAD,                 TIA, Arrythmias:LBBB and Bradycardia, Signs/Symptoms:Murmur;                 Risk Factors:Hypertension.  Sonographer:    Lucy Antigua Referring Phys: Kathleene Hazel  Sonographer Comments: Patient is obese. IMPRESSIONS  1. The aortic valve is calcified. There is severe calcifcation of the aortic valve. There is moderate thickening of the aortic valve. Aortic valve regurgitation is mild. Moderate aortic valve stenosis. Aortic valve mean gradient measures 28.0 mmHg. Aortic valve Vmax measures 3.63 m/s.  2. Left ventricular ejection fraction, by estimation, is 60 to 65%. The left ventricle has normal function. The left ventricle has no regional wall motion abnormalities. Left ventricular diastolic parameters are consistent with Grade I diastolic dysfunction (impaired relaxation).  3. Right ventricular systolic function is normal. The right ventricular size is normal.  4. Left atrial size was mildly dilated.  5. The mitral valve is normal in structure. No evidence of mitral valve regurgitation. No evidence of mitral stenosis.  6. The inferior vena cava is normal in size with greater than 50% respiratory variability, suggesting right atrial pressure of 3 mmHg. FINDINGS  Left Ventricle: Left ventricular ejection fraction, by estimation, is 60 to 65%. The left ventricle has normal function. The left ventricle has no regional wall motion abnormalities. The left ventricular internal cavity size was normal in size. There is  no left ventricular  hypertrophy. Left ventricular diastolic parameters are consistent with Grade I diastolic dysfunction (impaired relaxation). Right Ventricle: The right ventricular size is normal. No increase in right ventricular wall thickness. Right ventricular systolic function is normal. Left Atrium: Left atrial size was mildly dilated. Right Atrium: Right atrial size was normal in size. Pericardium: There is no evidence of pericardial effusion. Mitral Valve: The mitral valve is normal in structure. Mild mitral annular calcification. No evidence of mitral valve regurgitation. No evidence of mitral valve stenosis. Tricuspid Valve: The tricuspid valve is normal in structure. Tricuspid valve regurgitation is not demonstrated. No evidence of tricuspid stenosis. Aortic Valve: The aortic valve is calcified. There is severe calcifcation of the aortic valve. There is moderate thickening of the aortic valve. Aortic valve regurgitation is mild. Moderate aortic stenosis is present. Aortic valve mean gradient measures 28.0 mmHg. Aortic valve peak gradient measures 52.7 mmHg. Pulmonic Valve: The pulmonic valve was normal in structure. Pulmonic valve regurgitation is trivial. No evidence of pulmonic stenosis. Aorta: The aortic root is normal in size and structure. Venous: The inferior vena cava is normal in size with greater than 50% respiratory variability, suggesting right atrial pressure of 3 mmHg. IAS/Shunts: No atrial level shunt detected by color flow Doppler.  LEFT VENTRICLE PLAX 2D LVIDd:         6.30 cm Diastology LVIDs:  4.60 cm LV e' medial:    4.46 cm/s LV PW:         1.00 cm LV E/e' medial:  7.1 LV IVS:        1.00 cm LV e' lateral:   7.07 cm/s                        LV E/e' lateral: 4.5  RIGHT VENTRICLE RV S prime:     14.00 cm/s TAPSE (M-mode): 2.1 cm LEFT ATRIUM             Index LA diam:        4.50 cm 1.95 cm/m LA Vol (A2C):   43.5 ml 18.90 ml/m LA Vol (A4C):   31.2 ml 13.55 ml/m LA Biplane Vol: 40.1 ml 17.42 ml/m   AORTIC VALVE AV Vmax:           363.00 cm/s AV Vmean:          247.000 cm/s AV VTI:            0.752 m AV Peak Grad:      52.7 mmHg AV Mean Grad:      28.0 mmHg LVOT Vmax:         113.00 cm/s LVOT Vmean:        73.400 cm/s LVOT VTI:          0.216 m LVOT/AV VTI ratio: 0.29  AORTA Ao Asc diam: 3.90 cm MITRAL VALVE MV Area (PHT): 2.16 cm     SHUNTS MV Decel Time: 352 msec     Systemic VTI: 0.22 m MV E velocity: 31.83 cm/s MV A velocity: 106.00 cm/s MV E/A ratio:  0.30 Donato Schultz MD Electronically signed by Donato Schultz MD Signature Date/Time: 07/05/2022/3:08:16 PM    Final    CARDIAC CATHETERIZATION  Result Date: 07/04/2022   Dist RCA lesion is 50% stenosed.   2nd Diag lesion is 25% stenosed.   1st Diag lesion is 75% stenosed.   RPDA lesion is 30% stenosed.   RPAV lesion is 60% stenosed.   Prox LAD to Mid LAD lesion is 20% stenosed.   Ost Cx to Prox Cx lesion is 90% stenosed.   Mid LM to Ost LAD lesion is 80% stenosed.   Ost LAD lesion is 80% stenosed.   Previously placed 1st Mrg stent of unknown type is  widely patent. Severe distal left main stenosis (IVUS minimum lumen area of 4.0 mm2) Severe ostial LAD stenosis (IVUD minimum lumen area of 3.5 mm2). Patent mid LAD stent. Severe ostial Circumflex stenosis. Patent obtuse marginal stent Large dominant RCA with moderate distal stenosis, patent PDA stent, moderate posterolateral artery stenosis. Severe aortic stenosis by echo (Cath data: mean gradient 32.8 mmHg, peak to peak gradient 42 mmHg) Recommendations: Severe distal left main stenosis involving both the LAD and Circumflex, confirmed with IVUS imaging. Severe aortic stenosis. He will need a CT surgery consult for CABG and AVR. He has been on Plavix so I will begin holding that tonight. Repeat echo tomorrow. 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 He will also need Plavix washout before surgery. Resume IV heparin 8 hours post sheath pull. Will call CT  surgery to see him tomorrow.   DG Chest 2 View  Result Date: 07/03/2022 CLINICAL DATA:  Provided history: Chest pain. EXAM: CHEST - 2 VIEW COMPARISON:  Prior chest radiographs 11/25/2021 and earlier. FINDINGS: Stable  cardiac and mediastinal contours as compared to the prior chest radiographs of 11/25/2021. Aortic atherosclerosis. Chronic elevation of the right hemidiaphragm. No appreciable airspace consolidation or pulmonary edema. No evidence of pleural effusion or pneumothorax. No acute osseous abnormality identified. Surgical clips within the upper abdomen. IMPRESSION: No evidence of acute cardiopulmonary abnormality. Aortic Atherosclerosis (ICD10-I70.0). Electronically Signed   By: Jackey LogeKyle  Golden D.O.   On: 07/03/2022 16:10          No data to display          No results found for: "NITRICOXIDE"      Assessment & Plan:   OSA on CPAP He has a history of OSA, currently untreated. Noted to have nocturnal desaturations during his recent hospital stay. He has snoring, daytime sleepiness, nocturnal apneic events. BMI 37. Epworth 7. Given this,  I am concerned he still has sleep disordered breathing with obstructive sleep apnea. With his recent history and nocturnal oxygen requirements, he will need an in lab sleep study for further evaluation. He will continue supplemental oxygen in the interim.   - discussed how weight can impact sleep and risk for sleep disordered breathing - discussed options to assist with weight loss: combination of diet modification, cardiovascular and strength training exercises   - had an extensive discussion regarding the adverse health consequences related to untreated sleep disordered breathing - specifically discussed the risks for hypertension, coronary artery disease, cardiac dysrhythmias, cerebrovascular disease, and diabetes - lifestyle modification discussed   - discussed how sleep disruption can increase risk of accidents, particularly when driving -  safe driving practices were discussed  Patient Instructions  Given your symptoms and history, I am concerned that you may have sleep disordered breathing with sleep apnea. You will need an in lab sleep study for further evaluation. Someone will contact you to schedule this.   We discussed how untreated sleep apnea puts an individual at risk for cardiac arrhthymias, pulm HTN, DM, stroke and increases their risk for daytime accidents. We also briefly reviewed treatment options including weight loss, side sleeping position, oral appliance, CPAP therapy or referral to ENT for possible surgical options  Use caution when driving and pull over if you become sleepy.  Use your incentive spirometer 10 times an hour while awake over the next week or until your back to moving how you normally do  Call if you develop more of an increased productive cough, fevers, any blood in your mucus, increased shortness of breath or chest congestion  Continue to use supplemental oxygen 2 lpm at night   Follow up in 6 weeks with Dr Wynona Neatlalere (new pt slot) to go over sleep study results and repeat your chest x ray, or sooner, if needed     Pneumonia of left lower lobe due to infectious organism Improving LLL opacity. No infectious symptoms and clinically stable. Will plan to repeat at f/u to ensure resolution. Encouraged him to work on deep breathing exercises with IS at home and activity as tolerated. OOB during the day. Start cardiac rehab once available.   Acute respiratory failure with hypoxia (HCC) No daytime O2 requirement since his hospitalization. Goal >88-90%. See above.   Pleural effusion Bilateral, stable small pleural effusions. Likely secondary to volume overload. Fluid studies not sent from previous thoracentesis. His sputum cultures were no growth. Clinically improving. Continue current diuretic regimen. Advised on return precautions. See above.    I spent 45 minutes of dedicated to the care of this  patient on the date of  this encounter to include pre-visit review of records, face-to-face time with the patient discussing conditions above, post visit ordering of testing, clinical documentation with the electronic health record, making appropriate referrals as documented, and communicating necessary findings to members of the patients care team.  Noemi Chapel, NP 07/31/2022  Pt aware and understands NP's role.

## 2022-07-30 NOTE — Patient Instructions (Signed)
Given your symptoms and history, I am concerned that you may have sleep disordered breathing with sleep apnea. You will need an in lab sleep study for further evaluation. Someone will contact you to schedule this.   We discussed how untreated sleep apnea puts an individual at risk for cardiac arrhthymias, pulm HTN, DM, stroke and increases their risk for daytime accidents. We also briefly reviewed treatment options including weight loss, side sleeping position, oral appliance, CPAP therapy or referral to ENT for possible surgical options  Use caution when driving and pull over if you become sleepy.  Use your incentive spirometer 10 times an hour while awake over the next week or until your back to moving how you normally do  Call if you develop more of an increased productive cough, fevers, any blood in your mucus, increased shortness of breath or chest congestion  Continue to use supplemental oxygen 2 lpm at night   Follow up in 6 weeks with Dr Wynona Neat (new pt slot) to go over sleep study results and repeat your chest x ray, or sooner, if needed

## 2022-07-31 DIAGNOSIS — J90 Pleural effusion, not elsewhere classified: Secondary | ICD-10-CM

## 2022-07-31 HISTORY — DX: Pleural effusion, not elsewhere classified: J90

## 2022-07-31 NOTE — Assessment & Plan Note (Signed)
He has a history of OSA, currently untreated. Noted to have nocturnal desaturations during his recent hospital stay. He has snoring, daytime sleepiness, nocturnal apneic events. BMI 37. Epworth 7. Given this,  I am concerned he still has sleep disordered breathing with obstructive sleep apnea. With his recent history and nocturnal oxygen requirements, he will need an in lab sleep study for further evaluation. He will continue supplemental oxygen in the interim.   - discussed how weight can impact sleep and risk for sleep disordered breathing - discussed options to assist with weight loss: combination of diet modification, cardiovascular and strength training exercises   - had an extensive discussion regarding the adverse health consequences related to untreated sleep disordered breathing - specifically discussed the risks for hypertension, coronary artery disease, cardiac dysrhythmias, cerebrovascular disease, and diabetes - lifestyle modification discussed   - discussed how sleep disruption can increase risk of accidents, particularly when driving - safe driving practices were discussed  Patient Instructions  Given your symptoms and history, I am concerned that you may have sleep disordered breathing with sleep apnea. You will need an in lab sleep study for further evaluation. Someone will contact you to schedule this.   We discussed how untreated sleep apnea puts an individual at risk for cardiac arrhthymias, pulm HTN, DM, stroke and increases their risk for daytime accidents. We also briefly reviewed treatment options including weight loss, side sleeping position, oral appliance, CPAP therapy or referral to ENT for possible surgical options  Use caution when driving and pull over if you become sleepy.  Use your incentive spirometer 10 times an hour while awake over the next week or until your back to moving how you normally do  Call if you develop more of an increased productive cough, fevers,  any blood in your mucus, increased shortness of breath or chest congestion  Continue to use supplemental oxygen 2 lpm at night   Follow up in 6 weeks with Dr Wynona Neat (new pt slot) to go over sleep study results and repeat your chest x ray, or sooner, if needed

## 2022-07-31 NOTE — Assessment & Plan Note (Signed)
Improving LLL opacity. No infectious symptoms and clinically stable. Will plan to repeat at f/u to ensure resolution. Encouraged him to work on deep breathing exercises with IS at home and activity as tolerated. OOB during the day. Start cardiac rehab once available.

## 2022-07-31 NOTE — Assessment & Plan Note (Signed)
Bilateral, stable small pleural effusions. Likely secondary to volume overload. Fluid studies not sent from previous thoracentesis. His sputum cultures were no growth. Clinically improving. Continue current diuretic regimen. Advised on return precautions. See above.

## 2022-07-31 NOTE — Assessment & Plan Note (Signed)
No daytime O2 requirement since his hospitalization. Goal >88-90%. See above.

## 2022-08-01 ENCOUNTER — Encounter: Payer: Self-pay | Admitting: Nurse Practitioner

## 2022-08-03 NOTE — Progress Notes (Signed)
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 He is mostly walking in the house and has not ventured out much. He wishes to drive and sleep on side. He has a sleep study scheduled end of the month. His leg wound has closed and no longer draining      Past Medical History:  Diagnosis Date   Aortic stenosis 08/10/2015   Bilateral leg edema 08/22/2015   Bradycardia 06/01/2017   Cellulitis of left lower extremity 04/29/2017   Coronary artery disease involving native coronary artery 10/22/2017   DOE (dyspnea on exertion) 05/04/2017   Essential hypertension 08/13/2015   Heart murmur 04/29/2017   Kissing, osteophytes 08/22/2015   LBBB (left bundle branch block) 08/22/2015   Lipid disorder 11/30/2017   Lumbar facet joint pain 08/22/2015   Medicare annual wellness visit, subsequent 06/02/2016   Morbid obesity 10/22/2017   Murmur 04/29/2017   Myopathy 09/02/2019   Formatting of this note might be different from the original. Secondary to high freq statin use.   Need for vaccination for Strep pneumoniae 03/27/2017   Non-ST elevation myocardial infarction (NSTEMI), subendocardial infarction, subsequent episode of care 11/30/2017   Obesity, Class II, BMI 35-39.9 08/10/2015   OSA on CPAP 12/02/2017   Pure hypercholesterolemia 05/24/2015   Seborrheic keratoses 03/27/2017   Overview:  Of the right forehead.   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   Procedure: AORTIC VALVE REPLACEMENT (AVR) USING INSPIRIS VALVE SIZE ;  Surgeon: Eugenio Hoes, MD;  Location: [facility] OR;  Service: Open Heart Surgery;  Laterality: N/A;   CARDIAC CATHETERIZATION     CHOLECYSTECTOMY     CORONARY ANGIOPLASTY     CORONARY ARTERY BYPASS GRAFT N/A 07/11/2022   Procedure: CORONARY ARTERY BYPASS GRAFTING (CABG) X TWO, USING LEFT INTERNAL MAMMARY AND RIGHT LEG GREATER SAPHENOUS VEIN HARVESTED ENDOSCOPICALLY;  Surgeon: Eugenio Hoes, MD;  Location: [facility] OR;  Service: Open Heart Surgery;  Laterality: N/A;   CORONARY ATHERECTOMY N/A 08/16/2019   Procedure: CORONARY ATHERECTOMY;  Surgeon: Corky Crafts, MD;  Location: [facility] INVASIVE CV LAB;  Service: Cardiovascular;  Laterality: N/A;   CORONARY BALLOON ANGIOPLASTY N/A 07/28/2019   Procedure: CORONARY BALLOON ANGIOPLASTY;  Surgeon: Corky Crafts, MD;  Location: [facility] INVASIVE CV LAB;  Service: Cardiovascular;  Laterality: N/A;   CORONARY ULTRASOUND/IVUS N/A 07/28/2019   Procedure: Intravascular Ultrasound/IVUS;  Surgeon: Corky Crafts, MD;  Location: [facility] INVASIVE CV LAB;  Service: Cardiovascular;  Laterality: N/A;   CORONARY ULTRASOUND/IVUS N/A 08/16/2019   Procedure: Intravascular Ultrasound/IVUS;  Surgeon: Corky Crafts, MD;  Location: [facility] INVASIVE CV LAB;  Service: Cardiovascular;  Laterality: N/A;   CORONARY ULTRASOUND/IVUS N/A 07/04/2022   Procedure: Intravascular Ultrasound/IVUS;  Surgeon: Kathleene Hazel, MD;  Location: [facility] INVASIVE CV LAB;  Service: Cardiovascular;  Laterality: N/A;   ESOPHAGOGASTRODUODENOSCOPY  LEFT HEART CATH AND CORONARY ANGIOGRAPHY N/A 07/28/2019   Procedure: LEFT HEART CATH AND CORONARY ANGIOGRAPHY;  Surgeon: Corky Crafts, MD;  Location: [facility] INVASIVE CV LAB;  Service: Cardiovascular;  Laterality: N/A;   LEFT HEART CATH AND CORONARY ANGIOGRAPHY N/A 08/16/2019   Procedure: LEFT HEART CATH AND CORONARY ANGIOGRAPHY;  Surgeon: Corky Crafts, MD;  Location:  [facility] INVASIVE CV LAB;  Service: Cardiovascular;  Laterality: N/A;   RIGHT/LEFT HEART CATH AND CORONARY ANGIOGRAPHY N/A 07/04/2022   Procedure: RIGHT/LEFT HEART CATH AND CORONARY ANGIOGRAPHY;  Surgeon: Kathleene Hazel, MD;  Location: [facility] INVASIVE CV LAB;  Service: Cardiovascular;  Laterality: N/A;   TEE WITHOUT CARDIOVERSION N/A 07/11/2022   Procedure: TRANSESOPHAGEAL ECHOCARDIOGRAM;  Surgeon: Eugenio Hoes, MD;  Location: [facility] OR;  Service: Open Heart Surgery;  Laterality: N/A;    Social History   Tobacco Use  Smoking Status Former   Types: Cigarettes   Quit date: 1973   Years since quitting: 51.3   Passive exposure: Past  Smokeless Tobacco Never    Social History   Substance and Sexual Activity  Alcohol Use Yes   Alcohol/week: 7.0 standard drinks of alcohol   Types: 7 Glasses of wine per week   Comment: per week    Social History   Socioeconomic History   Marital status: Married    Spouse name: Not on file   Number of children: Not on file   Years of education: Not on file   Highest education level: Not on file  Occupational History   Not on file  Tobacco Use   Smoking status: Former    Types: Cigarettes    Quit date: 1973    Years since quitting: 51.3    Passive exposure: Past   Smokeless tobacco: Never  Vaping Use   Vaping Use: Never used  Substance and Sexual Activity   Alcohol use: Yes    Alcohol/week: 7.0 standard drinks of alcohol    Types: 7 Glasses of wine per week    Comment: per week   Drug use: No   Sexual activity: Not on file  Other Topics Concern   Not on file  Social History Narrative   Not on file   Social Determinants of Health   Financial Resource Strain: Not on file  Food Insecurity: No Food Insecurity (07/04/2022)   Hunger Vital Sign    Worried About Running Out of Food in the Last Year: Never true    Ran Out of Food in the Last Year: Never true  Transportation Needs: No Transportation Needs (07/04/2022)   PRAPARE - Therapist, art (Medical): No    Lack of Transportation (Non-Medical): No  Physical Activity: Not on file  Stress: Not on file  Social Connections: Not on file  Intimate Partner Violence: Not At Risk (07/04/2022)   Humiliation, Afraid, Rape, and Kick questionnaire    Fear of Current or Ex-Partner: No    Emotionally Abused: No    Physically Abused: No    Sexually Abused: No    Allergies  Allergen Reactions   Penicillins     Had rash in his 30s-40s with this as well as recurrence when re-trialed   Statins Other (See Comments)    Muscle pains; increasing with continued use.   Muscle pains; increasing with continued use.    Isosorbide Other (See Comments)    "makes me dizzy and I have headaches"   Latex Rash    Has noticed with bandages in  past, leaves a sore on skin but more recently with APAP mask     Current Outpatient Medications  Medication Sig Dispense Refill   amiodarone (PACERONE) 200 MG tablet Take 1 tablet (200 mg total) by mouth daily. 30 tablet 1   aspirin EC 81 MG tablet Take 81 mg by mouth at bedtime.     Cholecalciferol 125 MCG (5000 UT) TABS Take 1 tablet by mouth daily.     clopidogrel (PLAVIX) 75 MG tablet TAKE 1 TABLET BY MOUTH AT  BEDTIME 100 tablet 2   ezetimibe (ZETIA) 10 MG tablet Take 10 mg by mouth at bedtime.     ferrous sulfate 325 (65 FE) MG tablet Take 1 tablet (325 mg total) by mouth daily with breakfast. 30 tablet 3   furosemide (LASIX) 40 MG tablet Take 1 tablet (40 mg total) by mouth daily. 7 tablet 0   gabapentin (NEURONTIN) 100 MG capsule Take 1 capsule (100 mg total) by mouth 2 (two) times daily. 60 capsule 1   metoprolol tartrate (LOPRESSOR) 25 MG tablet Take 1 tablet (25 mg total) by mouth 2 (two) times daily. 60 tablet 1   potassium chloride SA (KLOR-CON M) 20 MEQ tablet Take 1 tablet (20 mEq total) by mouth daily. 7 tablet 0   rosuvastatin (CRESTOR) 20 MG tablet Take 1 tablet (20 mg total) by mouth daily. 30 tablet 1   No current  facility-administered medications for this visit.     Family History  Problem Relation Age of Onset   Cancer Mother    Diabetes Sister    Diabetes Brother    Hypertension Brother        Physical Exam: Lungs: decreased in both bases Card: RR with no murmur Incisions well healed Ext: 1 + pitting edema Neuro: alert and oriented     Diagnostic Studies & Laboratory data: I have personally reviewed the following studies and agree with the findings     Recent Radiology Findings:       Recent Lab Findings: Lab Results  Component Value Date   WBC 7.9 07/20/2022   HGB 9.8 (L) 07/20/2022   HCT 29.0 (L) 07/20/2022   PLT 251 07/20/2022   GLUCOSE 108 (H) 07/20/2022   CHOL 184 07/04/2022   TRIG 196 (H) 07/04/2022   HDL 37 (L) 07/04/2022   LDLCALC 108 (H) 07/04/2022   ALT 37 07/04/2022   AST 26 07/04/2022   NA 133 (L) 07/20/2022   K 4.4 07/20/2022   CL 100 07/20/2022   CREATININE 1.15 07/20/2022   BUN 30 (H) 07/20/2022   CO2 27 07/20/2022   TSH 4.966 (H) 07/04/2022   INR 1.3 (H) 07/11/2022   HGBA1C 5.3 07/10/2022      Assessment / Plan:     Doing well after AVR/CABG. We discussed restrictions and he may drive now off pain meds and he will try sleeping on side and see if comfortable. Awaiting sleep study to remove night O2. Will stop amiodarone and reorder daily lasix and potassium and have cardiology assess to determine if needs ongoing. Pt pleased with care   I have spent 20 min in review of the records, viewing studies and in face to face with patient and in coordination of future care    Eugenio Hoes 08/03/2022 7:44 PM

## 2022-08-04 ENCOUNTER — Other Ambulatory Visit: Payer: Self-pay | Admitting: Cardiology

## 2022-08-04 ENCOUNTER — Ambulatory Visit (INDEPENDENT_AMBULATORY_CARE_PROVIDER_SITE_OTHER): Payer: Self-pay | Admitting: Thoracic Surgery (Cardiothoracic Vascular Surgery)

## 2022-08-04 ENCOUNTER — Encounter: Payer: Self-pay | Admitting: Thoracic Surgery (Cardiothoracic Vascular Surgery)

## 2022-08-04 ENCOUNTER — Other Ambulatory Visit: Payer: Self-pay | Admitting: *Deleted

## 2022-08-04 ENCOUNTER — Telehealth (HOSPITAL_COMMUNITY): Payer: Self-pay

## 2022-08-04 VITALS — BP 130/68 | HR 58 | Resp 20 | Ht 69.0 in | Wt 250.0 lb

## 2022-08-04 DIAGNOSIS — Z952 Presence of prosthetic heart valve: Secondary | ICD-10-CM

## 2022-08-04 MED ORDER — POTASSIUM CHLORIDE CRYS ER 20 MEQ PO TBCR: 20.0000 meq | EXTENDED_RELEASE_TABLET | Freq: Every day | ORAL | 0 refills | Status: DC

## 2022-08-04 MED ORDER — FUROSEMIDE 40 MG PO TABS: 40.0000 mg | ORAL_TABLET | Freq: Every day | ORAL | 0 refills | Status: DC

## 2022-08-04 NOTE — Telephone Encounter (Signed)
Rx refill sent to pharmacy. 

## 2022-08-04 NOTE — Telephone Encounter (Signed)
Per phase I cardiac rehab, fax referral to [facility]. 

## 2022-08-04 NOTE — Progress Notes (Signed)
Ambulatory referral placed to [facility] per Dr. Weldner.  

## 2022-08-04 NOTE — Telephone Encounter (Signed)
Per phase I cardiac rehab, fax cardiac rehab referral to [facility]. 

## 2022-08-08 ENCOUNTER — Encounter: Payer: Self-pay | Admitting: Cardiology

## 2022-08-08 NOTE — Progress Notes (Signed)
 " Cardiology Office Note:    Date:  08/11/2022   ID:  Craig Lawson, DOB 08-27-1945, MRN [id number]  PCP:  Shlomo Darryle BROCKS, DO   [facility] Providers Cardiologist:  Redell Leiter, MD     Referring MD: Shlomo Darryle BROCKS, DO   CC: follow up CABG   History of Present Illness:    Craig Lawson is a 77 y.o. male with a hx of CAD (DES to PDA, DES to OM 2 in 2019 at [facility], PTCA of ISR of PDA and attempted unsuccessful angioplasty to LAD in 2021 with staged intervention with PTCA to D2, PTCA DES orbital arthrectomy to LAD), moderate to severe aortic stenosis s/p AVR 2024, postoperative atrial fibrillation, mild dilatation of the ascending aorta, LBBB, mild bilateral carotid artery disease, hypertension, hyperlipidemia intolerant of statins, morbid obese EF, OSA intolerant to CPAP, TIA's.  Admitted 07/03/2022 to 07/22/2022 after presenting with progressive angina x 1 week, described as chest burning and DOE along with lightheadedness.  Troponin 49> 114, BNP 141.  R/LHC on 07/04/2022 showed severe left main stenosis, severe aortic stenosis, not felt to be amenable to further PCI interventions.  CT surgery was consulted and he underwent CABG x 2 LIMA to LAD and R SVG from aorta to OM, and AVR on 07/11/2022.  Postoperative phase was complicated by postoperative atrial fibrillation--was not anticoagulated but treated with oral amiodarone , hyponatremia, volume overload, leukocytosis felt to be related to HCAP.  He developed shortness of breath on postop day 7, he underwent left thoracentesis with 600 mL fluid removed.  Repeat echo on 325 revealed an EF 55%, paradoxical motion of the interventricular septum, bioprosthetic valve was well-positioned with no signs of paravalvular regurgitation.  He was ultimately discharged home, requiring oxygen at bedtime, with recommendations to follow-up for management of OSA.  His HCTZ, olmesartan were stopped at discharge secondary to AKI.  He was evaluated by CT  surgery on 4/8 sternal precautions recommended for 5 more weeks.  He returned to their office on 4/15 and his driving restrictions were lifted.  Amiodarone  was stopped as he has had no recurrent of AF.  He presents today accompanied by his wife, states he is feeling okay, felt like he would feel better by now.  He is mainly bothered by fatigue, dizziness, and he was given good Dr. Suzette presents and at times shortness of breath with activity.  He is waiting to hear from cardiac rehab at [facility].  He had been walking however over the last few days with the change in weather he has not been.  He has some occasional chest pain with coughing, he is adhering to sternal precautions when he is coughing.  He endorses a productive cough that is getting better although he has stopped using his incentive spirometer. He denies chest pain, palpitations, dyspnea, pnd, orthopnea, n, v, dizziness, syncope, edema, weight gain, or early satiety.   Past Medical History:  Diagnosis Date   Aortic stenosis 08/10/2015   Bilateral carotid artery disease 06/2022   mild bilaterally   Bilateral leg edema 08/22/2015   Bradycardia 06/01/2017   Cellulitis of left lower extremity 04/29/2017   Coronary artery disease involving native coronary artery 10/22/2017   DOE (dyspnea on exertion) 05/04/2017   Essential hypertension 08/13/2015   Heart murmur 04/29/2017   Kissing, osteophytes 08/22/2015   LBBB (left bundle branch block) 08/22/2015   Lipid disorder 11/30/2017   Lumbar facet joint pain 08/22/2015   Medicare annual wellness visit,  subsequent 06/02/2016   Morbid obesity 10/22/2017   Murmur 04/29/2017   Myopathy 09/02/2019   Formatting of this note might be different from the original. Secondary to high freq statin use.   Need for vaccination for Strep pneumoniae 03/27/2017   Non-ST elevation myocardial infarction (NSTEMI), subendocardial infarction, subsequent episode of care 11/30/2017    Obesity, Class II, BMI 35-39.9 08/10/2015   OSA on CPAP 12/02/2017   Postoperative atrial fibrillation 07/11/2022   Pure hypercholesterolemia 05/24/2015   Seborrheic keratoses 03/27/2017   Overview:  Of the right forehead.   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   Venous insufficiency 08/10/2015    Past Surgical History:  Procedure Laterality Date   AORTIC VALVE REPLACEMENT N/A 07/11/2022   Procedure: AORTIC VALVE REPLACEMENT (AVR) USING INSPIRIS VALVE SIZE ;  Surgeon: Maryjane Mt, MD;  Location: [facility] OR;  Service: Open Heart Surgery;  Laterality: N/A;   CARDIAC CATHETERIZATION     CHOLECYSTECTOMY     CORONARY ANGIOPLASTY     CORONARY ARTERY BYPASS GRAFT N/A 07/11/2022   Procedure: CORONARY ARTERY BYPASS GRAFTING (CABG) X TWO, USING LEFT INTERNAL MAMMARY AND RIGHT LEG GREATER SAPHENOUS VEIN HARVESTED ENDOSCOPICALLY;  Surgeon: Maryjane Mt, MD;  Location: [facility] OR;  Service: Open Heart Surgery;  Laterality: N/A;   CORONARY ATHERECTOMY N/A 08/16/2019   Procedure: CORONARY ATHERECTOMY;  Surgeon: Dann Candyce RAMAN, MD;  Location: [facility] INVASIVE CV LAB;  Service: Cardiovascular;  Laterality: N/A;   CORONARY BALLOON ANGIOPLASTY N/A 07/28/2019   Procedure: CORONARY BALLOON ANGIOPLASTY;  Surgeon: Dann Candyce RAMAN, MD;  Location: [facility] INVASIVE CV LAB;  Service: Cardiovascular;  Laterality: N/A;   CORONARY ULTRASOUND/IVUS N/A 07/28/2019   Procedure: Intravascular Ultrasound/IVUS;  Surgeon: Dann Candyce RAMAN, MD;  Location: [facility] INVASIVE CV LAB;  Service: Cardiovascular;  Laterality: N/A;   CORONARY ULTRASOUND/IVUS N/A 08/16/2019   Procedure: Intravascular Ultrasound/IVUS;  Surgeon: Dann Candyce RAMAN, MD;  Location: [facility] INVASIVE CV LAB;  Service: Cardiovascular;  Laterality: N/A;   CORONARY ULTRASOUND/IVUS N/A 07/04/2022   Procedure: Intravascular Ultrasound/IVUS;  Surgeon: Verlin Lonni BIRCH,  MD;  Location: [facility] INVASIVE CV LAB;  Service: Cardiovascular;  Laterality: N/A;   ESOPHAGOGASTRODUODENOSCOPY     LEFT HEART CATH AND CORONARY ANGIOGRAPHY N/A 07/28/2019   Procedure: LEFT HEART CATH AND CORONARY ANGIOGRAPHY;  Surgeon: Dann Candyce RAMAN, MD;  Location: [facility] INVASIVE CV LAB;  Service: Cardiovascular;  Laterality: N/A;   LEFT HEART CATH AND CORONARY ANGIOGRAPHY N/A 08/16/2019   Procedure: LEFT HEART CATH AND CORONARY ANGIOGRAPHY;  Surgeon: Dann Candyce RAMAN, MD;  Location: [facility] INVASIVE CV LAB;  Service: Cardiovascular;  Laterality: N/A;   RIGHT/LEFT HEART CATH AND CORONARY ANGIOGRAPHY N/A 07/04/2022   Procedure: RIGHT/LEFT HEART CATH AND CORONARY ANGIOGRAPHY;  Surgeon: Verlin Lonni BIRCH, MD;  Location: [facility] INVASIVE CV LAB;  Service: Cardiovascular;  Laterality: N/A;   TEE WITHOUT CARDIOVERSION N/A 07/11/2022   Procedure: TRANSESOPHAGEAL ECHOCARDIOGRAM;  Surgeon: Maryjane Mt, MD;  Location: [facility] OR;  Service: Open Heart Surgery;  Laterality: N/A;    Current Medications: Current Meds  Medication Sig   aspirin  EC 81 MG tablet Take 81 mg by mouth at bedtime.   Cholecalciferol 125 MCG (5000 UT) TABS Take 1 tablet by mouth daily.   clopidogrel  (PLAVIX ) 75 MG tablet TAKE 1 TABLET BY MOUTH AT  BEDTIME   ezetimibe  (ZETIA ) 10 MG tablet Take 10 mg by mouth at bedtime.   ferrous sulfate  325 (65 FE)  MG tablet Take 1 tablet (325 mg total) by mouth daily with breakfast.   furosemide  (LASIX ) 40 MG tablet Take 1 tablet (40 mg total) by mouth daily.   gabapentin  (NEURONTIN ) 100 MG capsule Take 1 capsule (100 mg total) by mouth 2 (two) times daily.   metoprolol  tartrate (LOPRESSOR ) 25 MG tablet Take 1 tablet (25 mg total) by mouth 2 (two) times daily.   nitroGLYCERIN  (NITROSTAT ) 0.4 MG SL tablet Place 1 tablet (0.4 mg total) under the tongue every 5 (five) minutes as needed for chest pain.   potassium chloride  SA (KLOR-CON  M) 20 MEQ tablet Take 1 tablet (20 mEq total) by mouth daily.    rosuvastatin  (CRESTOR ) 20 MG tablet Take 1 tablet (20 mg total) by mouth daily.     Allergies:   Penicillins, Statins, Isosorbide, and Latex   Social History   Socioeconomic History   Marital status: Married    Spouse name: Not on file   Number of children: Not on file   Years of education: Not on file   Highest education level: Not on file  Occupational History   Not on file  Tobacco Use   Smoking status: Former    Types: Cigarettes    Quit date: 79    Years since quitting: 51.3    Passive exposure: Past   Smokeless tobacco: Never  Vaping Use   Vaping Use: Never used  Substance and Sexual Activity   Alcohol use: Yes    Alcohol/week: 7.0 standard drinks of alcohol    Types: 7 Glasses of wine per week    Comment: per week   Drug use: No   Sexual activity: Not on file  Other Topics Concern   Not on file  Social History Narrative   Not on file   Social Determinants of Health   Financial Resource Strain: Not on file  Food Insecurity: No Food Insecurity (07/04/2022)   Hunger Vital Sign    Worried About Running Out of Food in the Last Year: Never true    Ran Out of Food in the Last Year: Never true  Transportation Needs: No Transportation Needs (07/04/2022)   PRAPARE - Administrator, Civil Service (Medical): No    Lack of Transportation (Non-Medical): No  Physical Activity: Not on file  Stress: Not on file  Social Connections: Not on file     Family History: The patient's family history includes Cancer in his mother; Diabetes in his brother and sister; Hypertension in his brother.  ROS:   Please see the history of present illness.    All other systems reviewed and are negative.  EKGs/Labs/Other Studies Reviewed:    The following studies were reviewed today:  Cardiac Studies & Procedures   CARDIAC CATHETERIZATION  CARDIAC CATHETERIZATION 07/04/2022  Narrative   Dist RCA lesion is 50% stenosed.   2nd Diag lesion is 25% stenosed.   1st Diag  lesion is 75% stenosed.   RPDA lesion is 30% stenosed.   RPAV lesion is 60% stenosed.   Prox LAD to Mid LAD lesion is 20% stenosed.   Ost Cx to Prox Cx lesion is 90% stenosed.   Mid LM to Ost LAD lesion is 80% stenosed.   Ost LAD lesion is 80% stenosed.   Previously placed 1st Mrg stent of unknown type is  widely patent.  Severe distal left main stenosis (IVUS minimum lumen area of 4.0 mm2) Severe ostial LAD stenosis (IVUD minimum lumen area of 3.5 mm2). Patent mid LAD  stent. Severe ostial Circumflex stenosis. Patent obtuse marginal stent Large dominant RCA with moderate distal stenosis, patent PDA stent, moderate posterolateral artery stenosis. Severe aortic stenosis by echo (Cath data: mean gradient 32.8 mmHg, peak to peak gradient 42 mmHg)  Recommendations: Severe distal left main stenosis involving both the LAD and Circumflex, confirmed with IVUS imaging. Severe aortic stenosis. He will need a CT surgery consult for CABG and AVR. He has been on Plavix  so I will begin holding that tonight. Repeat echo tomorrow. He appears to be a good candidate for CABG but he is a [employer] Witness and refuses blood products. He will also need Plavix  washout before surgery. Resume IV heparin  8 hours post sheath pull. Will call CT surgery to see him tomorrow.  Findings Coronary Findings Diagnostic  Dominance: Right  Left Main Mid LM to Ost LAD lesion is 80% stenosed.  Left Anterior Descending Ost LAD lesion is 80% stenosed. Prox LAD to Mid LAD lesion is 20% stenosed. The lesion was previously treated using a drug eluting stent over 2 years ago.  First Diagonal Branch 1st Diag lesion is 75% stenosed.  Second Diagonal Branch 2nd Diag lesion is 25% stenosed. The lesion was previously treated .  Left Circumflex Ost Cx to Prox Cx lesion is 90% stenosed.  First Obtuse Marginal Branch Previously placed 1st Mrg stent of unknown type is  widely patent.  Right Coronary Artery Dist RCA lesion is 50%  stenosed.  Right Posterior Descending Artery RPDA lesion is 30% stenosed. The lesion was previously treated using a drug eluting stent over 2 years ago.  Right Posterior Atrioventricular Artery RPAV lesion is 60% stenosed.  Intervention  No interventions have been documented.   CARDIAC CATHETERIZATION  CARDIAC CATHETERIZATION 08/16/2019  Narrative  2nd Diag lesion is 80% stenosed.  Balloon angioplasty was performed using a BALLOON SAPPHIRE 2.0X12.  Post intervention, there is a 25% residual stenosis.  Prox LAD to Mid LAD lesion is 70% stenosed.  A drug-eluting stent was successfully placed using a SYNERGY XD 2.75X38, after orbital atherectomy, dilated to 3.25 mm. Stent size optimized with IVUS.  Post intervention, there is a 0% residual stenosis.  Ost Cx to Prox Cx lesion is 50% stenosed.  Dist RCA lesion is 50% stenosed.  RPDA stent that was treated a few weeks ago was widely patent.  1st Diag lesion is 75% stenosed.  LV end diastolic pressure is normal.  There is mild aortic valve stenosis. Mild gradient noted on pullback.  Continue aggressive secondary prevention.  Would recommend clopidogrel  monotherapy after 6 months of DAPT given his extensive disease.  Given the residual pain from the jailed diagonals, will watch overnight and treat with pain medication.  Findings Coronary Findings Diagnostic  Dominance: Right  Left Anterior Descending Prox LAD to Mid LAD lesion is 70% stenosed. The lesion was previously treated. IVUS would not cross  First Diagonal Branch 1st Diag lesion is 75% stenosed.  Second Diagonal Branch 2nd Diag lesion is 80% stenosed.  Left Circumflex Ost Cx to Prox Cx lesion is 50% stenosed.  Right Coronary Artery Dist RCA lesion is 50% stenosed.  Right Posterior Descending Artery Non-stenotic RPDA lesion. stent appeared underexpanded.  Vessel appeared to be 3.0 mm in diameter.  Intervention  Prox LAD to Mid LAD  lesion Atherectomy CATH LAUNCHER 6FR EBU 3.75 guide catheter was inserted. WIRE VIPERWIRE COR FLEX TIP guidewire was used to cross lesion. Orbital atherectomy was performed using a CROWN DIAMONDBACK CLASSIC 1.25. Stent CATH LAUNCHER 6FR EBU 3.75  guide catheter was inserted. Lesion crossed with guidewire using a WIRE ASAHI PROWATER 180CM. Pre-stent angioplasty was performed using a BALLOON SAPPHIRE 2.5X20. A drug-eluting stent was successfully placed using a SYNERGY XD 2.75X38. Stent strut is well apposed. Post-stent angioplasty was performed using a BALLOON SAPPHIRE [place] 3.25X15. Pre stent IVUS after athetectomy showed one focal area of concentric calcium  that was treated with the 2.5 balloon.   THere was difficulty delivering the stent requiring buddy wires.Post stent IVUS showed the stent was well apposed, but there was significant underlying calcium  that affected the shape of the stent. Post-Intervention Lesion Assessment The intervention was successful. Pre-interventional TIMI flow is 3. Post-intervention TIMI flow is 3. No complications occurred at this lesion. Ultrasound (IVUS) was performed on the lesion post PCI. Stent well apposed. There is a 0% residual stenosis post intervention.  2nd Diag lesion Angioplasty CATH LAUNCHER 6FR EBU 3.75 guide catheter was inserted. WIRE HI TORQ BMW 190CM guidewire used to cross lesion. Balloon angioplasty was performed using a BALLOON SAPPHIRE 2.0X12. Maximum pressure: 8 atm. Post-Intervention Lesion Assessment The intervention was successful. Pre-interventional TIMI flow is 3. Post-intervention TIMI flow is 3. No complications occurred at this lesion. There is a 25% residual stenosis post intervention.   STRESS TESTS  MYOCARDIAL PERFUSION IMAGING 12/07/2018  Narrative  The left ventricular ejection fraction is mildly decreased (45-54%).  Nuclear stress EF: 51%.  There was no ST segment deviation noted during stress.  The study is normal.  This is  a low risk study.   ECHOCARDIOGRAM  ECHOCARDIOGRAM LIMITED 07/14/2022  Narrative ECHOCARDIOGRAM LIMITED REPORT    Patient Name:   Craig Lawson Date of Exam: 07/14/2022 Medical Rec #:  [id number]     Height:       69.0 in Accession #:    [id number]    Weight:       268.7 lb Date of Birth:  09/09/1945      BSA:          2.342 m Patient Age:    77 years      BP:           102/57 mmHg Patient Gender: M             HR:           65 bpm. Exam Location:  Inpatient  Procedure: 2D Echo, Intracardiac Opacification Agent, Cardiac Doppler and Color Doppler  Indications:    Dyspnea  History:        Patient has prior history of Echocardiogram examinations, most recent 07/05/2022. Prior CABG and Prior Cardiac Surgery.  Sonographer:    Jeoffrey Chancy Referring Phys: [id number] LAURA P CLARK  IMPRESSIONS   1. Technically difficult study with very limited visualization of cardiac structures. 2. On contrasted views, LV function appears normal with an estimated EF of 55%. There is paradoxical motion of the interventricular septum due to underlying LBBB. 3. There is a well seated bioprosthetic valve in the aortic position with no signs of paravalvular regurgitation. 4. No pericardial effusion visualized. FINDINGS Left Ventricle: Left ventricular ejection fraction, by estimation, is 50 to 55%. The left ventricle has low normal function. Abnormal (paradoxical) septal motion consistent with post-operative status and abnormal (paradoxical) septal motion, consistent with left bundle branch block. Left ventricular diastolic parameters are consistent with Grade II diastolic dysfunction (pseudonormalization). Left ventricular diastolic function could not be evaluated due to indeterminate diastolic function.  Right Ventricle: The right ventricular size is normal. No increase in right ventricular wall  thickness. Right ventricular systolic function is low normal.  Left Atrium: Left atrial size was not well  visualized.  Right Atrium: Right atrial size was not well visualized.  Pericardium: There is no evidence of pericardial effusion.  Mitral Valve: The mitral valve was not well visualized.  Tricuspid Valve: The tricuspid valve is not well visualized.  Aortic Valve: The aortic valve has been repaired/replaced. Aortic valve mean gradient measures 11.0 mmHg. Aortic valve peak gradient measures 23.0 mmHg. Aortic valve area, by VTI measures 2.62 cm. There is a bioprosthetic valve present in the aortic position.  Pulmonic Valve: The pulmonic valve was not assessed.  Venous: The inferior vena cava was not well visualized.  LEFT VENTRICLE PLAX 2D LVIDd:         5.30 cm   Diastology LVIDs:         4.30 cm   LV e' medial:    3.26 cm/s LV PW:         1.10 cm   LV E/e' medial:  27.9 LV IVS:        1.10 cm   LV e' lateral:   5.00 cm/s LVOT diam:     2.40 cm   LV E/e' lateral: 18.2 LV SV:         112 LV SV Index:   48 LVOT Area:     4.52 cm   RIGHT VENTRICLE RV S prime:     7.62 cm/s TAPSE (M-mode): 1.0 cm  LEFT ATRIUM           Index        RIGHT ATRIUM           Index LA Vol (A4C): 43.0 ml 18.36 ml/m  RA Area:     14.60 cm RA Volume:   31.50 ml  13.45 ml/m AORTIC VALVE AV Area (Vmax):    2.70 cm AV Area (Vmean):   2.91 cm AV Area (VTI):     2.62 cm AV Vmax:           240.00 cm/s AV Vmean:          150.000 cm/s AV VTI:            0.427 m AV Peak Grad:      23.0 mmHg AV Mean Grad:      11.0 mmHg LVOT Vmax:         143.00 cm/s LVOT Vmean:        96.400 cm/s LVOT VTI:          0.247 m LVOT/AV VTI ratio: 0.58  AORTA Ao Root diam: 3.50 cm Ao Asc diam:  3.40 cm  MITRAL VALVE MV Area (PHT): 4.60 cm    SHUNTS MV Decel Time: 165 msec    Systemic VTI:  0.25 m MV E velocity: 90.80 cm/s  Systemic Diam: 2.40 cm MV A velocity: 83.40 cm/s MV E/A ratio:  1.09  Aditya Sabharwal Electronically signed by Ria Commander Signature Date/Time: 07/14/2022/6:00:23 PM    Final    TEE  ECHO INTRAOPERATIVE TEE 07/20/2022  Narrative *INTRAOPERATIVE TRANSESOPHAGEAL REPORT *    Patient Name:   Craig Lawson Date of Exam: 07/11/2022 Medical Rec #:  [id number]     Height:       69.0 in Accession #:    [id number]    Weight:       249.6 lb Date of Birth:  05/03/1945      BSA:          2.27  m Patient Age:    77 years      BP:           134/69 mmHg Patient Gender: M             HR:           59 bpm. Exam Location:  Inpatient  Transesophogeal exam was perform intraoperatively during surgical procedure. Patient was closely monitored under general anesthesia during the entirety of examination.  Indications:     I25.10 CAD Performing Phys: [id number] DEWARD KALLMAN  Complications: No known complications during this procedure. POST-OP IMPRESSIONS _ Left Ventricle: has mildly reduced systolic function, with an ejection fraction of 50%. The wall motion is septal dyssynchrony from pacing, inferior wall mildly hypokinetic relative to anterior wall. _ Right Ventricle: normal function. The cavity was normal. The wall motion is normal. _ Aorta: there is no dissection present in the aorta. 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 The gradient recorded across the prosthetic valve is within the expected range, measuring 4 mmHg. _ Mitral Valve: The mitral valve appears unchanged from pre-bypass. _ Tricuspid Valve: Mild regurgitation post repair.  PRE-OP FINDINGS Left Ventricle: The left ventricle has low normal systolic function, with an ejection fraction of 50-55%. The cavity size was normal. No evidence of left ventricular regional wall motion abnormalities. There is moderate concentric left ventricular hypertrophy.   Right Ventricle: The right ventricle has normal systolic function. The cavity was normal. There is no increase in right ventricular wall thickness.  Left Atrium: Left atrial size was not assessed. No  left atrial/left atrial appendage thrombus was detected. Left atrial appendage velocity is normal at greater than 40 cm/s.  Right Atrium: Right atrial size was not assessed.  Interatrial Septum: No atrial level shunt detected by color flow Doppler. There is no evidence of a patent foramen ovale.  Pericardium: There is no evidence of pericardial effusion.  Mitral Valve: The mitral valve is normal in structure. Mitral valve regurgitation is mild by color flow Doppler. The MR jet is centrally-directed. There is No evidence of mitral stenosis.  Tricuspid Valve: The tricuspid valve was normal in structure. Tricuspid valve regurgitation was not visualized by color flow Doppler. No evidence of tricuspid stenosis is present.  Aortic Valve: The aortic valve is tricuspid Aortic valve regurgitation is mild by color flow Doppler. The jet is posteriorly-directed. There is moderate stenosis of the aortic valve, with a calculated valve area of 1.38 cm. There is severe thickening and severe calcifcation present on the aortic valve left coronary and non-coronary cusps with severely decreased mobility and there is moderate thickening and mild calcification present on the aortic valve right coronary cusp with mildly decreased mobility.  Pulmonic Valve: The pulmonic valve was normal in structure, with normal. No evidence of pumonic stenosis. Pulmonic valve regurgitation is not visualized by color flow Doppler.   Aorta: There is evidence of plaque in the descending aorta; Grade I, measuring 1-75mm in size. There is evidence of a dissection in the none.  Shunts: There is no evidence of an atrial septal defect.  +--------------+--------++ LEFT VENTRICLE         +--------------+--------++ PLAX 2D                +--------------+--------++ LVOT diam:    2.20 cm  +--------------+--------++ LVOT Area:    3.80 cm +--------------+--------++                         +--------------+--------++  +------------------+------------++  AORTIC VALVE                   +------------------+------------++ AV Area (Vmax):   1.55 cm     +------------------+------------++ AV Area (Vmean):  1.44 cm     +------------------+------------++ AV Area (VTI):    1.38 cm     +------------------+------------++ AV Vmax:          286.33 cm/s  +------------------+------------++ AV Vmean:         198.967 cm/s +------------------+------------++ AV VTI:           0.610 m      +------------------+------------++ AV Peak Grad:     32.8 mmHg    +------------------+------------++ AV Mean Grad:     20.7 mmHg    +------------------+------------++ LVOT Vmax:        116.85 cm/s  +------------------+------------++ LVOT Vmean:       75.550 cm/s  +------------------+------------++ LVOT VTI:         0.221 m      +------------------+------------++ LVOT/AV VTI ratio:0.36         +------------------+------------++  +-------------+---------++ MITRAL VALVE           +--------------+-------+ +-------------+---------++ SHUNTS                MV Peak grad:4.1 mmHg  +--------------+-------+ +-------------+---------++ Systemic VTI: 0.22 m  MV Mean grad:1.0 mmHg  +--------------+-------+ +-------------+---------++ Systemic Diam:2.20 cm MV Vmax:     1.01 m/s  +--------------+-------+ +-------------+---------++ MV Vmean:    45.1 cm/s +-------------+---------++ MV VTI:      0.25 m    +-------------+---------++   Lonni Custard MD Electronically signed by Lonni Custard MD Signature Date/Time: 07/20/2022/8:52:48 AM    Final   MONITORS  LONG TERM MONITOR (3-14 DAYS) 10/25/2018  Narrative A ZIO monitor was applied for 3 days to assess palpitation following MI beginning 10/12/2018.  Rhythm throughout was sinus with bundle branch block minimum average and maximum heart rates of 41, 55 and  125 bpm.  There were no pauses of 3 seconds or greater and no episodes of sinus node or AV nodal block.  There were 3 triggered and 3 diary events predominantly associated with PVCs.  Ventricular ectopy was rare with isolated PVCs.  Supraventricular ectopy was rare there were no episodes of atrial fibrillation or flutter.  There were 3 brief episodes of runs of atrial premature contractions the longest 6 complexes at a rate of 113 bpm.   Conclusion symptomatic PVCs, however PVC burden is low and the occurrence of palpitation is infrequent            EKG:  EKG is ordered today.  The ekg ordered today demonstrates NSR, LBBB, EKG 64 bpm, consistent with prior EKG tracings.   Recent Labs: 07/04/2022: ALT 37; TSH 4.966 07/13/2022: B Natriuretic Peptide 235.1 07/16/2022: Magnesium  2.3 07/20/2022: BUN 30; Creatinine, Ser 1.15; Hemoglobin 9.8; Platelets 251; Potassium 4.4; Sodium 133  Recent Lipid Panel    Component Value Date/Time   CHOL 184 07/04/2022 0258   CHOL 189 07/05/2019 0853   TRIG 196 (H) 07/04/2022 0258   HDL 37 (L) 07/04/2022 0258   HDL 43 07/05/2019 0853   CHOLHDL 5.0 07/04/2022 0258   VLDL 39 07/04/2022 0258   LDLCALC 108 (H) 07/04/2022 0258   LDLCALC 118 (H) 07/05/2019 0853     Risk Assessment/Calculations:    CHA2DS2-VASc Score = 6   This indicates a 9.7% annual risk of stroke. The patient's score is based upon: CHF History: 0 HTN History:  1 Diabetes History: 0 Stroke History: 2 Vascular Disease History: 1 Age Score: 2 Gender Score: 0               Physical Exam:    VS:  BP 130/74 (BP Location: Left Arm, Patient Position: Sitting, Cuff Size: Normal)   Pulse 64   Ht 5' 9 (1.753 m)   Wt 246 lb (111.6 kg)   SpO2 96%   BMI 36.33 kg/m     Wt Readings from Last 3 Encounters:  08/11/22 246 lb (111.6 kg)  08/04/22 250 lb (113.4 kg)  07/30/22 256 lb 12.8 oz (116.5 kg)     GEN:  Well nourished, well developed in no acute distress HEENT:  Normal NECK: No JVD; No carotid bruits LYMPHATICS: No lymphadenopathy CARDIAC: RRR, no murmurs, rubs, gallops RESPIRATORY:  Clear to auscultation without rales, wheezing or rhonchi  ABDOMEN: Soft, non-tender, non-distended MUSCULOSKELETAL:  +1 pitting edema; No deformity  SKIN: Warm and dry, sternotomy/CT/harvest site healing well.  NEUROLOGIC:  Alert and oriented x 3 PSYCHIATRIC:  Normal affect   ASSESSMENT:    1. Coronary artery disease of native artery of native heart with stable angina pectoris   2. Aortic valve stenosis, etiology of cardiac valve disease unspecified   3. Mixed hyperlipidemia   4. Essential hypertension   5. LBBB (left bundle branch block)   6. Bilateral carotid artery disease, unspecified type   7. Paroxysmal atrial fibrillation    PLAN:    In order of problems listed above:  Coronary artery disease- s/p CABG CABG x 2 LIMA to LAD and R SVG from aorta to OM. Stable with no anginal symptoms. No indication for ischemic evaluation.  Continue aspirin  81 mg daily, continue Plavix  75 mg daily, continue Zetia  10 mg daily, continue rosuvastatin  20 mg daily, continue metoprolol  25 mg twice daily.  S/p AVR- some DOE with exertion, but sounds to be most related to deconditioning. Waiting to hear from cardiac rehab. Recent CXR at CT surgery visit revealed small bilateral pleural effusions. Encouraged IS use.   Hypertension-blood pressure today is marginally controlled at 130/74 however his olmesartan and HCTZ were stopped at discharge secondary to AKI and hypotension.  In light of ongoing dizziness will not restart these at this time.  Instructed him and his wife if his blood pressure starts to get higher, contact us  and we would likely restart his olmesartan.  Hyperlipidemia-LDL elevated at 108 on 07/04/2022, continue Zetia  10 mg daily, continue rosuvastatin  20 mg daily. Discussed indications to increase his statin, however he declines at this time, states his PCP typically  manages this for him. Discussed PCSK9i as he is largely intolerant of statins secondary to myalgias and only taking his statin twice/week. He would like to not add any more medications at this time as he is still feeling somewhat poorly.   Carotid artery disease-mild bilaterally on carotid ultrasound in March 2024. Repeat imaging in 1 year.   Paroxysmal atrial fibrillation- directly in the post operative setting, denies palpitations.  Amiodarone  was stopped by cardiothoracic surgeon earlier this month.  CHA2DS2-VASc score 6, if recurrence, will need to consider with DOAC therapy.     Disposition-CBC, BMET, TSH, BNP. Return in 2 months.        Medication Adjustments/Labs and Tests Ordered: Current medicines are reviewed at length with the patient today.  Concerns regarding medicines are outlined above.  Orders Placed This Encounter  Procedures   Comprehensive metabolic panel   Pro b natriuretic peptide (BNP)  TSH   CBC with Differential/Platelet   EKG 12-Lead   Meds ordered this encounter  Medications   nitroGLYCERIN  (NITROSTAT ) 0.4 MG SL tablet    Sig: Place 1 tablet (0.4 mg total) under the tongue every 5 (five) minutes as needed for chest pain.    Dispense:  25 tablet    Refill:  3    Patient Instructions  Medication Instructions:  Your physician recommends that you continue on your current medications as directed. Please refer to the Current Medication list given to you today.  *If you need a refill on your cardiac medications before your next appointment, please call your pharmacy*   Lab Work: Your physician recommends that you return for lab work in: Today for a CMP, BNP, TSH, CBC  If you have labs (blood work) drawn today and your tests are completely normal, you will receive your results only by: MyChart Message (if you have MyChart) OR A paper copy in the mail If you have any lab test that is abnormal or we need to change your treatment, we will call you to review  the results.   Testing/Procedures: NONE   Follow-Up: At [facility], you and your health needs are our priority.  As part of our continuing mission to provide you with exceptional heart care, we have created designated Provider Care Teams.  These Care Teams include your primary Cardiologist (physician) and Advanced Practice Providers (APPs -  Physician Assistants and Nurse Practitioners) who all work together to provide you with the care you need, when you need it.  We recommend signing up for the patient portal called MyChart.  Sign up information is provided on this After Visit Summary.  MyChart is used to connect with patients for Virtual Visits (Telemedicine).  Patients are able to view lab/test results, encounter notes, upcoming appointments, etc.  Non-urgent messages can be sent to your provider as well.   To learn more about what you can do with MyChart, go to [email or web address].    Your next appointment:   2 month(s)  Provider:   Redell Leiter, MD    Other Instructions     Signed, Delon JAYSON Hoover, NP  08/11/2022 9:56 AM    [facility] "

## 2022-08-11 ENCOUNTER — Encounter: Payer: Self-pay | Admitting: Cardiology

## 2022-08-11 ENCOUNTER — Ambulatory Visit: Payer: Medicare Other | Attending: Cardiology | Admitting: Cardiology

## 2022-08-11 VITALS — BP 130/74 | HR 64 | Ht 69.0 in | Wt 246.0 lb

## 2022-08-11 DIAGNOSIS — I447 Left bundle-branch block, unspecified: Secondary | ICD-10-CM

## 2022-08-11 DIAGNOSIS — I480 Paroxysmal atrial fibrillation: Secondary | ICD-10-CM

## 2022-08-11 DIAGNOSIS — E782 Mixed hyperlipidemia: Secondary | ICD-10-CM | POA: Diagnosis not present

## 2022-08-11 DIAGNOSIS — I350 Nonrheumatic aortic (valve) stenosis: Secondary | ICD-10-CM | POA: Diagnosis not present

## 2022-08-11 DIAGNOSIS — I10 Essential (primary) hypertension: Secondary | ICD-10-CM

## 2022-08-11 DIAGNOSIS — I25118 Atherosclerotic heart disease of native coronary artery with other forms of angina pectoris: Secondary | ICD-10-CM | POA: Diagnosis not present

## 2022-08-11 DIAGNOSIS — I779 Disorder of arteries and arterioles, unspecified: Secondary | ICD-10-CM

## 2022-08-11 MED ORDER — NITROGLYCERIN 0.4 MG SL SUBL: 0.4000 mg | SUBLINGUAL_TABLET | SUBLINGUAL | 3 refills | Status: AC | PRN

## 2022-08-11 NOTE — Patient Instructions (Signed)
Medication Instructions:  Your physician recommends that you continue on your current medications as directed. Please refer to the Current Medication list given to you today.  *If you need a refill on your cardiac medications before your next appointment, please call your pharmacy*   Lab Work: Your physician recommends that you return for lab work in: Today for a CMP, BNP, TSH, CBC  If you have labs (blood work) drawn today and your tests are completely normal, you will receive your results only by: MyChart Message (if you have MyChart) OR A paper copy in the mail If you have any lab test that is abnormal or we need to change your treatment, we will call you to review the results.   Testing/Procedures: NONE   Follow-Up: At [facility], you and your health needs are our priority.  As part of our continuing mission to provide you with exceptional heart care, we have created designated Provider Care Teams.  These Care Teams include your primary Cardiologist (physician) and Advanced Practice Providers (APPs -  Physician Assistants and Nurse Practitioners) who all work together to provide you with the care you need, when you need it.  We recommend signing up for the patient portal called "MyChart".  Sign up information is provided on this After Visit Summary.  MyChart is used to connect with patients for Virtual Visits (Telemedicine).  Patients are able to view lab/test results, encounter notes, upcoming appointments, etc.  Non-urgent messages can be sent to your provider as well.   To learn more about what you can do with MyChart, go to [email or web address].    Your next appointment:   2 month(s)  Provider:   Norman Herrlich, MD    Other Instructions

## 2022-08-12 ENCOUNTER — Telehealth: Payer: Self-pay

## 2022-08-12 DIAGNOSIS — I10 Essential (primary) hypertension: Secondary | ICD-10-CM

## 2022-08-12 LAB — CBC WITH DIFFERENTIAL/PLATELET
Basophils Absolute: 0 x10E3/uL (ref 0.0–0.2)
Basos: 1 %
EOS (ABSOLUTE): 0.4 x10E3/uL (ref 0.0–0.4)
Eos: 6 %
Hematocrit: 39.4 % (ref 37.5–51.0)
Hemoglobin: 12.8 g/dL — ABNORMAL LOW (ref 13.0–17.7)
Immature Grans (Abs): 0 x10E3/uL (ref 0.0–0.1)
Immature Granulocytes: 0 %
Lymphocytes Absolute: 1.4 x10E3/uL (ref 0.7–3.1)
Lymphs: 20 %
MCH: 28.3 pg (ref 26.6–33.0)
MCHC: 32.5 g/dL (ref 31.5–35.7)
MCV: 87 fL (ref 79–97)
Monocytes Absolute: 0.5 x10E3/uL (ref 0.1–0.9)
Monocytes: 8 %
Neutrophils Absolute: 4.6 x10E3/uL (ref 1.4–7.0)
Neutrophils: 65 %
Platelets: 224 x10E3/uL (ref 150–450)
RBC: 4.52 x10E6/uL (ref 4.14–5.80)
RDW: 14.7 % (ref 11.6–15.4)
WBC: 7 x10E3/uL (ref 3.4–10.8)

## 2022-08-12 LAB — COMPREHENSIVE METABOLIC PANEL WITH GFR
ALT: 16 IU/L (ref 0–44)
AST: 13 IU/L (ref 0–40)
Albumin/Globulin Ratio: 1.3 (ref 1.2–2.2)
Albumin: 3.8 g/dL (ref 3.8–4.8)
Alkaline Phosphatase: 46 IU/L (ref 44–121)
BUN/Creatinine Ratio: 16 (ref 10–24)
BUN: 17 mg/dL (ref 8–27)
Bilirubin Total: 0.6 mg/dL (ref 0.0–1.2)
CO2: 23 mmol/L (ref 20–29)
Calcium: 9.3 mg/dL (ref 8.6–10.2)
Chloride: 100 mmol/L (ref 96–106)
Creatinine, Ser: 1.04 mg/dL (ref 0.76–1.27)
Globulin, Total: 2.9 g/dL (ref 1.5–4.5)
Glucose: 102 mg/dL — ABNORMAL HIGH (ref 70–99)
Potassium: 4.3 mmol/L (ref 3.5–5.2)
Sodium: 143 mmol/L (ref 134–144)
Total Protein: 6.7 g/dL (ref 6.0–8.5)
eGFR: 73 mL/min/1.73

## 2022-08-12 LAB — PRO B NATRIURETIC PEPTIDE: NT-Pro BNP: 663 pg/mL — ABNORMAL HIGH (ref 0–486)

## 2022-08-12 LAB — TSH: TSH: 6.89 u[IU]/mL — ABNORMAL HIGH (ref 0.450–4.500)

## 2022-08-12 NOTE — Telephone Encounter (Signed)
Results reviewed with pt as per Jimmy Footman, NP note.  Pt verbalized understanding and had no additional questions. Routed to PCP.

## 2022-08-12 NOTE — Telephone Encounter (Signed)
-----   Message from Flossie Dibble, NP sent at 08/12/2022  8:46 AM EDT ----- Mr. Weller, Your chemistry panel looked good. Good kidney function! Your CBC looked better as well, hemoglobin at dc was 9.8, now it is 12.8.  Your thyroid hormone was slightly elevated, which may mean your thyroid is underactive--which could add to fatigue, it could also be up d/t the amiodarone you were on. I would follow up with your PCP about this.  I checked one other lab value that indicates if you are holding on to too much fluid and it was elevated. I want you to take another 1/2 tablet of your lasix for the next three mornings to see if that helps some. I will need to repeat a BMET on you next week (to check kidney function). Best, Victorino Dike

## 2022-08-19 ENCOUNTER — Ambulatory Visit (HOSPITAL_BASED_OUTPATIENT_CLINIC_OR_DEPARTMENT_OTHER): Payer: Medicare Other | Attending: Nurse Practitioner | Admitting: Pulmonary Disease

## 2022-08-19 VITALS — Ht 69.0 in | Wt 245.0 lb

## 2022-08-19 DIAGNOSIS — R0683 Snoring: Secondary | ICD-10-CM

## 2022-08-19 DIAGNOSIS — G4733 Obstructive sleep apnea (adult) (pediatric): Secondary | ICD-10-CM

## 2022-08-19 DIAGNOSIS — G4734 Idiopathic sleep related nonobstructive alveolar hypoventilation: Secondary | ICD-10-CM | POA: Insufficient documentation

## 2022-08-19 DIAGNOSIS — R0681 Apnea, not elsewhere classified: Secondary | ICD-10-CM

## 2022-08-19 HISTORY — DX: Idiopathic sleep related nonobstructive alveolar hypoventilation: G47.34

## 2022-08-20 DIAGNOSIS — G4734 Idiopathic sleep related nonobstructive alveolar hypoventilation: Secondary | ICD-10-CM | POA: Diagnosis not present

## 2022-08-20 NOTE — Procedures (Signed)
Patient Name: Craig Lawson, Craig Lawson Date: 08/19/2022 Gender: Male D.O.B: Feb 25, 1946 Age (years): 32 Referring Provider: Noemi Chapel NP Height (inches): 69 Interpreting Physician: Coralyn Helling MD, ABSM Weight (lbs): 245 RPSGT: Lowry Ram BMI: 36 MRN: [id number] Neck Size: 18.00  CLINICAL INFORMATION Sleep Study Type: Split Night CPAP  Indication for sleep study: Hypertension, Obesity, Re-Evaluation, Snoring  Epworth Sleepiness Score: 5  SLEEP STUDY TECHNIQUE As per the AASM Manual for the Scoring of Sleep and Associated Events v2.3 (April 2016) with a hypopnea requiring 4% desaturations.  The channels recorded and monitored were frontal, central and occipital EEG, electrooculogram (EOG), submentalis EMG (chin), nasal and oral airflow, thoracic and abdominal wall motion, anterior tibialis EMG, snore microphone, electrocardiogram, and pulse oximetry. Continuous positive airway pressure (CPAP) was initiated when the patient met split night criteria and was titrated according to treat sleep-disordered breathing.  MEDICATIONS Medications self-administered by patient taken the night of the study : ASPIRIN, CLOPIDOGREL, EZETIMIBE, GABAPENTIN, METOPROLOL TARTRATE  RESPIRATORY PARAMETERS Diagnostic  Total AHI (/hr): 36.6 RDI (/hr): 37.9 OA Index (/hr): - CA Index (/hr): 0.0 REM AHI (/hr): 54.0 NREM AHI (/hr): 35.2 Supine AHI (/hr): N/A Non-supine AHI (/hr): 36.6 Min O2 Sat (%): 60.0 Mean O2 (%): 87.5 Time below 88% (min): 68.6  Titration  Optimal Pressure (cm): 14 AHI at Optimal Pressure (/hr): 14.3 Min O2 at Optimal Pressure (%): 76.0 Supine % at Optimal (%): 0 Sleep % at Optimal (%): 100   SLEEP ARCHITECTURE The recording time for the entire night was 411 minutes.  During a baseline period of 144.9 minutes, the patient slept for 134.4 minutes in REM and nonREM, yielding a sleep efficiency of 92.8%. Sleep onset after lights out was 4.0 minutes with a REM latency of  105.5 minutes. The patient spent 3.3% of the night in stage N1 sleep, 89.2% in stage N2 sleep, 0.0% in stage N3 and 7.4% in REM.  During the titration period of 259.1 minutes, the patient slept for 255.6 minutes in REM and nonREM, yielding a sleep efficiency of 98.6%. Sleep onset after CPAP initiation was 0.0 minutes with a REM latency of 53.0 minutes. The patient spent 1.6% of the night in stage N1 sleep, 80.6% in stage N2 sleep, 0.0% in stage N3 and 17.8% in REM.  CARDIAC DATA The 2 lead EKG demonstrated sinus rhythm. The mean heart rate was 100.0 beats per minute. Other EKG findings include: PVCs.  LEG MOVEMENT DATA The total Periodic Limb Movements of Sleep (PLMS) were 0. The PLMS index was 0.0 .  IMPRESSIONS - Severe obstructive sleep apnea with an AHI of 36.6 and SpO2 low of 60%. - Suboptimal titration portion of the study.  She was tried on CPAP up to 14 cm H2O, but continued to have obstructive events especially during REM sleep. - The patient snored with moderate snoring volume during the diagnostic portion of the study.  DIAGNOSIS - Obstructive Sleep Apnea (G47.33) - Nocturnal Hypoxemia (G47.36)  RECOMMENDATIONS - She should undergo a full night CPAP titration study starting at CPAP 12 cm H2O. - Alternative therapies include oral appliance, or surgical assessment. - Avoid alcohol, sedatives and other CNS depressants that may worsen sleep apnea and disrupt normal sleep architecture. - Sleep hygiene should be reviewed to assess factors that may improve sleep quality. - Weight management and regular exercise should be initiated or continued.  [Electronically signed] 08/20/2022 09:28 AM  Coralyn Helling MD, ABSM Diplomate, American Board of Sleep Medicine NPI: [id number]  [place]  [facility] PH: [phone]   FX: [phone] ACCREDITED BY THE AMERICAN ACADEMY OF SLEEP MEDICINE

## 2022-08-22 ENCOUNTER — Ambulatory Visit: Payer: Medicare Other | Attending: Cardiology

## 2022-08-22 DIAGNOSIS — I350 Nonrheumatic aortic (valve) stenosis: Secondary | ICD-10-CM

## 2022-08-22 LAB — ECHOCARDIOGRAM COMPLETE
AR max vel: 1.89 cm2
AV Area VTI: 1.83 cm2
AV Area mean vel: 1.83 cm2
AV Mean grad: 6.8 mmHg
AV Peak grad: 12.9 mmHg
Ao pk vel: 1.8 m/s
Area-P 1/2: 2.96 cm2
Calc EF: 51 %
S' Lateral: 4.8 cm
Single Plane A2C EF: 52.9 %
Single Plane A4C EF: 46.7 %

## 2022-08-25 ENCOUNTER — Telehealth: Payer: Self-pay | Admitting: Nurse Practitioner

## 2022-08-25 DIAGNOSIS — G4733 Obstructive sleep apnea (adult) (pediatric): Secondary | ICD-10-CM

## 2022-08-28 NOTE — Telephone Encounter (Signed)
Called the pt and there was no answer- LMTCB    

## 2022-08-29 NOTE — Telephone Encounter (Signed)
Pt returned call & can be reached at [phone].

## 2022-08-29 NOTE — Telephone Encounter (Signed)
Called patient at the number provided. Received a message that the person was unavailable and I could not leave a message.

## 2022-09-04 NOTE — Telephone Encounter (Signed)
Called the pt and there was no answer and no option to leave msg. Will send mychart msg.

## 2022-09-10 ENCOUNTER — Ambulatory Visit (HOSPITAL_BASED_OUTPATIENT_CLINIC_OR_DEPARTMENT_OTHER): Payer: Medicare Other | Attending: Nurse Practitioner | Admitting: Pulmonary Disease

## 2022-09-10 DIAGNOSIS — G4733 Obstructive sleep apnea (adult) (pediatric): Secondary | ICD-10-CM | POA: Insufficient documentation

## 2022-09-11 DIAGNOSIS — G4733 Obstructive sleep apnea (adult) (pediatric): Secondary | ICD-10-CM

## 2022-09-11 NOTE — Procedures (Signed)
      Patient Name: Craig Lawson, Craig Lawson Date: 09/10/2022 Gender: Male D.O.B: 1945/11/27 Age (years): 29 Referring Provider: Noemi Chapel NP Height (inches): 69 Interpreting Physician: Coralyn Helling MD, ABSM Weight (lbs): 245 RPSGT: Lowry Ram BMI: 36 MRN: [id number] Neck Size: 18.00  CLINICAL INFORMATION The patient is referred for a BiPAP titration to treat sleep apnea.  Date of Split Night 08/19/22: AHI 36.6, SpO2 low 60%.  SLEEP STUDY TECHNIQUE As per the AASM Manual for the Scoring of Sleep and Associated Events v2.3 (April 2016) with a hypopnea requiring 4% desaturations.  The channels recorded and monitored were frontal, central and occipital EEG, electrooculogram (EOG), submentalis EMG (chin), nasal and oral airflow, thoracic and abdominal wall motion, anterior tibialis EMG, snore microphone, electrocardiogram, and pulse oximetry. Bilevel positive airway pressure (BPAP) was initiated at the beginning of the study and titrated to treat sleep-disordered breathing.  MEDICATIONS Medications self-administered by patient taken the night of the study : ASPIRIN, CLOPIDOGREL, EZETIMIBE, GABAPENTIN, METOPROLOL TARTRATE  RESPIRATORY PARAMETERS Optimal IPAP Pressure (cm): 21 AHI at Optimal Pressure (/hr) 4.9 Optimal EPAP Pressure (cm): 17   Overall Minimal O2 (%): 77.0 Minimal O2 at Optimal Pressure (%): 85.0  He was tried on CPAP, but continued to have obstructive respiratory events.  These improved once transitioned to Bipap 21/17 cm H2O.  SLEEP ARCHITECTURE Start Time: 10:14:51 PM Stop Time: 5:02:56 AM Total Time (min): 408.1 Total Sleep Time (min): 377 Sleep Latency (min): 1.2 Sleep Efficiency (%): 92.4% REM Latency (min): 77.5 WASO (min): 29.8 Stage N1 (%): 3.2% Stage N2 (%): 79.6% Stage N3 (%): 0.0% Stage R (%): 17.2 Supine (%): 18.70 Arousal Index (/hr): 6.4   CARDIAC DATA The 2 lead EKG demonstrated sinus rhythm. The mean heart rate was 54.3 beats per minute.  Other EKG findings include: PVCs.  LEG MOVEMENT DATA The total Periodic Limb Movements of Sleep (PLMS) were 0. The PLMS index was 0.0. A PLMS index of <15 is considered normal in adults.  IMPRESSIONS - He did best with Bipap 21/17 cm H2O. - He did not require supplemental oxygen during this study.  DIAGNOSIS - Obstructive Sleep Apnea (G47.33)  RECOMMENDATIONS - Trial of BiPAP therapy on 21/17 cm H2O with a Large size [employer] Full Face Evora Full mask and heated humidification. - Avoid alcohol, sedatives and other CNS depressants that may worsen sleep apnea and disrupt normal sleep architecture. - Sleep hygiene should be reviewed to assess factors that may improve sleep quality. - Weight management and regular exercise should be initiated or continued.  [Electronically signed] 09/11/2022 01:12 PM  Coralyn Helling MD, ABSM Diplomate, American Board of Sleep Medicine NPI: [id number]   [facility] PH: [phone]   FX: [phone] ACCREDITED BY THE AMERICAN ACADEMY OF SLEEP MEDICINE

## 2022-09-17 ENCOUNTER — Telehealth: Payer: Self-pay | Admitting: Student

## 2022-09-17 NOTE — Telephone Encounter (Signed)
PT calling for FU appt. Per NP I am to put in as a New PT w/Dr. Val Eagle. He has nothing avail. May I put in for [facility] (Sleep [facility])? If so, how when no "New PT slots" populate?  Please advise me and I will call PT. He is in [place].  (Mondays, Wed Thurs are for his therapy bet 1:00 & 3:00)

## 2022-09-18 NOTE — Telephone Encounter (Signed)
Spoke with the pt and notified of results/recs per Florentina Addison  Nothing further needed

## 2022-09-19 NOTE — Telephone Encounter (Signed)
I called the pt to inform pt Dr.O schedule has not yet been released and I've placed a recall. Would you like me to place pt on your schedule or is the recall okay ? Katie please advise and send back to me

## 2022-09-22 NOTE — Telephone Encounter (Signed)
Ok to schedule him with me to ensure PAP therapy is going well and then we will get him in with one of the sleep docs at a later time.

## 2022-09-24 NOTE — Telephone Encounter (Signed)
OK- I made an appointment for July 17th. I know you wanted to see him sooner but he has rehab on Mon. Wed. And Thurs. He is out of town for your first avail Fri appt.  Thank you for your replies. NFN.

## 2022-10-22 ENCOUNTER — Encounter: Payer: Self-pay | Admitting: Cardiology

## 2022-10-22 ENCOUNTER — Ambulatory Visit: Payer: Medicare Other | Attending: Cardiology | Admitting: Cardiology

## 2022-10-22 VITALS — BP 124/82 | HR 52 | Ht 69.0 in | Wt 250.6 lb

## 2022-10-22 DIAGNOSIS — I480 Paroxysmal atrial fibrillation: Secondary | ICD-10-CM

## 2022-10-22 DIAGNOSIS — E782 Mixed hyperlipidemia: Secondary | ICD-10-CM

## 2022-10-22 DIAGNOSIS — Z951 Presence of aortocoronary bypass graft: Secondary | ICD-10-CM | POA: Diagnosis not present

## 2022-10-22 DIAGNOSIS — I447 Left bundle-branch block, unspecified: Secondary | ICD-10-CM | POA: Diagnosis not present

## 2022-10-22 DIAGNOSIS — I10 Essential (primary) hypertension: Secondary | ICD-10-CM

## 2022-10-22 DIAGNOSIS — Z952 Presence of prosthetic heart valve: Secondary | ICD-10-CM

## 2022-10-22 NOTE — Progress Notes (Signed)
Cardiology Office Note:    Date:  10/22/2022   ID:  Craig Lawson, DOB 05-30-1945, MRN [id number]  PCP:  Oletha Blend, MD  Cardiologist:  Norman Herrlich, MD    Referring MD: Montez Hageman, DO    ASSESSMENT:    1. Hx of CABG   2. S/P AVR (aortic valve replacement)   3. LBBB (left bundle branch block)   4. Essential hypertension   5. Mixed hyperlipidemia   6. Paroxysmal atrial fibrillation (HCC)    PLAN:    In order of problems listed above:  [name] is made a good recovery from his bypass surgery aortic valve replacement having no cardiovascular symptoms of edema shortness of breath chest pain palpitation or syncope we will continue his long-term dual antiplatelet therapy and current lipid-lowering. Blood pressure well-controlled continue thiazide diuretic and stop his beta-blocker with bradycardia and dizziness No recurrent atrial fibrillation after bypass not anticoagulated Continue his current lipid-lowering if unable to tolerate twice weekly statin switch to bempedoic acid I will plan to do a lipid profile in September if he does not have one done by his PCP before that time   Next appointment: 6 months   Medication Adjustments/Labs and Tests Ordered: Current medicines are reviewed at length with the patient today.  Concerns regarding medicines are outlined above.  No orders of the defined types were placed in this encounter.  No orders of the defined types were placed in this encounter.    History of Present Illness:    Craig Lawson is a 77 y.o. male with a hx of CAD and aortic stenosis stenosis with CABG and surgical AVR 07/11/2022 with 25mm Inspiris Pericardial Valve postoperative atrial fibrillation mild dilation of ascending aorta left bundle branch block hypertension hyperlipidemia statin intolerance carotid artery disease obesity and obstructive sleep apnea intolerant to CPAP.  Last seen 08/11/2022.  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 He is taking his statin 2 times a week with poor tolerance and muscle symptoms and if he is unable to take it we will transition to bempedoic acid and continue his Zetia On his own he is reduced his beta-blocker to once daily he gets lightheaded at times his heart rate is 52 and will discontinue and trend his blood pressures at home continue thiazide diuretic for hypertension will contact me if his systolics are greater then 140 We will recheck his lipid profile CMP September Past Medical History:  Diagnosis Date   Aortic stenosis 08/10/2015   Bilateral carotid artery disease (HCC) 06/2022   mild bilaterally   Bilateral leg edema 08/22/2015   Bradycardia 06/01/2017   Cellulitis of left lower extremity 04/29/2017   Coronary artery disease involving native coronary artery 10/22/2017   DOE (dyspnea on exertion) 05/04/2017   Essential hypertension 08/13/2015   Heart murmur 04/29/2017   Kissing, osteophytes 08/22/2015   LBBB (left bundle branch block) 08/22/2015   Lipid disorder 11/30/2017   Lumbar facet joint pain 08/22/2015   Medicare annual wellness visit, subsequent 06/02/2016   Morbid obesity (HCC) 10/22/2017   Murmur 04/29/2017   Myopathy 09/02/2019   Formatting of this note might be different from the original. Secondary to high freq statin use.   Need for vaccination for Strep pneumoniae 03/27/2017   Non-ST elevation myocardial infarction (NSTEMI), subendocardial infarction, subsequent episode of care (HCC) 11/30/2017   Obesity, Class II, BMI 35-39.9 08/10/2015  OSA on CPAP 12/02/2017   Postoperative atrial fibrillation (HCC) 07/11/2022   Pure hypercholesterolemia 05/24/2015   Seborrheic keratoses 03/27/2017   Overview:  Of the right forehead.   Statin intolerance 11/30/2017   Thrombocytopenia (HCC) 04/29/2017   TIA (transient ischemic attack) 12/19/2016   Tinnitus of both ears  08/22/2015   Venous insufficiency 08/10/2015    Current Medications: Current Meds  Medication Sig   aspirin EC 81 MG tablet Take 81 mg by mouth at bedtime.   Cholecalciferol 125 MCG (5000 UT) TABS Take 1 tablet by mouth daily.   clopidogrel (PLAVIX) 75 MG tablet TAKE 1 TABLET BY MOUTH AT  BEDTIME   ezetimibe (ZETIA) 10 MG tablet Take 10 mg by mouth at bedtime.   gabapentin (NEURONTIN) 100 MG capsule Take 1 capsule (100 mg total) by mouth 2 (two) times daily.   hydrochlorothiazide (HYDRODIURIL) 25 MG tablet Take 25 mg by mouth daily.   nitroGLYCERIN (NITROSTAT) 0.4 MG SL tablet Place 1 tablet (0.4 mg total) under the tongue every 5 (five) minutes as needed for chest pain.   rosuvastatin (CRESTOR) 20 MG tablet Take 1 tablet (20 mg total) by mouth daily.      EKGs/Labs/Other Studies Reviewed:    The following studies were reviewed today:  Cardiac Studies & Procedures   CARDIAC CATHETERIZATION  CARDIAC CATHETERIZATION 07/04/2022  Narrative   Dist RCA lesion is 50% stenosed.   2nd Diag lesion is 25% stenosed.   1st Diag lesion is 75% stenosed.   RPDA lesion is 30% stenosed.   RPAV lesion is 60% stenosed.   Prox LAD to Mid LAD lesion is 20% stenosed.   Ost Cx to Prox Cx lesion is 90% stenosed.   Mid LM to Ost LAD lesion is 80% stenosed.   Ost LAD lesion is 80% stenosed.   Previously placed 1st Mrg stent of unknown type is  widely patent.  Severe distal left main stenosis (IVUS minimum lumen area of 4.0 mm2) Severe ostial LAD stenosis (IVUD minimum lumen area of 3.5 mm2). Patent mid LAD stent. Severe ostial Circumflex stenosis. Patent obtuse marginal stent Large dominant RCA with moderate distal stenosis, patent PDA stent, moderate posterolateral artery stenosis. Severe aortic stenosis by echo (Cath data: mean gradient 32.8 mmHg, peak to peak gradient 42 mmHg)  Recommendations: Severe distal left main stenosis involving both the LAD and Circumflex, confirmed with IVUS imaging.  Severe aortic stenosis. He will need a CT surgery consult for CABG and AVR. He has been on Plavix so I will begin holding that tonight. Repeat echo tomorrow. 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 He will also need Plavix washout before surgery. Resume IV heparin 8 hours post sheath pull. Will call CT surgery to see him tomorrow.  Findings Coronary Findings Diagnostic  Dominance: Right  Left Main Mid LM to Ost LAD lesion is 80% stenosed.  Left Anterior Descending Ost LAD lesion is 80% stenosed. Prox LAD to Mid LAD lesion is 20% stenosed. The lesion was previously treated using a drug eluting stent over 2 years ago.  First Diagonal Branch 1st Diag lesion is 75% stenosed.  Second Diagonal Branch 2nd Diag lesion is 25% stenosed. The lesion was previously treated .  Left Circumflex Ost Cx to Prox Cx lesion is 90% stenosed.  First Obtuse Marginal Branch Previously placed 1st Mrg stent of unknown type is  widely patent.  Right Coronary Artery Dist RCA lesion is 50% stenosed.  Right Posterior Descending Artery RPDA lesion is 30% stenosed. The lesion was previously treated using a drug eluting stent over 2 years ago.  Right Posterior Atrioventricular Artery RPAV lesion is 60% stenosed.  Intervention  No interventions have been documented.   CARDIAC CATHETERIZATION  CARDIAC CATHETERIZATION 08/16/2019  Narrative  2nd Diag lesion is 80% stenosed.  Balloon angioplasty was performed using a BALLOON SAPPHIRE 2.0X12.  Post intervention, there is a 25% residual stenosis.  Prox LAD to Mid LAD lesion is 70% stenosed.  A drug-eluting stent was successfully placed using a SYNERGY XD 2.75X38, after orbital atherectomy, dilated to 3.25 mm. Stent size optimized with IVUS.  Post intervention, there is a 0% residual stenosis.  Ost Cx to Prox Cx lesion is 50% stenosed.  Dist RCA lesion is 50% stenosed.  RPDA stent that  was treated a few weeks ago was widely patent.  1st Diag lesion is 75% stenosed.  LV end diastolic pressure is normal.  There is mild aortic valve stenosis. Mild gradient noted on pullback.  Continue aggressive secondary prevention.  Would recommend clopidogrel monotherapy after 6 months of DAPT given his extensive disease.  Given the residual pain from the jailed diagonals, will watch overnight and treat with pain medication.  Findings Coronary Findings Diagnostic  Dominance: Right  Left Anterior Descending Prox LAD to Mid LAD lesion is 70% stenosed. The lesion was previously treated. IVUS would not cross  First Diagonal Branch 1st Diag lesion is 75% stenosed.  Second Diagonal Branch 2nd Diag lesion is 80% stenosed.  Left Circumflex Ost Cx to Prox Cx lesion is 50% stenosed.  Right Coronary Artery Dist RCA lesion is 50% stenosed.  Right Posterior Descending Artery Non-stenotic RPDA lesion. stent appeared underexpanded.  Vessel appeared to be 3.0 mm in diameter.  Intervention  Prox LAD to Mid LAD lesion Atherectomy CATH LAUNCHER 6FR EBU 3.75 guide catheter was inserted. WIRE VIPERWIRE COR FLEX TIP guidewire was used to cross lesion. Orbital atherectomy was performed using a CROWN DIAMONDBACK CLASSIC 1.25. Stent CATH LAUNCHER 6FR EBU 3.75 guide catheter was inserted. Lesion crossed with guidewire using a WIRE ASAHI PROWATER 180CM. Pre-stent angioplasty was performed using a BALLOON SAPPHIRE 2.5X20. A drug-eluting stent was successfully placed using a SYNERGY XD 2.75X38. Stent strut is well apposed. Post-stent angioplasty was performed using a BALLOON SAPPHIRE [place] 3.25X15. Pre stent IVUS after athetectomy showed one focal area of concentric calcium that was treated with the 2.5 balloon.   THere was difficulty delivering the stent requiring buddy wires.Post stent IVUS showed the stent was well apposed, but there was significant underlying calcium that affected the shape of the  stent. Post-Intervention Lesion Assessment The intervention was successful. Pre-interventional TIMI flow is 3. Post-intervention TIMI flow is 3. No complications occurred at this lesion. Ultrasound (IVUS) was performed on the lesion post PCI. Stent well apposed. There is a 0% residual stenosis post intervention.  2nd Diag lesion Angioplasty CATH LAUNCHER 6FR EBU 3.75 guide catheter was inserted. WIRE HI TORQ BMW 190CM guidewire used to cross lesion. Balloon angioplasty was performed using a BALLOON SAPPHIRE 2.0X12. Maximum pressure: 8 atm. Post-Intervention Lesion Assessment The intervention was successful. Pre-interventional TIMI flow is 3. Post-intervention TIMI flow is 3. No complications occurred at this lesion. There is a 25% residual stenosis post intervention.   STRESS TESTS  MYOCARDIAL PERFUSION IMAGING 12/07/2018  Narrative  The left ventricular ejection fraction is mildly decreased (45-54%).  Nuclear stress EF: 51%.  There was no ST segment deviation noted during stress.  The study is normal.  This is a low risk study.   ECHOCARDIOGRAM  ECHOCARDIOGRAM COMPLETE 08/22/2022  Narrative ECHOCARDIOGRAM REPORT    Patient Name:   KEEGHAN KUDER Date of Exam: 08/22/2022 Medical Rec #:  [id number]     Height:       69.0 in Accession #:    [id number]    Weight:       245.0 lb Date of Birth:  [birth year]      BSA:          2.252 m Patient Age:    77 years      BP:           130/74 mmHg Patient Gender: M             HR:           59 bpm. Exam Location:  [facility]  Procedure: 2D Echo, Cardiac Doppler, Color Doppler and Strain Analysis  Indications:    Nonrheumatic aortic valve stenosis [I35.0 (ICD-10-CM)]  History:        Patient has prior history of Echocardiogram examinations, most recent 07/14/2022. CAD and Previous Myocardial Infarction, Prior CABG, TIA, Aortic Valve Disease, Arrythmias:LBBB; Risk Factors:Hypertension and Dyslipidemia. Aortic Valve: 25 mm Inspiris valve is  present in the aortic position. Procedure Date: 07/11/2022.  Sonographer:    Margreta Journey RDCS Referring Phys: [id number] Jibreel Fedewa J Jashan Cotten  IMPRESSIONS   1. Left ventricular ejection fraction, by estimation, is 50 to 55%. The left ventricle has low normal function. The left ventricle has no regional wall motion abnormalities. The left ventricular internal cavity size was moderately dilated. Left ventricular diastolic parameters are consistent with Grade I diastolic dysfunction (impaired relaxation). The average left ventricular global longitudinal strain is -12.4 %. The global longitudinal strain is abnormal. 2. Right ventricular systolic function is normal. The right ventricular size is normal. 3. The mitral valve is normal in structure. No evidence of mitral valve regurgitation. No evidence of mitral stenosis. 4. The aortic valve has been repaired/replaced. Aortic valve regurgitation is not visualized. Mild aortic valve stenosis. There is a 25 mm Inspiris valve present in the aortic position. Procedure Date: 07/11/2022. Echo findings are consistent with normal structure and function of the aortic valve prosthesis. Aortic valve area, by VTI measures 1.83 cm. Aortic valve mean gradient measures 6.8 mmHg. 5. Aneurysm of the ascending aorta, measuring 40 mm. 6. The inferior vena cava is normal in size with greater than 50% respiratory variability, suggesting right atrial pressure of 3 mmHg.  FINDINGS Left Ventricle: Left ventricular ejection fraction, by estimation, is 50 to 55%. The left ventricle has low normal function. The left ventricle has no regional wall motion abnormalities. The average left ventricular global longitudinal strain is -12.4 %. The global longitudinal strain is abnormal. The left ventricular internal cavity size was moderately dilated. There is no left ventricular hypertrophy. Abnormal (paradoxical) septal motion, consistent with left bundle branch block. Left  ventricular diastolic parameters are consistent with Grade I diastolic dysfunction (impaired relaxation).  Right Ventricle: The right ventricular size is normal. No increase in right ventricular wall thickness. Right ventricular systolic function is normal.  Left Atrium: Left atrial size was normal in size.  Right Atrium: Right atrial size was normal in size.  Pericardium: There is no evidence of pericardial effusion.  Mitral Valve: The mitral valve is normal in structure. No evidence of mitral valve regurgitation. No evidence of mitral valve stenosis.  Tricuspid Valve: The tricuspid valve is normal in structure. Tricuspid  valve regurgitation is not demonstrated. No evidence of tricuspid stenosis.  Aortic Valve: The aortic valve has been repaired/replaced. Aortic valve regurgitation is not visualized. Mild aortic stenosis is present. Aortic valve mean gradient measures 6.8 mmHg. Aortic valve peak gradient measures 12.9 mmHg. Aortic valve area, by VTI measures 1.83 cm. There is a 25 mm Inspiris valve present in the aortic position. Procedure Date: 07/11/2022. Echo findings are consistent with normal structure and function of the aortic valve prosthesis.  Pulmonic Valve: The pulmonic valve was normal in structure. Pulmonic valve regurgitation is not visualized. No evidence of pulmonic stenosis.  Aorta: The aortic root is normal in size and structure. There is an aneurysm involving the ascending aorta measuring 40 mm.  Venous: The inferior vena cava is normal in size with greater than 50% respiratory variability, suggesting right atrial pressure of 3 mmHg.  IAS/Shunts: No atrial level shunt detected by color flow Doppler.   LEFT VENTRICLE PLAX 2D LVIDd:         6.30 cm      Diastology LVIDs:         4.80 cm      LV e' medial:    4.82 cm/s LV PW:         1.00 cm      LV E/e' medial:  18.7 LV IVS:        0.80 cm      LV e' lateral:   7.72 cm/s LVOT diam:     2.00 cm      LV E/e' lateral:  11.7 LV SV:         71 LV SV Index:   32           2D Longitudinal Strain LVOT Area:     3.14 cm     2D Strain GLS Avg:     -12.4 %  LV Volumes (MOD) LV vol d, MOD A2C: 113.0 ml LV vol d, MOD A4C: 173.0 ml LV vol s, MOD A2C: 53.2 ml LV vol s, MOD A4C: 92.2 ml LV SV MOD A2C:     59.8 ml LV SV MOD A4C:     173.0 ml LV SV MOD BP:      73.7 ml  RIGHT VENTRICLE             IVC RV Basal diam:  3.50 cm     IVC diam: 1.70 cm RV Mid diam:    2.70 cm RV S prime:     12.50 cm/s TAPSE (M-mode): 1.8 cm  LEFT ATRIUM             Index        RIGHT ATRIUM           Index LA diam:        5.10 cm 2.26 cm/m   RA Area:     16.80 cm LA Vol (A2C):   65.0 ml 28.86 ml/m  RA Volume:   39.00 ml  17.32 ml/m LA Vol (A4C):   66.2 ml 29.39 ml/m LA Biplane Vol: 69.1 ml 30.68 ml/m AORTIC VALVE                     PULMONIC VALVE AV Area (Vmax):    1.89 cm      PR End Diast Vel: 1.65 msec AV Area (Vmean):   1.83 cm AV Area (VTI):     1.83 cm AV Vmax:           179.60  cm/s AV Vmean:          117.400 cm/s AV VTI:            0.388 m AV Peak Grad:      12.9 mmHg AV Mean Grad:      6.8 mmHg LVOT Vmax:         108.00 cm/s LVOT Vmean:        68.533 cm/s LVOT VTI:          0.226 m LVOT/AV VTI ratio: 0.58  AORTA Ao Root diam: 3.60 cm Ao Asc diam:  4.00 cm  MITRAL VALVE MV Area (PHT): 2.96 cm     SHUNTS MV Decel Time: 257 msec     Systemic VTI:  0.23 m MV E velocity: 90.23 cm/s   Systemic Diam: 2.00 cm MV A velocity: 101.40 cm/s MV E/A ratio:  0.89  Gypsy Balsam MD Electronically signed by Gypsy Balsam MD Signature Date/Time: 08/22/2022/12:42:21 PM    Final   TEE  ECHO INTRAOPERATIVE TEE 07/20/2022  Narrative *INTRAOPERATIVE TRANSESOPHAGEAL REPORT *    Patient Name:   Craig Lawson Date of Exam: 07/11/2022 Medical Rec #:  [id number]     Height:       69.0 in Accession #:    [id number]    Weight:       249.6 lb Date of Birth:  12-25-1945      BSA:          2.27 m Patient Age:     77 years      BP:           134/69 mmHg Patient Gender: M             HR:           59 bpm. Exam Location:  Inpatient  Transesophogeal exam was perform intraoperatively during surgical procedure. Patient was closely monitored under general anesthesia during the entirety of examination.  Indications:     I25.10 CAD Performing Phys: [id number] Eugenio Hoes  Complications: No known complications during this procedure. POST-OP IMPRESSIONS _ Left Ventricle: has mildly reduced systolic function, with an ejection fraction of 50%. The wall motion is septal dyssynchrony from pacing, inferior wall mildly hypokinetic relative to anterior wall. _ Right Ventricle: normal function. The cavity was normal. The wall motion is normal. _ Aorta: there is no dissection present in the aorta. 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 The gradient recorded across the prosthetic valve is within the expected range, measuring 4 mmHg. _ Mitral Valve: The mitral valve appears unchanged from pre-bypass. _ Tricuspid Valve: Mild regurgitation post repair.  PRE-OP FINDINGS Left Ventricle: The left ventricle has low normal systolic function, with an ejection fraction of 50-55%. The cavity size was normal. No evidence of left ventricular regional wall motion abnormalities. There is moderate concentric left ventricular hypertrophy.   Right Ventricle: The right ventricle has normal systolic function. The cavity was normal. There is no increase in right ventricular wall thickness.  Left Atrium: Left atrial size was not assessed. No left atrial/left atrial appendage thrombus was detected. Left atrial appendage velocity is normal at greater than 40 cm/s.  Right Atrium: Right atrial size was not assessed.  Interatrial Septum: No atrial level shunt detected by color flow Doppler. There is no evidence of a patent foramen ovale.  Pericardium: There is no evidence  of pericardial effusion.  Mitral Valve: The mitral valve is normal in structure.  Mitral valve regurgitation is mild by color flow Doppler. The MR jet is centrally-directed. There is No evidence of mitral stenosis.  Tricuspid Valve: The tricuspid valve was normal in structure. Tricuspid valve regurgitation was not visualized by color flow Doppler. No evidence of tricuspid stenosis is present.  Aortic Valve: The aortic valve is tricuspid Aortic valve regurgitation is mild by color flow Doppler. The jet is posteriorly-directed. There is moderate stenosis of the aortic valve, with a calculated valve area of 1.38 cm. There is severe thickening and severe calcifcation present on the aortic valve left coronary and non-coronary cusps with severely decreased mobility and there is moderate thickening and mild calcification present on the aortic valve right coronary cusp with mildly decreased mobility.  Pulmonic Valve: The pulmonic valve was normal in structure, with normal. No evidence of pumonic stenosis. Pulmonic valve regurgitation is not visualized by color flow Doppler.   Aorta: There is evidence of plaque in the descending aorta; Grade I, measuring 1-94mm in size. There is evidence of a dissection in the none.  Shunts: There is no evidence of an atrial septal defect.  +--------------+--------++ LEFT VENTRICLE         +--------------+--------++ PLAX 2D                +--------------+--------++ LVOT diam:    2.20 cm  +--------------+--------++ LVOT Area:    3.80 cm +--------------+--------++                        +--------------+--------++  +------------------+------------++ AORTIC VALVE                   +------------------+------------++ AV Area (Vmax):   1.55 cm     +------------------+------------++ AV Area (Vmean):  1.44 cm     +------------------+------------++ AV Area (VTI):    1.38 cm     +------------------+------------++ AV Vmax:           286.33 cm/s  +------------------+------------++ AV Vmean:         198.967 cm/s +------------------+------------++ AV VTI:           0.610 m      +------------------+------------++ AV Peak Grad:     32.8 mmHg    +------------------+------------++ AV Mean Grad:     20.7 mmHg    +------------------+------------++ LVOT Vmax:        116.85 cm/s  +------------------+------------++ LVOT Vmean:       75.550 cm/s  +------------------+------------++ LVOT VTI:         0.221 m      +------------------+------------++ LVOT/AV VTI ratio:0.36         +------------------+------------++  +-------------+---------++ MITRAL VALVE           +--------------+-------+ +-------------+---------++ SHUNTS                MV Peak grad:4.1 mmHg  +--------------+-------+ +-------------+---------++ Systemic VTI: 0.22 m  MV Mean grad:1.0 mmHg  +--------------+-------+ +-------------+---------++ Systemic Diam:2.20 cm MV Vmax:     1.01 m/s  +--------------+-------+ +-------------+---------++ MV Vmean:    45.1 cm/s +-------------+---------++ MV VTI:      0.25 m    +-------------+---------++   Val Eagle MD Electronically signed by Val Eagle MD Signature Date/Time: 07/20/2022/8:52:48 AM    Final   MONITORS  LONG TERM MONITOR (3-14 DAYS) 10/25/2018  Narrative A ZIO monitor was applied for 3 days to assess palpitation following MI beginning 10/12/2018.  Rhythm throughout was sinus with bundle branch block minimum average and maximum heart rates of  41, 55 and 125 bpm.  There were no pauses of 3 seconds or greater and no episodes of sinus node or AV nodal block.  There were 3 triggered and 3 diary events predominantly associated with PVCs.  Ventricular ectopy was rare with isolated PVCs.  Supraventricular ectopy was rare there were no episodes of atrial fibrillation or flutter.  There were 3 brief episodes of runs of  atrial premature contractions the longest 6 complexes at a rate of 113 bpm.   Conclusion symptomatic PVCs, however PVC burden is low and the occurrence of palpitation is infrequent               Recent Labs: 07/13/2022: B Natriuretic Peptide 235.1 07/16/2022: Magnesium 2.3 08/11/2022: ALT 16; BUN 17; Creatinine, Ser 1.04; Hemoglobin 12.8; NT-Pro BNP 663; Platelets 224; Potassium 4.3; Sodium 143; TSH 6.890  Recent Lipid Panel    Component Value Date/Time   CHOL 184 07/04/2022 0258   CHOL 189 07/05/2019 0853   TRIG 196 (H) 07/04/2022 0258   HDL 37 (L) 07/04/2022 0258   HDL 43 07/05/2019 0853   CHOLHDL 5.0 07/04/2022 0258   VLDL 39 07/04/2022 0258   LDLCALC 108 (H) 07/04/2022 0258   LDLCALC 118 (H) 07/05/2019 0853    Physical Exam:    VS:  BP 124/82 (BP Location: Left Arm, Patient Position: Sitting, Cuff Size: Normal)   Pulse (!) 52   Ht 5\' 9"  (1.753 m)   Wt 250 lb 9.6 oz (113.7 kg)   SpO2 93%   BMI 37.01 kg/m     Wt Readings from Last 3 Encounters:  10/22/22 250 lb 9.6 oz (113.7 kg)  09/10/22 245 lb (111.1 kg)  08/19/22 245 lb (111.1 kg)     GEN:  Well nourished, well developed in no acute distress HEENT: Normal NECK: No JVD; No carotid bruits LYMPHATICS: No lymphadenopathy CARDIAC: 1-6 flow murmur no AR RRR, no murmurs, rubs, gallops RESPIRATORY:  Clear to auscultation without rales, wheezing or rhonchi  ABDOMEN: Soft, non-tender, non-distended MUSCULOSKELETAL:  No edema; No deformity  SKIN: Warm and dry NEUROLOGIC:  Alert and oriented x 3 PSYCHIATRIC:  Normal affect    Signed, Norman Herrlich, MD  10/22/2022 1:38 PM    [facility]

## 2022-10-22 NOTE — Patient Instructions (Signed)
Medication Instructions:  Your physician has recommended you make the following change in your medication:   STOP: Metoprolol  *If you need a refill on your cardiac medications before your next appointment, please call your pharmacy*   Lab Work: Your physician recommends that you return for lab work in:   Labs in September 2024: CMP, Lipids  If you have labs (blood work) drawn today and your tests are completely normal, you will receive your results only by: MyChart Message (if you have MyChart) OR A paper copy in the mail If you have any lab test that is abnormal or we need to change your treatment, we will call you to review the results.   Testing/Procedures: None   Follow-Up: At [facility], you and your health needs are our priority.  As part of our continuing mission to provide you with exceptional heart care, we have created designated Provider Care Teams.  These Care Teams include your primary Cardiologist (physician) and Advanced Practice Providers (APPs -  Physician Assistants and Nurse Practitioners) who all work together to provide you with the care you need, when you need it.  We recommend signing up for the patient portal called "MyChart".  Sign up information is provided on this After Visit Summary.  MyChart is used to connect with patients for Virtual Visits (Telemedicine).  Patients are able to view lab/test results, encounter notes, upcoming appointments, etc.  Non-urgent messages can be sent to your provider as well.   To learn more about what you can do with MyChart, go to [email or web address].    Your next appointment:   6 month(s)  Provider:   Norman Herrlich, MD    Other Instructions Check home blood pressures daily and notify if running greater than 140 systolic

## 2022-10-22 NOTE — Addendum Note (Signed)
Addended by: Roxanne Mins I on: 10/22/2022 02:01 PM   Modules accepted: Orders

## 2022-10-29 DIAGNOSIS — Z953 Presence of xenogenic heart valve: Secondary | ICD-10-CM

## 2022-10-29 HISTORY — DX: Presence of xenogenic heart valve: Z95.3

## 2022-11-05 ENCOUNTER — Encounter: Payer: Self-pay | Admitting: Nurse Practitioner

## 2022-11-05 ENCOUNTER — Ambulatory Visit: Payer: Medicare Other | Admitting: Nurse Practitioner

## 2022-11-05 VITALS — BP 132/64 | HR 63 | Temp 98.0°F | Ht 69.0 in | Wt 255.2 lb

## 2022-11-05 DIAGNOSIS — I350 Nonrheumatic aortic (valve) stenosis: Secondary | ICD-10-CM | POA: Diagnosis not present

## 2022-11-05 DIAGNOSIS — G4734 Idiopathic sleep related nonobstructive alveolar hypoventilation: Secondary | ICD-10-CM | POA: Diagnosis not present

## 2022-11-05 DIAGNOSIS — G4733 Obstructive sleep apnea (adult) (pediatric): Secondary | ICD-10-CM

## 2022-11-05 NOTE — Patient Instructions (Addendum)
Start BiPAP IPAP max 21, EPAP min 17, PS 4, every night, minimum of 4-6 hours a night.  Change equipment every 30 days or as directed by DME. Wash your tubing with warm soap and water daily, hang to dry. Wash humidifier portion weekly. Use bottled, distilled water and change daily  Maintain clean equipment, as directed by home health agency.  Be aware of reduced alertness and do not drive or operate heavy machinery if experiencing this or drowsiness.  Notify if persistent daytime sleepiness occurs even with consistent use of CPAP.  We discussed how untreated sleep apnea puts an individual at risk for cardiac arrhthymias, pulm HTN, DM, stroke and increases their risk for daytime accidents.   Follow up in 10-12 weeks with Dr. Wynona Neat or Philis Nettle, or sooner

## 2022-11-05 NOTE — Progress Notes (Signed)
@Patient  ID: Craig Lawson, male    DOB: 1945/07/08, 77 y.o.   MRN: [id number]  Chief Complaint  Patient presents with   Follow-up    Pt here to discuss sleep study results     Referring provider: Montez Hageman, DO  HPI: 77 year old male, smoker followed for pneumonia, acute respiratory failure and sleep apnea not on CPAP.  New to the pulmonary clinic and last seen by PCCM group during his recent hospitalization. Past medical history significant for CAD status post CABG x 2, NSTEMI, AMS, hypertension, history of TIA, history of OSA not on CPAP, obesity, HLD.   TEST/EVENTS:  07/05/2022 CT chest wo con: diffused CAD. Scattered mediastinal lymph nodes. No acute process in the lungs.  07/14/2022 echo limited: EF 50-55%, GIIDD. RV size nl, function low normal. 08/19/2022 Split Night CPAP: AHI 36.6/h, SpO2 low 60%. Tried up to 14 cmH2O on CPAP but continued to have obstructive events 09/10/2022 CPAP/BiPAP titration >>transitioned to BiPAP 21/17 cmH2O. Did not require supplemental O2 with this   07/31/2022: OV with Rito Lecomte NP for sleep consult with his wife.  He was referred by inpatient PCCM provider, Dr. Chestine Spore.  He had been admitted from 07/03/2022 to 07/22/2022 following NSTEMI.  He was found to have moderate to severe aortic stenosis as well as two-vessel coronary disease.  He was deemed high risk so was kept in the hospital for urgent revascularization.  He underwent uneventful bioprosthetic AVR and CABG x 2 on 3/22.  Following his procedure, he developed increased dyspnea and diaphoresis.  Imaging showed a left basilar infiltrate versus atelectasis. He was started on empiric cefepime course for HAP and continued diuresis. He also had an enlarging pleural effusion so underwent thoracentesis on 3/29. His oxygen status improved but he was still requiring nocturnal supplemental O2 due to desaturations.  He does have a history of OSA but has not been on CPAP in the outpatient setting.  He was started on this  inpatient and advised to have outpatient sleep study.  He was discharged by CT surgery on 4/2 following clinical and radiographic improvement with supplemental oxygen for nocturnal use. Today, he tells me that he has been slowly recovering since he was admitted.  He was there for almost 20 days and so was still feeling weak and a bit fatigued.  He does have plans to start cardiac rehab soon.  From a breathing standpoint, he feels stable but still notices that he gets short winded at times, especially with long distances or uphill climbing.  He has to take frequent breaks. Denies any cough or chest congestion.  No fevers, chills, hemoptysis.  He had repeat chest x-ray on 4/8, which showed some improvement in the left basilar opacity and unchanged small bilateral pleural effusions. He also had oozing of his RLE incision; evaluated by TCTS without evidence of infection. He tells me this is still oozing some bloody discharge but seems to be some better. He has follow up with them in one week and cardiology at the end of May. Let swelling is stable.   He has a previous history of sleep apnea but he does not remember exactly when this was diagnosed.  He had trouble tolerating the CPAP so he stopped using it many years ago.  He does have snoring and witnessed apneas according to his wife who is with him today.  He recently has felt more tired but before this did not notice that he had any excessive daytime fatigue.  His wife  does tell me that he dozes frequently while watching TV.  He denies any morning headaches, dry mouth, drowsy driving, sleep parasomnia/paralysis.  No history of narcolepsy or symptoms of cataplexy. Wearing 2 lpm supplemental O2 at night. He usually gets about 8 hours of sleep at night.  Falls asleep quickly.  Stays asleep during the night.  No issues with frequent night awakenings.  Does not take anything to help him sleep at night.   He is a former smoker, quit in 1973.  He drinks 1 to 2 glasses  of wine in the evenings.  Drinks 2 cups of coffee in the morning.  He is retired. [employer]  11/05/2022: Today - follow up Patient presents today for follow up to discuss sleep study results which revealed severe sleep apnea, poorly controlled on CPAP and corrected with BiPAP. He never received a BiPAP machine following this. He feels relatively unchanged from a sleep/fatigue standpoint. His breathing and stamina have improved with cardiopulmonary rehab. No drowsy driving, sleep parasomnias/paralysis. Willing to retry PAP therapy.   Allergies  Allergen Reactions   Penicillins     Had rash in his 30s-40s with this as well as recurrence when re-trialed   Statins Other (See Comments)    Muscle pains; increasing with continued use.   Muscle pains; increasing with continued use.    Isosorbide Other (See Comments)    "makes me dizzy and I have headaches"   Latex Rash    Has noticed with bandages in past, leaves a sore on skin but more recently with APAP mask     Immunization History  Administered Date(s) Administered   Influenza-Unspecified 03/04/2019, 03/06/2020, 03/28/2021, 04/23/2022   PFIZER Comirnaty(Gray Top)Covid-19 Tri-Sucrose Vaccine 06/20/2019   PFIZER(Purple Top)SARS-COV-2 Vaccination 07/11/2019, 01/26/2020    Past Medical History:  Diagnosis Date   Aortic stenosis 08/10/2015   Bilateral carotid artery disease (HCC) 06/2022   mild bilaterally   Bilateral leg edema 08/22/2015   Bradycardia 06/01/2017   Cellulitis of left lower extremity 04/29/2017   Coronary artery disease involving native coronary artery 10/22/2017   DOE (dyspnea on exertion) 05/04/2017   Essential hypertension 08/13/2015   Heart murmur 04/29/2017   Kissing, osteophytes 08/22/2015   LBBB (left bundle branch block) 08/22/2015   Lipid disorder 11/30/2017   Lumbar facet joint pain 08/22/2015   Medicare annual wellness visit, subsequent 06/02/2016   Morbid obesity (HCC) 10/22/2017   Murmur 04/29/2017    Myopathy 09/02/2019   Formatting of this note might be different from the original. Secondary to high freq statin use.   Need for vaccination for Strep pneumoniae 03/27/2017   Non-ST elevation myocardial infarction (NSTEMI), subendocardial infarction, subsequent episode of care (HCC) 11/30/2017   Obesity, Class II, BMI 35-39.9 08/10/2015   OSA on CPAP 12/02/2017   Postoperative atrial fibrillation (HCC) 07/11/2022   Pure hypercholesterolemia 05/24/2015   Seborrheic keratoses 03/27/2017   Overview:  Of the right forehead.   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     Cholecalciferol 125 MCG (5000 UT) TABS Take 1 tablet by mouth daily.     clopidogrel (PLAVIX) 75 MG tablet TAKE 1 TABLET BY MOUTH AT  BEDTIME 100 tablet 0   ezetimibe (ZETIA) 10 MG tablet Take 10 mg by mouth at bedtime.     gabapentin (NEURONTIN) 100 MG capsule Take 1 capsule (100 mg total) by mouth 2 (two) times daily. 60 capsule 1   hydrochlorothiazide (HYDRODIURIL) 25 MG tablet Take 25 mg by mouth daily.     nitroGLYCERIN (NITROSTAT) 0.4 MG SL tablet Place 1 tablet (0.4 mg total) under the tongue every 5 (five) minutes as needed for chest pain. 25 tablet 3   rosuvastatin (CRESTOR) 20 MG tablet Take 1 tablet (20 mg total) by mouth daily. 30 tablet 1   No facility-administered medications prior to visit.     Review of Systems:   Constitutional: No weight loss or gain, night sweats, fevers, chills.  +fatigue, lassitude (improving) HEENT: No headaches, difficulty swallowing, tooth/dental problems, or sore throat. No sneezing, itching, ear ache, nasal congestion, or post nasal drip CV:  +orthopnea. No chest pain, PND, swelling in lower extremities, anasarca, dizziness, palpitations, syncope Resp: +shortness of breath with exertion (improving); snoring; witnessed apneas. No excess mucus or change in color of mucus. No productive or non-productive. No hemoptysis. No wheezing.  No chest wall deformity GI:  No heartburn, indigestion, abdominal pain, nausea, vomiting, diarrhea, change in bowel habits, loss of appetite, bloody stools.  GU: No dysuria, change in color of urine, urgency or frequency.   Skin: No rash, lesions, ulcerations.  MSK:  No joint pain or swelling.   Neuro: No dizziness or lightheadedness.  Psych: No depression or anxiety. Mood stable.     Physical Exam:  BP 132/64 (BP Location: Right Arm, Patient Position: Sitting, Cuff Size: Normal)   Pulse 63   Temp 98 F (36.7 C) (Oral)   Ht 5\' 9"  (1.753 m)   Wt 255 lb 3.2 oz (115.8 kg)   SpO2 97%   BMI 37.69 kg/m   GEN: Pleasant, interactive, well-kempt; obese; in no acute distress. HEENT:  Normocephalic and atraumatic. PERRLA. Sclera white. Nasal turbinates pink, moist and patent bilaterally. No rhinorrhea present. Oropharynx pink and moist, without exudate or edema. No lesions, ulcerations, or postnasal drip. Mallampati III NECK:  Supple w/ fair ROM. No JVD present. Normal carotid impulses w/o bruits. Thyroid symmetrical with no goiter or nodules palpated. No lymphadenopathy.   CV: RRR, flow murmur, no r/g, no peripheral edema. Pulses intact, +2 bilaterally. No cyanosis, pallor or clubbing. PULMONARY:  Unlabored, regular breathing. Diminished bases, otherwise clear bilaterally A&P w/o wheezes/rales/rhonchi. No accessory muscle use.  GI: BS present and normoactive. Soft, non-tender to palpation. No organomegaly or masses  detected.  MSK: No erythema, warmth or tenderness. Cap refil <2 sec all extrem. No deformities or joint swelling noted.  Neuro: A/Ox3. No focal deficits noted.   Skin: Warm, no lesions or rashes Psych: Normal affect and behavior. Judgement and thought content appropriate.     Lab Results:  CBC    Component Value Date/Time   WBC 7.0 08/11/2022 0916   WBC 7.9 07/20/2022 0114   RBC 4.52 08/11/2022 0916   RBC 3.20 (L) 07/20/2022 0114   HGB 12.8 (L) 08/11/2022 0916   HCT 39.4 08/11/2022 0916   PLT 224 08/11/2022 0916   MCV 87 08/11/2022 0916   MCH 28.3 08/11/2022 0916   MCH 30.6 07/20/2022  0114   MCHC 32.5 08/11/2022 0916   MCHC 33.8 07/20/2022 0114   RDW 14.7 08/11/2022 0916   LYMPHSABS 1.4 08/11/2022 0916   EOSABS 0.4 08/11/2022 0916   BASOSABS 0.0 08/11/2022 0916    BMET    Component Value Date/Time   NA 143 08/11/2022 0916   K 4.3 08/11/2022 0916   CL 100 08/11/2022 0916   CO2 23 08/11/2022 0916   GLUCOSE 102 (H) 08/11/2022 0916   GLUCOSE 108 (H) 07/20/2022 0114   BUN 17 08/11/2022 0916   CREATININE 1.04 08/11/2022 0916   CALCIUM 9.3 08/11/2022 0916   GFRNONAA >60 07/20/2022 0114   GFRAA 99 11/28/2019 1401    BNP    Component Value Date/Time   BNP 235.1 (H) 07/13/2022 1745     Imaging:  No results found.  Administration History     None           No data to display          No results found for: "NITRICOXIDE"      Assessment & Plan:   Severe obstructive sleep apnea Severe OSA. Poor control on CPAP during titration study. Transitioned to BiPAP 21/17 with good control and no need for supplemental O2. Reviewed risks of untreated OSA. Shared decision to move forward with BiPAP therapy. Urgent orders for auto BiPAP IPAP max 21, EPAP min 17, PS 4 with full face mask and heated humidity. Educated on proper use/care of device. Risks/benefits reviewed. Cautioned on safe driving practices.  Patient Instructions  Start BiPAP IPAP max 21, EPAP  min 17, PS 4, every night, minimum of 4-6 hours a night.  Change equipment every 30 days or as directed by DME. Wash your tubing with warm soap and water daily, hang to dry. Wash humidifier portion weekly. Use bottled, distilled water and change daily  Maintain clean equipment, as directed by home health agency.  Be aware of reduced alertness and do not drive or operate heavy machinery if experiencing this or drowsiness.  Notify if persistent daytime sleepiness occurs even with consistent use of CPAP.  We discussed how untreated sleep apnea puts an individual at risk for cardiac arrhthymias, pulm HTN, DM, stroke and increases their risk for daytime accidents.   Follow up in 10-12 weeks with Dr. Wynona Neat or Florentina Addison Azaan Leask,NP, or sooner   Nocturnal hypoxia See above  Severe aortic stenosis S/p TAVR. Recovering well. F/u with cardiology as scheduled.     I spent 35 minutes of dedicated to the care of this patient on the date of this encounter to include pre-visit review of records, face-to-face time with the patient discussing conditions above, post visit ordering of testing, clinical documentation with the electronic health record, making appropriate referrals as documented, and communicating necessary findings to members of the patients care team.  Noemi Chapel, NP 11/05/2022  Pt aware and understands NP's role.

## 2022-11-05 NOTE — Assessment & Plan Note (Signed)
S/p TAVR. Recovering well. F/u with cardiology as scheduled.

## 2022-11-05 NOTE — Assessment & Plan Note (Signed)
Severe OSA. Poor control on CPAP during titration study. Transitioned to BiPAP 21/17 with good control and no need for supplemental O2. Reviewed risks of untreated OSA. Shared decision to move forward with BiPAP therapy. Urgent orders for auto BiPAP IPAP max 21, EPAP min 17, PS 4 with full face mask and heated humidity. Educated on proper use/care of device. Risks/benefits reviewed. Cautioned on safe driving practices.  Patient Instructions  Start BiPAP IPAP max 21, EPAP min 17, PS 4, every night, minimum of 4-6 hours a night.  Change equipment every 30 days or as directed by DME. Wash your tubing with warm soap and water daily, hang to dry. Wash humidifier portion weekly. Use bottled, distilled water and change daily  Maintain clean equipment, as directed by home health agency.  Be aware of reduced alertness and do not drive or operate heavy machinery if experiencing this or drowsiness.  Notify if persistent daytime sleepiness occurs even with consistent use of CPAP.  We discussed how untreated sleep apnea puts an individual at risk for cardiac arrhthymias, pulm HTN, DM, stroke and increases their risk for daytime accidents.   Follow up in 10-12 weeks with Dr. Wynona Neat or Philis Nettle, or sooner

## 2022-11-05 NOTE — Assessment & Plan Note (Signed)
 See above

## 2022-11-20 ENCOUNTER — Encounter: Payer: Self-pay | Admitting: Pulmonary Disease

## 2022-11-24 NOTE — Telephone Encounter (Signed)
Patient is not showing in [facility].  I have sent a community message to Chrisman, Lorenz Coaster, requesting patient be uploaded to [facility] for cpap compliance report. I will update once community message is received.

## 2022-11-24 NOTE — Telephone Encounter (Signed)
Message received from patient below-   I have received my p-pap machine and have used it for 3 nights. My problem is ,it is set so high, I cant exhale and therefore cant go to sleep. If I could get it turned down a bit, i feel i could better deal with it.     Could i please get the pressure turned down some?                 Thank you .    [name]   Message routed to [employer], NP

## 2022-11-27 ENCOUNTER — Telehealth: Payer: Self-pay | Admitting: Cardiology

## 2022-11-27 NOTE — Telephone Encounter (Signed)
French Ana with [facility] states patient is about to graduate rehab but would like to inform Dr. Dulce Sellar that ever since starting rehab weight has slowly increased, about 8 lbs--starting weight was 248.4 lbs, patient weighed 256.2 today. Per French Ana, patient had has swelling in his ankles and extra fluid underneath his eyes all week--right under-eye was very large today. French Ana mentions that patient has started wearing his BiPAP. Patient has SOB at times, not often. Patient has reported burning in lungs and couging up clear phlegm.  Patient still takes HCTZ and discontinued Lasix as advised. French Ana is unsure whether patient has been elevating this legs. French Ana will return to the office on Monday, but a voicemessage may be left at [phone] prior. Please contact patient with further advisement.

## 2022-11-28 ENCOUNTER — Other Ambulatory Visit: Payer: Self-pay | Admitting: Cardiology

## 2022-11-28 NOTE — Telephone Encounter (Signed)
Left message for the patient to call back.

## 2022-12-01 ENCOUNTER — Telehealth: Payer: Self-pay | Admitting: Cardiology

## 2022-12-01 NOTE — Telephone Encounter (Signed)
Patient is returning RN's call. Please advise. 

## 2022-12-01 NOTE — Telephone Encounter (Signed)
Patient returned RN's call. 

## 2022-12-03 ENCOUNTER — Other Ambulatory Visit: Payer: Self-pay

## 2022-12-03 NOTE — Telephone Encounter (Signed)
This message was continued on another thread.

## 2022-12-03 NOTE — Telephone Encounter (Signed)
Left message for the patient to call back.

## 2022-12-03 NOTE — Telephone Encounter (Signed)
Called the patient and he reported that his weight has been slowly increasing. He has gained 8 lbs. His weight started at 248.4 lbs. and now it is 256.2 lbs. He reports swelling in his lower extremities and his cardiac rehab nurse reported that he had swelling under his eyes. Patient reports no SOB at this time.Patient also has a productive cough with clear phlegm. He is currently taking hydrochlorothiazide as a diuretic. Please advise

## 2022-12-04 NOTE — Telephone Encounter (Signed)
Left message for the patient to call back.

## 2022-12-04 NOTE — Telephone Encounter (Signed)
Patient was returning call. Please advise ?

## 2022-12-05 ENCOUNTER — Other Ambulatory Visit: Payer: Self-pay

## 2022-12-05 DIAGNOSIS — I480 Paroxysmal atrial fibrillation: Secondary | ICD-10-CM

## 2022-12-05 DIAGNOSIS — I10 Essential (primary) hypertension: Secondary | ICD-10-CM

## 2022-12-05 DIAGNOSIS — I351 Nonrheumatic aortic (valve) insufficiency: Secondary | ICD-10-CM

## 2022-12-05 MED ORDER — FUROSEMIDE 20 MG PO TABS: 20.0000 mg | ORAL_TABLET | Freq: Every day | ORAL | 3 refills | Status: DC

## 2022-12-05 NOTE — Telephone Encounter (Signed)
Called patient and informed him of Dr. Hulen Shouts recommendation below:  Stop hydrochlorothiazide start furosemide 20 mg daily 2 weeks BMP   Patient was agreeable with this plan and had no further questions at this time.

## 2022-12-18 ENCOUNTER — Telehealth: Payer: Self-pay | Admitting: Cardiology

## 2022-12-18 DIAGNOSIS — I25118 Atherosclerotic heart disease of native coronary artery with other forms of angina pectoris: Secondary | ICD-10-CM

## 2022-12-18 DIAGNOSIS — I480 Paroxysmal atrial fibrillation: Secondary | ICD-10-CM

## 2022-12-18 DIAGNOSIS — I351 Nonrheumatic aortic (valve) insufficiency: Secondary | ICD-10-CM

## 2022-12-18 DIAGNOSIS — I350 Nonrheumatic aortic (valve) stenosis: Secondary | ICD-10-CM

## 2022-12-18 NOTE — Telephone Encounter (Signed)
Patient is coming in to have labs drawn in the morning. He said he is supposed to come back and get more labs next month and would like to know if they can be drawn together in the morning.   Dr. Dulce Sellar previously ordered an echo to be done in September. Patient is scheduled for 10/02 but the echo order is expired, if you would please put in a new one.   Please call patient at [phone] to discuss further on labs.  Marked as urgent since patient will be here in the morning.

## 2022-12-18 NOTE — Telephone Encounter (Signed)
Echo order updated.  Left vm for pt to callback.

## 2022-12-20 LAB — BASIC METABOLIC PANEL
BUN/Creatinine Ratio: 21 (ref 10–24)
BUN: 24 mg/dL (ref 8–27)
CO2: 26 mmol/L (ref 20–29)
Calcium: 9.3 mg/dL (ref 8.6–10.2)
Chloride: 104 mmol/L (ref 96–106)
Creatinine, Ser: 1.12 mg/dL (ref 0.76–1.27)
Glucose: 104 mg/dL — ABNORMAL HIGH (ref 70–99)
Potassium: 4.6 mmol/L (ref 3.5–5.2)
Sodium: 142 mmol/L (ref 134–144)
eGFR: 67 mL/min/{1.73_m2} (ref >59)

## 2023-01-06 ENCOUNTER — Encounter: Payer: Self-pay | Admitting: Pulmonary Disease

## 2023-01-06 ENCOUNTER — Ambulatory Visit: Payer: Medicare Other | Admitting: Pulmonary Disease

## 2023-01-06 VITALS — BP 130/70 | HR 52 | Ht 69.0 in | Wt 263.8 lb

## 2023-01-06 DIAGNOSIS — G4733 Obstructive sleep apnea (adult) (pediatric): Secondary | ICD-10-CM

## 2023-01-06 NOTE — Patient Instructions (Signed)
Follow-up in about 8 weeks  Continue using your BiPAP nightly  Make sure you follow-up with the echocardiogram to assess the heart  Call us with significant concerns  At the next visit we will reevaluate whether the BiPAP continues to work well with the download from the machine

## 2023-01-06 NOTE — Progress Notes (Addendum)
Craig Lawson    [id number]    17-Mar-1946  Primary Care Physician:Witten, Maple Mirza, MD  Referring [place]: [place] [address] [place],  [place] [address]  Chief complaint:   Follow-up for obstructive sleep apnea  HPI:  Diagnosed with severe obstructive sleep apnea titrated to BiPAP Initial sleep split-night study 08/19/2022-titrated CPAP of 14. AHI on the study was 36.6 with SpO2 low of 60% 09/10/2022 titrated to BiPAP 21/17 with no requirement for supplemental oxygen  Recent NSTEMI, aortic stenosis Bioprosthetic AVR and CABG x 2 on 3/22  Continues to feel better Noticed some swelling of his legs, fatigued  Feels he is tolerating the BiPAP better at present  Previous history of sleep apnea for which he tried CPAP, was not able to tolerate it so stopped using it after a few years, was wearing oxygen at night  Reformed smoker  Retired, drinks 2 cups of coffee in the morning  Still does not feel like he is sleeping optimally  He did complete cardiac rehab Does try to stay active, golfs once a week, goes to the gym once a week  Outpatient Encounter Medications as of 01/06/2023  Medication Sig   aspirin EC 81 MG tablet Take 81 mg by mouth at bedtime.   Cholecalciferol 125 MCG (5000 UT) TABS Take 1 tablet by mouth daily.   clopidogrel (PLAVIX) 75 MG tablet TAKE 1 TABLET BY MOUTH AT  BEDTIME   ezetimibe (ZETIA) 10 MG tablet Take 10 mg by mouth at bedtime.   furosemide (LASIX) 20 MG tablet Take 1 tablet (20 mg total) by mouth daily.   gabapentin (NEURONTIN) 100 MG capsule Take 1 capsule (100 mg total) by mouth 2 (two) times daily.   olmesartan (BENICAR) 20 MG tablet Take 20 mg by mouth daily.   rosuvastatin (CRESTOR) 20 MG tablet Take 1 tablet (20 mg total) by mouth daily.   nitroGLYCERIN (NITROSTAT) 0.4 MG SL tablet Place 1 tablet (0.4 mg total) under the tongue every 5 (five) minutes as needed for chest pain.   No facility-administered encounter  medications on file as of 01/06/2023.    Allergies as of 01/06/2023 - Review Complete 01/06/2023  Allergen Reaction Noted   Penicillins  07/03/2022   Statins Other (See Comments)    Isosorbide Other (See Comments) 03/08/2018   Latex Rash 09/07/2017    Past Medical History:  Diagnosis Date   Aortic stenosis 08/10/2015   Bilateral carotid artery disease (HCC) 06/2022   mild bilaterally   Bilateral leg edema 08/22/2015   Bradycardia 06/01/2017   Cellulitis of left lower extremity 04/29/2017   Coronary artery disease involving native coronary artery 10/22/2017   DOE (dyspnea on exertion) 05/04/2017   Essential hypertension 08/13/2015   Heart murmur 04/29/2017   Kissing, osteophytes 08/22/2015   LBBB (left bundle branch block) 08/22/2015   Lipid disorder 11/30/2017   Lumbar facet joint pain 08/22/2015   Medicare annual wellness visit, subsequent 06/02/2016   Morbid obesity (HCC) 10/22/2017   Murmur 04/29/2017   Myopathy 09/02/2019   Formatting of this note might be different from the original. Secondary to high freq statin use.   Need for vaccination for Strep pneumoniae 03/27/2017   Non-ST elevation myocardial infarction (NSTEMI), subendocardial infarction, subsequent episode of care (HCC) 11/30/2017   Obesity, Class II, BMI 35-39.9 08/10/2015   OSA on CPAP 12/02/2017   Postoperative atrial fibrillation (HCC) 07/11/2022   Pure hypercholesterolemia 05/24/2015   Seborrheic keratoses  03/27/2017   Overview:  Of the right forehead.   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   Venous insufficiency 08/10/2015    Past Surgical History:  Procedure Laterality Date   AORTIC VALVE REPLACEMENT N/A 07/11/2022   Procedure: AORTIC VALVE REPLACEMENT (AVR) USING INSPIRIS VALVE SIZE ;  Surgeon: Eugenio Hoes, MD;  Location: [facility] OR;  Service: Open Heart Surgery;  Laterality: N/A;    CARDIAC CATHETERIZATION     CHOLECYSTECTOMY     CORONARY ANGIOPLASTY     CORONARY ARTERY BYPASS GRAFT N/A 07/11/2022   Procedure: CORONARY ARTERY BYPASS GRAFTING (CABG) X TWO, USING LEFT INTERNAL MAMMARY AND RIGHT LEG GREATER SAPHENOUS VEIN HARVESTED ENDOSCOPICALLY;  Surgeon: Eugenio Hoes, MD;  Location: [facility] OR;  Service: Open Heart Surgery;  Laterality: N/A;   CORONARY ATHERECTOMY N/A 08/16/2019   Procedure: CORONARY ATHERECTOMY;  Surgeon: Corky Crafts, MD;  Location: [facility] INVASIVE CV LAB;  Service: Cardiovascular;  Laterality: N/A;   CORONARY BALLOON ANGIOPLASTY N/A 07/28/2019   Procedure: CORONARY BALLOON ANGIOPLASTY;  Surgeon: Corky Crafts, MD;  Location: [facility] INVASIVE CV LAB;  Service: Cardiovascular;  Laterality: N/A;   CORONARY ULTRASOUND/IVUS N/A 07/28/2019   Procedure: Intravascular Ultrasound/IVUS;  Surgeon: Corky Crafts, MD;  Location: [facility] INVASIVE CV LAB;  Service: Cardiovascular;  Laterality: N/A;   CORONARY ULTRASOUND/IVUS N/A 08/16/2019   Procedure: Intravascular Ultrasound/IVUS;  Surgeon: Corky Crafts, MD;  Location: [facility] INVASIVE CV LAB;  Service: Cardiovascular;  Laterality: N/A;   CORONARY ULTRASOUND/IVUS N/A 07/04/2022   Procedure: Intravascular Ultrasound/IVUS;  Surgeon: Kathleene Hazel, MD;  Location: [facility] INVASIVE CV LAB;  Service: Cardiovascular;  Laterality: N/A;   ESOPHAGOGASTRODUODENOSCOPY     LEFT HEART CATH AND CORONARY ANGIOGRAPHY N/A 07/28/2019   Procedure: LEFT HEART CATH AND CORONARY ANGIOGRAPHY;  Surgeon: Corky Crafts, MD;  Location: [facility] INVASIVE CV LAB;  Service: Cardiovascular;  Laterality: N/A;   LEFT HEART CATH AND CORONARY ANGIOGRAPHY N/A 08/16/2019   Procedure: LEFT HEART CATH AND CORONARY ANGIOGRAPHY;  Surgeon: Corky Crafts, MD;  Location: [facility] INVASIVE CV LAB;  Service: Cardiovascular;  Laterality: N/A;   RIGHT/LEFT HEART CATH AND CORONARY ANGIOGRAPHY N/A 07/04/2022   Procedure: RIGHT/LEFT HEART CATH AND CORONARY  ANGIOGRAPHY;  Surgeon: Kathleene Hazel, MD;  Location: [facility] INVASIVE CV LAB;  Service: Cardiovascular;  Laterality: N/A;   TEE WITHOUT CARDIOVERSION N/A 07/11/2022   Procedure: TRANSESOPHAGEAL ECHOCARDIOGRAM;  Surgeon: Eugenio Hoes, MD;  Location: [facility] OR;  Service: Open Heart Surgery;  Laterality: N/A;    Family History  Problem Relation Age of Onset   Cancer Mother    Diabetes Sister    Diabetes Brother    Hypertension Brother     Social History   Socioeconomic History   Marital status: Married    Spouse name: Not on file   Number of children: Not on file   Years of education: Not on file   Highest education level: Not on file  Occupational History   Not on file  Tobacco Use   Smoking status: Former    Current packs/day: 0.00    Types: Cigarettes    Quit date: 1973    Years since quitting: 51.7    Passive exposure: Past   Smokeless tobacco: Never  Vaping Use   Vaping status: Never Used  Substance and Sexual Activity   Alcohol use: Yes    Alcohol/week: 7.0 standard drinks of alcohol  Types: 7 Glasses of wine per week    Comment: per week   Drug use: No   Sexual activity: Not on file  Other Topics Concern   Not on file  Social History Narrative   Not on file   Social Determinants of Health   Financial Resource Strain: Not on file  Food Insecurity: No Food Insecurity (07/04/2022)   Hunger Vital Sign    Worried About Running Out of Food in the Last Year: Never true    Ran Out of Food in the Last Year: Never true  Transportation Needs: No Transportation Needs (07/04/2022)   PRAPARE - Administrator, Civil Service (Medical): No    Lack of Transportation (Non-Medical): No  Physical Activity: Not on file  Stress: Not on file  Social Connections: Not on file  Intimate Partner Violence: Not At Risk (07/04/2022)   Humiliation, Afraid, Rape, and Kick questionnaire    Fear of Current or Ex-Partner: No    Emotionally Abused: No    Physically Abused: No     Sexually Abused: No    Review of Systems  Constitutional:  Positive for fatigue.  Respiratory:  Positive for apnea.   Psychiatric/Behavioral:  Positive for sleep disturbance.     Vitals:   01/06/23 1140  BP: 130/70  Pulse: (!) 52  SpO2: 98%     Physical Exam Constitutional:      Appearance: He is obese.  HENT:     Head: Normocephalic.     Mouth/Throat:     Mouth: Mucous membranes are moist.  Eyes:     General: No scleral icterus. Cardiovascular:     Rate and Rhythm: Normal rate and regular rhythm.     Heart sounds: No murmur heard.    No friction rub.  Pulmonary:     Effort: No respiratory distress.     Breath sounds: No stridor. No wheezing or rhonchi.  Musculoskeletal:     Cervical back: No rigidity or tenderness.  Neurological:     Mental Status: He is alert.  Psychiatric:        Mood and Affect: Mood normal.    Data Reviewed: Sleep study reviewed showing AHI of 36.6, oxygen nadir of 60%  Titration study to pressure of 21/17  Compliance shows good compliance, average use of 6 hours 10 minutes The auto 2 21/17 with a pressure support of 4  Assessment:  Severe obstructive sleep apnea on BiPAP therapy -Compliance data does show optimal treatment with an AHI of 1.0 does have some mask leak  Persistent fatigue, leg swelling, weight gain -Has echo pending -Has cardiology follow-up pending  Plan/Recommendations: Continue BiPAP nightly  Follow-up in about 8 weeks with compliance data  May need to make pressure changes at the time  Encouraged to follow-up with his scheduled echocardiogram  I do believe BiPAP is working well at present May need pressure changes to continue to tolerated  Encouraged to get at least 7 hours of sleep at night  Call with significant concerns   Virl Diamond MD [facility] 01/06/2023, 12:50 PM  CC: Oletha Blend, MD

## 2023-01-19 NOTE — Telephone Encounter (Signed)
Pt had a f/u visit with Dr.O

## 2023-01-21 ENCOUNTER — Ambulatory Visit: Payer: Medicare Other

## 2023-01-21 DIAGNOSIS — I480 Paroxysmal atrial fibrillation: Secondary | ICD-10-CM

## 2023-01-21 DIAGNOSIS — I25118 Atherosclerotic heart disease of native coronary artery with other forms of angina pectoris: Secondary | ICD-10-CM | POA: Diagnosis not present

## 2023-01-21 DIAGNOSIS — I350 Nonrheumatic aortic (valve) stenosis: Secondary | ICD-10-CM

## 2023-01-21 DIAGNOSIS — I351 Nonrheumatic aortic (valve) insufficiency: Secondary | ICD-10-CM | POA: Diagnosis not present

## 2023-01-21 LAB — ECHOCARDIOGRAM COMPLETE
AR max vel: 1.26 cm2
AV Area VTI: 1.29 cm2
AV Area mean vel: 1.13 cm2
AV Mean grad: 18.4 mm[Hg]
AV Peak grad: 31.5 mm[Hg]
Ao pk vel: 2.81 m/s
Area-P 1/2: 2.44 cm2
MV M vel: 4.82 m/s
MV Peak grad: 92.9 mm[Hg]
S' Lateral: 5.4 cm

## 2023-02-06 ENCOUNTER — Other Ambulatory Visit: Payer: Self-pay | Admitting: Cardiology

## 2023-04-29 DIAGNOSIS — I517 Cardiomegaly: Secondary | ICD-10-CM

## 2023-04-29 DIAGNOSIS — R600 Localized edema: Secondary | ICD-10-CM | POA: Insufficient documentation

## 2023-04-29 HISTORY — DX: Cardiomegaly: I51.7

## 2023-06-01 ENCOUNTER — Encounter: Payer: Self-pay | Admitting: Cardiology

## 2023-06-02 NOTE — Progress Notes (Unsigned)
Cardiology Office Note:    Date:  06/03/2023   ID:  Craig Lawson, DOB 1945-11-11, MRN [id number]  PCP:  Oletha Blend, MD  Cardiologist:  Norman Herrlich, MD    Referring MD: Oletha Blend, MD    ASSESSMENT:    1. Coronary artery disease of native artery of native heart with stable angina pectoris (HCC)   2. Hx of CABG   3. S/P AVR (aortic valve replacement)   4. Postoperative atrial fibrillation (HCC)   5. Essential hypertension   6. Mixed hyperlipidemia   7. Statin intolerance    PLAN:    In order of problems listed above:  He has done well recovering from bypass start surgery no anginal discomfort continue his long-term dual platelet combined with rosuvastatin and Zetia and beta-blocker Stable after valve replacement no signs of dysfunction No recurrence of atrial fibrillation not anticoagulated Well-controlled with diuretic and ARB renal function potassium normal Continue his current lipid-lowering treatment labs followed with his PCP   Next appointment: Plan to see him in 1 year   Medication Adjustments/Labs and Tests Ordered: Current medicines are reviewed at length with the patient today.  Concerns regarding medicines are outlined above.  No orders of the defined types were placed in this encounter.  No orders of the defined types were placed in this encounter.    History of Present Illness:    Craig Lawson is a 78 y.o. male with a hx of CAD with CABG and surgical AVR 07/11/2022 with a 25 mm Inspira's pericardial valve a postoperative atrial fibrillation mild dilation of the ascending aorta left bundle branch block hypertension hyperlipidemia statin intolerance carotid artery disease obstructive sleep apnea and CPAP intolerance ast seen 10/22/2022.   Compliance with diet, lifestyle and medications: Yes  He continues to do well recovering from his surgery CABG and aortic valve replacement approaching 1 year No cardiovascular symptoms of edema shortness of  breath orthopnea chest pain palpitation or syncope Has had difficulty tolerating statins but does tolerate the combination of rosuvastatin along with Zetia.  With edema he is steadily increased his diuretic from 20 to 80 mg a day with resolution.  I told him as a high-dose and asked him to reduce back to 60 mg/day Past Medical History:  Diagnosis Date   Acquired hypothyroidism 04/17/2022   Acute respiratory failure with hypoxia (HCC) 07/15/2022   Aortic stenosis 08/10/2015   Aortic stenosis, mild 08/10/2015   Bilateral carotid artery disease (HCC) 06/2022   mild bilaterally   Bilateral leg edema 08/22/2015   Bradycardia 06/01/2017   CAD S/P percutaneous coronary angioplasty 07/28/2019   Cellulitis of left lower extremity 04/29/2017   Coronary artery disease involving native coronary artery 10/22/2017   DOE (dyspnea on exertion) 05/04/2017   Erectile dysfunction due to arterial insufficiency 03/06/2020   Essential hypertension 08/13/2015   Heart murmur 04/29/2017   History of aortic valve replacement with porcine valve 10/29/2022   06/2022     History of transient ischemic attack (TIA) 10/30/2017   Hyponatremia 07/16/2022   Kissing, osteophytes 08/22/2015   LBBB (left bundle branch block) 08/22/2015   Lipid disorder 11/30/2017   Lumbar facet joint pain 08/22/2015   Medicare annual wellness visit, subsequent 06/02/2016   Mild aortic regurgitation 08/10/2015   Mild aortic stenosis 06/01/2017   Mixed hyperlipidemia 12/04/2017   Morbid obesity (HCC) 10/22/2017   Murmur 04/29/2017   Myopathy 09/02/2019   Formatting of this note might be different from the original. Secondary to  high freq statin use.   Need for vaccination for Strep pneumoniae 03/27/2017   Nocturnal hypoxia 08/19/2022   Non-ST elevation myocardial infarction (NSTEMI), subendocardial infarction, subsequent episode of care (HCC) 11/30/2017   NSTEMI (non-ST elevated myocardial infarction) (HCC) 07/03/2022   Obesity,  Class II, BMI 35-39.9 08/10/2015   On statin therapy 04/17/2022   OSA on CPAP 12/02/2017   Pleural effusion 07/31/2022   Pneumonia of left lower lobe due to infectious organism 07/15/2022   Postoperative atrial fibrillation (HCC) 07/11/2022   Pure hypercholesterolemia 05/24/2015   S/P CABG x 2 07/11/2022   Seborrheic keratoses 03/27/2017   Overview:  Of the right forehead.   Severe aortic stenosis 07/04/2022   Severe obstructive sleep apnea 12/02/2017   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   Troponin level elevated 07/15/2022   Venous insufficiency 08/10/2015   Vitamin D deficiency 09/27/2021    Current Medications: Current Meds  Medication Sig   aspirin EC 81 MG tablet Take 81 mg by mouth at bedtime.   Cholecalciferol 125 MCG (5000 UT) TABS Take 1 tablet by mouth daily.   clopidogrel (PLAVIX) 75 MG tablet Take 1 tablet (75 mg total) by mouth at bedtime.   ezetimibe (ZETIA) 10 MG tablet Take 10 mg by mouth at bedtime.   furosemide (LASIX) 20 MG tablet Take 1 tablet (20 mg total) by mouth daily. (Patient taking differently: Take 80 mg by mouth daily.)   losartan (COZAAR) 25 MG tablet Take 1 tablet by mouth daily.   metoprolol tartrate (LOPRESSOR) 25 MG tablet Take 25 mg by mouth 2 (two) times daily.   rosuvastatin (CRESTOR) 20 MG tablet Take 1 tablet (20 mg total) by mouth daily.      EKGs/Labs/Other Studies Reviewed:    The following studies were reviewed today:  Cardiac Studies & Procedures   ______________________________________________________________________________________________ CARDIAC CATHETERIZATION  CARDIAC CATHETERIZATION 07/04/2022  Narrative   Dist RCA lesion is 50% stenosed.   2nd Diag lesion is 25% stenosed.   1st Diag lesion is 75% stenosed.   RPDA lesion is 30% stenosed.   RPAV lesion is 60% stenosed.   Prox LAD to Mid LAD lesion is 20%  stenosed.   Ost Cx to Prox Cx lesion is 90% stenosed.   Mid LM to Ost LAD lesion is 80% stenosed.   Ost LAD lesion is 80% stenosed.   Previously placed 1st Mrg stent of unknown type is  widely patent.  Severe distal left main stenosis (IVUS minimum lumen area of 4.0 mm2) Severe ostial LAD stenosis (IVUD minimum lumen area of 3.5 mm2). Patent mid LAD stent. Severe ostial Circumflex stenosis. Patent obtuse marginal stent Large dominant RCA with moderate distal stenosis, patent PDA stent, moderate posterolateral artery stenosis. Severe aortic stenosis by echo (Cath data: mean gradient 32.8 mmHg, peak to peak gradient 42 mmHg)  Recommendations: Severe distal left main stenosis involving both the LAD and Circumflex, confirmed with IVUS imaging. Severe aortic stenosis. He will need a CT surgery consult for CABG and AVR. He has been on Plavix so I will begin holding that tonight. Repeat echo tomorrow. 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 He will also need Plavix washout before surgery. Resume IV heparin 8 hours post sheath pull. Will call CT surgery to see him tomorrow.  Findings Coronary Findings Diagnostic  Dominance: Right  Left Main  Mid LM to Ost LAD lesion is 80% stenosed.  Left Anterior Descending Ost LAD lesion is 80% stenosed. Prox LAD to Mid LAD lesion is 20% stenosed. The lesion was previously treated using a drug eluting stent over 2 years ago.  First Diagonal Branch 1st Diag lesion is 75% stenosed.  Second Diagonal Branch 2nd Diag lesion is 25% stenosed. The lesion was previously treated .  Left Circumflex Ost Cx to Prox Cx lesion is 90% stenosed.  First Obtuse Marginal Branch Previously placed 1st Mrg stent of unknown type is  widely patent.  Right Coronary Artery Dist RCA lesion is 50% stenosed.  Right Posterior Descending Artery RPDA lesion is 30% stenosed. The lesion was previously treated using a drug  eluting stent over 2 years ago.  Right Posterior Atrioventricular Artery RPAV lesion is 60% stenosed.  Intervention  No interventions have been documented.   CARDIAC CATHETERIZATION  CARDIAC CATHETERIZATION 08/16/2019  Narrative  2nd Diag lesion is 80% stenosed.  Balloon angioplasty was performed using a BALLOON SAPPHIRE 2.0X12.  Post intervention, there is a 25% residual stenosis.  Prox LAD to Mid LAD lesion is 70% stenosed.  A drug-eluting stent was successfully placed using a SYNERGY XD 2.75X38, after orbital atherectomy, dilated to 3.25 mm. Stent size optimized with IVUS.  Post intervention, there is a 0% residual stenosis.  Ost Cx to Prox Cx lesion is 50% stenosed.  Dist RCA lesion is 50% stenosed.  RPDA stent that was treated a few weeks ago was widely patent.  1st Diag lesion is 75% stenosed.  LV end diastolic pressure is normal.  There is mild aortic valve stenosis. Mild gradient noted on pullback.  Continue aggressive secondary prevention.  Would recommend clopidogrel monotherapy after 6 months of DAPT given his extensive disease.  Given the residual pain from the jailed diagonals, will watch overnight and treat with pain medication.  Findings Coronary Findings Diagnostic  Dominance: Right  Left Anterior Descending Prox LAD to Mid LAD lesion is 70% stenosed. The lesion was previously treated. IVUS would not cross  First Diagonal Branch 1st Diag lesion is 75% stenosed.  Second Diagonal Branch 2nd Diag lesion is 80% stenosed.  Left Circumflex Ost Cx to Prox Cx lesion is 50% stenosed.  Right Coronary Artery Dist RCA lesion is 50% stenosed.  Right Posterior Descending Artery Non-stenotic RPDA lesion. stent appeared underexpanded.  Vessel appeared to be 3.0 mm in diameter.  Intervention  Prox LAD to Mid LAD lesion Atherectomy CATH LAUNCHER 6FR EBU 3.75 guide catheter was inserted. WIRE VIPERWIRE COR FLEX TIP guidewire was used to cross lesion.  Orbital atherectomy was performed using a CROWN DIAMONDBACK CLASSIC 1.25. Stent CATH LAUNCHER 6FR EBU 3.75 guide catheter was inserted. Lesion crossed with guidewire using a WIRE ASAHI PROWATER 180CM. Pre-stent angioplasty was performed using a BALLOON SAPPHIRE 2.5X20. A drug-eluting stent was successfully placed using a SYNERGY XD 2.75X38. Stent strut is well apposed. Post-stent angioplasty was performed using a BALLOON SAPPHIRE [place] 3.25X15. Pre stent IVUS after athetectomy showed one focal area of concentric calcium that was treated with the 2.5 balloon.   THere was difficulty delivering the stent requiring buddy wires.Post stent IVUS showed the stent was well apposed, but there was significant underlying calcium that affected the shape of the stent. Post-Intervention Lesion Assessment The intervention was successful. Pre-interventional TIMI flow is 3. Post-intervention TIMI flow is 3. No complications occurred at this lesion. Ultrasound (IVUS) was performed on the lesion post PCI. Stent well apposed. There is a 0% residual  stenosis post intervention.  2nd Diag lesion Angioplasty CATH LAUNCHER 6FR EBU 3.75 guide catheter was inserted. WIRE HI TORQ BMW 190CM guidewire used to cross lesion. Balloon angioplasty was performed using a BALLOON SAPPHIRE 2.0X12. Maximum pressure: 8 atm. Post-Intervention Lesion Assessment The intervention was successful. Pre-interventional TIMI flow is 3. Post-intervention TIMI flow is 3. No complications occurred at this lesion. There is a 25% residual stenosis post intervention.   STRESS TESTS  MYOCARDIAL PERFUSION IMAGING 12/07/2018  Narrative  The left ventricular ejection fraction is mildly decreased (45-54%).  Nuclear stress EF: 51%.  There was no ST segment deviation noted during stress.  The study is normal.  This is a low risk study.   ECHOCARDIOGRAM  ECHOCARDIOGRAM COMPLETE 01/21/2023  Narrative ECHOCARDIOGRAM REPORT    Patient Name:   Craig Lawson Date of Exam: 01/21/2023 Medical Rec #:  [id number]     Height:       69.0 in Accession #:    [id number]    Weight:       263.8 lb Date of Birth:  08-06-1945      BSA:          2.324 m Patient Age:    77 years      BP:           130/70 mmHg Patient Gender: M             HR:           46 bpm. Exam Location:  [facility]  Procedure: 2D Echo, Cardiac Doppler, Color Doppler and Strain Analysis  Indications:    Mild aortic regurgitation [I35.1 (ICD-10-CM)]; Paroxysmal atrial fibrillation (HCC) [I48.0 (ICD-10-CM)]; Coronary artery disease of native artery of native heart with stable angina pectoris (HCC) [I25.118 (ICD-10-CM)]; Nonrheumatic aortic valve stenosis [I35.0 (ICD-10-CM)]  History:        Patient has prior history of Echocardiogram examinations, most recent 08/22/2022. Previous Myocardial Infarction and CAD, Prior CABG, TIA, Aortic Valve Disease, Arrythmias:LBBB; Risk Factors:Hypertension and Dyslipidemia. Aortic Valve: 25 mm Inspiris valve is present in the aortic position. Procedure Date: 07/11/2022.  Sonographer:    Margreta Journey RDCS Referring Phys: [id number] Shannia Jacuinde J Halina Asano  IMPRESSIONS   1. Left ventricular ejection fraction, by estimation, is 50 to 55%. The left ventricle has low normal function. Left ventricular endocardial border not optimally defined to evaluate regional wall motion. The left ventricular internal cavity size was moderately dilated. Left ventricular diastolic parameters are consistent with Grade II diastolic dysfunction (pseudonormalization). The average left ventricular global longitudinal strain is -17.0 %. The global longitudinal strain is abnormal. 2. Right ventricular systolic function is normal. The right ventricular size is normal. Tricuspid regurgitation signal is inadequate for assessing PA pressure. 3. The aortic valve was not well visualized. There is a 25 mm Inspiris valve present in the aortic position. Procedure Date: 07/11/2022. Leaflets appear  moderately thickened, Vmax measures 2.81 m/s, mean gradient 18mm Hg and dimensionless Index 0.4. No Aortic insufficiency. 4. There is mild dilatation of the ascending aorta, measuring 40 mm. 5. The mitral valve was not well visualized. Mild mitral valve regurgitation. No evidence of mitral stenosis. 6. The inferior vena cava is normal in size with greater than 50% respiratory variability, suggesting right atrial pressure of 3 mmHg. 7. Rhythm strip during this exam demonstrates sinus bradycardia. 8. Pulmonic valve regurgitation Not visualized.  Comparison(s): A prior study was performed on 09/18/2022. Similar findings noted previously; MR was not reported;.  FINDINGS Left Ventricle: Left ventricular ejection fraction,  by estimation, is 50 to 55%. The left ventricle has low normal function. Left ventricular endocardial border not optimally defined to evaluate regional wall motion. The average left ventricular global longitudinal strain is -17.0 %. The global longitudinal strain is abnormal. The left ventricular internal cavity size was moderately dilated. There is no left ventricular hypertrophy. Left ventricular diastolic parameters are consistent with Grade II diastolic dysfunction (pseudonormalization).  Right Ventricle: The right ventricular size is normal. Right vetricular wall thickness was not well visualized. Right ventricular systolic function is normal. Tricuspid regurgitation signal is inadequate for assessing PA pressure.  Left Atrium: Left atrial size was normal in size.  Right Atrium: Right atrial size was normal in size.  Pericardium: There is no evidence of pericardial effusion.  Mitral Valve: The mitral valve was not well visualized. Mild mitral annular calcification. Mild mitral valve regurgitation. No evidence of mitral valve stenosis.  Tricuspid Valve: The tricuspid valve is not well visualized. Tricuspid valve regurgitation is mild.  Aortic Valve: The aortic valve was  not well visualized. Aortic valve regurgitation is not visualized. Aortic valve mean gradient measures 18.4 mmHg. Aortic valve peak gradient measures 31.5 mmHg. Aortic valve area, by VTI measures 1.29 cm. There is a 25 mm Inspiris valve present in the aortic position. Procedure Date: 07/11/2022.  Pulmonic Valve: The pulmonic valve was not well visualized. Pulmonic valve regurgitation Not visualized.  Aorta: The aortic root was not well visualized and the aortic root is normal in size and structure. There is mild dilatation of the ascending aorta, measuring 40 mm.  Venous: The inferior vena cava is normal in size with greater than 50% respiratory variability, suggesting right atrial pressure of 3 mmHg.  IAS/Shunts: The interatrial septum was not well visualized.  EKG: Rhythm strip during this exam demonstrates sinus bradycardia.   LEFT VENTRICLE PLAX 2D LVIDd:         6.70 cm   Diastology LVIDs:         5.40 cm   LV e' medial:    4.12 cm/s LV PW:         1.00 cm   LV E/e' medial:  28.0 LV IVS:        1.10 cm   LV e' lateral:   6.79 cm/s LVOT diam:     2.00 cm   LV E/e' lateral: 17.0 LV SV:         83 LV SV Index:   36        2D Longitudinal Strain LVOT Area:     3.14 cm  2D Strain GLS Avg:     -17.0 %   RIGHT VENTRICLE             IVC RV Basal diam:  3.90 cm     IVC diam: 2.10 cm RV Mid diam:    3.40 cm RV S prime:     10.20 cm/s TAPSE (M-mode): 2.5 cm  LEFT ATRIUM             Index        RIGHT ATRIUM           Index LA diam:        5.60 cm 2.41 cm/m   RA Area:     15.80 cm LA Vol (A2C):   71.3 ml 30.68 ml/m  RA Volume:   39.90 ml  17.17 ml/m LA Vol (A4C):   78.3 ml 33.69 ml/m LA Biplane Vol: 77.2 ml 33.22 ml/m AORTIC VALVE AV Area (Vmax):  1.26 cm AV Area (Vmean):   1.13 cm AV Area (VTI):     1.29 cm AV Vmax:           280.80 cm/s AV Vmean:          198.000 cm/s AV VTI:            0.646 m AV Peak Grad:      31.5 mmHg AV Mean Grad:      18.4 mmHg LVOT Vmax:          113.00 cm/s LVOT Vmean:        71.133 cm/s LVOT VTI:          0.265 m LVOT/AV VTI ratio: 0.41  AORTA Ao Root diam: 3.70 cm Ao Asc diam:  4.00 cm Ao Desc diam: 2.20 cm  MITRAL VALVE MV Area (PHT): 2.44 cm     SHUNTS MV Decel Time: 312 msec     Systemic VTI:  0.27 m MR Peak grad: 92.9 mmHg     Systemic Diam: 2.00 cm MR Vmax:      482.00 cm/s MV E velocity: 115.50 cm/s MV A velocity: 88.55 cm/s MV E/A ratio:  1.30  Craig Lawson Electronically signed by Vern Claude reddy Lawson Signature Date/Time: 01/21/2023/4:34:39 PM    Final   TEE  ECHO INTRAOPERATIVE TEE 07/11/2022  Narrative *INTRAOPERATIVE TRANSESOPHAGEAL REPORT *    Patient Name:   Craig Lawson Date of Exam: 07/11/2022 Medical Rec #:  [id number]     Height:       69.0 in Accession #:    [id number]    Weight:       249.6 lb Date of Birth:  30-Oct-1945      BSA:          2.27 m Patient Age:    77 years      BP:           134/69 mmHg Patient Gender: M             HR:           59 bpm. Exam Location:  Inpatient  Transesophogeal exam was perform intraoperatively during surgical procedure. Patient was closely monitored under general anesthesia during the entirety of examination.  Indications:     I25.10 CAD Performing Phys: [id number] Eugenio Hoes  Complications: No known complications during this procedure. POST-OP IMPRESSIONS _ Left Ventricle: has mildly reduced systolic function, with an ejection fraction of 50%. The wall motion is septal dyssynchrony from pacing, inferior wall mildly hypokinetic relative to anterior wall. _ Right Ventricle: normal function. The cavity was normal. The wall motion is normal. _ Aorta: there is no dissection present in the aorta. 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 The gradient recorded across the prosthetic valve is within the expected range, measuring 4 mmHg. _ Mitral Valve: The mitral valve  appears unchanged from pre-bypass. _ Tricuspid Valve: Mild regurgitation post repair.  PRE-OP FINDINGS Left Ventricle: The left ventricle has low normal systolic function, with an ejection fraction of 50-55%. The cavity size was normal. No evidence of left ventricular regional wall motion abnormalities. There is moderate concentric left ventricular hypertrophy.   Right Ventricle: The right ventricle has normal systolic function. The cavity was normal. There is no increase in right ventricular wall thickness.  Left Atrium: Left atrial size was not assessed. No left atrial/left atrial appendage thrombus was detected. Left atrial appendage velocity is normal at greater  than 40 cm/s.  Right Atrium: Right atrial size was not assessed.  Interatrial Septum: No atrial level shunt detected by color flow Doppler. There is no evidence of a patent foramen ovale.  Pericardium: There is no evidence of pericardial effusion.  Mitral Valve: The mitral valve is normal in structure. Mitral valve regurgitation is mild by color flow Doppler. The MR jet is centrally-directed. There is No evidence of mitral stenosis.  Tricuspid Valve: The tricuspid valve was normal in structure. Tricuspid valve regurgitation was not visualized by color flow Doppler. No evidence of tricuspid stenosis is present.  Aortic Valve: The aortic valve is tricuspid Aortic valve regurgitation is mild by color flow Doppler. The jet is posteriorly-directed. There is moderate stenosis of the aortic valve, with a calculated valve area of 1.38 cm. There is severe thickening and severe calcifcation present on the aortic valve left coronary and non-coronary cusps with severely decreased mobility and there is moderate thickening and mild calcification present on the aortic valve right coronary cusp with mildly decreased mobility.  Pulmonic Valve: The pulmonic valve was normal in structure, with normal. No evidence of pumonic stenosis. Pulmonic  valve regurgitation is not visualized by color flow Doppler.   Aorta: There is evidence of plaque in the descending aorta; Grade I, measuring 1-70mm in size. There is evidence of a dissection in the none.  Shunts: There is no evidence of an atrial septal defect.  +--------------+--------++ LEFT VENTRICLE         +--------------+--------++ PLAX 2D                +--------------+--------++ LVOT diam:    2.20 cm  +--------------+--------++ LVOT Area:    3.80 cm +--------------+--------++                        +--------------+--------++  +------------------+------------++ AORTIC VALVE                   +------------------+------------++ AV Area (Vmax):   1.55 cm     +------------------+------------++ AV Area (Vmean):  1.44 cm     +------------------+------------++ AV Area (VTI):    1.38 cm     +------------------+------------++ AV Vmax:          286.33 cm/s  +------------------+------------++ AV Vmean:         198.967 cm/s +------------------+------------++ AV VTI:           0.610 m      +------------------+------------++ AV Peak Grad:     32.8 mmHg    +------------------+------------++ AV Mean Grad:     20.7 mmHg    +------------------+------------++ LVOT Vmax:        116.85 cm/s  +------------------+------------++ LVOT Vmean:       75.550 cm/s  +------------------+------------++ LVOT VTI:         0.221 m      +------------------+------------++ LVOT/AV VTI ratio:0.36         +------------------+------------++  +-------------+---------++ MITRAL VALVE           +--------------+-------+ +-------------+---------++ SHUNTS                MV Peak grad:4.1 mmHg  +--------------+-------+ +-------------+---------++ Systemic VTI: 0.22 m  MV Mean grad:1.0 mmHg  +--------------+-------+ +-------------+---------++ Systemic Diam:2.20 cm MV Vmax:     1.01 m/s   +--------------+-------+ +-------------+---------++ MV Vmean:    45.1 cm/s +-------------+---------++ MV VTI:      0.25 m    +-------------+---------++   Val Eagle MD Electronically signed by Cristal Deer  Moser MD Signature Date/Time: 07/20/2022/8:52:48 AM    Final  MONITORS  LONG TERM MONITOR (3-14 DAYS) 10/25/2018  Narrative A ZIO monitor was applied for 3 days to assess palpitation following MI beginning 10/12/2018.  Rhythm throughout was sinus with bundle branch block minimum average and maximum heart rates of 41, 55 and 125 bpm.  There were no pauses of 3 seconds or greater and no episodes of sinus node or AV nodal block.  There were 3 triggered and 3 diary events predominantly associated with PVCs.  Ventricular ectopy was rare with isolated PVCs.  Supraventricular ectopy was rare there were no episodes of atrial fibrillation or flutter.  There were 3 brief episodes of runs of atrial premature contractions the longest 6 complexes at a rate of 113 bpm.   Conclusion symptomatic PVCs, however PVC burden is low and the occurrence of palpitation is infrequent       ______________________________________________________________________________________________          Recent Labs: 07/13/2022: B Natriuretic Peptide 235.1 07/16/2022: Magnesium 2.3 08/11/2022: ALT 16; Hemoglobin 12.8; NT-Pro BNP 663; Platelets 224; TSH 6.890 12/19/2022: BUN 24; Creatinine, Ser 1.12; Potassium 4.6; Sodium 142  Recent Lipid Panel    Component Value Date/Time   CHOL 184 07/04/2022 0258   CHOL 189 07/05/2019 0853   TRIG 196 (H) 07/04/2022 0258   HDL 37 (L) 07/04/2022 0258   HDL 43 07/05/2019 0853   CHOLHDL 5.0 07/04/2022 0258   VLDL 39 07/04/2022 0258   LDLCALC 108 (H) 07/04/2022 0258   LDLCALC 118 (H) 07/05/2019 0853    Physical Exam:    VS:  BP (!) 124/58   Pulse (!) 46   Ht 5\' 9"  (1.753 m)   Wt 258 lb 12.8 oz (117.4 kg)   SpO2 95%   BMI 38.22 kg/m     Wt  Readings from Last 3 Encounters:  06/03/23 258 lb 12.8 oz (117.4 kg)  01/06/23 263 lb 12.8 oz (119.7 kg)  11/05/22 255 lb 3.2 oz (115.8 kg)     GEN:  Well nourished, well developed in no acute distress HEENT: Normal NECK: No JVD; No carotid bruits LYMPHATICS: No lymphadenopathy CARDIAC: Excuse me RRR, no murmurs, rubs, gallops RESPIRATORY:  Clear to auscultation without rales, wheezing or rhonchi  ABDOMEN: Soft, non-tender, non-distended MUSCULOSKELETAL:  No edema; No deformity  SKIN: Warm and dry NEUROLOGIC:  Alert and oriented x 3 PSYCHIATRIC:  Normal affect    Signed, Norman Herrlich, MD  06/03/2023 4:31 PM    [facility]

## 2023-06-03 ENCOUNTER — Encounter: Payer: Self-pay | Admitting: Cardiology

## 2023-06-03 ENCOUNTER — Ambulatory Visit: Payer: Medicare Other | Attending: Cardiology | Admitting: Cardiology

## 2023-06-03 VITALS — BP 124/58 | HR 46 | Ht 69.0 in | Wt 258.8 lb

## 2023-06-03 DIAGNOSIS — Z789 Other specified health status: Secondary | ICD-10-CM

## 2023-06-03 DIAGNOSIS — Z952 Presence of prosthetic heart valve: Secondary | ICD-10-CM | POA: Diagnosis not present

## 2023-06-03 DIAGNOSIS — I25118 Atherosclerotic heart disease of native coronary artery with other forms of angina pectoris: Secondary | ICD-10-CM | POA: Diagnosis not present

## 2023-06-03 DIAGNOSIS — Z951 Presence of aortocoronary bypass graft: Secondary | ICD-10-CM

## 2023-06-03 DIAGNOSIS — I4891 Unspecified atrial fibrillation: Secondary | ICD-10-CM

## 2023-06-03 DIAGNOSIS — E782 Mixed hyperlipidemia: Secondary | ICD-10-CM

## 2023-06-03 DIAGNOSIS — I10 Essential (primary) hypertension: Secondary | ICD-10-CM

## 2023-06-03 DIAGNOSIS — I9789 Other postprocedural complications and disorders of the circulatory system, not elsewhere classified: Secondary | ICD-10-CM

## 2023-06-03 MED ORDER — FUROSEMIDE 40 MG PO TABS: 60.0000 mg | ORAL_TABLET | Freq: Every day | ORAL | 3 refills | Status: DC

## 2023-06-03 NOTE — Addendum Note (Signed)
Addended by: Roxanne Mins I on: 06/03/2023 04:55 PM   Modules accepted: Orders

## 2023-06-03 NOTE — Patient Instructions (Signed)
Medication Instructions:  Your physician has recommended you make the following change in your medication:   START: Furosemide 60 mg daily  *If you need a refill on your cardiac medications before your next appointment, please call your pharmacy*   Lab Work: None If you have labs (blood work) drawn today and your tests are completely normal, you will receive your results only by: MyChart Message (if you have MyChart) OR A paper copy in the mail If you have any lab test that is abnormal or we need to change your treatment, we will call you to review the results.   Testing/Procedures: None   Follow-Up: At [facility], you and your health needs are our priority.  As part of our continuing mission to provide you with exceptional heart care, we have created designated Provider Care Teams.  These Care Teams include your primary Cardiologist (physician) and Advanced Practice Providers (APPs -  Physician Assistants and Nurse Practitioners) who all work together to provide you with the care you need, when you need it.  We recommend signing up for the patient portal called "MyChart".  Sign up information is provided on this After Visit Summary.  MyChart is used to connect with patients for Virtual Visits (Telemedicine).  Patients are able to view lab/test results, encounter notes, upcoming appointments, etc.  Non-urgent messages can be sent to your provider as well.   To learn more about what you can do with MyChart, go to [email or web address].    Your next appointment:   1 year(s)  Provider:   Norman Herrlich, MD    Other Instructions None

## 2023-06-03 NOTE — Addendum Note (Signed)
Addended by: Roxanne Mins I on: 06/03/2023 05:08 PM   Modules accepted: Orders

## 2023-06-05 ENCOUNTER — Emergency Department (HOSPITAL_BASED_OUTPATIENT_CLINIC_OR_DEPARTMENT_OTHER)
Admission: EM | Admit: 2023-06-05 | Discharge: 2023-06-05 | Disposition: A | Discharge status: Home / Self Care | Payer: Medicare Other

## 2023-06-05 ENCOUNTER — Emergency Department (HOSPITAL_BASED_OUTPATIENT_CLINIC_OR_DEPARTMENT_OTHER): Payer: Medicare Other

## 2023-06-05 ENCOUNTER — Encounter (HOSPITAL_BASED_OUTPATIENT_CLINIC_OR_DEPARTMENT_OTHER): Payer: Self-pay

## 2023-06-05 ENCOUNTER — Other Ambulatory Visit: Payer: Self-pay

## 2023-06-05 DIAGNOSIS — Z7902 Long term (current) use of antithrombotics/antiplatelets: Secondary | ICD-10-CM | POA: Diagnosis not present

## 2023-06-05 DIAGNOSIS — Z7982 Long term (current) use of aspirin: Secondary | ICD-10-CM | POA: Insufficient documentation

## 2023-06-05 DIAGNOSIS — Z91040 Latex allergy status: Secondary | ICD-10-CM | POA: Insufficient documentation

## 2023-06-05 DIAGNOSIS — M546 Pain in thoracic spine: Secondary | ICD-10-CM | POA: Insufficient documentation

## 2023-06-05 MED ORDER — LIDOCAINE 4 % EX PTCH: 1.0000 | MEDICATED_PATCH | CUTANEOUS | 0 refills | Status: AC

## 2023-06-05 MED ORDER — IBUPROFEN 800 MG PO TABS
800.0000 mg | ORAL_TABLET | Freq: Once | ORAL | Status: AC
  Administered 2023-06-05: 800 mg via ORAL
  Filled 2023-06-05: qty 1

## 2023-06-05 MED ORDER — LIDOCAINE 5 % EX PTCH
1.0000 | MEDICATED_PATCH | Freq: Once | CUTANEOUS | Status: DC
  Administered 2023-06-05: 1 via TRANSDERMAL
  Filled 2023-06-05: qty 1

## 2023-06-05 NOTE — Discharge Instructions (Signed)
You may take Tylenol alternating with ibuprofen for pain.  We are prescribing you lidocaine patches that you may also use.  Please follow-up with your primary doctor.  Return immediately if you develop fevers, chills, chest pain, shortness of breath, unilateral weakness, numbness in your extremities or you develop any new or worsening symptoms that are concerning to you.

## 2023-06-05 NOTE — ED Provider Notes (Signed)
Care was taken over from Dr. Maple Hudson pending reading of chest x-ray.  He presents with 1 month history of worsening pain in his right upper back around the shoulder blade.  He does not have any radicular symptoms.  No pain going into his chest.  It does hurt a little bit more when he breathes but also hurts when he moves or twist.  No other symptoms that sound more concerning for aortic dissection or PE.  Chest x-ray does not show any acute abnormalities.  No pneumonia or pulmonary edema.  His pain is much better after treatment in the ED with ibuprofen and the Lidoderm patch.  Was discharged home in good condition.  Was given discharge instructions per Dr. Maple Hudson.  Return precautions were given.   Rolan Bucco, MD 06/05/23 1730

## 2023-06-05 NOTE — ED Triage Notes (Signed)
In for eval of mid back pain. Denies injury. Onset 1 month ago. Pain worse this am. Took 2 extra strength tylenol.

## 2023-06-05 NOTE — ED Provider Notes (Signed)
[place] [facility] AT [facility] Provider Note   CSN: [id number] Arrival date & time: 06/05/23  1224     History  Chief Complaint  Patient presents with   Back Pain    Craig Lawson is a 78 y.o. male.  This is a 78 year old male presenting emergency department for right mid back pain.  Symptoms started a month ago, no inciting event that he can recall.  Has been constant.  Awoke this morning with seemingly worsening symptoms.  No fevers, neurologic symptoms, not taking immunosuppressive's, no history of cancer.  No history of IV drug use.  No weakness in upper extremities.  Patient notes that 2 weeks ago did have cough and continues to have productive cough.  Took extra Tylenol this morning with some improvement of his symptoms.   Back Pain      Home Medications Prior to Admission medications   Medication Sig Start Date End Date Taking? Authorizing Provider  lidocaine 4 % Place 1 patch onto the skin daily for 10 days. 06/05/23 06/15/23 Yes Coral Spikes, DO  aspirin EC 81 MG tablet Take 81 mg by mouth at bedtime.    [provider]  Cholecalciferol 125 MCG (5000 UT) TABS Take 1 tablet by mouth daily.    [provider]  clopidogrel (PLAVIX) 75 MG tablet Take 1 tablet (75 mg total) by mouth at bedtime. 02/06/23   Baldo Daub, MD  ezetimibe (ZETIA) 10 MG tablet Take 10 mg by mouth at bedtime.    [provider]  furosemide (LASIX) 40 MG tablet Take 1.5 tablets (60 mg total) by mouth daily. 06/03/23   Baldo Daub, MD  losartan (COZAAR) 25 MG tablet Take 1 tablet by mouth daily. 02/27/23 02/27/24  [provider]  metoprolol tartrate (LOPRESSOR) 25 MG tablet Take 25 mg by mouth 2 (two) times daily. 05/28/23   [provider]  nitroGLYCERIN (NITROSTAT) 0.4 MG SL tablet Place 1 tablet (0.4 mg total) under the tongue every 5 (five) minutes as needed for chest pain. 08/11/22 11/09/22  Flossie Dibble, NP   rosuvastatin (CRESTOR) 20 MG tablet Take 1 tablet (20 mg total) by mouth daily. 07/23/22   Rowe Clack, PA-C      Allergies    Penicillins, Statins, Isosorbide, and Latex    Review of Systems   Review of Systems  Musculoskeletal:  Positive for back pain.    Physical Exam Updated Vital Signs BP (!) 148/77   Pulse (!) 51   Temp 97.9 F (36.6 C)   Resp 16   Ht 5\' 9"  (1.753 m)   Wt 117.4 kg   SpO2 98%   BMI 38.22 kg/m  Physical Exam Vitals and nursing note reviewed.  Constitutional:      General: He is not in acute distress.    Appearance: He is obese. He is not toxic-appearing.  HENT:     Head: Normocephalic.     Mouth/Throat:     Mouth: Mucous membranes are moist.  Eyes:     Conjunctiva/sclera: Conjunctivae normal.  Cardiovascular:     Rate and Rhythm: Normal rate and regular rhythm.  Pulmonary:     Effort: Pulmonary effort is normal.     Breath sounds: Normal breath sounds.  Abdominal:     General: Abdomen is flat. There is no distension.     Tenderness: There is no abdominal tenderness. There is no guarding or rebound.  Musculoskeletal:     Comments: Patient  has some tenderness along the inferior scapular border.  No midline spinal tenderness.  5 out of 5 bicep strength tricep strength.  Normal sensation.  Skin:    General: Skin is warm.     Capillary Refill: Capillary refill takes less than 2 seconds.  Neurological:     Mental Status: He is alert and oriented to person, place, and time.  Psychiatric:        Mood and Affect: Mood normal.        Behavior: Behavior normal.     ED Results / Procedures / Treatments   Labs (all labs ordered are listed, but only abnormal results are displayed) Labs Reviewed - No data to display  EKG None  Radiology No results found.  Procedures Procedures    Medications Ordered in ED Medications  ibuprofen (ADVIL) tablet 800 mg (800 mg Oral Given 06/05/23 1430)    ED Course/ Medical Decision Making/ A&P Clinical  Course as of 06/10/23 0707  Fri Jun 05, 2023  1536 Feeling improved with lidocaine patch and Motrin. [TY]  1536 DG Chest 2 View No overt pneumonia or osseous abnormality on my independent interpretation of x-ray.  Awaiting radiology official read. [TY]  Wed Jun 10, 2023  0707 Care signed out to afternoon team. Dispo pending official radiology read. [TY]    Clinical Course User Index [TY] Coral Spikes, DO                                 Medical Decision Making Is a 78 year old male presenting emergency department for mid back pain.  He is afebrile vital signs reassuring.  No red flags on history.  Does have advanced age however.  I suspect his symptoms are musculoskeletal as a are seemingly subacute.  He does also complain of a persistent cough.  Does not appear to be in respiratory distress, lungs largely clear and he is maintaining ox saturation on room air.  Will get chest x-ray to evaluate for pneumonia, should also give some indication of his thoracic spine as well.  However I suspect muscular has this tenderness seemingly is a muscle belly rather than osseous.  Treated with lidocaine patch and Motrin.  See ED course for final MDM/disposition.  Amount and/or Complexity of Data Reviewed Radiology: ordered. Decision-making details documented in ED Course.  Risk OTC drugs. Prescription drug management.         Final Clinical Impression(s) / ED Diagnoses Final diagnoses:  Right-sided thoracic back pain, unspecified chronicity    Rx / DC Orders ED Discharge Orders          Ordered    lidocaine 4 %  Every 24 hours        06/05/23 1428              Coral Spikes, [place] 06/10/23 [phone]

## 2023-08-01 ENCOUNTER — Other Ambulatory Visit: Payer: Self-pay | Admitting: Cardiology

## 2023-08-03 NOTE — Telephone Encounter (Signed)
 Prescription sent to pharmacy.

## 2023-11-27 ENCOUNTER — Telehealth: Payer: Self-pay | Admitting: *Deleted

## 2023-11-27 MED ORDER — FUROSEMIDE 40 MG PO TABS: 60.0000 mg | ORAL_TABLET | Freq: Every day | ORAL | 1 refills | Status: AC

## 2023-11-27 NOTE — Telephone Encounter (Signed)
 Furosemide  sent to [facility]

## 2024-01-10 ENCOUNTER — Other Ambulatory Visit: Payer: Self-pay | Admitting: Cardiology

## 2024-04-17 ENCOUNTER — Other Ambulatory Visit: Payer: Self-pay | Admitting: Cardiology

## 2024-04-25 ENCOUNTER — Other Ambulatory Visit: Payer: Self-pay | Admitting: Cardiology

## 2024-05-06 DIAGNOSIS — K219 Gastro-esophageal reflux disease without esophagitis: Secondary | ICD-10-CM | POA: Insufficient documentation

## 2024-05-30 ENCOUNTER — Ambulatory Visit: Admitting: Cardiology
